# Patient Record
Sex: Male | Born: 1950 | Race: White | Hispanic: No | Marital: Married | Smoking: Former smoker
Health system: Southern US, Community
[De-identification: ages and names within clinical notes are randomized; demographics above are authoritative.]

## PROBLEM LIST (undated history)

## (undated) DIAGNOSIS — C801 Malignant (primary) neoplasm, unspecified: Secondary | ICD-10-CM

## (undated) DIAGNOSIS — F329 Major depressive disorder, single episode, unspecified: Secondary | ICD-10-CM

## (undated) DIAGNOSIS — I4891 Unspecified atrial fibrillation: Secondary | ICD-10-CM

## (undated) DIAGNOSIS — I10 Essential (primary) hypertension: Secondary | ICD-10-CM

## (undated) DIAGNOSIS — F32A Depression, unspecified: Secondary | ICD-10-CM

## (undated) DIAGNOSIS — I499 Cardiac arrhythmia, unspecified: Secondary | ICD-10-CM

---
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
## 2017-02-15 ENCOUNTER — Ambulatory Visit (INDEPENDENT_AMBULATORY_CARE_PROVIDER_SITE_OTHER): Payer: BLUE CROSS/BLUE SHIELD | Admitting: Physician Assistant

## 2017-02-15 ENCOUNTER — Encounter: Payer: Self-pay | Admitting: Physician Assistant

## 2017-02-15 VITALS — BP 177/94 | HR 78 | Temp 98.3°F | Resp 16 | Ht 74.0 in | Wt 178.4 lb

## 2017-02-15 DIAGNOSIS — I10 Essential (primary) hypertension: Secondary | ICD-10-CM | POA: Diagnosis not present

## 2017-02-15 MED ORDER — AMLODIPINE BESYLATE 5 MG PO TABS: 5.0000 mg | ORAL_TABLET | Freq: Every day | ORAL | 0 refills | Status: DC

## 2017-02-15 NOTE — Progress Notes (Signed)
    02/15/2017 3:51 PM   DOB: [birth year] / MRN: [id number]  SUBJECTIVE:  Christopher Harding is a 66 y.o. male presenting for presenting for elevated blood pressure at the request of his dentist who advised he needed treatment Thinks that he has always had a blood pressure problem as an Urgent Care provider advised him that he may need medication. Has been smoking for about 45 years now at 1 pack daily.   He has No Known Allergies.   He  has no past medical history on file.    He  reports that he has been smoking.  He has been smoking about 1.00 pack per day. He has never used smokeless tobacco. He reports that he drinks alcohol. He reports that he does not use drugs. He  has no sexual activity history on file. The patient  has no past surgical history on file.  His family history is not on file.  Review of Systems  Respiratory: Negative for cough and shortness of breath.   Cardiovascular: Negative for chest pain.  Neurological: Negative for dizziness and headaches.    The problem list and medications were reviewed and updated by myself where necessary and exist elsewhere in the encounter.   OBJECTIVE:  BP (!) 177/94 (BP Location: Right Arm, Patient Position: Sitting, Cuff Size: Large)   Pulse 78   Temp 98.3 F (36.8 C) (Oral)   Resp 16   Ht 6\' 2"  (1.88 m)   Wt 178 lb 6.4 oz (80.9 kg)   SpO2 100%   BMI 22.91 kg/m   Physical Exam  Constitutional: He appears well-developed. He is active and cooperative.  Non-toxic appearance.  Cardiovascular: Normal rate, regular rhythm, S1 normal, S2 normal, normal heart sounds, intact distal pulses and normal pulses.  Exam reveals no gallop and no friction rub.   No murmur heard. Pulmonary/Chest: Effort normal. No stridor. No tachypnea. No respiratory distress. He has no wheezes. He has no rales.  Abdominal: He exhibits no distension.  Musculoskeletal: He exhibits no edema.  Neurological: He is alert.  Skin: Skin is warm and dry. He is not  diaphoretic. No pallor.  Psychiatric: His speech is normal and behavior is normal. Judgment and thought content normal. His mood appears anxious. Cognition and memory are normal.  Vitals reviewed.   No results found for this or any previous visit (from the past 72 hour(s)).  No results found.  ASSESSMENT AND PLAN:  Christopher Harding was seen today for hypertension.  Diagnoses and all orders for this visit:  Essential hypertension: Starting 5 of norvasc.  He will likely need a higher dose however this is certainly a good start.  Will see him back in late October for titration, sooner if he pressure is not coming down as his dentist will not let him proceed for a procedure without an improved pressure.  -     Hemoglobin A1c -     CBC -     CMP and Liver -     Lipid Panel -     TSH -     amLODipine (NORVASC) 5 MG tablet; Take 1 tablet (5 mg total) by mouth daily.    The patient is advised to call or return to clinic if he does not see an improvement in symptoms, or to seek the care of the closest emergency department if he worsens with the above plan.   Christopher Harding, MHS, PA-C [facility] 02/15/2017 3:51 PM

## 2017-02-15 NOTE — Patient Instructions (Signed)
     IF you received an x-ray today, you will receive an invoice from [facility]. Please contact [facility] at [phone] with questions or concerns regarding your invoice.   IF you received labwork today, you will receive an invoice from [facility]. Please contact [facility] at [phone] with questions or concerns regarding your invoice.   Our billing staff will not be able to assist you with questions regarding bills from these companies.  You will be contacted with the lab results as soon as they are available. The fastest way to get your results is to activate your My Chart account. Instructions are located on the last page of this paperwork. If you have not heard from us regarding the results in 2 weeks, please contact this office.     

## 2017-02-16 LAB — CBC
Hematocrit: 39.3 % (ref 37.5–51.0)
Hemoglobin: 13.8 g/dL (ref 13.0–17.7)
MCH: 35.3 pg — AB (ref 26.6–33.0)
MCHC: 35.1 g/dL (ref 31.5–35.7)
MCV: 101 fL — ABNORMAL HIGH (ref 79–97)
Platelets: 222 10*3/uL (ref 150–379)
RBC: 3.91 x10E6/uL — ABNORMAL LOW (ref 4.14–5.80)
RDW: 13.6 % (ref 12.3–15.4)
WBC: 6.2 10*3/uL (ref 3.4–10.8)

## 2017-02-16 LAB — CMP AND LIVER
ALT: 9 IU/L (ref 0–44)
AST: 23 IU/L (ref 0–40)
Albumin: 4.4 g/dL (ref 3.6–4.8)
Alkaline Phosphatase: 44 IU/L (ref 39–117)
BILIRUBIN TOTAL: 0.4 mg/dL (ref 0.0–1.2)
BILIRUBIN, DIRECT: 0.14 mg/dL (ref 0.00–0.40)
BUN: 7 mg/dL — AB (ref 8–27)
CALCIUM: 9.1 mg/dL (ref 8.6–10.2)
CHLORIDE: 95 mmol/L — AB (ref 96–106)
CO2: 22 mmol/L (ref 20–29)
Creatinine, Ser: 0.7 mg/dL — ABNORMAL LOW (ref 0.76–1.27)
GFR, EST AFRICAN AMERICAN: 114 mL/min/{1.73_m2} (ref >59)
GFR, EST NON AFRICAN AMERICAN: 98 mL/min/{1.73_m2} (ref >59)
GLUCOSE: 103 mg/dL — AB (ref 65–99)
Potassium: 4.4 mmol/L (ref 3.5–5.2)
Sodium: 134 mmol/L (ref 134–144)
Total Protein: 7.2 g/dL (ref 6.0–8.5)

## 2017-02-16 LAB — HEMOGLOBIN A1C
ESTIMATED AVERAGE GLUCOSE: 94 mg/dL
Hgb A1c MFr Bld: 4.9 % (ref 4.8–5.6)

## 2017-02-16 LAB — TSH: TSH: 1.46 u[IU]/mL (ref 0.450–4.500)

## 2017-02-16 LAB — LIPID PANEL
Chol/HDL Ratio: 1.4 ratio (ref 0.0–5.0)
Cholesterol, Total: 182 mg/dL (ref 100–199)
HDL: 127 mg/dL (ref >39)
LDL Calculated: 44 mg/dL (ref 0–99)
TRIGLYCERIDES: 56 mg/dL (ref 0–149)
VLDL Cholesterol Cal: 11 mg/dL (ref 5–40)

## 2017-04-04 ENCOUNTER — Ambulatory Visit (INDEPENDENT_AMBULATORY_CARE_PROVIDER_SITE_OTHER): Payer: BLUE CROSS/BLUE SHIELD | Admitting: Physician Assistant

## 2017-04-04 ENCOUNTER — Encounter: Payer: Self-pay | Admitting: Physician Assistant

## 2017-04-04 VITALS — BP 146/68 | HR 79 | Temp 98.6°F | Resp 16 | Ht 74.0 in | Wt 177.0 lb

## 2017-04-04 DIAGNOSIS — Z23 Encounter for immunization: Secondary | ICD-10-CM

## 2017-04-04 DIAGNOSIS — Z1211 Encounter for screening for malignant neoplasm of colon: Secondary | ICD-10-CM

## 2017-04-04 DIAGNOSIS — I10 Essential (primary) hypertension: Secondary | ICD-10-CM

## 2017-04-04 MED ORDER — AMLODIPINE BESY-BENAZEPRIL HCL 5-10 MG PO CAPS: 1.0000 | ORAL_CAPSULE | Freq: Every day | ORAL | 1 refills | Status: DC

## 2017-04-04 NOTE — Progress Notes (Signed)
04/04/2017 4:02 PM   DOB: July 27, 1950 / MRN: [id number]  SUBJECTIVE:  Christopher Harding is a 66 y.o. male presenting for BP recheck. Has been taking norvasc 5 mg and denies any new leg swelling.  Tells me his pressure is much better. He does not check it outside of medical appointments. He feels well today. Would like a flu shot today. Needs a tdap.     He has No Known Allergies.   He  has no past medical history on file.    He  reports that he has been smoking.  He has a 35.00 pack-year smoking history. He has never used smokeless tobacco. He reports that he drinks alcohol. He reports that he does not use drugs. He  has no sexual activity history on file. The patient  has no past surgical history on file.  His family history is not on file.  Review of Systems  Constitutional: Negative for chills, diaphoresis and fever.  Eyes: Negative.   Respiratory: Negative for cough, hemoptysis, sputum production, shortness of breath and wheezing.   Cardiovascular: Negative for chest pain, orthopnea and leg swelling.  Gastrointestinal: Negative for nausea.  Skin: Negative for rash.  Neurological: Negative for dizziness, sensory change, speech change, focal weakness and headaches.    The problem list and medications were reviewed and updated by myself where necessary and exist elsewhere in the encounter.   OBJECTIVE:  BP (!) 146/68 (BP Location: Right Arm, Patient Position: Sitting, Cuff Size: Normal)   Pulse 79   Temp 98.6 F (37 C) (Oral)   Resp 16   Ht 6\' 2"  (1.88 m)   Wt 177 lb (80.3 kg)   SpO2 99%   BMI 22.73 kg/m   Physical Exam  Constitutional: He is oriented to person, place, and time. He appears well-developed. He is active and cooperative.  Non-toxic appearance.  Cardiovascular: Normal rate, regular rhythm, S1 normal, S2 normal, normal heart sounds, intact distal pulses and normal pulses.  Exam reveals no gallop and no friction rub.   No murmur heard. Pulmonary/Chest:  Effort normal. No stridor. No tachypnea. No respiratory distress. He has no wheezes. He has no rales.  Abdominal: He exhibits no distension.  Musculoskeletal: He exhibits no edema.  Neurological: He is alert and oriented to person, place, and time.  Skin: Skin is warm and dry. He is not diaphoretic. No pallor.  Vitals reviewed.   BP Readings from Last 3 Encounters:  04/04/17 (!) 146/68  02/15/17 (!) 177/94   Lab Results  Component Value Date   WBC 6.2 02/15/2017   HGB 13.8 02/15/2017   HCT 39.3 02/15/2017   MCV 101 (H) 02/15/2017   PLT 222 02/15/2017    Lab Results  Component Value Date   CREATININE 0.70 (L) 02/15/2017   BUN 7 (L) 02/15/2017   NA 134 02/15/2017   K 4.4 02/15/2017   CL 95 (L) 02/15/2017   CO2 22 02/15/2017    Lab Results  Component Value Date   ALT 9 02/15/2017   AST 23 02/15/2017   ALKPHOS 44 02/15/2017   BILITOT 0.4 02/15/2017    Lab Results  Component Value Date   TSH 1.460 02/15/2017    Lab Results  Component Value Date   HGBA1C 4.9 02/15/2017    Lab Results  Component Value Date   CHOL 182 02/15/2017   HDL 127 02/15/2017   LDLCALC 44 02/15/2017   TRIG 56 02/15/2017   CHOLHDL 1.4 02/15/2017  No results found for this or any previous visit (from the past 72 hour(s)).  No results found.  ASSESSMENT AND PLAN:  Terell was seen today for hypertension.  Diagnoses and all orders for this visit:  Essential hypertension: Adding a low dose of a second agent.  Will see him back in 6 months.  Will plan to start pneumonia series at that time and will plan to discuss low dose CT scan screening at that time.  -     amLODipine-benazepril (LOTREL) 5-10 MG capsule; Take 1 capsule by mouth daily.  Special screening for malignant neoplasms, colon -     Cologuard  Need for prophylactic vaccination and inoculation against influenza -     Flu Vaccine QUAD 36+ mos IM  Need for diphtheria-tetanus-pertussis (Tdap) vaccine -     Tdap vaccine  greater than or equal to 7yo IM    The patient is advised to call or return to clinic if he does not see an improvement in symptoms, or to seek the care of the closest emergency department if he worsens with the above plan.   Philis Fendt, MHS, PA-C [facility] 04/04/2017 4:02 PM

## 2017-04-04 NOTE — Patient Instructions (Signed)
     IF you received an x-ray today, you will receive an invoice from [facility]. Please contact [facility] at [phone] with questions or concerns regarding your invoice.   IF you received labwork today, you will receive an invoice from [facility]. Please contact [facility] at [phone] with questions or concerns regarding your invoice.   Our billing staff will not be able to assist you with questions regarding bills from these companies.  You will be contacted with the lab results as soon as they are available. The fastest way to get your results is to activate your My Chart account. Instructions are located on the last page of this paperwork. If you have not heard from us regarding the results in 2 weeks, please contact this office.     

## 2017-10-03 ENCOUNTER — Encounter: Payer: Self-pay | Admitting: Physician Assistant

## 2017-10-03 ENCOUNTER — Ambulatory Visit (INDEPENDENT_AMBULATORY_CARE_PROVIDER_SITE_OTHER): Payer: BLUE CROSS/BLUE SHIELD | Admitting: Physician Assistant

## 2017-10-03 ENCOUNTER — Other Ambulatory Visit: Payer: Self-pay

## 2017-10-03 VITALS — BP 126/74 | HR 69 | Temp 98.1°F | Resp 16 | Ht 71.0 in | Wt 172.0 lb

## 2017-10-03 DIAGNOSIS — I10 Essential (primary) hypertension: Secondary | ICD-10-CM

## 2017-10-03 DIAGNOSIS — F17210 Nicotine dependence, cigarettes, uncomplicated: Secondary | ICD-10-CM | POA: Diagnosis not present

## 2017-10-03 DIAGNOSIS — Z23 Encounter for immunization: Secondary | ICD-10-CM

## 2017-10-03 MED ORDER — AMLODIPINE BESY-BENAZEPRIL HCL 2.5-10 MG PO CAPS
1.0000 | ORAL_CAPSULE | Freq: Every day | ORAL | 0 refills | Status: DC
Start: 2017-10-03 — End: 2017-10-25

## 2017-10-03 NOTE — Patient Instructions (Addendum)
  BP Readings from Last 3 Encounters:  10/03/17 126/74  04/04/17 (!) 146/68  02/15/17 (!) 177/94      IF you received an x-ray today, you will receive an invoice from [facility]. Please contact [facility] at [phone] with questions or concerns regarding your invoice.   IF you received labwork today, you will receive an invoice from [facility]. Please contact [facility] at [phone] with questions or concerns regarding your invoice.   Our billing staff will not be able to assist you with questions regarding bills from these companies.  You will be contacted with the lab results as soon as they are available. The fastest way to get your results is to activate your My Chart account. Instructions are located on the last page of this paperwork. If you have not heard from [place] regarding the results in 2 weeks, please contact this office.

## 2017-10-03 NOTE — Progress Notes (Signed)
10/05/2017 8:11 AM   DOB: [birth year] / MRN: [id number]  SUBJECTIVE:  Christopher Harding is a 67 y.o. male presenting for follow-up of hypertension.  He is taking the medication daily and does not miss doses however states that "I am constantly exhausted."  He is retired, enjoys spending time with his family, sleeps well, and feels happy.  Denies any financial concerns.  He is worried his fatigue may represent an occult process but also tells me that the fatigue really started once we increased his blood pressure medicine.  He would like to try reducing the dose and following up in about 3 weeks to recheck his numbers and recheck the fatigue.  Immunization History  Administered Date(s) Administered  . Influenza,inj,Quad PF,6+ Mos 04/04/2017  . Pneumococcal Conjugate-13 10/03/2017  . Tdap 04/04/2017     He has No Known Allergies.   He  has no past medical history on file.    He  reports that he has been smoking.  He has a 35.00 pack-year smoking history. He has never used smokeless tobacco. He reports that he drinks alcohol. He reports that he does not use drugs. He  has no sexual activity history on file. The patient  has no past surgical history on file.  His family history is not on file.  Review of Systems  Constitutional: Positive for malaise/fatigue. Negative for chills, diaphoresis and fever.  Eyes: Negative.   Respiratory: Negative for cough, hemoptysis, sputum production, shortness of breath and wheezing.   Cardiovascular: Negative for chest pain, orthopnea and leg swelling.  Gastrointestinal: Negative for abdominal pain, blood in stool, constipation, diarrhea, heartburn, melena, nausea and vomiting.  Genitourinary: Negative for dysuria, flank pain, frequency, hematuria and urgency.  Skin: Negative for rash.  Neurological: Negative for dizziness, sensory change, speech change, focal weakness and headaches.    The problem list and medications were reviewed and updated by myself  where necessary and exist elsewhere in the encounter.   OBJECTIVE:  BP 126/74 (BP Location: Left Arm, Patient Position: Sitting, Cuff Size: Normal)   Pulse 69   Temp 98.1 F (36.7 C) (Oral)   Resp 16   Ht 5\' 11"  (1.803 m)   Wt 172 lb (78 kg)   SpO2 100%   BMI 23.99 kg/m   Wt Readings from Last 3 Encounters:  10/03/17 172 lb (78 kg)  04/04/17 177 lb (80.3 kg)  02/15/17 178 lb 6.4 oz (80.9 kg)     Physical Exam  Constitutional: He is oriented to person, place, and time. He appears well-developed. He does not appear ill.  Eyes: Pupils are equal, round, and reactive to light. Conjunctivae and EOM are normal.  Cardiovascular: Normal rate, regular rhythm, S1 normal, S2 normal, normal heart sounds, intact distal pulses and normal pulses. Exam reveals no gallop and no friction rub.  No murmur heard. Pulmonary/Chest: Effort normal. No stridor. No respiratory distress. He has no wheezes. He has no rales.  Abdominal: Soft. Normal appearance and bowel sounds are normal. He exhibits no distension and no mass. There is no tenderness. There is no rigidity, no rebound, no guarding and no CVA tenderness. No hernia.  Musculoskeletal: Normal range of motion. He exhibits no edema.  Neurological: He is alert and oriented to person, place, and time. No cranial nerve deficit. Coordination normal.  Skin: Skin is warm and dry. He is not diaphoretic.  Psychiatric: He has a normal mood and affect.  Nursing note and vitals reviewed.   Lab Results  Component Value Date   WBC 6.2 02/15/2017   HGB 13.8 02/15/2017   HCT 39.3 02/15/2017   MCV 101 (H) 02/15/2017   PLT 222 02/15/2017    Lab Results  Component Value Date   CREATININE 0.74 (L) 10/03/2017   BUN 7 (L) 10/03/2017   NA 134 10/03/2017   K 4.3 10/03/2017   CL 99 10/03/2017   CO2 22 10/03/2017    Lab Results  Component Value Date   ALT 9 02/15/2017   AST 23 02/15/2017   ALKPHOS 44 02/15/2017   BILITOT 0.4 02/15/2017    Lab Results   Component Value Date   TSH 1.460 02/15/2017    Lab Results  Component Value Date   HGBA1C 4.9 02/15/2017    Lab Results  Component Value Date   CHOL 182 02/15/2017   HDL 127 02/15/2017   LDLCALC 44 02/15/2017   TRIG 56 02/15/2017   CHOLHDL 1.4 02/15/2017     Results for orders placed or performed in visit on 10/03/17 (from the past 72 hour(s))  Renal Function Panel     Status: Abnormal   Collection Time: 10/03/17  4:50 PM  Result Value Ref Range   Glucose 94 65 - 99 mg/dL   BUN 7 (L) 8 - 27 mg/dL   Creatinine, Ser 0.74 (L) 0.76 - 1.27 mg/dL   GFR calc non Af Amer 95 >59 mL/min/1.73   GFR calc Af Amer 110 >59 mL/min/1.73   BUN/Creatinine Ratio 9 (L) 10 - 24   Sodium 134 134 - 144 mmol/L   Potassium 4.3 3.5 - 5.2 mmol/L   Chloride 99 96 - 106 mmol/L   CO2 22 20 - 29 mmol/L   Calcium 9.4 8.6 - 10.2 mg/dL   Phosphorus 3.6 2.5 - 4.5 mg/dL   Albumin 4.4 3.6 - 4.8 g/dL    No results found.  ASSESSMENT AND PLAN:  Christopher Harding was seen today for hypertension.  Diagnoses and all orders for this visit:  Well-controlled hypertension: Patient suffering with some daily fatigue which very well could be medication related.  I will drop his Lotrel to half of the current dose.  He will come back in about 3 weeks for a recheck of blood pressure and fatigue levels.  EKG today is reassuring and normal sinus rhythm with no major abnormality.  I have ordered his screenings that are related to his long history of smoking today.  I would like these to occur on the same day if possible for his convenience. -     Cologuard -     EKG 12-Lead -     amlodipine-benazepril (LOTREL) 2.5-10 MG capsule; Take 1 capsule by mouth daily. -     Renal Function Panel  Smokes less than 1 pack a day with greater than 30 pack year history -     CT Chest Wo Contrast; Future -     US ABDOMINAL AORTA SCREENING AAA; Future  Need for prophylactic vaccination against Streptococcus pneumoniae (pneumococcus) -      Pneumococcal conjugate vaccine 13-valent IM    The patient is advised to call or return to clinic if he does not see an improvement in symptoms, or to seek the care of the closest emergency department if he worsens with the above plan.   Philis Fendt, MHS, PA-C [facility] 10/05/2017 8:11 AM

## 2017-10-04 LAB — RENAL FUNCTION PANEL
ALBUMIN: 4.4 g/dL (ref 3.6–4.8)
BUN / CREAT RATIO: 9 — AB (ref 10–24)
BUN: 7 mg/dL — ABNORMAL LOW (ref 8–27)
CO2: 22 mmol/L (ref 20–29)
CREATININE: 0.74 mg/dL — AB (ref 0.76–1.27)
Calcium: 9.4 mg/dL (ref 8.6–10.2)
Chloride: 99 mmol/L (ref 96–106)
GFR calc non Af Amer: 95 mL/min/{1.73_m2} (ref >59)
GFR, EST AFRICAN AMERICAN: 110 mL/min/{1.73_m2} (ref >59)
Glucose: 94 mg/dL (ref 65–99)
Phosphorus: 3.6 mg/dL (ref 2.5–4.5)
Potassium: 4.3 mmol/L (ref 3.5–5.2)
Sodium: 134 mmol/L (ref 134–144)

## 2017-10-08 ENCOUNTER — Other Ambulatory Visit: Payer: Self-pay | Admitting: Physician Assistant

## 2017-10-08 DIAGNOSIS — I10 Essential (primary) hypertension: Secondary | ICD-10-CM

## 2017-10-12 ENCOUNTER — Telehealth: Payer: Self-pay | Admitting: Physician Assistant

## 2017-10-12 NOTE — Telephone Encounter (Signed)
The cpt code for the ct exam needs to be changed to G0297 for lung cancer screening instead of 12162   Thank you

## 2017-10-12 NOTE — Telephone Encounter (Signed)
Eddie Dibbles can you please attend to this. I don't place CPT codes.  I tried to change the study to the specified however that did not help. Philis Fendt, MS, PA-C 11:15 AM, 10/12/2017

## 2017-10-24 ENCOUNTER — Ambulatory Visit (INDEPENDENT_AMBULATORY_CARE_PROVIDER_SITE_OTHER): Payer: BLUE CROSS/BLUE SHIELD | Admitting: Physician Assistant

## 2017-10-24 ENCOUNTER — Telehealth: Payer: Self-pay | Admitting: Physician Assistant

## 2017-10-24 ENCOUNTER — Encounter: Payer: Self-pay | Admitting: Physician Assistant

## 2017-10-24 ENCOUNTER — Other Ambulatory Visit: Payer: Self-pay

## 2017-10-24 DIAGNOSIS — I10 Essential (primary) hypertension: Secondary | ICD-10-CM | POA: Diagnosis not present

## 2017-10-24 NOTE — Progress Notes (Signed)
10/25/2017 4:46 PM   DOB: Aug 29, 1950 / MRN: [id number]  SUBJECTIVE:  Christopher Harding is a 67 y.o. male presenting for recheck HTN.  He feels well today.  Lowering his BP dose has relieved 60-70% of his fatigue.  No chest pain, SOB, leg swelling.   He has No Known Allergies.   He  has no past medical history on file.    He  reports that he has been smoking.  He has a 35.00 pack-year smoking history. He has never used smokeless tobacco. He reports that he drinks alcohol. He reports that he does not use drugs. He  has no sexual activity history on file. The patient  has no past surgical history on file.  His family history is not on file.  Review of Systems  Constitutional: Negative for chills, diaphoresis and fever.  Respiratory: Negative for shortness of breath.   Cardiovascular: Negative for chest pain, orthopnea and leg swelling.  Gastrointestinal: Negative for nausea.  Skin: Negative for rash.  Neurological: Negative for dizziness.    The problem list and medications were reviewed and updated by myself where necessary and exist elsewhere in the encounter.   OBJECTIVE:  BP 138/68   Pulse 85   Temp 98.6 F (37 C)   Resp 16   Ht 5\' 11"  (1.803 m)   Wt 173 lb 6.4 oz (78.7 kg)   SpO2 97%   BMI 24.18 kg/m   Lab Results  Component Value Date   WBC 6.2 02/15/2017   HGB 13.8 02/15/2017   HCT 39.3 02/15/2017   MCV 101 (H) 02/15/2017   PLT 222 02/15/2017    Lab Results  Component Value Date   CREATININE 0.74 (L) 10/03/2017   BUN 7 (L) 10/03/2017   NA 134 10/03/2017   K 4.3 10/03/2017   CL 99 10/03/2017   CO2 22 10/03/2017    Lab Results  Component Value Date   ALT 9 02/15/2017   AST 23 02/15/2017   ALKPHOS 44 02/15/2017   BILITOT 0.4 02/15/2017    Lab Results  Component Value Date   TSH 1.460 02/15/2017    Lab Results  Component Value Date   HGBA1C 4.9 02/15/2017    Lab Results  Component Value Date   CHOL 182 02/15/2017   HDL 127 02/15/2017   LDLCALC 44 02/15/2017   TRIG 56 02/15/2017   CHOLHDL 1.4 02/15/2017     Physical Exam  Constitutional: He is oriented to person, place, and time. He appears well-developed. He is active.  Non-toxic appearance. He does not appear ill.  Eyes: Pupils are equal, round, and reactive to light. Conjunctivae and EOM are normal.  Cardiovascular: Normal rate, regular rhythm, S1 normal, S2 normal, normal heart sounds, intact distal pulses and normal pulses. Exam reveals no gallop and no friction rub.  No murmur heard. Pulmonary/Chest: Effort normal. No stridor. No respiratory distress. He has no wheezes. He has no rales.  Abdominal: He exhibits no distension.  Musculoskeletal: Normal range of motion. He exhibits no edema.  Neurological: He is alert and oriented to person, place, and time. No cranial nerve deficit. Coordination normal.  Skin: Skin is warm and dry. He is not diaphoretic. No pallor.  Psychiatric: He has a normal mood and affect.  Nursing note and vitals reviewed.   No results found for this or any previous visit (from the past 72 hour(s)).  No results found.  ASSESSMENT AND PLAN:  Thomes was seen today for follow-up.  Diagnoses  and all orders for this visit:  Well-controlled hypertension: Patient with improved symptoms of fatigue with lowering his dose of BP med to the current.  Will maintain lower dose.  He will come back for a recheck in about 6 months.  -     amlodipine-benazepril (LOTREL) 2.5-10 MG capsule; Take 1 capsule by mouth daily.    The patient is advised to call or return to clinic if he does not see an improvement in symptoms, or to seek the care of the closest emergency department if he worsens with the above plan.   Philis Fendt, MHS, PA-C [facility] 10/25/2017 4:46 PM

## 2017-10-24 NOTE — Patient Instructions (Signed)
     IF you received an x-ray today, you will receive an invoice from [facility]. Please contact [facility] at [phone] with questions or concerns regarding your invoice.   IF you received labwork today, you will receive an invoice from [facility]. Please contact [facility] at [phone] with questions or concerns regarding your invoice.   Our billing staff will not be able to assist you with questions regarding bills from these companies.  You will be contacted with the lab results as soon as they are available. The fastest way to get your results is to activate your My Chart account. Instructions are located on the last page of this paperwork. If you have not heard from us regarding the results in 2 weeks, please contact this office.     

## 2017-10-24 NOTE — Telephone Encounter (Signed)
Copied from [place]. Topic: Quick Communication - Rx Refill/Question >> Oct 24, 2017  4:04 PM Margot Ables wrote: Medication: amlodipine - pt requesting 90 day supply to be sent in - he met with Philis Fendt, PA today Preferred Pharmacy (with phone number or street name): [facility] [place], [place] AT [address] [phone] (Phone) [phone] (Fax)

## 2017-10-24 NOTE — Progress Notes (Deleted)
BP Readings from Last 3 Encounters:  10/24/17 (!) 148/68  10/03/17 126/74  04/04/17 (!) 146/68

## 2017-10-25 MED ORDER — AMLODIPINE BESY-BENAZEPRIL HCL 2.5-10 MG PO CAPS: 1.0000 | ORAL_CAPSULE | Freq: Every day | ORAL | 3 refills | Status: DC

## 2017-10-25 NOTE — Telephone Encounter (Signed)
I've reordered. Thanks John. Philis Fendt, MS, PA-C 5:19 PM, 10/25/2017

## 2017-10-25 NOTE — Telephone Encounter (Signed)
Note incomplete from [place] please advise on refill.

## 2017-10-26 ENCOUNTER — Ambulatory Visit: Payer: Self-pay

## 2017-11-08 ENCOUNTER — Ambulatory Visit: Payer: Self-pay

## 2017-11-08 ENCOUNTER — Inpatient Hospital Stay
Admission: RE | Admit: 2017-11-08 | Discharge: 2017-11-08 | Disposition: A | Discharge status: Home / Self Care | Payer: Self-pay | Source: Ambulatory Visit | Attending: Physician Assistant | Admitting: Physician Assistant

## 2017-11-19 ENCOUNTER — Telehealth: Payer: Self-pay | Admitting: Family Medicine

## 2017-11-19 NOTE — Telephone Encounter (Signed)
LDCT for lung cancer screening has been approved through [employer] effective 10/12/17-01/10/18  Authorization: [id number] Reference: [id number]

## 2018-01-25 ENCOUNTER — Other Ambulatory Visit: Payer: Self-pay

## 2018-01-25 ENCOUNTER — Encounter: Payer: Self-pay | Admitting: Physician Assistant

## 2018-01-25 ENCOUNTER — Ambulatory Visit (INDEPENDENT_AMBULATORY_CARE_PROVIDER_SITE_OTHER): Payer: BLUE CROSS/BLUE SHIELD | Admitting: Physician Assistant

## 2018-01-25 VITALS — BP 138/78 | HR 66 | Temp 98.2°F | Resp 16 | Ht 74.02 in | Wt 173.0 lb

## 2018-01-25 DIAGNOSIS — I10 Essential (primary) hypertension: Secondary | ICD-10-CM | POA: Diagnosis not present

## 2018-01-25 DIAGNOSIS — D7589 Other specified diseases of blood and blood-forming organs: Secondary | ICD-10-CM

## 2018-01-25 DIAGNOSIS — Z1211 Encounter for screening for malignant neoplasm of colon: Secondary | ICD-10-CM | POA: Diagnosis not present

## 2018-01-25 NOTE — Progress Notes (Signed)
01/25/2018 4:25 PM   DOB: Feb 01, 1951 / MRN: [id number]  SUBJECTIVE:  Christopher Harding is a 67 y.o. male presenting for recheck HTN. He takes medication daily without missing doses. He feels well today but is upset because he needs cataract surgery bilaterally. He continues to smoke and has no desire to quit.   He has No Known Allergies.   He  has no past medical history on file.    He  reports that he has been smoking. He has a 35.00 pack-year smoking history. He has never used smokeless tobacco. He reports that he drinks alcohol. He reports that he does not use drugs. He  has no sexual activity history on file. The patient  has no past surgical history on file.  His family history is not on file.  Review of Systems  Constitutional: Negative for chills, diaphoresis and fever.  Eyes: Negative.   Respiratory: Negative for cough, hemoptysis, sputum production, shortness of breath and wheezing.   Cardiovascular: Negative for chest pain, orthopnea and leg swelling.  Gastrointestinal: Negative for nausea.  Skin: Negative for rash.  Neurological: Negative for dizziness, sensory change, speech change, focal weakness and headaches.    The problem list and medications were reviewed and updated by myself where necessary and exist elsewhere in the encounter.   OBJECTIVE:  BP 138/78   Pulse 66   Temp 98.2 F (36.8 C) (Oral)   Resp 16   Ht 6' 2.02" (1.88 m)   Wt 173 lb (78.5 kg)   SpO2 100%   BMI 22.20 kg/m   Wt Readings from Last 3 Encounters:  01/25/18 173 lb (78.5 kg)  10/24/17 173 lb 6.4 oz (78.7 kg)  10/03/17 172 lb (78 kg)   Temp Readings from Last 3 Encounters:  01/25/18 98.2 F (36.8 C) (Oral)  10/24/17 98.6 F (37 C)  10/03/17 98.1 F (36.7 C) (Oral)   BP Readings from Last 3 Encounters:  01/25/18 138/78  10/24/17 138/68  10/03/17 126/74   Pulse Readings from Last 3 Encounters:  01/25/18 66  10/24/17 85  10/03/17 69    Physical Exam  Constitutional: He is  oriented to person, place, and time. He appears well-developed. He does not appear ill.  Eyes: Pupils are equal, round, and reactive to light. Conjunctivae and EOM are normal.  Cardiovascular: Normal rate, regular rhythm, S1 normal, S2 normal, normal heart sounds, intact distal pulses and normal pulses. Exam reveals no gallop and no friction rub.  No murmur heard. Pulmonary/Chest: Effort normal. No stridor. No respiratory distress. He has no wheezes. He has no rales.  Abdominal: He exhibits no distension.  Musculoskeletal: Normal range of motion. He exhibits no edema.  Neurological: He is alert and oriented to person, place, and time. No cranial nerve deficit. Coordination normal.  Skin: Skin is warm and dry. He is not diaphoretic.  Psychiatric: He has a normal mood and affect.  Nursing note and vitals reviewed.   Lab Results  Component Value Date   HGBA1C 4.9 02/15/2017    Lab Results  Component Value Date   WBC 6.2 02/15/2017   HGB 13.8 02/15/2017   HCT 39.3 02/15/2017   MCV 101 (H) 02/15/2017   PLT 222 02/15/2017    Lab Results  Component Value Date   CREATININE 0.74 (L) 10/03/2017   BUN 7 (L) 10/03/2017   NA 134 10/03/2017   K 4.3 10/03/2017   CL 99 10/03/2017   CO2 22 10/03/2017    Lab Results  Component Value Date   ALT 9 02/15/2017   AST 23 02/15/2017   ALKPHOS 44 02/15/2017   BILITOT 0.4 02/15/2017    Lab Results  Component Value Date   TSH 1.460 02/15/2017    Lab Results  Component Value Date   CHOL 182 02/15/2017   HDL 127 02/15/2017   LDLCALC 44 02/15/2017   TRIG 56 02/15/2017   CHOLHDL 1.4 02/15/2017     ASSESSMENT AND PLAN:  Christopher Harding was seen today for hypertension.  Diagnoses and all orders for this visit:  Well-controlled hypertension: RTC in about 6 months to see Dr. Carlota Raspberry.  -     CBC  Special screening for malignant neoplasms, colon -     Cologuard  Macrocytosis without anemia -     Pathologist smear review -     Vitamin B12 -      Folate    The patient is advised to call or return to clinic if he does not see an improvement in symptoms, or to seek the care of the closest emergency department if he worsens with the above plan.   Philis Fendt, MHS, PA-C [facility] 01/25/2018 4:25 PM

## 2018-01-25 NOTE — Patient Instructions (Addendum)
  You have enough medication to last at least six anouther 9 months.  Please come back and see Dr. Carlota Raspberry in about 6 months.    If you have lab work done today you will be contacted with your lab results within the next 2 weeks.  If you have not heard from [place] then please contact us. The fastest way to get your results is to register for My Chart.   IF you received an x-ray today, you will receive an invoice from [facility]. Please contact [facility] at [phone] with questions or concerns regarding your invoice.   IF you received labwork today, you will receive an invoice from [place]. Please contact [facility] at [phone] with questions or concerns regarding your invoice.   Our billing staff will not be able to assist you with questions regarding bills from these companies.  You will be contacted with the lab results as soon as they are available. The fastest way to get your results is to activate your My Chart account. Instructions are located on the last page of this paperwork. If you have not heard from [place] regarding the results in 2 weeks, please contact this office.

## 2018-01-26 LAB — CBC
HEMATOCRIT: 40.3 % (ref 37.5–51.0)
HEMOGLOBIN: 14.3 g/dL (ref 13.0–17.7)
MCH: 35.2 pg — ABNORMAL HIGH (ref 26.6–33.0)
MCHC: 35.5 g/dL (ref 31.5–35.7)
MCV: 99 fL — ABNORMAL HIGH (ref 79–97)
Platelets: 241 10*3/uL (ref 150–450)
RBC: 4.06 x10E6/uL — AB (ref 4.14–5.80)
RDW: 12.5 % (ref 12.3–15.4)
WBC: 5.8 10*3/uL (ref 3.4–10.8)

## 2018-01-26 LAB — VITAMIN B12: Vitamin B-12: 475 pg/mL (ref 232–1245)

## 2018-01-26 LAB — FOLATE: FOLATE: 16 ng/mL (ref >3.0)

## 2018-01-31 LAB — PATHOLOGIST SMEAR REVIEW
BASOS ABS: 0 10*3/uL (ref 0.0–0.2)
Basos: 1 %
EOS (ABSOLUTE): 0 10*3/uL (ref 0.0–0.4)
Eos: 1 %
HEMATOCRIT: 41 % (ref 37.5–51.0)
HEMOGLOBIN: 14.1 g/dL (ref 13.0–17.7)
IMMATURE GRANS (ABS): 0 10*3/uL (ref 0.0–0.1)
IMMATURE GRANULOCYTES: 0 %
LYMPHS ABS: 1.5 10*3/uL (ref 0.7–3.1)
LYMPHS: 24 %
MCH: 34.9 pg — ABNORMAL HIGH (ref 26.6–33.0)
MCHC: 34.4 g/dL (ref 31.5–35.7)
MCV: 102 fL — AB (ref 79–97)
Monocytes Absolute: 0.8 10*3/uL (ref 0.1–0.9)
Monocytes: 13 %
NEUTROS PCT: 61 %
Neutrophils Absolute: 3.8 10*3/uL (ref 1.4–7.0)
PLATELETS: 244 10*3/uL (ref 150–450)
Path Rev PLTs: NORMAL
Path Rev WBC: NORMAL
RBC: 4.04 x10E6/uL — ABNORMAL LOW (ref 4.14–5.80)
RDW: 13.5 % (ref 12.3–15.4)
WBC: 6.2 10*3/uL (ref 3.4–10.8)

## 2018-05-18 ENCOUNTER — Telehealth: Payer: Self-pay | Admitting: Family Medicine

## 2018-05-18 NOTE — Telephone Encounter (Signed)
Called and spoke with pt regarding their appt with Dr. Carlota Raspberry on 08/01/18. Due to providers schedule change, pt will need to be rescheduled. Pt was unable to reschedule due to being at a job site. Pt states that he will call back to reschedule. Thank you!

## 2018-08-01 ENCOUNTER — Ambulatory Visit: Payer: BLUE CROSS/BLUE SHIELD | Admitting: Family Medicine

## 2018-10-13 ENCOUNTER — Telehealth: Payer: Self-pay | Admitting: Cardiology

## 2018-10-13 ENCOUNTER — Telehealth: Payer: Self-pay | Admitting: General Practice

## 2018-10-13 NOTE — Telephone Encounter (Signed)
LMTCB to schedule new patient appt with Dr. Percival Spanish

## 2018-10-13 NOTE — Telephone Encounter (Signed)
Lmtcb. New patient.Referral in workqueue.  Please schedule with Dr Percival Spanish week of May 13 possible morning of 16 if possible. Thanks/dc

## 2018-10-25 NOTE — Telephone Encounter (Signed)
LMTCB to schedule new patient appt with Dr. Percival Spanish, offer patient virtual visit for next week.

## 2018-11-01 ENCOUNTER — Telehealth: Payer: Self-pay | Admitting: *Deleted

## 2018-11-01 ENCOUNTER — Other Ambulatory Visit: Payer: Self-pay | Admitting: Surgery

## 2018-11-01 NOTE — Telephone Encounter (Signed)
   [facility] Pre-operative Risk Assessment    Request for surgical clearance:  1. What type of surgery is being performed? Inguinal hernia repair  2. When is this surgery scheduled? TBD  3. What type of clearance is required (medical clearance vs. Pharmacy clearance to hold med vs. Both)? both  4. Are there any medications that need to be held prior to surgery and how long?xarelto-please advise  5. Practice name and name of physician performing surgery? Douglas blackman md  6. What is your office phone number [phone]   7.   What is your office fax number  [phone] attn chemira jones cma  8.   Anesthesia type (None, local, MAC, general) ? general   Christopher Harding 11/01/2018, 2:49 PM  _________________________________________________________________   (provider comments below)

## 2018-11-01 NOTE — Telephone Encounter (Signed)
   Primary Cardiologist: will be Dr. Percival Spanish  Chart reviewed as part of pre-operative protocol coverage.   It does not appear that [facility] has every seen this patient. It appears that he is scheduled for a new patient visit with Dr. Percival Spanish in July.  I attempted to call the patient and left a voicemail to relay that we can't provide surgical clearance until after his surgery.   Dr. Percival Spanish - please comment on anticoagulation and send clearance after his appt.   Pre-op covering staff: - Please contact requesting surgeon's office via preferred method (i.e, phone, fax) to inform them of the above   Ledora Bottcher, [place] 11/01/2018, 3:40 PM

## 2018-11-05 NOTE — Telephone Encounter (Signed)
If he needs a sooner appt he can be set up with another provider.

## 2018-11-07 NOTE — Telephone Encounter (Signed)
Pt have appt with Dr Gwenlyn Found 06/18

## 2018-11-17 ENCOUNTER — Telehealth: Payer: Self-pay | Admitting: Cardiovascular Disease

## 2018-11-20 ENCOUNTER — Telehealth: Payer: Self-pay | Admitting: Cardiovascular Disease

## 2018-11-20 NOTE — Telephone Encounter (Signed)
smartphone/ consent/ my chart declined/ pre reg completed

## 2018-11-23 ENCOUNTER — Telehealth (INDEPENDENT_AMBULATORY_CARE_PROVIDER_SITE_OTHER): Payer: 59 | Admitting: Cardiovascular Disease

## 2018-11-23 ENCOUNTER — Telehealth: Payer: Self-pay

## 2018-11-23 ENCOUNTER — Telehealth: Payer: Self-pay | Admitting: Cardiovascular Disease

## 2018-11-23 VITALS — Ht 74.0 in | Wt 166.0 lb

## 2018-11-23 DIAGNOSIS — I4819 Other persistent atrial fibrillation: Secondary | ICD-10-CM

## 2018-11-23 DIAGNOSIS — F1010 Alcohol abuse, uncomplicated: Secondary | ICD-10-CM | POA: Insufficient documentation

## 2018-11-23 DIAGNOSIS — Z720 Tobacco use: Secondary | ICD-10-CM

## 2018-11-23 DIAGNOSIS — I10 Essential (primary) hypertension: Secondary | ICD-10-CM | POA: Diagnosis not present

## 2018-11-23 DIAGNOSIS — Z01812 Encounter for preprocedural laboratory examination: Secondary | ICD-10-CM

## 2018-11-23 HISTORY — DX: Tobacco use: Z72.0

## 2018-11-23 HISTORY — DX: Other persistent atrial fibrillation: I48.19

## 2018-11-23 NOTE — Patient Instructions (Addendum)
Dear Christopher Harding, You are scheduled for a Cardioversion on Thursday December 07, 2018 with Dr. Jenkins Rouge.  Please arrive at the [facility] (Main Entrance A) at [facility]: [address] [place], [place] [address] at 12:45 pm. (1 hour prior to procedure unless lab work is needed; if lab work is needed arrive 1.5 hours ahead)  DIET: Nothing to eat or drink after midnight except a sip of water with medications (see medication instructions below)  Medication Instructions: Hold metoprolol (Lopressor) on the day of your cardioversion.  Continue your anticoagulant: Xarelto You will need to continue your anticoagulant after your procedure until you  are told by your Provider that it is safe to stop   Labs: You will need lab work 3-7 days prior to your cardioversion. You will need a COVID-19 test 3 days prior to your cardioversion.  Go to:  HeartCare at [employer] [facility] [address], [place], [place] [address] FOR YOUR BASIC METABOLIC PANEL and COMPLETE BLOOD COUNT LAB WORK. NO APPOINTMENT IS NEEDED. YOU MUST HAVE THIS LAB WORK DONE BEFORE GOING TO GET YOUR COVID-19 TEST DONE BECAUSE YOU WILL NEED TO QUARANTINE YOURSELF AFTER THE COVID-19 TEST UNTIL THE DAY OF YOUR [place].  Go to: [facility]  [address] [place], [place] [address] FOR YOUR COVID-19 TEST. YOU MUST HAVE YOUR COVID-19 TEST COMPLETED 3 DAYS PRIOR TO YOUR UPCOMING PROCEDURE/TEST. YOUR APPOINTMENT IS ON 12/04/2018 AT 10:30AM. YOU WILL ALSO NEED TO QUARANTINE YOURSELF AFTER THE COVID-19 TEST UNTIL THE DAY OF YOUR PROCEDURE/TEST. RESULTS WILL  BE POSTED IN OUR SYSTEM IN 48 HOURS OR LESS.    You must have a responsible person to drive you home and wait for you at the hospital during your procedure. Failure to do so could result in cancellation.  Bring your insurance cards.  *Special Note: Every effort is made to have your procedure done on time. Occasionally  there are emergencies that occur at the hospital that may cause delays. Please be patient if a delay does occur.    Testing/Procedures:  Your physician has requested that you have an electrocardiogram (EKG). YOU WILL BE CONTACTED TO SET UP THIS APPOINTMENT IN THE OFFICE.   Your physician has requested that you have an echocardiogram. Echocardiography is a painless test that uses sound waves to create images of your heart. It provides your doctor with information about the size and shape of your heart and how well your heart's chambers and valves are working. This procedure takes approximately one hour. There are no restrictions for this procedure. YOU WILL BE CONTACTED BY A SCHEDULER TO SET UP AN APPOINTMENT   Follow-Up: At [facility], you and your health needs are our priority.  As part of our continuing mission to provide you with exceptional heart care, we have created designated Provider Care Teams.  These Care Teams include your primary Cardiologist (physician) and Advanced Practice Providers (APPs -  Physician Assistants and Nurse Practitioners) who all work together to provide you with the care you need, when you need it. You will need a follow up appointment WITH [name] 1-2 WEEKS AFTER YOUR CARDIOVERSION. YOU WILL BE CONTACTED BY A SCHEDULER TO SET UP THIS APPOINTMENT.

## 2018-11-23 NOTE — Telephone Encounter (Signed)
Returned call to patient's wife she was calling to schedule appointment with Dr.Berry in office before 6 weeks.She was told no appointments.Advised I will send message to Dr.Berry's RN.

## 2018-11-23 NOTE — Telephone Encounter (Signed)
New Message             Patient is calling is calling back to get an in office appointment per [name]. The first available was 07/09/, I'm sure [name] wants to see him sooner. Pls call to advise

## 2018-11-23 NOTE — Progress Notes (Signed)
Virtual Visit via Video Note   This visit type was conducted due to national recommendations for restrictions regarding the COVID-19 Pandemic (e.g. social distancing) in an effort to limit this patient's exposure and mitigate transmission in our community.  Due to his co-morbid illnesses, this patient is at least at moderate risk for complications without adequate follow up.  This format is felt to be most appropriate for this patient at this time.  All issues noted in this document were discussed and addressed.  A limited physical exam was performed with this format.  Please refer to the patient's chart for his consent to telehealth for [facility].   Date:  11/23/2018   ID:  [name], DOB June 19, 1950, MRN [id number]  Patient Location: Home Provider Location: Home  PCP:  Lujean Amel, MD  Cardiologist: Dr. Quay Burow Electrophysiologist:  None   Evaluation Performed:  New Patient Evaluation  Chief Complaint: Atrial fibrillation  History of Present Illness:    Christopher Harding is a 68 y.o. thin appearing married Caucasian male father of 2 children, grandfather of one grandchild with one on the way who is accompanied by his wife at home today and was referred by Dr.Koirala for cardiovascular valuation because of newly recognized atrial fibrillation.  His risk factor profile is notable for 50 pack years of tobacco abuse, treated hypertension.  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  He is never had a heart attack or stroke.  There is no family history of heart disease.  He denies chest pain or shortness of breath.  He is unaware that he is in A. fib.  He saw his PCP back in April who noted him to be in atrial fibrillation and began him on Xarelto oral anticoagulation for stroke prophylaxis as well as metoprolol for rate control.  The patient does not have symptoms concerning for COVID-19 infection (fever, chills, cough, or new shortness of breath).    No past medical history on file.  No past surgical history on file.   Current Meds  Medication Sig  . amlodipine-benazepril (LOTREL) 2.5-10 MG capsule Take 1 capsule by mouth daily.  . metoprolol tartrate (LOPRESSOR) 50 MG tablet Take 50 mg by mouth 2 (two) times daily with a meal.  . XARELTO 20 MG TABS tablet Take 20 mg by mouth daily.     Allergies:   Patient has no known allergies.   Social History   Tobacco Use  . Smoking status: Heavy Tobacco Smoker    Packs/day: 1.00    Years: 35.00    Pack years: 35.00  . Smokeless tobacco: Never Used  Substance Use Topics  . Alcohol use: Yes  . Drug use: No     Family Hx: The patient's family history is not on file.  ROS:   Please see the history of present illness.     All other systems reviewed and are negative.   Prior CV studies:   The following studies were reviewed today:  None  Labs/Other Tests and Data Reviewed:    EKG:  No ECG reviewed.  Recent Labs: 01/25/2018: Hemoglobin 14.3; Platelets 241   Recent Lipid Panel Lab Results  Component Value Date/Time   CHOL 182 02/15/2017 04:39 PM   TRIG 56 02/15/2017 04:39 PM   HDL 127 02/15/2017 04:39 PM   CHOLHDL 1.4 02/15/2017 04:39 PM   LDLCALC 44 02/15/2017 04:39 PM    Wt Readings from Last 3 Encounters:  11/23/18 166 lb (75.3 kg)  01/25/18 173  lb (78.5 kg)  10/24/17 173 lb 6.4 oz (78.7 kg)     Objective:    Vital Signs:  Ht 6\' 2"  (1.88 m)   Wt 166 lb (75.3 kg)   BMI 21.31 kg/m    VITAL SIGNS:  reviewed GEN:  no acute distress RESPIRATORY:  normal respiratory effort, symmetric expansion MUSCULOSKELETAL:  no obvious deformities. NEURO:  alert and oriented x 3, no obvious focal deficit PSYCH:  normal affect  ASSESSMENT & PLAN:    1. Atrial fibrillation- history of newly or recognized atrial fibrillation PCP in April he began him on Xarelto and metoprolol.  He is unaware that he is in A. Fib. The CHA2DSVASC2 score is  2 .  I am going to obtain a 2D echocardiogram and arrange for him to  undergo outpatient DC cardioversion in the next several weeks.  He has been on oral anticoagulation uninterrupted for 2 months. 2. Essential hypertension- history of essential hypertension unable to check his blood pressure at home today.  He is on amlodipine, benazepril and metoprolol. 3. 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  The importance of social distancing was discussed today.  Time:   Today, I have spent 18 minutes with the patient with telehealth technology discussing the above problems.     Medication Adjustments/Labs and Tests Ordered: Current medicines are reviewed at length with the patient today.  Concerns regarding medicines are outlined above.   Tests Ordered: No orders of the defined types were placed in this encounter.   Medication Changes: No orders of the defined types were placed in this encounter.   Follow Up:  In Person in 6 week(s)  Signed, Quay Burow, MD  11/23/2018 9:23 AM    [place]

## 2018-11-23 NOTE — H&P (View-Only) (Signed)
Virtual Visit via Video Note   This visit type was conducted due to national recommendations for restrictions regarding the COVID-19 Pandemic (e.g. social distancing) in an effort to limit this patient's exposure and mitigate transmission in our community.  Due to his co-morbid illnesses, this patient is at least at moderate risk for complications without adequate follow up.  This format is felt to be most appropriate for this patient at this time.  All issues noted in this document were discussed and addressed.  A limited physical exam was performed with this format.  Please refer to the patient's chart for his consent to telehealth for [facility].   Date:  11/23/2018   ID:  Christopher Harding, DOB 1950-11-13, MRN [id number]  Patient Location: Home Provider Location: Home  PCP:  Christopher Amel, MD  Cardiologist: Dr. Quay Harding Electrophysiologist:  None   Evaluation Performed:  New Patient Evaluation  Chief Complaint: Atrial fibrillation  History of Present Illness:    Christopher Harding is a 68 y.o. thin appearing married Caucasian male father of 2 children, grandfather of one grandchild with one on the way who is accompanied by his wife at home today and was referred by Christopher Harding for cardiovascular valuation because of newly recognized atrial fibrillation.  His risk factor profile is notable for 50 pack years of tobacco abuse, treated hypertension.  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  He is never had a heart attack or stroke.  There is no family history of heart disease.  He denies chest pain or shortness of breath.  He is unaware that he is in A. fib.  He saw his PCP back in April who noted him to be in atrial fibrillation and began him on Xarelto oral anticoagulation for stroke prophylaxis as well as metoprolol for rate control.  The patient does not have symptoms concerning for COVID-19 infection (fever, chills, cough, or new shortness of breath).    No past medical history on file.  No past surgical history on file.   Current Meds  Medication Sig  . amlodipine-benazepril (LOTREL) 2.5-10 MG capsule Take 1 capsule by mouth daily.  . metoprolol tartrate (LOPRESSOR) 50 MG tablet Take 50 mg by mouth 2 (two) times daily with a meal.  . XARELTO 20 MG TABS tablet Take 20 mg by mouth daily.     Allergies:   Patient has no known allergies.   Social History   Tobacco Use  . Smoking status: Heavy Tobacco Smoker    Packs/day: 1.00    Years: 35.00    Pack years: 35.00  . Smokeless tobacco: Never Used  Substance Use Topics  . Alcohol use: Yes  . Drug use: No     Family Hx: The patient's family history is not on file.  ROS:   Please see the history of present illness.     All other systems reviewed and are negative.   Prior CV studies:   The following studies were reviewed today:  None  Labs/Other Tests and Data Reviewed:    EKG:  No ECG reviewed.  Recent Labs: 01/25/2018: Hemoglobin 14.3; Platelets 241   Recent Lipid Panel Lab Results  Component Value Date/Time   CHOL 182 02/15/2017 04:39 PM   TRIG 56 02/15/2017 04:39 PM   HDL 127 02/15/2017 04:39 PM   CHOLHDL 1.4 02/15/2017 04:39 PM   LDLCALC 44 02/15/2017 04:39 PM    Wt Readings from Last 3 Encounters:  11/23/18 166 lb (75.3 kg)  01/25/18 173  lb (78.5 kg)  10/24/17 173 lb 6.4 oz (78.7 kg)     Objective:    Vital Signs:  Ht 6\' 2"  (1.88 m)   Wt 166 lb (75.3 kg)   BMI 21.31 kg/m    VITAL SIGNS:  reviewed GEN:  no acute distress RESPIRATORY:  normal respiratory effort, symmetric expansion MUSCULOSKELETAL:  no obvious deformities. NEURO:  alert and oriented x 3, no obvious focal deficit PSYCH:  normal affect  ASSESSMENT & PLAN:    1. Atrial fibrillation- history of newly or recognized atrial fibrillation PCP in April he began him on Xarelto and metoprolol.  He is unaware that he is in A. Fib. The CHA2DSVASC2 score is  2 .  I am going to obtain a 2D echocardiogram and arrange for him to  undergo outpatient DC cardioversion in the next several weeks.  He has been on oral anticoagulation uninterrupted for 2 months. 2. Essential hypertension- history of essential hypertension unable to check his blood pressure at home today.  He is on amlodipine, benazepril and metoprolol. 3. 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  The importance of social distancing was discussed today.  Time:   Today, I have spent 18 minutes with the patient with telehealth technology discussing the above problems.     Medication Adjustments/Labs and Tests Ordered: Current medicines are reviewed at length with the patient today.  Concerns regarding medicines are outlined above.   Tests Ordered: No orders of the defined types were placed in this encounter.   Medication Changes: No orders of the defined types were placed in this encounter.   Follow Up:  In Person in 6 week(s)  Signed, Christopher Burow, MD  11/23/2018 9:23 AM    [place]

## 2018-11-23 NOTE — Telephone Encounter (Signed)
Patient and/or DPR-approved person aware of 6/18 AVS instructions and verbalized understanding.  Mychart account set up info emailed to pt. Pt states he will create an account. Letter including After Visit Summary and any other necessary documents to be mailed to the patient's address on file.

## 2018-11-24 ENCOUNTER — Ambulatory Visit (INDEPENDENT_AMBULATORY_CARE_PROVIDER_SITE_OTHER): Payer: 59 | Admitting: *Deleted

## 2018-11-24 ENCOUNTER — Other Ambulatory Visit: Payer: Self-pay

## 2018-11-24 ENCOUNTER — Telehealth: Payer: Self-pay

## 2018-11-24 VITALS — HR 81

## 2018-11-24 DIAGNOSIS — I4819 Other persistent atrial fibrillation: Secondary | ICD-10-CM

## 2018-11-24 NOTE — Progress Notes (Signed)
1.) Reason for visit: EKG check   2.) Name of MD requesting visit: [name]   3.) H&P: new-onset AF, smoker, hypertension  4.) ROS related to problem: no complaints - here for EKG check after 11/23/18 telemedicine visit with MD  5.) Assessment and plan per MD: EKG reviewed by Dr. Martinique (DOD) - AF with HR 81bpm  Patient to continue medications and plan for cardioversion on 12/07/2018

## 2018-11-24 NOTE — Telephone Encounter (Signed)
Pt presented to office today for EKG. Pt in A Fib. Discussed decreasing alcohol consumption with him to maximize chance of cardioversion and maintain NSR per [name] request. Also provided education in print form on alcohol dependence/abuse, intoxication, and withdrawal syndrome. Pt stated he would try to decrease alcohol intake.

## 2018-11-25 LAB — BASIC METABOLIC PANEL
BUN/Creatinine Ratio: 12 (ref 10–24)
BUN: 8 mg/dL (ref 8–27)
CO2: 19 mmol/L — ABNORMAL LOW (ref 20–29)
Calcium: 9.6 mg/dL (ref 8.6–10.2)
Chloride: 90 mmol/L — ABNORMAL LOW (ref 96–106)
Creatinine, Ser: 0.69 mg/dL — ABNORMAL LOW (ref 0.76–1.27)
GFR calc Af Amer: 113 mL/min/{1.73_m2} (ref >59)
GFR calc non Af Amer: 98 mL/min/{1.73_m2} (ref >59)
Glucose: 79 mg/dL (ref 65–99)
Potassium: 4.5 mmol/L (ref 3.5–5.2)
Sodium: 129 mmol/L — ABNORMAL LOW (ref 134–144)

## 2018-11-25 LAB — CBC
Hematocrit: 43.5 % (ref 37.5–51.0)
Hemoglobin: 15.2 g/dL (ref 13.0–17.7)
MCH: 35.8 pg — ABNORMAL HIGH (ref 26.6–33.0)
MCHC: 34.9 g/dL (ref 31.5–35.7)
MCV: 102 fL — ABNORMAL HIGH (ref 79–97)
Platelets: 221 10*3/uL (ref 150–450)
RBC: 4.25 x10E6/uL (ref 4.14–5.80)
RDW: 12.6 % (ref 11.6–15.4)
WBC: 6.8 10*3/uL (ref 3.4–10.8)

## 2018-11-29 ENCOUNTER — Other Ambulatory Visit: Payer: Self-pay

## 2018-11-30 ENCOUNTER — Telehealth (HOSPITAL_COMMUNITY): Payer: Self-pay | Admitting: Cardiology

## 2018-11-30 NOTE — Telephone Encounter (Signed)

## 2018-11-30 NOTE — Telephone Encounter (Signed)
Called to give echo appointment instructions.

## 2018-12-01 ENCOUNTER — Other Ambulatory Visit: Payer: Self-pay

## 2018-12-01 ENCOUNTER — Ambulatory Visit (HOSPITAL_COMMUNITY): Payer: 59 | Attending: Internal Medicine

## 2018-12-01 DIAGNOSIS — I4819 Other persistent atrial fibrillation: Secondary | ICD-10-CM | POA: Insufficient documentation

## 2018-12-04 ENCOUNTER — Other Ambulatory Visit (HOSPITAL_COMMUNITY)
Admission: RE | Admit: 2018-12-04 | Discharge: 2018-12-04 | Disposition: A | Discharge status: Home / Self Care | Payer: 59 | Source: Ambulatory Visit | Attending: Cardiovascular Disease | Admitting: Cardiovascular Disease

## 2018-12-04 DIAGNOSIS — Z1159 Encounter for screening for other viral diseases: Secondary | ICD-10-CM | POA: Diagnosis not present

## 2018-12-04 DIAGNOSIS — Z01812 Encounter for preprocedural laboratory examination: Secondary | ICD-10-CM | POA: Diagnosis not present

## 2018-12-04 LAB — SARS CORONAVIRUS 2 (TAT 6-24 HRS): SARS Coronavirus 2: NEGATIVE

## 2018-12-06 ENCOUNTER — Other Ambulatory Visit: Payer: Self-pay

## 2018-12-06 ENCOUNTER — Telehealth: Payer: Self-pay

## 2018-12-06 DIAGNOSIS — I4819 Other persistent atrial fibrillation: Secondary | ICD-10-CM

## 2018-12-06 NOTE — Addendum Note (Signed)
Addended by: Annita Brod on: 12/06/2018 05:40 PM   Modules accepted: Level of Service, SmartSet

## 2018-12-06 NOTE — Telephone Encounter (Addendum)
error    This encounter was created in error - please disregard.

## 2018-12-07 ENCOUNTER — Ambulatory Visit (HOSPITAL_COMMUNITY)
Admission: RE | Admit: 2018-12-07 | Discharge: 2018-12-07 | Disposition: A | Discharge status: Home / Self Care | Payer: Managed Care, Other (non HMO) | Attending: Cardiovascular Disease | Admitting: Cardiovascular Disease

## 2018-12-07 ENCOUNTER — Ambulatory Visit (HOSPITAL_COMMUNITY): Payer: Managed Care, Other (non HMO) | Admitting: Certified Registered"

## 2018-12-07 ENCOUNTER — Other Ambulatory Visit: Payer: Self-pay

## 2018-12-07 ENCOUNTER — Encounter (HOSPITAL_COMMUNITY): Payer: Self-pay | Admitting: *Deleted

## 2018-12-07 ENCOUNTER — Encounter (HOSPITAL_COMMUNITY)
Admission: RE | Disposition: A | Discharge status: Home / Self Care | Payer: Self-pay | Source: Home / Self Care | Attending: Cardiovascular Disease

## 2018-12-07 DIAGNOSIS — Z7901 Long term (current) use of anticoagulants: Secondary | ICD-10-CM | POA: Diagnosis not present

## 2018-12-07 DIAGNOSIS — I4891 Unspecified atrial fibrillation: Secondary | ICD-10-CM | POA: Diagnosis not present

## 2018-12-07 DIAGNOSIS — Z79899 Other long term (current) drug therapy: Secondary | ICD-10-CM | POA: Diagnosis not present

## 2018-12-07 DIAGNOSIS — F17210 Nicotine dependence, cigarettes, uncomplicated: Secondary | ICD-10-CM | POA: Diagnosis not present

## 2018-12-07 DIAGNOSIS — I10 Essential (primary) hypertension: Secondary | ICD-10-CM | POA: Diagnosis not present

## 2018-12-07 DIAGNOSIS — I4819 Other persistent atrial fibrillation: Secondary | ICD-10-CM

## 2018-12-07 HISTORY — PX: CARDIOVERSION: SHX1299

## 2018-12-07 SURGERY — CARDIOVERSION
Anesthesia: General

## 2018-12-07 MED ORDER — PROPOFOL 10 MG/ML IV BOLUS
INTRAVENOUS | Status: DC | PRN
  Administered 2018-12-07 (×2): 50 mg via INTRAVENOUS

## 2018-12-07 MED ORDER — SODIUM CHLORIDE 0.9 % IV SOLN
INTRAVENOUS | Status: DC
  Administered 2018-12-07: 500 mL via INTRAVENOUS
  Administered 2018-12-07: 14:00:00 via INTRAVENOUS

## 2018-12-07 MED ORDER — LIDOCAINE 2% (20 MG/ML) 5 ML SYRINGE
INTRAMUSCULAR | Status: DC | PRN
  Administered 2018-12-07: 60 mg via INTRAVENOUS

## 2018-12-07 NOTE — Anesthesia Procedure Notes (Signed)
Procedure Name: General with mask airway Date/Time: 12/07/2018 1:31 PM Performed by: Alain Marion, CRNA Pre-anesthesia Checklist: Patient identified, Emergency Drugs available, Suction available and Patient being monitored Patient Re-evaluated:Patient Re-evaluated prior to induction Oxygen Delivery Method: Ambu bag and Nasal cannula Preoxygenation: Pre-oxygenation with 100% oxygen Placement Confirmation: positive ETCO2

## 2018-12-07 NOTE — Interval H&P Note (Signed)
History and Physical Interval Note:  12/07/2018 2:06 PM  Christopher Harding  has presented today for surgery, with the diagnosis of AFIB.  The various methods of treatment have been discussed with the patient and family. After consideration of risks, benefits and other options for treatment, the patient has consented to  Procedure(s): CARDIOVERSION (N/A) as a surgical intervention.  The patient's history has been reviewed, patient examined, no change in status, stable for surgery.  I have reviewed the patient's chart and labs.  Questions were answered to the patient's satisfaction.     Jenkins Rouge

## 2018-12-07 NOTE — Discharge Instructions (Signed)

## 2018-12-07 NOTE — Transfer of Care (Signed)
Immediate Anesthesia Transfer of Care Note  Patient: Christopher Harding  Procedure(s) Performed: CARDIOVERSION (N/A )  Patient Location: Endoscopy Unit  Anesthesia Type:MAC  Level of Consciousness: awake, alert  and oriented  Airway & Oxygen Therapy: Patient Spontanous Breathing and Patient connected to nasal cannula oxygen  Post-op Assessment: Report given to RN  Post vital signs: Reviewed and stable  Last Vitals:  Vitals Value Taken Time  BP 108/64 12/07/18 1339  Temp    Pulse 65 12/07/18 1339  Resp 21 12/07/18 1339  SpO2 100 % 12/07/18 1339    Last Pain:  Vitals:   12/07/18 1301  TempSrc: Oral  PainSc: 0-No pain         Complications: No apparent anesthesia complications

## 2018-12-07 NOTE — Anesthesia Preprocedure Evaluation (Addendum)
Anesthesia Evaluation  Patient identified by MRN, date of birth, ID band Patient awake    Reviewed: Allergy & Precautions, NPO status , Patient's Chart, lab work & pertinent test results, reviewed documented beta blocker date and time   Airway Mallampati: I  TM Distance: >3 FB Neck ROM: Full    Dental no notable dental hx. (+) Poor Dentition, Dental Advisory Given   Pulmonary neg pulmonary ROS, Current Smoker,    Pulmonary exam normal breath sounds clear to auscultation       Cardiovascular hypertension, Pt. on medications and Pt. on home beta blockers Normal cardiovascular exam+ dysrhythmias Atrial Fibrillation  Rhythm:Irregular Rate:Normal  TTE 2020 LVEF 55-60%, normal wall thickness, normal wall motion, normal biatrial size, mild aortic valve sclerosis, mild TR, RVSP 28 mmHg, normal IVC   Neuro/Psych negative neurological ROS  negative psych ROS   GI/Hepatic negative GI ROS, Neg liver ROS,   Endo/Other  negative endocrine ROS  Renal/GU negative Renal ROS  negative genitourinary   Musculoskeletal negative musculoskeletal ROS (+)   Abdominal   Peds  Hematology  (+) Blood dyscrasia (on xarelto), ,   Anesthesia Other Findings   Reproductive/Obstetrics                            Anesthesia Physical Anesthesia Plan  ASA: III  Anesthesia Plan: General   Post-op Pain Management:    Induction: Intravenous  PONV Risk Score and Plan: 1 and Propofol infusion and Treatment may vary due to age or medical condition  Airway Management Planned: Mask and Natural Airway  Additional Equipment:   Intra-op Plan:   Post-operative Plan:   Informed Consent: I have reviewed the patients History and Physical, chart, labs and discussed the procedure including the risks, benefits and alternatives for the proposed anesthesia with the patient or authorized representative who has indicated his/her  understanding and acceptance.     Dental advisory given  Plan Discussed with: CRNA  Anesthesia Plan Comments:         Anesthesia Quick Evaluation

## 2018-12-07 NOTE — Anesthesia Postprocedure Evaluation (Signed)
Anesthesia Post Note  Patient: Christopher Harding  Procedure(s) Performed: CARDIOVERSION (N/A )     Patient location during evaluation: PACU Anesthesia Type: General Level of consciousness: awake and alert Pain management: pain level controlled Vital Signs Assessment: post-procedure vital signs reviewed and stable Respiratory status: spontaneous breathing, nonlabored ventilation and respiratory function stable Cardiovascular status: blood pressure returned to baseline and stable Postop Assessment: no apparent nausea or vomiting Anesthetic complications: no    Last Vitals:  Vitals:   12/07/18 1339 12/07/18 1341  BP: 108/64 129/62  Pulse: 65   Resp: (!) 21 12  Temp:  36.8 C  SpO2: 100% 100%    Last Pain:  Vitals:   12/07/18 1358  TempSrc:   PainSc: 0-No pain                 Audry Pili

## 2018-12-07 NOTE — Telephone Encounter (Signed)
Open n error °

## 2018-12-07 NOTE — CV Procedure (Signed)
[facility]: Anesthesia: Dr Fransisco Beau Propofol and Lidocaine  DCC x 2 150 J and 200 J biphasic  Converted from afib rate 112 to NSR rate 64 bpm  No immediate neurologic sequelae On Rx anticoagulation  Jenkins Rouge

## 2018-12-09 ENCOUNTER — Encounter (HOSPITAL_COMMUNITY): Payer: Self-pay | Admitting: Cardiovascular Disease

## 2018-12-14 NOTE — Telephone Encounter (Signed)
Left message to call back  

## 2018-12-18 ENCOUNTER — Telehealth: Payer: BLUE CROSS/BLUE SHIELD | Admitting: Cardiology

## 2018-12-27 ENCOUNTER — Encounter: Payer: Self-pay | Admitting: Cardiovascular Disease

## 2018-12-27 ENCOUNTER — Encounter (INDEPENDENT_AMBULATORY_CARE_PROVIDER_SITE_OTHER): Payer: Self-pay

## 2018-12-27 ENCOUNTER — Other Ambulatory Visit: Payer: Self-pay

## 2018-12-27 ENCOUNTER — Telehealth: Payer: Self-pay

## 2018-12-27 ENCOUNTER — Ambulatory Visit (INDEPENDENT_AMBULATORY_CARE_PROVIDER_SITE_OTHER): Payer: 59 | Admitting: Cardiovascular Disease

## 2018-12-27 DIAGNOSIS — I4819 Other persistent atrial fibrillation: Secondary | ICD-10-CM | POA: Diagnosis not present

## 2018-12-27 DIAGNOSIS — I10 Essential (primary) hypertension: Secondary | ICD-10-CM | POA: Diagnosis not present

## 2018-12-27 NOTE — Progress Notes (Signed)
12/27/2018 Christopher Harding   [birth year]  [id number]  Primary Physician Christopher Amel, MD Primary Cardiologist: Christopher Harp MD FACP, [place], [place], [place]  HPI:  Christopher Harding is a 68 y.o.  thin appearing married Caucasian male father of 2 children, grandfather of one grandchild with one on the way who I last saw him in the office virtually 11/23/2018. His risk factor profile is notable for 50 pack years of tobacco abuse, treated hypertension.  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  He is never had a heart attack or stroke.  There is no family history of heart disease.  He denies chest pain or shortness of breath.  He is unaware that he is in A. fib.  He saw his PCP back in April who noted him to be in atrial fibrillation and began him on Xarelto oral anticoagulation for stroke prophylaxis as well as metoprolol for rate control.  He underwent successful outpatient DC cardioversion by Dr. Johnsie Cancel on 12/07/2018 to sinus rhythm and feels clinically improved.  He remains on Xarelto oral anticoagulation.  He says that he is reduced his ethanol intake.   Current Meds  Medication Sig  . amlodipine-benazepril (LOTREL) 2.5-10 MG capsule Take 1 capsule by mouth daily.  . metoprolol tartrate (LOPRESSOR) 50 MG tablet Take 50 mg by mouth 2 (two) times daily with a meal.  . XARELTO 20 MG TABS tablet Take 20 mg by mouth daily.     No Known Allergies  Social History   Socioeconomic History  . Marital status: Married    Spouse name: Not on file  . Number of children: Not on file  . Years of education: Not on file  . Highest education level: Not on file  Occupational History  . Not on file  Social Needs  . Financial resource strain: Not on file  . Food insecurity    Worry: Not on file    Inability: Not on file  . Transportation needs    Medical: Not on file    Non-medical: Not on file  Tobacco Use  . Smoking status: Heavy Tobacco Smoker    Packs/day: 1.00    Years: 35.00    Pack years: 35.00  .  Smokeless tobacco: Never Used  Substance and Sexual Activity  . Alcohol use: Yes  . Drug use: No  . Sexual activity: Not on file  Lifestyle  . Physical activity    Days per week: Not on file    Minutes per session: Not on file  . Stress: Not on file  Relationships  . Social Herbalist on phone: Not on file    Gets together: Not on file    Attends religious service: Not on file    Active member of club or organization: Not on file    Attends meetings of clubs or organizations: Not on file    Relationship status: Not on file  . Intimate partner violence    Fear of current or ex partner: Not on file    Emotionally abused: Not on file    Physically abused: Not on file    Forced sexual activity: Not on file  Other Topics Concern  . Not on file  Social History Narrative  . Not on file     Review of Systems: General: negative for chills, fever, night sweats or weight changes.  Cardiovascular: negative for chest pain, dyspnea on exertion, edema, orthopnea, palpitations, paroxysmal nocturnal dyspnea or shortness of  breath Dermatological: negative for rash Respiratory: negative for cough or wheezing Urologic: negative for hematuria Abdominal: negative for nausea, vomiting, diarrhea, bright red blood per rectum, melena, or hematemesis Neurologic: negative for visual changes, syncope, or dizziness All other systems reviewed and are otherwise negative except as noted above.    Blood pressure 118/70, pulse (!) 55, temperature (!) 97.4 F (36.3 C), height 6\' 2"  (1.88 m), weight 175 lb (79.4 kg).  General appearance: alert and no distress Neck: no adenopathy, no carotid bruit, no JVD, supple, symmetrical, trachea midline and thyroid not enlarged, symmetric, no tenderness/mass/nodules Lungs: clear to auscultation bilaterally Heart: regular rate and rhythm, S1, S2 normal, no murmur, click, rub or gallop Extremities: extremities normal, atraumatic, no cyanosis or edema Pulses:  2+ and symmetric Skin: Skin color, texture, turgor normal. No rashes or lesions Neurologic: Alert and oriented X 3, normal strength and tone. Normal symmetric reflexes. Normal coordination and gait  EKG not performed today  ASSESSMENT AND PLAN:   Persistent atrial fibrillation History of persistent atrial fibrillation on Xarelto status post successful outpatient DC cardioversion by Dr. Johnsie Cancel on 12/07/2018.  He wishes to have a hernia.  I have told him that I prefer he did not stop his relative for 3 months after cardioversion.  Essential hypertension History of essential hypertension with blood pressure measured today at 118/70.  He is on metoprolol, amlodipine and benazepril.      Christopher Harp MD FACP,FACC,FAHA, [facility] 12/27/2018 2:53 PM

## 2018-12-27 NOTE — Assessment & Plan Note (Signed)
History of persistent atrial fibrillation on Xarelto status post successful outpatient DC cardioversion by Dr. Johnsie Cancel on 12/07/2018.  He wishes to have a hernia.  I have told him that I prefer he did not stop his relative for 3 months after cardioversion.

## 2018-12-27 NOTE — Telephone Encounter (Signed)
Primary nurse was instructed by [name] today in the office to contact pt and inform him of the following: Pt elective hernia surgery may be scheduled after the first week of August 2020. [name] consulted with another provider who informed him that pt may have his surgery 4 weeks post cardioversion (cardioversion was performed on 12/07/2018). Pt to stop Xarelto 2 days prior to procedure and may restart after he is cleared by surgeon to do so.    Spoke with pt and informed of the above. Pt states he will let surgeon know. Advised pt to have the surgeon send cardiac clearance to heartcare office. Pt verbalized understanding

## 2018-12-27 NOTE — Patient Instructions (Addendum)
Medication Instructions:  Your physician recommends that you continue on your current medications as directed. Please refer to the Current Medication list given to you today.  If you need a refill on your cardiac medications before your next appointment, please call your pharmacy.   Lab work: NONE If you have labs (blood work) drawn today and your tests are completely normal, you will receive your results only by: Marland Kitchen MyChart Message (if you have MyChart) OR . A paper copy in the mail If you have any lab test that is abnormal or we need to change your treatment, we will call you to review the results.  Testing/Procedures: NONE  Follow-Up: At [facility], you and your health needs are our priority.  As part of our continuing mission to provide you with exceptional heart care, we have created designated Provider Care Teams.  These Care Teams include your primary Cardiologist (physician) and Advanced Practice Providers (APPs -  Physician Assistants and Nurse Practitioners) who all work together to provide you with the care you need, when you need it. You will need a follow up appointment in 6 months WITH [name].  Please call our office 2 months in advance to schedule this appointment.

## 2018-12-27 NOTE — Assessment & Plan Note (Signed)
History of essential hypertension with blood pressure measured today at 118/70.  He is on metoprolol, amlodipine and benazepril.

## 2019-01-22 ENCOUNTER — Other Ambulatory Visit (HOSPITAL_COMMUNITY)
Admission: RE | Admit: 2019-01-22 | Discharge: 2019-01-22 | Disposition: A | Discharge status: Home / Self Care | Payer: 59 | Source: Ambulatory Visit | Attending: Surgery | Admitting: Surgery

## 2019-01-22 DIAGNOSIS — Z01812 Encounter for preprocedural laboratory examination: Secondary | ICD-10-CM | POA: Diagnosis not present

## 2019-01-22 DIAGNOSIS — Z20828 Contact with and (suspected) exposure to other viral communicable diseases: Secondary | ICD-10-CM | POA: Diagnosis not present

## 2019-01-22 LAB — SARS CORONAVIRUS 2 (TAT 6-24 HRS): SARS Coronavirus 2: NEGATIVE

## 2019-01-24 ENCOUNTER — Other Ambulatory Visit: Payer: Self-pay

## 2019-01-24 ENCOUNTER — Encounter (HOSPITAL_COMMUNITY): Payer: Self-pay | Admitting: Vascular Surgery

## 2019-01-24 NOTE — Progress Notes (Signed)
Anesthesia Chart Review: Christopher Harding   Case: [id number] Date/Time: 01/25/19 0846   Procedure: RIGHT INGUINAL HERNIA REPAIR WITH MESH (Right ) - TAP BLOCK   Anesthesia type: General   Pre-op diagnosis: RIGHT INGUINAL HERNIA   Location: [place] OR ROOM 02 / [place] [facility]   Surgeon: Coralie Keens, MD    Special Needs: Tap Block  DISCUSSION: Patient is a 68 year old male scheduled for the above procedure.  History includes smoking, afib (diagnosed 09/2018, s/p DCCV 12/07/18), HTN. As of 11/2018, was drinking six beers/day, but was cutting down on intake by his 12/2018 visit with cardiology.   According to 12/27/18 telephone encounter by Renato Gails, RN, "Primary nurse was instructed by [name] today in the office to contact pt and inform him of the following: Pt elective hernia surgery may be scheduled after the first week of August 2020. [name] consulted with another provider who informed him that pt may have his surgery 4 weeks post cardioversion (cardioversion was performed on 12/07/2018). Pt to stop Xarelto 2 days prior to procedure and may restart after he is cleared by surgeon to do so."  He is for updated labs on arrival. 01/22/19 presurgical COVID-19 test was negative.   PROVIDERS: Koirala, Dibas, MD is PCP  Quay Burow, MD is cardiologist   LABS: He is for labs on arrival. As of 11/24/18, H/H 15.2/43.5, Cr 0.69, glucose 79.    EKG: 12/07/18: Normal sinus rhythm Anteroseptal infarct , age undetermined Abnormal ECG No old tracing to compare Confirmed by Charolette Forward [id number]) on 12/07/2018 4:24:05 PM   CV: Echo 12/01/18: IMPRESSIONS  1. The left ventricle has normal systolic function, with an ejection fraction of 55-60%. The cavity size was normal. Left ventricular diastolic function could not be evaluated secondary to atrial fibrillation. No evidence of left ventricular regional  wall motion abnormalities.  2. The right ventricle has normal systolic function. The cavity was normal.  There is no increase in right ventricular wall thickness.  3. The mitral valve is grossly normal.  4. The tricuspid valve is grossly normal.  5. The aortic valve is tricuspid. Mild sclerosis of the aortic valve. No stenosis of the aortic valve.   Past Medical History:  Diagnosis Date  . A-fib [facility])    s/p DCCV 12/07/18  . Hypertension     Past Surgical History:  Procedure Laterality Date  . CARDIOVERSION N/A 12/07/2018   Procedure: CARDIOVERSION;  Surgeon: Josue Hector, MD;  Location: [facility] ENDOSCOPY;  Service: Cardiovascular;  Laterality: N/A;    MEDICATIONS: No current facility-administered medications for this encounter.    Marland Kitchen amlodipine-benazepril (LOTREL) 2.5-10 MG capsule  . ibuprofen (ADVIL) 200 MG tablet  . metoprolol tartrate (LOPRESSOR) 50 MG tablet  . XARELTO 20 MG TABS tablet    Myra Gianotti, PA-C Surgical Short Stay/Anesthesiology [facility] Phone [phone] [facility] Phone [phone] 01/24/2019 10:39 AM

## 2019-01-24 NOTE — Progress Notes (Signed)
Patient denies shortness of breath, fever, cough and chest pain.  PCP - Dr Lauretta Grill Koirala Cardiologist - Dr Quay Burow  Chest x-ray - Denies EKG - 12/07/18 Stress Test - Denies ECHO - 12/01/18 Cardiac Cath - Denies  Blood Thinner Instructions: Xarelto stop 2 days prior to surgery per MD. Last dose 01/23/19 per patient/  ERAS:clear til 6am DOS, no drink   Aspirin Instructions: Yes, [name], PA  STOP now taking any Aspirin (unless otherwise instructed by your surgeon), Aleve, Naproxen, Ibuprofen, Motrin, Advil, Goody's, BC's, all herbal medications, fish oil, and all vitamins.   Coronavirus Screening Have you or your wife experienced the following symptoms:  Cough yes/no: No Fever (>100.59F)  yes/no: No Runny nose yes/no: No Sore throat yes/no: No Difficulty breathing/shortness of breath  yes/no: No  Have you or your wife traveled in the last 14 days and where? yes/no: No

## 2019-01-24 NOTE — Anesthesia Preprocedure Evaluation (Addendum)
Anesthesia Evaluation  Patient identified by MRN, date of birth, ID band Patient awake    Reviewed: Allergy & Precautions, NPO status , Patient's Chart, lab work & pertinent test results, reviewed documented beta blocker date and time   Airway Mallampati: II  TM Distance: >3 FB Neck ROM: Full    Dental no notable dental hx.    Pulmonary neg pulmonary ROS, Current Smoker and Patient abstained from smoking.,    Pulmonary exam normal breath sounds clear to auscultation       Cardiovascular hypertension, Pt. on medications and Pt. on home beta blockers negative cardio ROS Normal cardiovascular exam Rhythm:Regular Rate:Normal     Neuro/Psych negative neurological ROS  negative psych ROS   GI/Hepatic negative GI ROS, Neg liver ROS,   Endo/Other  negative endocrine ROS  Renal/GU negative Renal ROS  negative genitourinary   Musculoskeletal negative musculoskeletal ROS (+)   Abdominal   Peds negative pediatric ROS (+)  Hematology negative hematology ROS (+)   Anesthesia Other Findings   Reproductive/Obstetrics negative OB ROS                            Anesthesia Physical Anesthesia Plan  ASA: III  Anesthesia Plan: General   Post-op Pain Management:  Regional for Post-op pain   Induction: Intravenous  PONV Risk Score and Plan: 1 and Ondansetron and Treatment may vary due to age or medical condition  Airway Management Planned: LMA  Additional Equipment:   Intra-op Plan:   Post-operative Plan: Extubation in OR  Informed Consent: I have reviewed the patients History and Physical, chart, labs and discussed the procedure including the risks, benefits and alternatives for the proposed anesthesia with the patient or authorized representative who has indicated his/her understanding and acceptance.     Dental advisory given  Plan Discussed with: CRNA  Anesthesia Plan Comments: (PAT note  written 01/24/2019 by Myra Gianotti, PA-C. )       Anesthesia Quick Evaluation                                  Anesthesia Evaluation  Patient identified by MRN, date of birth, ID band Patient awake    Reviewed: Allergy & Precautions, NPO status , Patient's Chart, lab work & pertinent test results, reviewed documented beta blocker date and time   Airway Mallampati: I  TM Distance: >3 FB Neck ROM: Full    Dental no notable dental hx. (+) Poor Dentition, Dental Advisory Given   Pulmonary neg pulmonary ROS, Current Smoker,    Pulmonary exam normal breath sounds clear to auscultation       Cardiovascular hypertension, Pt. on medications and Pt. on home beta blockers Normal cardiovascular exam+ dysrhythmias Atrial Fibrillation  Rhythm:Irregular Rate:Normal  TTE 2020 LVEF 55-60%, normal wall thickness, normal wall motion, normal biatrial size, mild aortic valve sclerosis, mild TR, RVSP 28 mmHg, normal IVC   Neuro/Psych negative neurological ROS  negative psych ROS   GI/Hepatic negative GI ROS, Neg liver ROS,   Endo/Other  negative endocrine ROS  Renal/GU negative Renal ROS  negative genitourinary   Musculoskeletal negative musculoskeletal ROS (+)   Abdominal   Peds  Hematology  (+) Blood dyscrasia (on xarelto), ,   Anesthesia Other Findings   Reproductive/Obstetrics  Anesthesia Physical Anesthesia Plan  ASA: III  Anesthesia Plan: General   Post-op Pain Management:    Induction: Intravenous  PONV Risk Score and Plan: 1 and Propofol infusion and Treatment may vary due to age or medical condition  Airway Management Planned: Mask and Natural Airway  Additional Equipment:   Intra-op Plan:   Post-operative Plan:   Informed Consent: I have reviewed the patients History and Physical, chart, labs and discussed the procedure including the risks, benefits and alternatives for the proposed  anesthesia with the patient or authorized representative who has indicated his/her understanding and acceptance.     Dental advisory given  Plan Discussed with: CRNA  Anesthesia Plan Comments:         Anesthesia Quick Evaluation

## 2019-01-24 NOTE — H&P (Signed)
Christopher Harding  Location: [facility] Patient #: [id number] DOB: [birth year] Married / Language: English / Race: White Male   History of Present Illness  The patient is a 68 year old male who presents with an inguinal hernia. This patient is referred by Dr. Lujean Amel for evaluation of a right inguinal hernia. The patient reports that he has had the hernia for at least 3 months. He reports it easily reduces. He has almost no discomfort and no obstructive symptoms. He was recently diagnosed with A. fib and is on Xeralto. He has not seen a cardiologist yet. He has no previous surgical history. He has no previous heart issues. He does drink moderate alcohol daily and smokes.   Past Surgical History  No pertinent past surgical history   Diagnostic Studies History Colonoscopy  never  Allergies  No Known Drug Allergies  Allergies Reconciled   Medication History  amLODIPine Besy-Benazepril HCl (2.5-10MG  Capsule, Oral) Active. Xarelto (20MG  Tablet, Oral) Active. Metoprolol Tartrate (50MG  Tablet, Oral) Active. Medications Reconciled  Social History Alcohol use  Moderate alcohol use. Caffeine use  Coffee. No drug use  Tobacco use  Current every day smoker.  Family History  Cerebrovascular Accident  Father. Colon Polyps  Father. Hypertension  Mother.  Other Problems No pertinent past medical history     Review of Systems  General Not Present- Appetite Loss, Chills, Fatigue, Fever, Night Sweats, Weight Gain and Weight Loss. Skin Not Present- Change in Wart/Mole, Dryness, Hives, Jaundice, New Lesions, Non-Healing Wounds, Rash and Ulcer. HEENT Not Present- Earache, Hearing Loss, Hoarseness, Nose Bleed, Oral Ulcers, Ringing in the Ears, Seasonal Allergies, Sinus Pain, Sore Throat, Visual Disturbances, Wears glasses/contact lenses and Yellow Eyes. Respiratory Not Present- Bloody sputum, Chronic Cough, Difficulty Breathing, Snoring and  Wheezing. Breast Not Present- Breast Mass, Breast Pain, Nipple Discharge and Skin Changes. Cardiovascular Not Present- Chest Pain, Difficulty Breathing Lying Down, Leg Cramps, Palpitations, Rapid Heart Rate, Shortness of Breath and Swelling of Extremities. Gastrointestinal Not Present- Abdominal Pain, Bloating, Bloody Stool, Change in Bowel Habits, Chronic diarrhea, Constipation, Difficulty Swallowing, Excessive gas, Gets full quickly at meals, Hemorrhoids, Indigestion, Nausea, Rectal Pain and Vomiting. Male Genitourinary Not Present- Blood in Urine, Change in Urinary Stream, Frequency, Impotence, Nocturia, Painful Urination, Urgency and Urine Leakage. Neurological Not Present- Decreased Memory, Fainting, Headaches, Numbness, Seizures, Tingling, Tremor, Trouble walking and Weakness. Psychiatric Not Present- Anxiety, Bipolar, Change in Sleep Pattern, Depression, Fearful and Frequent crying. Endocrine Not Present- Cold Intolerance, Excessive Hunger, Hair Changes, Heat Intolerance, Hot flashes and New Diabetes. Hematology Not Present- Blood Thinners, Easy Bruising, Excessive bleeding, Gland problems, HIV and Persistent Infections.  Vitals  Weight: 166 lb Height: 74in Body Surface Area: 2.01 m Body Mass Index: 21.31 kg/m  Temp.: 98.18F  Pulse: 71 (Regular)  BP: 138/86(Sitting, Left Arm, Standard)     Physical Exam General Mental Status-Alert. General Appearance-Consistent with stated age. Hydration-Well hydrated. Voice-Normal.  Head and Neck Head-normocephalic, atraumatic with no lesions or palpable masses. Trachea-midline.  Eye Eyeball - Bilateral-Extraocular movements intact. Sclera/Conjunctiva - Bilateral-No scleral icterus.  Chest and Lung Exam Chest and lung exam reveals -quiet, even and easy respiratory effort with no use of accessory muscles and on auscultation, normal breath sounds, no adventitious sounds and normal vocal  resonance. Inspection Chest Wall - Normal. Back - normal.  Cardiovascular Cardiovascular examination reveals -normal heart sounds, regular rate and rhythm with no murmurs and normal pedal pulses bilaterally.  Abdomen Inspection Skin - Scar - no surgical scars. Hernias -  Inguinal hernia - Right - Reducible. Palpation/Percussion Palpation and Percussion of the abdomen reveal - Soft, Non Tender, No Rebound tenderness, No Rigidity (guarding) and No hepatosplenomegaly. Auscultation Auscultation of the abdomen reveals - Bowel sounds normal.  Neurologic - Did not examine.  Musculoskeletal - Did not examine.    Assessment & Plan (Neeko Pharo A. Ninfa Linden MD; 11/01/2018 11:16 AM)  RIGHT INGUINAL HERNIA (K40.90)  Impression: This is a patient with an easily reducible right inguinal hernia. I discussed the diagnosis with him in detail. We discussed surgical repair of hernias. We discussed the anatomy. I recommended open right inguinal hernia repair with mesh which could be done under less anesthesia and less risk given the need for anticoagulation. I discussed risks of surgery which includes but is not limited to bleeding, infection, nerve entrapment, chronic pain, hernia recurrence, use of mesh, etc. He will need cardiac clearance preoperatively.

## 2019-01-25 ENCOUNTER — Ambulatory Visit (HOSPITAL_COMMUNITY)
Admission: RE | Admit: 2019-01-25 | Discharge: 2019-01-25 | Disposition: A | Discharge status: Home / Self Care | Payer: No Typology Code available for payment source | Attending: Surgery | Admitting: Surgery

## 2019-01-25 ENCOUNTER — Encounter (HOSPITAL_COMMUNITY)
Admission: RE | Disposition: A | Discharge status: Home / Self Care | Payer: Self-pay | Source: Home / Self Care | Attending: Surgery

## 2019-01-25 ENCOUNTER — Encounter (HOSPITAL_COMMUNITY): Payer: Self-pay | Admitting: Surgery

## 2019-01-25 ENCOUNTER — Ambulatory Visit (HOSPITAL_COMMUNITY): Payer: No Typology Code available for payment source | Admitting: Vascular Surgery

## 2019-01-25 DIAGNOSIS — F17200 Nicotine dependence, unspecified, uncomplicated: Secondary | ICD-10-CM | POA: Insufficient documentation

## 2019-01-25 DIAGNOSIS — Z79899 Other long term (current) drug therapy: Secondary | ICD-10-CM | POA: Insufficient documentation

## 2019-01-25 DIAGNOSIS — I4891 Unspecified atrial fibrillation: Secondary | ICD-10-CM | POA: Diagnosis not present

## 2019-01-25 DIAGNOSIS — K4090 Unilateral inguinal hernia, without obstruction or gangrene, not specified as recurrent: Secondary | ICD-10-CM | POA: Diagnosis not present

## 2019-01-25 DIAGNOSIS — I10 Essential (primary) hypertension: Secondary | ICD-10-CM | POA: Insufficient documentation

## 2019-01-25 DIAGNOSIS — Z7901 Long term (current) use of anticoagulants: Secondary | ICD-10-CM | POA: Insufficient documentation

## 2019-01-25 DIAGNOSIS — Z8249 Family history of ischemic heart disease and other diseases of the circulatory system: Secondary | ICD-10-CM | POA: Insufficient documentation

## 2019-01-25 HISTORY — DX: Essential (primary) hypertension: I10

## 2019-01-25 HISTORY — PX: INSERTION OF MESH: SHX5868

## 2019-01-25 HISTORY — PX: INGUINAL HERNIA REPAIR: SHX194

## 2019-01-25 HISTORY — DX: Unspecified atrial fibrillation: I48.91

## 2019-01-25 LAB — PROTIME-INR
INR: 1.1 (ref 0.8–1.2)
Prothrombin Time: 14.1 seconds (ref 11.4–15.2)

## 2019-01-25 LAB — COMPREHENSIVE METABOLIC PANEL
ALT: 11 U/L (ref 0–44)
AST: 21 U/L (ref 15–41)
Albumin: 3.6 g/dL (ref 3.5–5.0)
Alkaline Phosphatase: 37 U/L — ABNORMAL LOW (ref 38–126)
Anion gap: 10 (ref 5–15)
BUN: <5 mg/dL — ABNORMAL LOW (ref 8–23)
CO2: 20 mmol/L — ABNORMAL LOW (ref 22–32)
Calcium: 9 mg/dL (ref 8.9–10.3)
Chloride: 99 mmol/L (ref 98–111)
Creatinine, Ser: 0.65 mg/dL (ref 0.61–1.24)
GFR calc Af Amer: >60 mL/min (ref >60)
GFR calc non Af Amer: >60 mL/min (ref >60)
Glucose, Bld: 79 mg/dL (ref 70–99)
Potassium: 4.3 mmol/L (ref 3.5–5.1)
Sodium: 129 mmol/L — ABNORMAL LOW (ref 135–145)
Total Bilirubin: 0.3 mg/dL (ref 0.3–1.2)
Total Protein: 6.3 g/dL — ABNORMAL LOW (ref 6.5–8.1)

## 2019-01-25 LAB — HEMOGLOBIN: Hemoglobin: 13.9 g/dL (ref 13.0–17.0)

## 2019-01-25 SURGERY — REPAIR, HERNIA, INGUINAL, ADULT
Anesthesia: General | Site: Groin | Laterality: Right

## 2019-01-25 MED ORDER — HYDROMORPHONE HCL 1 MG/ML IJ SOLN: 0.2500 mg | INTRAMUSCULAR | Status: DC | PRN

## 2019-01-25 MED ORDER — GABAPENTIN 300 MG PO CAPS
ORAL_CAPSULE | ORAL | Status: AC
  Administered 2019-01-25: 300 mg via ORAL
  Filled 2019-01-25: qty 1

## 2019-01-25 MED ORDER — PROPOFOL 10 MG/ML IV BOLUS
INTRAVENOUS | Status: DC | PRN
  Administered 2019-01-25: 200 mg via INTRAVENOUS

## 2019-01-25 MED ORDER — DEXAMETHASONE SODIUM PHOSPHATE 10 MG/ML IJ SOLN
INTRAMUSCULAR | Status: AC
  Filled 2019-01-25: qty 1

## 2019-01-25 MED ORDER — ROPIVACAINE HCL 5 MG/ML IJ SOLN
INTRAMUSCULAR | Status: DC | PRN
  Administered 2019-01-25: 30 mL via PERINEURAL

## 2019-01-25 MED ORDER — STERILE WATER FOR IRRIGATION IR SOLN
Status: DC | PRN
  Administered 2019-01-25: 1000 mL

## 2019-01-25 MED ORDER — ROCURONIUM BROMIDE 10 MG/ML (PF) SYRINGE
PREFILLED_SYRINGE | INTRAVENOUS | Status: AC
  Filled 2019-01-25: qty 10

## 2019-01-25 MED ORDER — DEXAMETHASONE SODIUM PHOSPHATE 10 MG/ML IJ SOLN
INTRAMUSCULAR | Status: DC | PRN
  Administered 2019-01-25: 5 mg via INTRAVENOUS

## 2019-01-25 MED ORDER — CHLORHEXIDINE GLUCONATE CLOTH 2 % EX PADS: 6.0000 | MEDICATED_PAD | Freq: Once | CUTANEOUS | Status: DC

## 2019-01-25 MED ORDER — EPHEDRINE 5 MG/ML INJ
INTRAVENOUS | Status: AC
  Filled 2019-01-25: qty 10

## 2019-01-25 MED ORDER — BUPIVACAINE-EPINEPHRINE 0.5% -1:200000 IJ SOLN
INTRAMUSCULAR | Status: DC | PRN
  Administered 2019-01-25: 15 mL

## 2019-01-25 MED ORDER — SUCCINYLCHOLINE CHLORIDE 200 MG/10ML IV SOSY
PREFILLED_SYRINGE | INTRAVENOUS | Status: AC
  Filled 2019-01-25: qty 10

## 2019-01-25 MED ORDER — PHENYLEPHRINE 40 MCG/ML (10ML) SYRINGE FOR IV PUSH (FOR BLOOD PRESSURE SUPPORT)
PREFILLED_SYRINGE | INTRAVENOUS | Status: DC | PRN
  Administered 2019-01-25: 80 ug via INTRAVENOUS

## 2019-01-25 MED ORDER — ACETAMINOPHEN 500 MG PO TABS
ORAL_TABLET | ORAL | Status: AC
  Administered 2019-01-25: 07:00:00 1000 mg via ORAL
  Filled 2019-01-25: qty 2

## 2019-01-25 MED ORDER — MIDAZOLAM HCL 2 MG/2ML IJ SOLN
2.0000 mg | Freq: Once | INTRAMUSCULAR | Status: AC
  Administered 2019-01-25: 08:00:00 2 mg via INTRAVENOUS

## 2019-01-25 MED ORDER — OXYCODONE HCL 5 MG/5ML PO SOLN: 5.0000 mg | Freq: Once | ORAL | Status: DC | PRN

## 2019-01-25 MED ORDER — LIDOCAINE 2% (20 MG/ML) 5 ML SYRINGE
INTRAMUSCULAR | Status: DC | PRN
  Administered 2019-01-25: 60 mg via INTRAVENOUS

## 2019-01-25 MED ORDER — CEFAZOLIN SODIUM-DEXTROSE 2-4 GM/100ML-% IV SOLN
INTRAVENOUS | Status: AC
  Filled 2019-01-25: qty 100

## 2019-01-25 MED ORDER — ONDANSETRON HCL 4 MG/2ML IJ SOLN
INTRAMUSCULAR | Status: AC
  Filled 2019-01-25: qty 2

## 2019-01-25 MED ORDER — OXYCODONE HCL 5 MG PO TABS: 5.0000 mg | ORAL_TABLET | Freq: Once | ORAL | Status: DC | PRN

## 2019-01-25 MED ORDER — EPHEDRINE SULFATE-NACL 50-0.9 MG/10ML-% IV SOSY
PREFILLED_SYRINGE | INTRAVENOUS | Status: DC | PRN
  Administered 2019-01-25: 10 mg via INTRAVENOUS
  Administered 2019-01-25: 15 mg via INTRAVENOUS

## 2019-01-25 MED ORDER — ONDANSETRON HCL 4 MG/2ML IJ SOLN
INTRAMUSCULAR | Status: DC | PRN
  Administered 2019-01-25: 4 mg via INTRAVENOUS

## 2019-01-25 MED ORDER — FENTANYL CITRATE (PF) 250 MCG/5ML IJ SOLN
INTRAMUSCULAR | Status: DC | PRN
  Administered 2019-01-25 (×2): 50 ug via INTRAVENOUS
  Administered 2019-01-25: 25 ug via INTRAVENOUS

## 2019-01-25 MED ORDER — PROMETHAZINE HCL 25 MG/ML IJ SOLN: 6.2500 mg | INTRAMUSCULAR | Status: DC | PRN

## 2019-01-25 MED ORDER — MIDAZOLAM HCL 2 MG/2ML IJ SOLN
INTRAMUSCULAR | Status: AC
  Filled 2019-01-25: qty 2

## 2019-01-25 MED ORDER — FENTANYL CITRATE (PF) 100 MCG/2ML IJ SOLN
INTRAMUSCULAR | Status: AC
  Administered 2019-01-25: 08:00:00 50 ug via INTRAVENOUS
  Filled 2019-01-25: qty 2

## 2019-01-25 MED ORDER — BUPIVACAINE-EPINEPHRINE (PF) 0.5% -1:200000 IJ SOLN
INTRAMUSCULAR | Status: AC
  Filled 2019-01-25: qty 30

## 2019-01-25 MED ORDER — PHENYLEPHRINE 40 MCG/ML (10ML) SYRINGE FOR IV PUSH (FOR BLOOD PRESSURE SUPPORT)
PREFILLED_SYRINGE | INTRAVENOUS | Status: AC
  Filled 2019-01-25: qty 10

## 2019-01-25 MED ORDER — LIDOCAINE 2% (20 MG/ML) 5 ML SYRINGE
INTRAMUSCULAR | Status: AC
  Filled 2019-01-25: qty 5

## 2019-01-25 MED ORDER — FENTANYL CITRATE (PF) 250 MCG/5ML IJ SOLN
INTRAMUSCULAR | Status: AC
  Filled 2019-01-25: qty 5

## 2019-01-25 MED ORDER — TRAMADOL HCL 50 MG PO TABS: 50.0000 mg | ORAL_TABLET | Freq: Four times a day (QID) | ORAL | 0 refills | Status: DC | PRN

## 2019-01-25 MED ORDER — 0.9 % SODIUM CHLORIDE (POUR BTL) OPTIME
TOPICAL | Status: DC | PRN
  Administered 2019-01-25: 1000 mL

## 2019-01-25 MED ORDER — MIDAZOLAM HCL 2 MG/2ML IJ SOLN
INTRAMUSCULAR | Status: AC
  Administered 2019-01-25: 08:00:00 2 mg via INTRAVENOUS
  Filled 2019-01-25: qty 2

## 2019-01-25 MED ORDER — ACETAMINOPHEN 500 MG PO TABS
1000.0000 mg | ORAL_TABLET | ORAL | Status: AC
  Administered 2019-01-25: 07:00:00 1000 mg via ORAL

## 2019-01-25 MED ORDER — PROPOFOL 10 MG/ML IV BOLUS
INTRAVENOUS | Status: AC
  Filled 2019-01-25: qty 20

## 2019-01-25 MED ORDER — FENTANYL CITRATE (PF) 100 MCG/2ML IJ SOLN
50.0000 ug | Freq: Once | INTRAMUSCULAR | Status: AC
  Administered 2019-01-25: 50 ug via INTRAVENOUS

## 2019-01-25 MED ORDER — MEPERIDINE HCL 25 MG/ML IJ SOLN: 6.2500 mg | INTRAMUSCULAR | Status: DC | PRN

## 2019-01-25 MED ORDER — LACTATED RINGERS IV SOLN
INTRAVENOUS | Status: DC
  Administered 2019-01-25: 08:00:00 via INTRAVENOUS

## 2019-01-25 MED ORDER — GABAPENTIN 300 MG PO CAPS
300.0000 mg | ORAL_CAPSULE | ORAL | Status: AC
  Administered 2019-01-25: 07:00:00 300 mg via ORAL

## 2019-01-25 MED ORDER — CEFAZOLIN SODIUM-DEXTROSE 2-4 GM/100ML-% IV SOLN
2.0000 g | INTRAVENOUS | Status: AC
  Administered 2019-01-25: 09:00:00 2 g via INTRAVENOUS

## 2019-01-25 SURGICAL SUPPLY — 33 items
BLADE CLIPPER SURG (BLADE) IMPLANT
CHLORAPREP W/TINT 26 (MISCELLANEOUS) ×2 IMPLANT
COVER SURGICAL LIGHT HANDLE (MISCELLANEOUS) ×2 IMPLANT
COVER WAND RF STERILE (DRAPES) IMPLANT
DERMABOND ADVANCED (GAUZE/BANDAGES/DRESSINGS) ×1
DERMABOND ADVANCED .7 DNX12 (GAUZE/BANDAGES/DRESSINGS) ×1 IMPLANT
DRAIN PENROSE 1/2X12 LTX STRL (WOUND CARE) IMPLANT
DRAPE LAPAROTOMY TRNSV 102X78 (DRAPES) ×2 IMPLANT
ELECT REM PT RETURN 9FT ADLT (ELECTROSURGICAL) ×2
ELECTRODE REM PT RTRN 9FT ADLT (ELECTROSURGICAL) ×1 IMPLANT
GLOVE SURG SIGNA 7.5 PF LTX (GLOVE) ×2 IMPLANT
GOWN STRL REUS W/ TWL LRG LVL3 (GOWN DISPOSABLE) ×1 IMPLANT
GOWN STRL REUS W/ TWL XL LVL3 (GOWN DISPOSABLE) ×1 IMPLANT
GOWN STRL REUS W/TWL LRG LVL3 (GOWN DISPOSABLE) ×1
GOWN STRL REUS W/TWL XL LVL3 (GOWN DISPOSABLE) ×1
KIT BASIN OR (CUSTOM PROCEDURE TRAY) ×2 IMPLANT
KIT TURNOVER KIT B (KITS) ×2 IMPLANT
MESH PARIETEX PROGRIP RIGHT (Mesh General) ×1 IMPLANT
NDL HYPO 25GX1X1/2 BEV (NEEDLE) ×1 IMPLANT
NEEDLE HYPO 25GX1X1/2 BEV (NEEDLE) ×2 IMPLANT
NS IRRIG 1000ML POUR BTL (IV SOLUTION) ×2 IMPLANT
PACK GENERAL/GYN (CUSTOM PROCEDURE TRAY) ×2 IMPLANT
PAD ARMBOARD 7.5X6 YLW CONV (MISCELLANEOUS) ×2 IMPLANT
PENCIL SMOKE EVACUATOR (MISCELLANEOUS) ×2 IMPLANT
SUT MON AB 4-0 PC3 18 (SUTURE) ×2 IMPLANT
SUT SILK 2 0 SH (SUTURE) ×1 IMPLANT
SUT VIC AB 2-0 CT1 27 (SUTURE) ×1
SUT VIC AB 2-0 CT1 TAPERPNT 27 (SUTURE) ×1 IMPLANT
SUT VIC AB 3-0 CT1 27 (SUTURE) ×1
SUT VIC AB 3-0 CT1 TAPERPNT 27 (SUTURE) ×1 IMPLANT
SYR CONTROL 10ML LL (SYRINGE) ×2 IMPLANT
TOWEL GREEN STERILE (TOWEL DISPOSABLE) ×2 IMPLANT
TOWEL GREEN STERILE FF (TOWEL DISPOSABLE) ×2 IMPLANT

## 2019-01-25 NOTE — Anesthesia Procedure Notes (Signed)
Procedure Name: LMA Insertion Date/Time: 01/25/2019 8:41 AM Performed by: Elayne Snare, CRNA Pre-anesthesia Checklist: Patient identified, Emergency Drugs available, Suction available and Patient being monitored Patient Re-evaluated:Patient Re-evaluated prior to induction Oxygen Delivery Method: Circle System Utilized Preoxygenation: Pre-oxygenation with 100% oxygen Induction Type: IV induction Ventilation: Mask ventilation without difficulty LMA: LMA inserted LMA Size: 4.0 Tube size: 4.0 mm Number of attempts: 1 Airway Equipment and Method: Bite block Placement Confirmation: positive ETCO2 Tube secured with: Tape Dental Injury: Teeth and Oropharynx as per pre-operative assessment

## 2019-01-25 NOTE — Discharge Instructions (Signed)
[facility] _____[facility]  UMBILICAL OR INGUINAL HERNIA REPAIR: POST OP INSTRUCTIONS  Always review your discharge instruction sheet given to you by the facility where your surgery was performed. IF YOU HAVE DISABILITY OR FAMILY LEAVE FORMS, YOU MUST BRING THEM TO THE OFFICE FOR PROCESSING.   DO NOT GIVE THEM TO YOUR DOCTOR.  1. A  prescription for pain medication may be given to you upon discharge.  Take your pain medication as prescribed, if needed.  If narcotic pain medicine is not needed, then you may take acetaminophen (Tylenol) or ibuprofen (Advil) as needed. 2. Take your usually prescribed medications unless otherwise directed. If you need a refill on your pain medication, please contact your pharmacy.  They will contact our office to request authorization. Prescriptions will not be filled after 5 pm or on week-ends. 3. You should follow a light diet the first 24 hours after arrival home, such as soup and crackers, etc.  Be sure to include lots of fluids daily.  Resume your normal diet the day after surgery. 4.Most patients will experience some swelling and bruising around the umbilicus or in the groin and scrotum.  Ice packs and reclining will help.  Swelling and bruising can take several days to resolve.  6. It is common to experience some constipation if taking pain medication after surgery.  Increasing fluid intake and taking a stool softener (such as Colace) will usually help or prevent this problem from occurring.  A mild laxative (Milk of Magnesia or Miralax) should be taken according to package directions if there are no bowel movements after 48 hours. 7. Unless discharge instructions indicate otherwise, you may remove your bandages 24-48 hours after surgery, and you may shower at that time.  You may have steri-strips (small skin tapes) in place directly over the incision.  These strips should be left on the skin for 7-10 days.  If your surgeon used skin glue on the  incision, you may shower in 24 hours.  The glue will flake off over the next 2-3 weeks.  Any sutures or staples will be removed at the office during your follow-up visit. 8. ACTIVITIES:  You may resume regular (light) daily activities beginning the next day--such as daily self-care, walking, climbing stairs--gradually increasing activities as tolerated.  You may have sexual intercourse when it is comfortable.  Refrain from any heavy lifting or straining until approved by your doctor.  a.You may drive when you are no longer taking prescription pain medication, you can comfortably wear a seatbelt, and you can safely maneuver your car and apply brakes. b.RETURN TO WORK:   _____________________________________________  9.You should see your doctor in the office for a follow-up appointment approximately 2-3 weeks after your surgery.  Make sure that you call for this appointment within a day or two after you arrive home to insure a convenient appointment time. 10.OTHER INSTRUCTIONS: __Okay to shower starting tomorrow  No lifting over 15 to 20 pounds for 4 weeks  You may use Tylenol, ibuprofen, and an ice pack also for pain _______________________    _____________________________________  WHEN TO CALL YOUR DOCTOR: 1. Fever over 101.0 2. Inability to urinate 3. Nausea and/or vomiting 4. Extreme swelling or bruising 5. Continued bleeding from incision. 6. Increased pain, redness, or drainage from the incision  The clinic staff is available to answer your questions during regular business hours.  Please dont hesitate to call and ask to speak to one of the nurses for clinical concerns.  If you  have a medical emergency, go to the nearest emergency room or call 911.  A surgeon from [facility] is always on call at the hospital   [address] [place], [place], [place]  [address] ?  [address] [place], [place], [place]   [address] [phone] ? [phone] ? FAX [phone] Web  site: [email or web address]

## 2019-01-25 NOTE — Anesthesia Postprocedure Evaluation (Signed)
Anesthesia Post Note  Patient: Christopher Harding  Procedure(s) Performed: RIGHT INGUINAL HERNIA REPAIR WITH MESH (Right Groin) Insertion Of Mesh (Right Groin)     Patient location during evaluation: PACU Anesthesia Type: General Level of consciousness: awake and alert Pain management: pain level controlled Vital Signs Assessment: post-procedure vital signs reviewed and stable Respiratory status: spontaneous breathing, nonlabored ventilation and respiratory function stable Cardiovascular status: blood pressure returned to baseline and stable Postop Assessment: no apparent nausea or vomiting Anesthetic complications: no    Last Vitals:  Vitals:   01/25/19 0938 01/25/19 0945  BP: 130/74   Pulse: 71 (!) 58  Resp: 20 16  Temp:  (!) 36.4 C  SpO2: 100% 100%    Last Pain:  Vitals:   01/25/19 0945  PainSc: 0-No pain                 Lynda Rainwater

## 2019-01-25 NOTE — Transfer of Care (Signed)
Immediate Anesthesia Transfer of Care Note  Patient: Christopher Harding  Procedure(s) Performed: RIGHT INGUINAL HERNIA REPAIR WITH MESH (Right Groin) Insertion Of Mesh (Right Groin)  Patient Location: PACU  Anesthesia Type:GA combined with regional for post-op pain  Level of Consciousness: awake, alert  and patient cooperative  Airway & Oxygen Therapy: Patient Spontanous Breathing  Post-op Assessment: Report given to RN and Post -op Vital signs reviewed and stable  Post vital signs: Reviewed and stable  Last Vitals:  Vitals Value Taken Time  BP 115/54 01/25/19 0923  Temp 36.4 C 01/25/19 0923  Pulse 70 01/25/19 0925  Resp 17 01/25/19 0925  SpO2 100 % 01/25/19 0925  Vitals shown include unvalidated device data.  Last Pain:  Vitals:   01/25/19 0923  PainSc: 0-No pain      Patients Stated Pain Goal: 1 (46/04/79 [id number])  Complications: No apparent anesthesia complications

## 2019-01-25 NOTE — Op Note (Signed)
RIGHT INGUINAL HERNIA REPAIR WITH MESH, Insertion Of Mesh  Procedure Note  Christopher Harding 01/25/2019   Pre-op Diagnosis: RIGHT INGUINAL HERNIA     Post-op Diagnosis: same  Procedure(s): RIGHT INGUINAL HERNIA REPAIR WITH MESH Insertion Of Mesh  Surgeon(s): Coralie Keens, MD  Anesthesia: General  Staff:  Circulator: Rosanne Sack, RN Relief Scrub: Ulyses Amor, Melissa S Scrub Person: Rozell Searing, RN  Estimated Blood Loss: Minimal               Findings: The patient was found to have a large indirect right inguinal hernia which was repaired with a piece of large Prolene pro-grip mesh  Procedure: The patient was brought to the operating room and identifies the correct patient.  He is placed upon the operating table general anesthesia was induced.  He had already had a tap block by anesthesia.  His right lower quadrant was prepped and draped in usual sterile fashion.  I anesthetized skin with Marcaine and made a longitudinal incision with a scalpel.  I took this down through Scarpa's fascia with the cautery.  The external beak fascia was was then identified and opened toward the internal and external rings.  The testicular cord and structures were then controlled with a Penrose drain.  The patient had a very large thickened indirect hernia sac.  I separated the sac from the cord structures.  All contents were reduced after I opened the sac.  I then tied off the base of the sac with a 2-0 silk suture.  I then excised the redundant sac.  A piece of pro-grip Prolene mesh was brought to the field.  I placed that against the pubic tubercle and then brought around the cord structures.  I then sutured in place with a 2-0 Vicryl suture.  Wide coverage of the inguinal floor and internal ring appeared to be achieved.  I then closed externally fascia over the top of this with a running 2-0 Vicryl suture.  I anesthetized the incision further with Marcaine.  Hemostasis appeared to be  achieved.  I then closed Scarpa's fascia with interrupted 3-0 Vicryl sutures and closed the skin with a running 4-0 Monocryl.  Dermabond was then applied.  The patient tolerated the procedure well.  All the counts were correct at the end of the procedure.  The patient was then extubated in the operating room and taken in a stable condition to the recovery room.          Coralie Keens   Date: 01/25/2019  Time: 9:19 AM

## 2019-01-25 NOTE — Interval H&P Note (Signed)
History and Physical Interval Note:no change in H and P  01/25/2019 8:13 AM  [name]  has presented today for surgery, with the diagnosis of RIGHT INGUINAL HERNIA.  The various methods of treatment have been discussed with the patient and family. After consideration of risks, benefits and other options for treatment, the patient has consented to  Procedure(s) with comments: [place] (Right) - TAP BLOCK as a surgical intervention.  The patient's history has been reviewed, patient examined, no change in status, stable for surgery.  I have reviewed the patient's chart and labs.  Questions were answered to the patient's satisfaction.     Coralie Keens

## 2019-01-25 NOTE — Anesthesia Procedure Notes (Signed)
Anesthesia Regional Block: TAP block   Pre-Anesthetic Checklist: ,, timeout performed, Correct Patient, Correct Site, Correct Laterality, Correct Procedure, Correct Position, site marked, Risks and benefits discussed,  Surgical consent,  Pre-op evaluation,  At surgeon's request and post-op pain management  Laterality: Right  Prep: chloraprep       Needles:  Injection technique: Single-shot  Needle Type: Stimiplex     Needle Length: 9cm  Needle Gauge: 21     Additional Needles:   Procedures:,,,, ultrasound used (permanent image in chart),,,,  Narrative:  Start time: 01/25/2019 8:17 AM End time: 01/25/2019 8:22 AM Injection made incrementally with aspirations every 5 mL.  Performed by: Personally  Anesthesiologist: Lynda Rainwater, MD

## 2019-01-26 ENCOUNTER — Encounter (HOSPITAL_COMMUNITY): Payer: Self-pay | Admitting: Surgery

## 2019-02-02 NOTE — Telephone Encounter (Signed)
Pt presented for appt with [name] 7/22

## 2019-07-29 ENCOUNTER — Ambulatory Visit: Payer: Medicare Other | Attending: Internal Medicine

## 2019-07-29 DIAGNOSIS — Z23 Encounter for immunization: Secondary | ICD-10-CM | POA: Insufficient documentation

## 2019-07-29 NOTE — Progress Notes (Signed)
   Covid-19 Vaccination Clinic  Name:  EDRAS WILFORD    MRN: [id number] DOB: Jan 07, 1951  07/29/2019  Mr. Vantine was observed post Covid-19 immunization for 15 minutes without incidence. He was provided with Vaccine Information Sheet and instruction to access the V-Safe system.   Mr. Bobrowski was instructed to call 911 with any severe reactions post vaccine: Marland Kitchen Difficulty breathing  . Swelling of your face and throat  . A fast heartbeat  . A bad rash all over your body  . Dizziness and weakness    Immunizations Administered    Name Date Dose VIS Date Route   Pfizer COVID-19 Vaccine 07/29/2019  2:53 PM 0.3 mL 05/18/2019 Intramuscular   Manufacturer: [place]   Lot: [id number]   [place]: [id number]

## 2019-08-08 DIAGNOSIS — I10 Essential (primary) hypertension: Secondary | ICD-10-CM | POA: Diagnosis not present

## 2019-08-08 DIAGNOSIS — Z0001 Encounter for general adult medical examination with abnormal findings: Secondary | ICD-10-CM | POA: Diagnosis not present

## 2019-08-08 DIAGNOSIS — F1020 Alcohol dependence, uncomplicated: Secondary | ICD-10-CM | POA: Diagnosis not present

## 2019-08-08 DIAGNOSIS — I482 Chronic atrial fibrillation, unspecified: Secondary | ICD-10-CM | POA: Diagnosis not present

## 2019-08-22 ENCOUNTER — Telehealth: Payer: Self-pay | Admitting: Acute Care

## 2019-08-22 ENCOUNTER — Ambulatory Visit: Payer: Medicare Other | Attending: Internal Medicine

## 2019-08-22 DIAGNOSIS — F17210 Nicotine dependence, cigarettes, uncomplicated: Secondary | ICD-10-CM

## 2019-08-22 DIAGNOSIS — Z23 Encounter for immunization: Secondary | ICD-10-CM

## 2019-08-22 DIAGNOSIS — Z87891 Personal history of nicotine dependence: Secondary | ICD-10-CM

## 2019-08-22 NOTE — Progress Notes (Signed)
   Covid-19 Vaccination Clinic  Name:  LAYN KYE    MRN: [id number] DOB: 1951-05-20  08/22/2019  Mr. Butcher was observed post Covid-19 immunization for 15 minutes without incident. He was provided with Vaccine Information Sheet and instruction to access the V-Safe system.   Mr. Poirier was instructed to call 911 with any severe reactions post vaccine: Marland Kitchen Difficulty breathing  . Swelling of face and throat  . A fast heartbeat  . A bad rash all over body  . Dizziness and weakness   Immunizations Administered    Name Date Dose VIS Date Route   Pfizer COVID-19 Vaccine 08/22/2019  1:21 PM 0.3 mL 05/18/2019 Intramuscular   Manufacturer: [place]   Lot: [id number]   [place]: [id number]

## 2019-08-23 NOTE — Telephone Encounter (Signed)
LMTC x 1  

## 2019-08-23 NOTE — Telephone Encounter (Signed)
[name] wife is returning phone call. [name] phone number is [phone].

## 2019-08-23 NOTE — Telephone Encounter (Signed)
Spoke with pt's wife regarding starting pt in lung cancer screening. I gave her the billing codes that we use for the [facility] and CT. She is going to call [employer] to check coverage and call me back. Will await call back.

## 2019-08-23 NOTE — Telephone Encounter (Signed)
PT scheduled for [facility] 10/01/19 2:30 CT ordered Nothing further needed

## 2019-09-03 DIAGNOSIS — Z125 Encounter for screening for malignant neoplasm of prostate: Secondary | ICD-10-CM | POA: Diagnosis not present

## 2019-09-03 DIAGNOSIS — Z79899 Other long term (current) drug therapy: Secondary | ICD-10-CM | POA: Diagnosis not present

## 2019-09-03 DIAGNOSIS — E7800 Pure hypercholesterolemia, unspecified: Secondary | ICD-10-CM | POA: Diagnosis not present

## 2019-09-03 DIAGNOSIS — Z0001 Encounter for general adult medical examination with abnormal findings: Secondary | ICD-10-CM | POA: Diagnosis not present

## 2019-10-01 ENCOUNTER — Ambulatory Visit
Admission: RE | Admit: 2019-10-01 | Discharge: 2019-10-01 | Disposition: A | Discharge status: Home / Self Care | Payer: Medicare Other | Source: Ambulatory Visit | Attending: Acute Care | Admitting: Acute Care

## 2019-10-01 ENCOUNTER — Other Ambulatory Visit: Payer: Self-pay

## 2019-10-01 ENCOUNTER — Ambulatory Visit (INDEPENDENT_AMBULATORY_CARE_PROVIDER_SITE_OTHER): Payer: Medicare Other | Admitting: Acute Care

## 2019-10-01 ENCOUNTER — Encounter: Payer: Self-pay | Admitting: Acute Care

## 2019-10-01 DIAGNOSIS — Z7189 Other specified counseling: Secondary | ICD-10-CM | POA: Diagnosis not present

## 2019-10-01 DIAGNOSIS — F17210 Nicotine dependence, cigarettes, uncomplicated: Secondary | ICD-10-CM

## 2019-10-01 DIAGNOSIS — Z87891 Personal history of nicotine dependence: Secondary | ICD-10-CM

## 2019-10-01 DIAGNOSIS — Z122 Encounter for screening for malignant neoplasm of respiratory organs: Secondary | ICD-10-CM

## 2019-10-01 DIAGNOSIS — Z716 Tobacco abuse counseling: Secondary | ICD-10-CM | POA: Diagnosis not present

## 2019-10-01 IMAGING — CT CT CHEST LUNG CANCER SCREENING LOW DOSE W/O CM
1 of 2 series · 10 of 40 positions shown, 13 images · non-contrast
Comparison: None.

CLINICAL DATA: 69-year-old asymptomatic male current smoker with 30
pack-year smoking history.

EXAM:
CT CHEST WITHOUT CONTRAST LOW-DOSE FOR LUNG CANCER SCREENING
TECHNIQUE: Multidetector CT imaging of the chest was performed following the
standard protocol without IV contrast.

[ct lung segmentation data · axial · 0.73mm/px · z∈[+927,+927]mm · 10 of 334 frames shown]
[frame 1/334  mediastinal]
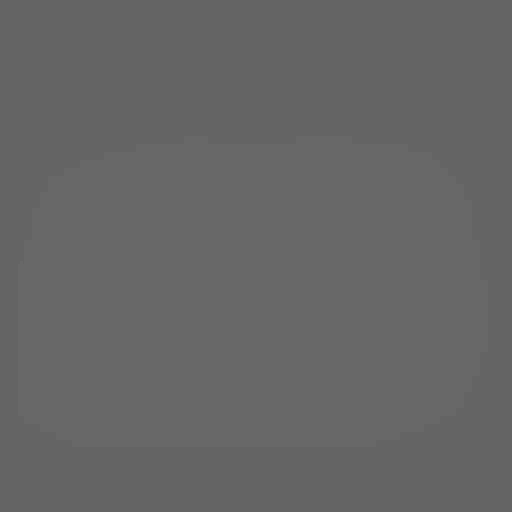
[frame 1/334  lung]
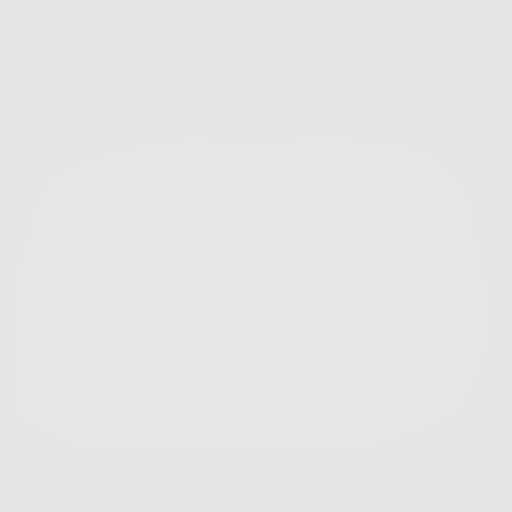
[frame 38/334  lung]
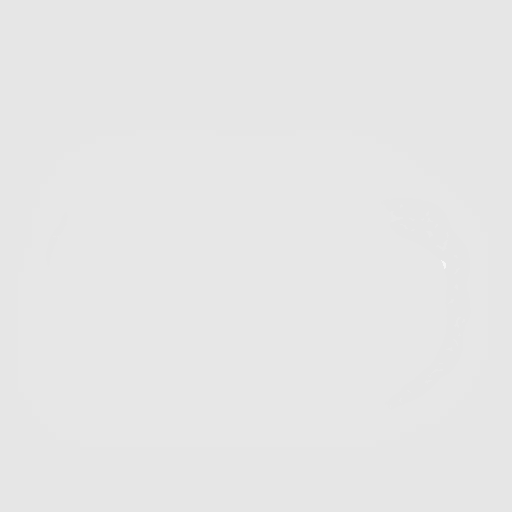
[frame 75/334  lung]
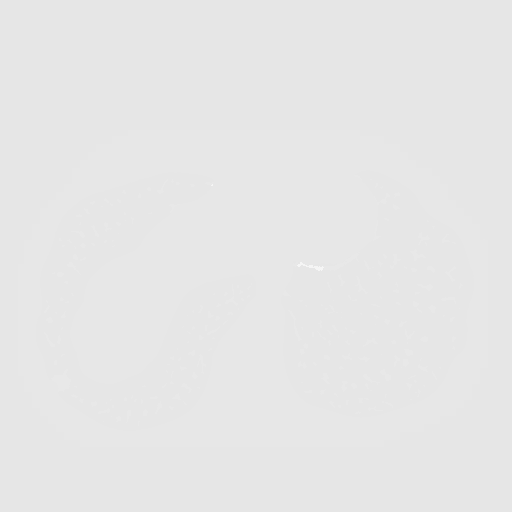
[frame 112/334  lung]
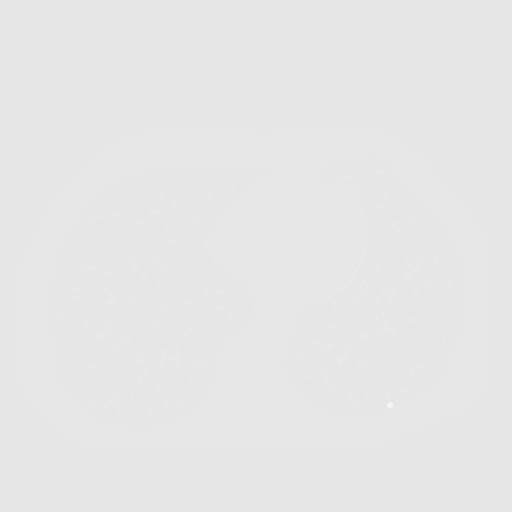
[frame 149/334  mediastinal]
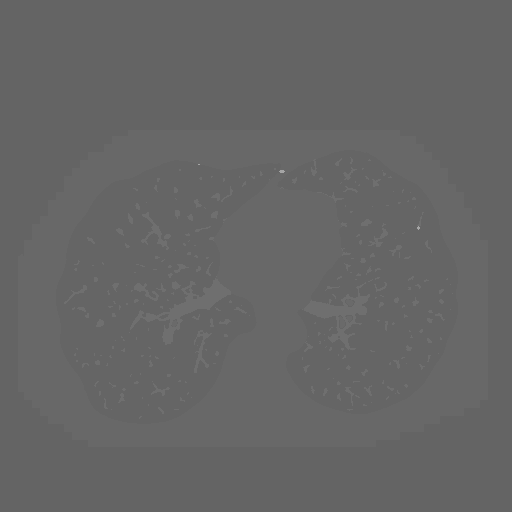
[frame 149/334  lung]
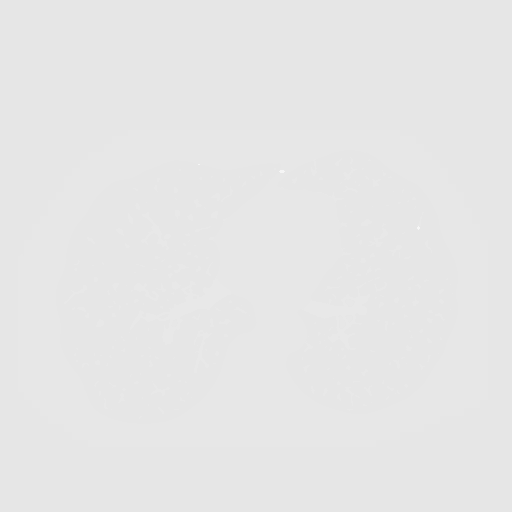
[frame 186/334  lung]
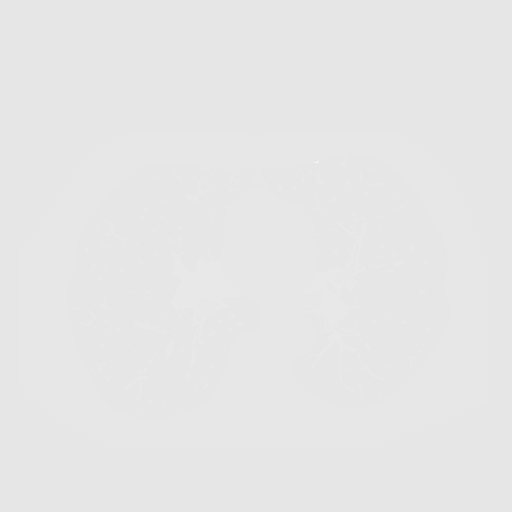
[frame 223/334  lung]
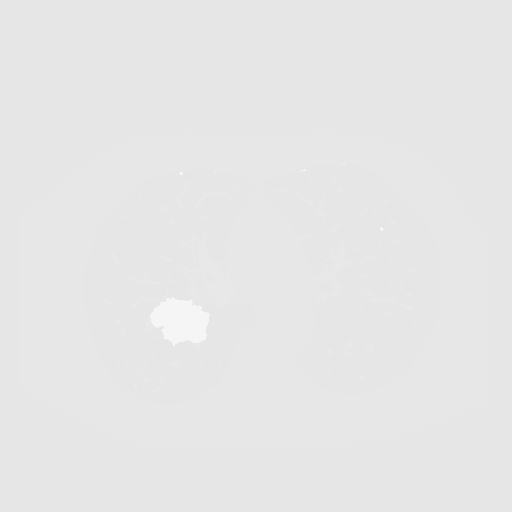
[frame 260/334  lung]
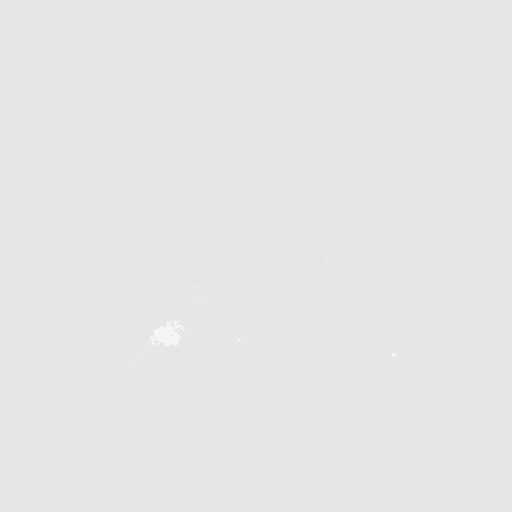
[frame 297/334  mediastinal]
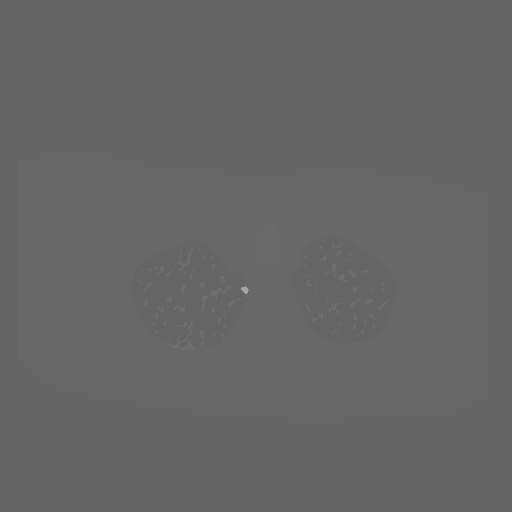
[frame 297/334  lung]
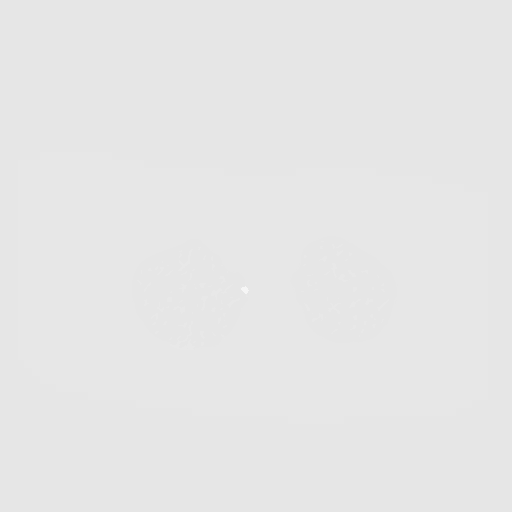
[frame 334/334  lung]
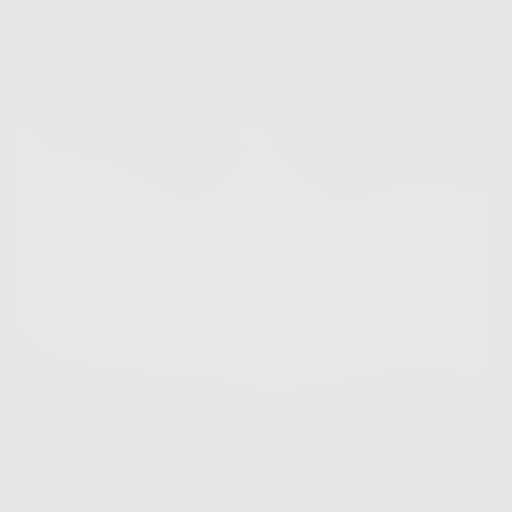

[10 of 40 positions shown; findings below may reference images not displayed]

FINDINGS: Cardiovascular: Normal heart size. No significant pericardial
effusion/thickening. Left anterior descending coronary
atherosclerosis. Atherosclerotic thoracic aorta with ectatic 4.1 cm
ascending thoracic aorta. Normal caliber pulmonary arteries.

Mediastinum/Nodes: No discrete thyroid nodules. Unremarkable
esophagus. No pathologically enlarged axillary, mediastinal or hilar
lymph nodes, noting limited sensitivity for the detection of hilar
adenopathy on this noncontrast study.

Lungs/Pleura: No pneumothorax. No pleural effusion. Mild
centrilobular and paraseptal emphysema with diffuse bronchial wall
thickening. There is an irregular solid posterior right upper lobe
lung mass measuring 48.6 mm in volume derived mean diameter (series
3/image 95) with direct extension across the major fissure into the
superior segment right lower lobe. Additional scattered pulmonary
nodules in both lungs, largest a non solid posterior right lower
lobe nodule measuring 12.7 mm in volume derived mean diameter
(series 3/image 134).

Upper abdomen: No acute abnormality.

Musculoskeletal: No aggressive appearing focal osseous lesions.
Minimal thoracic spondylosis.
IMPRESSION: 1. Lung-RADS 4B, suspicious. Irregular solid 48.6 mm posterior right
upper lobe lung mass with direct extension across the major fissure
into the superior segment right lower lobe, highly suspicious for
primary bronchogenic carcinoma. Additional imaging evaluation or
consultation with Pulmonology or Thoracic Surgery recommended.
2. One vessel coronary atherosclerosis.
3. Ectatic 4.1 cm ascending thoracic aorta. Recommend annual imaging
followup by CTA or MRA. This recommendation follows [P8]
ACCF/AHA/AATS/ACR/ASA/SCA/CHILDERS/CHILDERS/CHILDERS/CHILDERS Guidelines for the
Diagnosis and Management of Patients with Thoracic Aortic Disease.
Circulation. [P8]; 121: E266-e369. Aortic aneurysm NOS ([P8]-[P8])
4. Aortic Atherosclerosis ([P8]-[P8]) and Emphysema ([P8]-[P8]).

These results will be called to the ordering clinician or
representative by the Radiologist Assistant, and communication
documented in the PACS or [REDACTED].

## 2019-10-01 NOTE — Progress Notes (Signed)
Shared Decision Making Visit Lung Cancer Screening Program [phone])   Eligibility:  Age 69 y.o.  Pack Years Smoking History Calculation 35 pack year smoking hstory (# packs/per year x # years smoked)  Recent History of coughing up blood  no  Unexplained weight loss? no ( >Than 15 pounds within the last 6 months )  Prior History Lung / other cancer no (Diagnosis within the last 5 years already requiring surveillance chest CT Scans).  Smoking Status Current Smoker  Former Smokers: Years since quit: NA  Quit Date: NA  Visit Components:  Discussion included one or more decision making aids. yes  Discussion included risk/benefits of screening. yes  Discussion included potential follow up diagnostic testing for abnormal scans. yes  Discussion included meaning and risk of over diagnosis. yes  Discussion included meaning and risk of False Positives. yes  Discussion included meaning of total radiation exposure. yes  Counseling Included:  Importance of adherence to annual lung cancer LDCT screening. yes  Impact of comorbidities on ability to participate in the program. yes  Ability and willingness to under diagnostic treatment. yes  Smoking Cessation Counseling:  Current Smokers:   Discussed importance of smoking cessation. yes  Information about tobacco cessation classes and interventions provided to patient. yes  Patient provided with "ticket" for LDCT Scan. yes  Symptomatic Patient. no  Counseling  Diagnosis Code: Tobacco Use Z72.0  Asymptomatic Patient yes  Counseling (Intermediate counseling: > three minutes counseling) E5631  Former Smokers:   Discussed the importance of maintaining cigarette abstinence. yes  Diagnosis Code: Personal History of Nicotine Dependence. S97.026  Information about tobacco cessation classes and interventions provided to patient. Yes  Patient provided with "ticket" for LDCT Scan. yes  Written Order for Lung Cancer  Screening with LDCT placed in Epic. Yes (CT Chest Lung Cancer Screening Low Dose W/O CM) [id number] Z12.2-Screening of respiratory organs Z87.891-Personal history of nicotine dependence  I have spent 25 minutes of face to face time with Mr. Roots discussing the risks and benefits of lung cancer screening. We viewed a power point together that explained in detail the above noted topics. We paused at intervals to allow for questions to be asked and answered to ensure understanding.We discussed that the single most powerful action that he can take to decrease his risk of developing lung cancer is to quit smoking. We discussed whether or not he is ready to commit to setting a quit date. We discussed options for tools to aid in quitting smoking including nicotine replacement therapy, non-nicotine medications, support groups, Quit Smart classes, and behavior modification. We discussed that often times setting smaller, more achievable goals, such as eliminating 1 cigarette a day for a week and then 2 cigarettes a day for a week can be helpful in slowly decreasing the number of cigarettes smoked. This allows for a sense of accomplishment as well as providing a clinical benefit. I gave him the " Be Stronger Than Your Excuses" card with contact information for community resources, classes, free nicotine replacement therapy, and access to mobile apps, text messaging, and on-line smoking cessation help. I have also given him my card and contact information in the event he needs to contact me. We discussed the time and location of the scan, and that either Doroteo Glassman RN or I will call with the results within 24-48 hours of receiving them. I have offered him  a copy of the power point we viewed  as a resource in the event they need reinforcement of  the concepts we discussed today in the office. The patient verbalized understanding of all of  the above and had no further questions upon leaving the office. They have my  contact information in the event they have any further questions.  I spent 3 minutes counseling on smoking cessation and the health risks of continued tobacco abuse.  I explained to the patient that there has been a high incidence of coronary artery disease noted on these exams. I explained that this is a non-gated exam therefore degree or severity cannot be determined. This patient is  not on statin therapy. I have asked the patient to follow-up with their PCP regarding any incidental finding of coronary artery disease and management with diet or medication as their PCP  feels is clinically indicated. The patient verbalized understanding of the above and had no further questions upon completion of the visit.      Magdalen Spatz, NP 10/01/2019

## 2019-10-01 NOTE — Patient Instructions (Signed)
Thank you for participating in the [facility]. It was our pleasure to meet you today. We will call you with the results of your scan within the next few days. Your scan will be assigned a Lung RADS category score by the physicians reading the scans.  This Lung RADS score determines follow up scanning.  See below for description of categories, and follow up screening recommendations. We will be in touch to schedule your follow up screening annually or based on recommendations of our providers. We will fax a copy of your scan results to your Primary Care Physician, or the physician who referred you to the program, to ensure they have the results. Please call the office if you have any questions or concerns regarding your scanning experience or results.  Our office number is [phone]. Please speak with Denise Phelps, RN. She is our Lung Cancer Screening RN. If she is unavailable when you call, please have the office staff send her a message. She will return your call at her earliest convenience. Remember, if your scan is normal, we will scan you annually as long as you continue to meet the criteria for the program. (Age 55-77, Current smoker or smoker who has quit within the last 15 years). If you are a smoker, remember, quitting is the single most powerful action that you can take to decrease your risk of lung cancer and other pulmonary, breathing related problems. We know quitting is hard, and we are here to help.  Please let us know if there is anything we can do to help you meet your goal of quitting. If you are a former smoker, congratulations. We are proud of you! Remain smoke free! Remember you can refer friends or family members through the number above.  We will screen them to make sure they meet criteria for the program. Thank you for helping us take better care of you by participating in Lung Screening.  Lung RADS Categories:  Lung RADS 1: no nodules  or definitely non-concerning nodules.  Recommendation is for a repeat annual scan in 12 months.  Lung RADS 2:  nodules that are non-concerning in appearance and behavior with a very low likelihood of becoming an active cancer. Recommendation is for a repeat annual scan in 12 months.  Lung RADS 3: nodules that are probably non-concerning , includes nodules with a low likelihood of becoming an active cancer.  Recommendation is for a 6-month repeat screening scan. Often noted after an upper respiratory illness. We will be in touch to make sure you have no questions, and to schedule your 6-month scan.  Lung RADS 4 A: nodules with concerning findings, recommendation is most often for a follow up scan in 3 months or additional testing based on our provider's assessment of the scan. We will be in touch to make sure you have no questions and to schedule the recommended 3 month follow up scan.  Lung RADS 4 B:  indicates findings that are concerning. We will be in touch with you to schedule additional diagnostic testing based on our provider's  assessment of the scan.   

## 2019-10-02 ENCOUNTER — Telehealth: Payer: Self-pay | Admitting: Acute Care

## 2019-10-02 NOTE — Telephone Encounter (Signed)
Received call report from [facility] Radiology on patient's LDCT done on 10/01/2019. Sarah please review the result/impression copied below:  IMPRESSION: 1. Lung-RADS 4B, suspicious. Irregular solid 48.6 mm posterior right upper lobe lung mass with direct extension across the major fissure into the superior segment right lower lobe, highly suspicious for primary bronchogenic carcinoma. Additional imaging evaluation or consultation with Pulmonology or Thoracic Surgery recommended. 2. One vessel coronary atherosclerosis. 3. Ectatic 4.1 cm ascending thoracic aorta. Recommend annual imaging followup by CTA or MRA. This recommendation follows 2010 ACCF/AHA/AATS/ACR/ASA/SCA/SCAI/SIR/STS/SVM Guidelines for the Diagnosis and Management of Patients with Thoracic Aortic Disease. Circulation. 2010; 121: I343-B357. Aortic aneurysm NOS (ICD10-I71.9) 4. Aortic Atherosclerosis (ICD10-I70.0) and Emphysema (ICD10-J43.9).  Please advise, thank you.

## 2019-10-04 NOTE — Progress Notes (Signed)
I have called and spoken to the patient's wife [name]. She is cleared for release of  patient's healthcare information. [place] the [place] and [place]. I explained that Christopher Harding's LDCT was abnormal, and that we would need to do some follow up diagnostic testing. I asked if we could schedule the patient to be seen 5/5/at 3:30 in the office with me. She agreed. We are scheduled then to review the patient's scan and go over further diagnostic testing. I will go ahead and order the PFT's. He will need EBUS and ENB, so we will also need a super D CT. I will place these orders after meeting with the patient 10/10/2019. [name] verbalized understanding. I am going to call [name] 4/30 to further explain, as he will be available tomorrow.

## 2019-10-04 NOTE — Progress Notes (Signed)
I have attempted to call the patient with the results of the low dose CT. There was no answer. I have left a HIPPA compliant message with the office contact number requesting that he call the office for the results of his scan. If we do not hear from him today, we will try again tomorrow 4/29

## 2019-10-04 NOTE — Telephone Encounter (Signed)
This scan has been discussed with Dr. Lamonte Sakai who will help determine plan of care this morning. Once we have determined plan of care I will call the patient.

## 2019-10-05 ENCOUNTER — Telehealth: Payer: Self-pay | Admitting: Acute Care

## 2019-10-05 NOTE — Telephone Encounter (Signed)
I have attempted to call the patient again today to discuss the results of his low dose CT. There was no answer. I will try again later today. The patient is scheduled to see me 10/10/2019 in the office.

## 2019-10-10 ENCOUNTER — Encounter: Payer: Self-pay | Admitting: Acute Care

## 2019-10-10 ENCOUNTER — Other Ambulatory Visit: Payer: Self-pay

## 2019-10-10 ENCOUNTER — Ambulatory Visit: Payer: Medicare Other | Admitting: Acute Care

## 2019-10-10 VITALS — BP 140/82 | HR 55 | Temp 98.1°F | Ht 74.0 in | Wt 169.6 lb

## 2019-10-10 DIAGNOSIS — F17210 Nicotine dependence, cigarettes, uncomplicated: Secondary | ICD-10-CM | POA: Diagnosis not present

## 2019-10-10 DIAGNOSIS — R911 Solitary pulmonary nodule: Secondary | ICD-10-CM | POA: Diagnosis not present

## 2019-10-10 NOTE — Progress Notes (Signed)
History of Present Illness Christopher Harding is a 69 y.o. male current every day smoker ( 35 pack year smoking history) with baseline LDCT  finding suspicious. for primary bronchogenic carcinoma. He is followed through the lung screening program. Dr. Lamonte Sakai has reviewed the patient scan.    10/10/2019 Follow up Abnormal baseline LDCT >> LR 4B Pt. Presents for follow up. He was notified by phone of the new finding of a mass in his right upper/ right lower lobe. He verbalizes that he is nervous. I reviewed the scan images with the patient. I explained that I have reviewed his scan results with Dr. Baltazar Apo.  I told him that the next step would be to get a tissue sample of the mass to determine exactly what it is.  I did explain that we were concerned that this was bronchogenic cancer.  I explained that the plan would be for ENB/EBUS for tissue sampling.  I also explained that we would need to obtain pulmonary function tests, and a super D CT prior to the procedure. The patient is on Xarelto, and he will need to have this medication held for 1 to 2 days prior to the procedure. He is planning on going to visit his daughter in [place] and would prefer to wait until he returns home to have the procedure done.  He is only going to be gone for about 4 days, so we could get him scheduled mid-May. I did explain that we would need to go ahead and get the pulmonary function tests and the super D CT as soon as possible.  He is in agreement with getting that done prior to going to [place] for his vacation.  The patient has had both Covid Vaccines, second dose was administered 08/22/2019  He is on [place] for atrial fibrillation  The patient denies coughing up blood, or unintentional weight loss. Per his wife he has had a chronic cough  He denies fever, chest pain, hemoptysis, or orthopnea. He exercises regularly and plays golf.  We discussed that surgery may be an option for him based on his pulmonary  function test. I have told him that the thoracic surgery will not consider him a surgical candidate unless he has quit smoking. I counseled him on smoking cessation for greater than 3 minutes, and asked him to start working on this right away. I have provided him with the be stronger than your excuses card, with the 1 800 quit now number for free nicotine patches gum or mints. He stated he was going to quit cold [place]. Recent echo done 12/01/2018 shows an essentially normal study. EF 55 to 60%, trivial mitral valve regurgitation   Test Results: 10/01/2019 LDCT Lung-RADS 4B, suspicious. Irregular solid 48.6 mm posterior right upper lobe lung mass with direct extension across the major fissure into the superior segment right lower lobe, highly suspicious for primary bronchogenic carcinoma. Additional imaging evaluation or consultation with Pulmonology or Thoracic Surgery recommended. One vessel coronary atherosclerosis. Ectatic 4.1 cm ascending thoracic aorta. Recommend annual imaging followup by CTA or MRA.   CBC Latest Ref Rng & Units 01/25/2019 11/24/2018 01/25/2018  WBC 3.4 - 10.8 x10E3/uL - 6.8 5.8  Hemoglobin 13.0 - 17.0 g/dL 13.9 15.2 14.3  Hematocrit 37.5 - 51.0 % - 43.5 40.3  Platelets 150 - 450 x10E3/uL - 221 241    BMP Latest Ref Rng & Units 01/25/2019 11/24/2018 10/03/2017  Glucose 70 - 99 mg/dL 79 79 94  BUN 8 -  23 mg/dL <5(L) 8 7(L)  Creatinine 0.61 - 1.24 mg/dL 0.65 0.69(L) 0.74(L)  BUN/Creat Ratio 10 - 24 - 12 9(L)  Sodium 135 - 145 mmol/L 129(L) 129(L) 134  Potassium 3.5 - 5.1 mmol/L 4.3 4.5 4.3  Chloride 98 - 111 mmol/L 99 90(L) 99  CO2 22 - 32 mmol/L 20(L) 19(L) 22  Calcium 8.9 - 10.3 mg/dL 9.0 9.6 9.4    BNP No results found for: BNP  ProBNP No results found for: PROBNP  PFT No results found for: FEV1PRE, FEV1POST, FVCPRE, FVCPOST, TLC, DLCOUNC, PREFEV1FVCRT, PSTFEV1FVCRT  CT CHEST LUNG CA SCREEN LOW DOSE W/O CM  Result Date: 10/02/2019 CLINICAL DATA:   69 year old asymptomatic male current smoker with 30 pack-year smoking history. EXAM: CT CHEST WITHOUT CONTRAST LOW-DOSE FOR LUNG CANCER SCREENING TECHNIQUE: Multidetector CT imaging of the chest was performed following the standard protocol without IV contrast. COMPARISON:  None. FINDINGS: Cardiovascular: Normal heart size. No significant pericardial effusion/thickening. Left anterior descending coronary atherosclerosis. Atherosclerotic thoracic aorta with ectatic 4.1 cm ascending thoracic aorta. Normal caliber pulmonary arteries. Mediastinum/Nodes: No discrete thyroid nodules. Unremarkable esophagus. No pathologically enlarged axillary, mediastinal or hilar lymph nodes, noting limited sensitivity for the detection of hilar adenopathy on this noncontrast study. Lungs/Pleura: No pneumothorax. No pleural effusion. Mild centrilobular and paraseptal emphysema with diffuse bronchial wall thickening. There is an irregular solid posterior right upper lobe lung mass measuring 48.6 mm in volume derived mean diameter (series 3/image 95) with direct extension across the major fissure into the superior segment right lower lobe. Additional scattered pulmonary nodules in both lungs, largest a non solid posterior right lower lobe nodule measuring 12.7 mm in volume derived mean diameter (series 3/image 134). Upper abdomen: No acute abnormality. Musculoskeletal: No aggressive appearing focal osseous lesions. Minimal thoracic spondylosis. IMPRESSION: 1. Lung-RADS 4B, suspicious. Irregular solid 48.6 mm posterior right upper lobe lung mass with direct extension across the major fissure into the superior segment right lower lobe, highly suspicious for primary bronchogenic carcinoma. Additional imaging evaluation or consultation with Pulmonology or Thoracic Surgery recommended. 2. One vessel coronary atherosclerosis. 3. Ectatic 4.1 cm ascending thoracic aorta. Recommend annual imaging followup by CTA or MRA. This recommendation follows  2010 ACCF/AHA/AATS/ACR/ASA/SCA/SCAI/SIR/STS/SVM Guidelines for the Diagnosis and Management of Patients with Thoracic Aortic Disease. Circulation. 2010; 121: J500-X381. Aortic aneurysm NOS (ICD10-I71.9) 4. Aortic Atherosclerosis (ICD10-I70.0) and Emphysema (ICD10-J43.9). These results will be called to the ordering clinician or representative by the Radiologist Assistant, and communication documented in the PACS or [employer]. Electronically Signed   By: Ilona Sorrel M.D.   On: 10/02/2019 08:42     Past medical hx Past Medical History:  Diagnosis Date  . A-fib [facility])    s/p DCCV 12/07/18  . Hypertension      Social History   Tobacco Use  . Smoking status: Heavy Tobacco Smoker    Packs/day: 1.00    Years: 35.00    Pack years: 35.00    Types: Cigarettes  . Smokeless tobacco: Never Used  Substance Use Topics  . Alcohol use: Yes    Alcohol/week: 24.0 standard drinks    Types: 24 Cans of beer per week  . Drug use: No    Mr.Azizi reports that he has been smoking cigarettes. He has a 35.00 pack-year smoking history. He has never used smokeless tobacco. He reports current alcohol use of about 24.0 standard drinks of alcohol per week. He reports that he does not use drugs.  Tobacco Cessation: Current every day smoker with a  35-pack-year smoking history  Past surgical hx, Family hx, Social hx all reviewed.  Current Outpatient Medications on File Prior to Visit  Medication Sig  . amlodipine-benazepril (LOTREL) 2.5-10 MG capsule Take 1 capsule by mouth daily.  Marland Kitchen ibuprofen (ADVIL) 200 MG tablet Take 400 mg by mouth every 8 (eight) hours as needed (for pain).  . metoprolol tartrate (LOPRESSOR) 50 MG tablet Take 50 mg by mouth 2 (two) times daily with a meal.  . traMADol (ULTRAM) 50 MG tablet Take 1 tablet (50 mg total) by mouth every 6 (six) hours as needed for moderate pain.  Marland Kitchen XARELTO 20 MG TABS tablet Take 20 mg by mouth daily at 3 pm. In the afternoon.   No current  facility-administered medications on file prior to visit.     No Known Allergies  Review Of Systems:  Constitutional:   No  weight loss, night sweats,  Fevers, chills, fatigue, or  lassitude.  HEENT:   No headaches,  Difficulty swallowing,  Tooth/dental problems, or  Sore throat,                No sneezing, itching, ear ache, nasal congestion, post nasal drip,   CV:  No chest pain,  Orthopnea, PND, swelling in lower extremities, anasarca, dizziness, palpitations, syncope.   GI  No heartburn, indigestion, abdominal pain, nausea, vomiting, diarrhea, change in bowel habits, loss of appetite, bloody stools.   Resp: + shortness of breath with exertion or at rest.  No excess mucus, no productive cough,  + non-productive cough,  No coughing up of blood.  No change in color of mucus.  No wheezing.  No chest wall deformity  Skin: no rash or lesions.  GU: no dysuria, change in color of urine, no urgency or frequency.  No flank pain, no hematuria   MS:  No joint pain or swelling.  No decreased range of motion.  No back pain.  Psych:  No change in mood or affect. No depression or anxiety.  No memory loss.   Vital Signs BP 140/82 (BP Location: Right Arm, Cuff Size: Normal)   Pulse (!) 55   Temp 98.1 F (36.7 C) (Temporal)   Ht 6\' 2"  (1.88 m)   Wt 169 lb 9.6 oz (76.9 kg)   SpO2 100%   BMI 21.78 kg/m    Physical Exam:  General- No distress,  A&Ox3, pleasant ENT: No sinus tenderness, TM clear, pale nasal mucosa, no oral exudate,no post nasal drip, no LAN Cardiac: S1, S2, regular rate and rhythm, no murmur Chest: No wheeze/ rales/ dullness; no accessory muscle use, no nasal flaring, no sternal retractions Abd.: Soft Non-tender, ND, BS +, Body mass index is 21.78 kg/m. 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 We need this asap You will need to be Covid Tested prior to getting the PFT's We will order a Super D CT Chest . You will receive a phone call to get all of these interventions scheduled. This is a CT scan that will help Dr. Lamonte Sakai navigate to your lung mass for biopsy.  We will work on scheduling your biopsy for  The week of 5/18 after your trip to [place]. Date we will target is 5/19 We will need to hold your  Xaralto for 24-48 hours prior to your procedure.  We will let you know closer to the time specifically how long to hold this medication. Please work on quitting smoking. Follow up within 1 week of biopsy to ensure doing well.  Please call the office with any questions. [phone] Please contact office for sooner follow up if symptoms do not improve or worsen or seek emergency care   Tobacco Abuse in setting of new lung mass Need to quit prior to any surgical treatment options Plan I have spent 5 minutes counseling patient on smoking cessation this visit. Patient verbalizes understanding of  Continuing to  smoking and the negative health consequences including worsening of COPD, risk of lung cancer , stroke and heart disease. I have explained he will need to quit in order  to have the option of surgical intervention if needed.    This appointment was 40 min long with over 50% of the time in direct face-to-face patient care, assessment, plan of care, and follow-up.    Magdalen Spatz, NP 10/10/2019  4:04 PM

## 2019-10-10 NOTE — Patient Instructions (Addendum)
It is good to see you today. We will order PFT's today. We need this asap You will need to be Covid Tested prior to getting the PFT's We will order a Super D CT Chest . You will receive a phone call to get all of these interventions scheduled. This is a CT scan that will help Dr. Lamonte Sakai navigate to your lung mass for biopsy.  We will work on scheduling your biopsy for  The week of 5/18 after your trip top [place]. We will target 5/19 We will need to hold you Xaralto for 24-48 hours prior to your procedure.  We will let you know closer to the time specifically how long to hold this medication. Please work on quitting smoking. Please call the office with any questions. [phone] You will need follow up appointment after procedure.  Please contact office for sooner follow up if symptoms do not improve or worsen or seek emergency care

## 2019-10-10 NOTE — H&P (View-Only) (Signed)
History of Present Illness Christopher Harding is a 69 y.o. male current every day smoker ( 35 pack year smoking history) with baseline LDCT  finding suspicious. for primary bronchogenic carcinoma. He is followed through the lung screening program. Dr. Lamonte Sakai has reviewed the patient scan.    10/10/2019 Follow up Abnormal baseline LDCT >> LR 4B Pt. Presents for follow up. He was notified by phone of the new finding of a mass in his right upper/ right lower lobe. He verbalizes that he is nervous. I reviewed the scan images with the patient. I explained that I have reviewed his scan results with Dr. Baltazar Apo.  I told him that the next step would be to get a tissue sample of the mass to determine exactly what it is.  I did explain that we were concerned that this was bronchogenic cancer.  I explained that the plan would be for ENB/EBUS for tissue sampling.  I also explained that we would need to obtain pulmonary function tests, and a super D CT prior to the procedure. The patient is on Xarelto, and he will need to have this medication held for 1 to 2 days prior to the procedure. He is planning on going to visit his daughter in [place] and would prefer to wait until he returns home to have the procedure done.  He is only going to be gone for about 4 days, so we could get him scheduled mid-May. I did explain that we would need to go ahead and get the pulmonary function tests and the super D CT as soon as possible.  He is in agreement with getting that done prior to going to [place] for his vacation.  The patient has had both Covid Vaccines, second dose was administered 08/22/2019  He is on [place] for atrial fibrillation  The patient denies coughing up blood, or unintentional weight loss. Per his wife he has had a chronic cough  He denies fever, chest pain, hemoptysis, or orthopnea. He exercises regularly and plays golf.  We discussed that surgery may be an option for him based on his pulmonary  function test. I have told him that the thoracic surgery will not consider him a surgical candidate unless he has quit smoking. I counseled him on smoking cessation for greater than 3 minutes, and asked him to start working on this right away. I have provided him with the be stronger than your excuses card, with the 1 800 quit now number for free nicotine patches gum or mints. He stated he was going to quit cold [place]. Recent echo done 12/01/2018 shows an essentially normal study. EF 55 to 60%, trivial mitral valve regurgitation   Test Results: 10/01/2019 LDCT Lung-RADS 4B, suspicious. Irregular solid 48.6 mm posterior right upper lobe lung mass with direct extension across the major fissure into the superior segment right lower lobe, highly suspicious for primary bronchogenic carcinoma. Additional imaging evaluation or consultation with Pulmonology or Thoracic Surgery recommended. One vessel coronary atherosclerosis. Ectatic 4.1 cm ascending thoracic aorta. Recommend annual imaging followup by CTA or MRA.   CBC Latest Ref Rng & Units 01/25/2019 11/24/2018 01/25/2018  WBC 3.4 - 10.8 x10E3/uL - 6.8 5.8  Hemoglobin 13.0 - 17.0 g/dL 13.9 15.2 14.3  Hematocrit 37.5 - 51.0 % - 43.5 40.3  Platelets 150 - 450 x10E3/uL - 221 241    BMP Latest Ref Rng & Units 01/25/2019 11/24/2018 10/03/2017  Glucose 70 - 99 mg/dL 79 79 94  BUN 8 -  23 mg/dL <5(L) 8 7(L)  Creatinine 0.61 - 1.24 mg/dL 0.65 0.69(L) 0.74(L)  BUN/Creat Ratio 10 - 24 - 12 9(L)  Sodium 135 - 145 mmol/L 129(L) 129(L) 134  Potassium 3.5 - 5.1 mmol/L 4.3 4.5 4.3  Chloride 98 - 111 mmol/L 99 90(L) 99  CO2 22 - 32 mmol/L 20(L) 19(L) 22  Calcium 8.9 - 10.3 mg/dL 9.0 9.6 9.4    BNP No results found for: BNP  ProBNP No results found for: PROBNP  PFT No results found for: FEV1PRE, FEV1POST, FVCPRE, FVCPOST, TLC, DLCOUNC, PREFEV1FVCRT, PSTFEV1FVCRT  CT CHEST LUNG CA SCREEN LOW DOSE W/O CM  Result Date: 10/02/2019 CLINICAL DATA:   69 year old asymptomatic male current smoker with 30 pack-year smoking history. EXAM: CT CHEST WITHOUT CONTRAST LOW-DOSE FOR LUNG CANCER SCREENING TECHNIQUE: Multidetector CT imaging of the chest was performed following the standard protocol without IV contrast. COMPARISON:  None. FINDINGS: Cardiovascular: Normal heart size. No significant pericardial effusion/thickening. Left anterior descending coronary atherosclerosis. Atherosclerotic thoracic aorta with ectatic 4.1 cm ascending thoracic aorta. Normal caliber pulmonary arteries. Mediastinum/Nodes: No discrete thyroid nodules. Unremarkable esophagus. No pathologically enlarged axillary, mediastinal or hilar lymph nodes, noting limited sensitivity for the detection of hilar adenopathy on this noncontrast study. Lungs/Pleura: No pneumothorax. No pleural effusion. Mild centrilobular and paraseptal emphysema with diffuse bronchial wall thickening. There is an irregular solid posterior right upper lobe lung mass measuring 48.6 mm in volume derived mean diameter (series 3/image 95) with direct extension across the major fissure into the superior segment right lower lobe. Additional scattered pulmonary nodules in both lungs, largest a non solid posterior right lower lobe nodule measuring 12.7 mm in volume derived mean diameter (series 3/image 134). Upper abdomen: No acute abnormality. Musculoskeletal: No aggressive appearing focal osseous lesions. Minimal thoracic spondylosis. IMPRESSION: 1. Lung-RADS 4B, suspicious. Irregular solid 48.6 mm posterior right upper lobe lung mass with direct extension across the major fissure into the superior segment right lower lobe, highly suspicious for primary bronchogenic carcinoma. Additional imaging evaluation or consultation with Pulmonology or Thoracic Surgery recommended. 2. One vessel coronary atherosclerosis. 3. Ectatic 4.1 cm ascending thoracic aorta. Recommend annual imaging followup by CTA or MRA. This recommendation follows  2010 ACCF/AHA/AATS/ACR/ASA/SCA/SCAI/SIR/STS/SVM Guidelines for the Diagnosis and Management of Patients with Thoracic Aortic Disease. Circulation. 2010; 121: H829-H371. Aortic aneurysm NOS (ICD10-I71.9) 4. Aortic Atherosclerosis (ICD10-I70.0) and Emphysema (ICD10-J43.9). These results will be called to the ordering clinician or representative by the Radiologist Assistant, and communication documented in the PACS or [employer]. Electronically Signed   By: Ilona Sorrel M.D.   On: 10/02/2019 08:42     Past medical hx Past Medical History:  Diagnosis Date  . A-fib [facility])    s/p DCCV 12/07/18  . Hypertension      Social History   Tobacco Use  . Smoking status: Heavy Tobacco Smoker    Packs/day: 1.00    Years: 35.00    Pack years: 35.00    Types: Cigarettes  . Smokeless tobacco: Never Used  Substance Use Topics  . Alcohol use: Yes    Alcohol/week: 24.0 standard drinks    Types: 24 Cans of beer per week  . Drug use: No    Mr.Judson reports that he has been smoking cigarettes. He has a 35.00 pack-year smoking history. He has never used smokeless tobacco. He reports current alcohol use of about 24.0 standard drinks of alcohol per week. He reports that he does not use drugs.  Tobacco Cessation: Current every day smoker with a  35-pack-year smoking history  Past surgical hx, Family hx, Social hx all reviewed.  Current Outpatient Medications on File Prior to Visit  Medication Sig  . amlodipine-benazepril (LOTREL) 2.5-10 MG capsule Take 1 capsule by mouth daily.  Marland Kitchen ibuprofen (ADVIL) 200 MG tablet Take 400 mg by mouth every 8 (eight) hours as needed (for pain).  . metoprolol tartrate (LOPRESSOR) 50 MG tablet Take 50 mg by mouth 2 (two) times daily with a meal.  . traMADol (ULTRAM) 50 MG tablet Take 1 tablet (50 mg total) by mouth every 6 (six) hours as needed for moderate pain.  Marland Kitchen XARELTO 20 MG TABS tablet Take 20 mg by mouth daily at 3 pm. In the afternoon.   No current  facility-administered medications on file prior to visit.     No Known Allergies  Review Of Systems:  Constitutional:   No  weight loss, night sweats,  Fevers, chills, fatigue, or  lassitude.  HEENT:   No headaches,  Difficulty swallowing,  Tooth/dental problems, or  Sore throat,                No sneezing, itching, ear ache, nasal congestion, post nasal drip,   CV:  No chest pain,  Orthopnea, PND, swelling in lower extremities, anasarca, dizziness, palpitations, syncope.   GI  No heartburn, indigestion, abdominal pain, nausea, vomiting, diarrhea, change in bowel habits, loss of appetite, bloody stools.   Resp: + shortness of breath with exertion or at rest.  No excess mucus, no productive cough,  + non-productive cough,  No coughing up of blood.  No change in color of mucus.  No wheezing.  No chest wall deformity  Skin: no rash or lesions.  GU: no dysuria, change in color of urine, no urgency or frequency.  No flank pain, no hematuria   MS:  No joint pain or swelling.  No decreased range of motion.  No back pain.  Psych:  No change in mood or affect. No depression or anxiety.  No memory loss.   Vital Signs BP 140/82 (BP Location: Right Arm, Cuff Size: Normal)   Pulse (!) 55   Temp 98.1 F (36.7 C) (Temporal)   Ht 6\' 2"  (1.88 m)   Wt 169 lb 9.6 oz (76.9 kg)   SpO2 100%   BMI 21.78 kg/m    Physical Exam:  General- No distress,  A&Ox3, pleasant ENT: No sinus tenderness, TM clear, pale nasal mucosa, no oral exudate,no post nasal drip, no LAN Cardiac: S1, S2, regular rate and rhythm, no murmur Chest: No wheeze/ rales/ dullness; no accessory muscle use, no nasal flaring, no sternal retractions Abd.: Soft Non-tender, ND, BS +, Body mass index is 21.78 kg/m. 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 We need this asap You will need to be Covid Tested prior to getting the PFT's We will order a Super D CT Chest . You will receive a phone call to get all of these interventions scheduled. This is a CT scan that will help Dr. Lamonte Sakai navigate to your lung mass for biopsy.  We will work on scheduling your biopsy for  The week of 5/18 after your trip to [place]. Date we will target is 5/19 We will need to hold your  Xaralto for 24-48 hours prior to your procedure.  We will let you know closer to the time specifically how long to hold this medication. Please work on quitting smoking. Follow up within 1 week of biopsy to ensure doing well.  Please call the office with any questions. [phone] Please contact office for sooner follow up if symptoms do not improve or worsen or seek emergency care   Tobacco Abuse in setting of new lung mass Need to quit prior to any surgical treatment options Plan I have spent 5 minutes counseling patient on smoking cessation this visit. Patient verbalizes understanding of  Continuing to  smoking and the negative health consequences including worsening of COPD, risk of lung cancer , stroke and heart disease. I have explained he will need to quit in order  to have the option of surgical intervention if needed.    This appointment was 40 min long with over 50% of the time in direct face-to-face patient care, assessment, plan of care, and follow-up.    Magdalen Spatz, NP 10/10/2019  4:04 PM

## 2019-10-11 ENCOUNTER — Encounter: Payer: Self-pay | Admitting: Acute Care

## 2019-10-15 ENCOUNTER — Other Ambulatory Visit (HOSPITAL_COMMUNITY)
Admission: RE | Admit: 2019-10-15 | Discharge: 2019-10-15 | Disposition: A | Discharge status: Home / Self Care | Payer: Medicare Other | Source: Ambulatory Visit | Attending: Physician Assistant | Admitting: Physician Assistant

## 2019-10-15 DIAGNOSIS — Z20822 Contact with and (suspected) exposure to covid-19: Secondary | ICD-10-CM | POA: Insufficient documentation

## 2019-10-15 DIAGNOSIS — Z01812 Encounter for preprocedural laboratory examination: Secondary | ICD-10-CM | POA: Insufficient documentation

## 2019-10-15 LAB — SARS CORONAVIRUS 2 (TAT 6-24 HRS): SARS Coronavirus 2: NEGATIVE

## 2019-10-18 ENCOUNTER — Ambulatory Visit (INDEPENDENT_AMBULATORY_CARE_PROVIDER_SITE_OTHER): Payer: Medicare Other | Admitting: Internal Medicine

## 2019-10-18 ENCOUNTER — Other Ambulatory Visit: Payer: Self-pay

## 2019-10-18 DIAGNOSIS — R911 Solitary pulmonary nodule: Secondary | ICD-10-CM

## 2019-10-18 LAB — PULMONARY FUNCTION TEST
DL/VA % pred: 73 %
DL/VA: 2.92 ml/min/mmHg/L
DLCO unc % pred: 72 %
DLCO unc: 21.25 ml/min/mmHg
FEF 25-75 Post: 1.2 L/sec
FEF 25-75 Pre: 2.11 L/sec
FEF2575-%Change-Post: -43 %
FEF2575-%Pred-Post: 41 %
FEF2575-%Pred-Pre: 72 %
FEV1-%Change-Post: -19 %
FEV1-%Pred-Post: 74 %
FEV1-%Pred-Pre: 93 %
FEV1-Post: 2.86 L
FEV1-Pre: 3.56 L
FEV1FVC-%Change-Post: -16 %
FEV1FVC-%Pred-Pre: 91 %
FEV6-%Change-Post: -5 %
FEV6-%Pred-Post: 101 %
FEV6-%Pred-Pre: 107 %
FEV6-Post: 4.95 L
FEV6-Pre: 5.25 L
FEV6FVC-%Change-Post: -1 %
FEV6FVC-%Pred-Post: 103 %
FEV6FVC-%Pred-Pre: 105 %
FVC-%Change-Post: -3 %
FVC-%Pred-Post: 97 %
FVC-%Pred-Pre: 101 %
FVC-Post: 5.05 L
FVC-Pre: 5.26 L
Post FEV1/FVC ratio: 57 %
Post FEV6/FVC ratio: 98 %
Pre FEV1/FVC ratio: 68 %
Pre FEV6/FVC Ratio: 100 %

## 2019-10-18 NOTE — Progress Notes (Signed)
PFT completed today.  

## 2019-10-19 ENCOUNTER — Inpatient Hospital Stay: Admission: RE | Admit: 2019-10-19 | Payer: Medicare Other | Source: Ambulatory Visit

## 2019-10-25 ENCOUNTER — Other Ambulatory Visit: Payer: Medicare Other

## 2019-10-27 ENCOUNTER — Inpatient Hospital Stay (HOSPITAL_COMMUNITY): Admission: RE | Admit: 2019-10-27 | Payer: Medicare Other | Source: Ambulatory Visit

## 2019-10-29 ENCOUNTER — Encounter (HOSPITAL_COMMUNITY): Payer: Self-pay | Admitting: Internal Medicine

## 2019-10-29 ENCOUNTER — Other Ambulatory Visit: Payer: Self-pay

## 2019-10-29 ENCOUNTER — Telehealth: Payer: Self-pay | Admitting: Internal Medicine

## 2019-10-29 ENCOUNTER — Other Ambulatory Visit (HOSPITAL_COMMUNITY)
Admission: RE | Admit: 2019-10-29 | Discharge: 2019-10-29 | Disposition: A | Discharge status: Home / Self Care | Payer: Medicare Other | Source: Ambulatory Visit | Attending: Internal Medicine | Admitting: Internal Medicine

## 2019-10-29 DIAGNOSIS — Z20822 Contact with and (suspected) exposure to covid-19: Secondary | ICD-10-CM | POA: Diagnosis not present

## 2019-10-29 DIAGNOSIS — Z01812 Encounter for preprocedural laboratory examination: Secondary | ICD-10-CM | POA: Insufficient documentation

## 2019-10-29 LAB — SARS CORONAVIRUS 2 (TAT 6-24 HRS): SARS Coronavirus 2: NEGATIVE

## 2019-10-29 NOTE — Telephone Encounter (Signed)
Spoke with Jan with [facility], Needs consent form and any labs that DS wants to order for bronch scheduled for tomorrow.   A text message has been sent to Dr. Tamala Julian to make him aware as he is not in clinic today.   Routing to Dr. Tamala Julian to follow up on.

## 2019-10-29 NOTE — Progress Notes (Signed)
Mr. Christopher Harding denies chest pain or shortness of breath. Mr. Christopher Harding had a Covid test today and he is in quarantine with his wife.  Mr Christopher Harding traveled to [place] and has been back 1 week.Patient reports that he was visiting his daughter and her family and they did not really go out, he did fly.  Patient is not aware of anyone he was around anyone demonstrating s/s of Covid.  Mr. Christopher Harding and his wife do not have any symptoms of Covid.

## 2019-10-30 ENCOUNTER — Other Ambulatory Visit: Payer: Self-pay | Admitting: Anatomic Pathology & Clinical Pathology

## 2019-10-30 ENCOUNTER — Ambulatory Visit (HOSPITAL_COMMUNITY): Payer: Medicare Other | Admitting: Certified Registered"

## 2019-10-30 ENCOUNTER — Encounter (HOSPITAL_COMMUNITY): Payer: Self-pay | Admitting: Internal Medicine

## 2019-10-30 ENCOUNTER — Ambulatory Visit (HOSPITAL_COMMUNITY)
Admission: RE | Admit: 2019-10-30 | Discharge: 2019-10-30 | Disposition: A | Discharge status: Home / Self Care | Payer: Medicare Other | Attending: Internal Medicine | Admitting: Internal Medicine

## 2019-10-30 ENCOUNTER — Encounter (HOSPITAL_COMMUNITY)
Admission: RE | Disposition: A | Discharge status: Home / Self Care | Payer: Self-pay | Source: Home / Self Care | Attending: Internal Medicine

## 2019-10-30 ENCOUNTER — Ambulatory Visit (HOSPITAL_COMMUNITY): Payer: Medicare Other

## 2019-10-30 DIAGNOSIS — I10 Essential (primary) hypertension: Secondary | ICD-10-CM | POA: Diagnosis not present

## 2019-10-30 DIAGNOSIS — Z791 Long term (current) use of non-steroidal anti-inflammatories (NSAID): Secondary | ICD-10-CM | POA: Insufficient documentation

## 2019-10-30 DIAGNOSIS — I700 Atherosclerosis of aorta: Secondary | ICD-10-CM | POA: Diagnosis not present

## 2019-10-30 DIAGNOSIS — R918 Other nonspecific abnormal finding of lung field: Secondary | ICD-10-CM | POA: Diagnosis not present

## 2019-10-30 DIAGNOSIS — I4891 Unspecified atrial fibrillation: Secondary | ICD-10-CM | POA: Diagnosis not present

## 2019-10-30 DIAGNOSIS — J439 Emphysema, unspecified: Secondary | ICD-10-CM | POA: Insufficient documentation

## 2019-10-30 DIAGNOSIS — F17210 Nicotine dependence, cigarettes, uncomplicated: Secondary | ICD-10-CM | POA: Insufficient documentation

## 2019-10-30 DIAGNOSIS — I4819 Other persistent atrial fibrillation: Secondary | ICD-10-CM | POA: Diagnosis not present

## 2019-10-30 DIAGNOSIS — Z7901 Long term (current) use of anticoagulants: Secondary | ICD-10-CM | POA: Insufficient documentation

## 2019-10-30 DIAGNOSIS — C3411 Malignant neoplasm of upper lobe, right bronchus or lung: Secondary | ICD-10-CM | POA: Insufficient documentation

## 2019-10-30 DIAGNOSIS — Z79899 Other long term (current) drug therapy: Secondary | ICD-10-CM | POA: Diagnosis not present

## 2019-10-30 DIAGNOSIS — R911 Solitary pulmonary nodule: Secondary | ICD-10-CM | POA: Diagnosis not present

## 2019-10-30 DIAGNOSIS — C3491 Malignant neoplasm of unspecified part of right bronchus or lung: Secondary | ICD-10-CM | POA: Diagnosis not present

## 2019-10-30 HISTORY — PX: FINE NEEDLE ASPIRATION: SHX5430

## 2019-10-30 HISTORY — DX: Cardiac arrhythmia, unspecified: I49.9

## 2019-10-30 HISTORY — DX: Depression, unspecified: F32.A

## 2019-10-30 HISTORY — PX: VIDEO BRONCHOSCOPY WITH ENDOBRONCHIAL ULTRASOUND: SHX6177

## 2019-10-30 LAB — TYPE AND SCREEN
ABO/RH(D): A NEG
Antibody Screen: NEGATIVE

## 2019-10-30 LAB — COMPREHENSIVE METABOLIC PANEL
ALT: 15 U/L (ref 0–44)
AST: 31 U/L (ref 15–41)
Albumin: 3.5 g/dL (ref 3.5–5.0)
Alkaline Phosphatase: 39 U/L (ref 38–126)
Anion gap: 8 (ref 5–15)
BUN: 5 mg/dL — ABNORMAL LOW (ref 8–23)
CO2: 24 mmol/L (ref 22–32)
Calcium: 9.1 mg/dL (ref 8.9–10.3)
Chloride: 96 mmol/L — ABNORMAL LOW (ref 98–111)
Creatinine, Ser: 0.67 mg/dL (ref 0.61–1.24)
GFR calc Af Amer: >60 mL/min (ref >60)
GFR calc non Af Amer: >60 mL/min (ref >60)
Glucose, Bld: 79 mg/dL (ref 70–99)
Potassium: 4.4 mmol/L (ref 3.5–5.1)
Sodium: 128 mmol/L — ABNORMAL LOW (ref 135–145)
Total Bilirubin: 0.5 mg/dL (ref 0.3–1.2)
Total Protein: 7 g/dL (ref 6.5–8.1)

## 2019-10-30 LAB — CBC
HCT: 37.6 % — ABNORMAL LOW (ref 39.0–52.0)
Hemoglobin: 13.2 g/dL (ref 13.0–17.0)
MCH: 34 pg (ref 26.0–34.0)
MCHC: 35.1 g/dL (ref 30.0–36.0)
MCV: 96.9 fL (ref 80.0–100.0)
Platelets: 237 10*3/uL (ref 150–400)
RBC: 3.88 MIL/uL — ABNORMAL LOW (ref 4.22–5.81)
RDW: 12.9 % (ref 11.5–15.5)
WBC: 5.6 10*3/uL (ref 4.0–10.5)
nRBC: 0 % (ref 0.0–0.2)

## 2019-10-30 LAB — APTT: aPTT: 31 seconds (ref 24–36)

## 2019-10-30 LAB — ABO/RH: ABO/RH(D): A NEG

## 2019-10-30 LAB — PROTIME-INR
INR: 1.1 (ref 0.8–1.2)
Prothrombin Time: 13.3 seconds (ref 11.4–15.2)

## 2019-10-30 IMAGING — DX DG CHEST 1V PORT
1 series · 2 of 2 positions shown · non-contrast
Comparison: Chest CT, [DATE]

CLINICAL DATA: Status post right lung mass biopsy.

EXAM:
PORTABLE CHEST 1 VIEW

[Series 1: chest · 0.14mm/px · 2 of 2 slices shown]
[im 1/2]
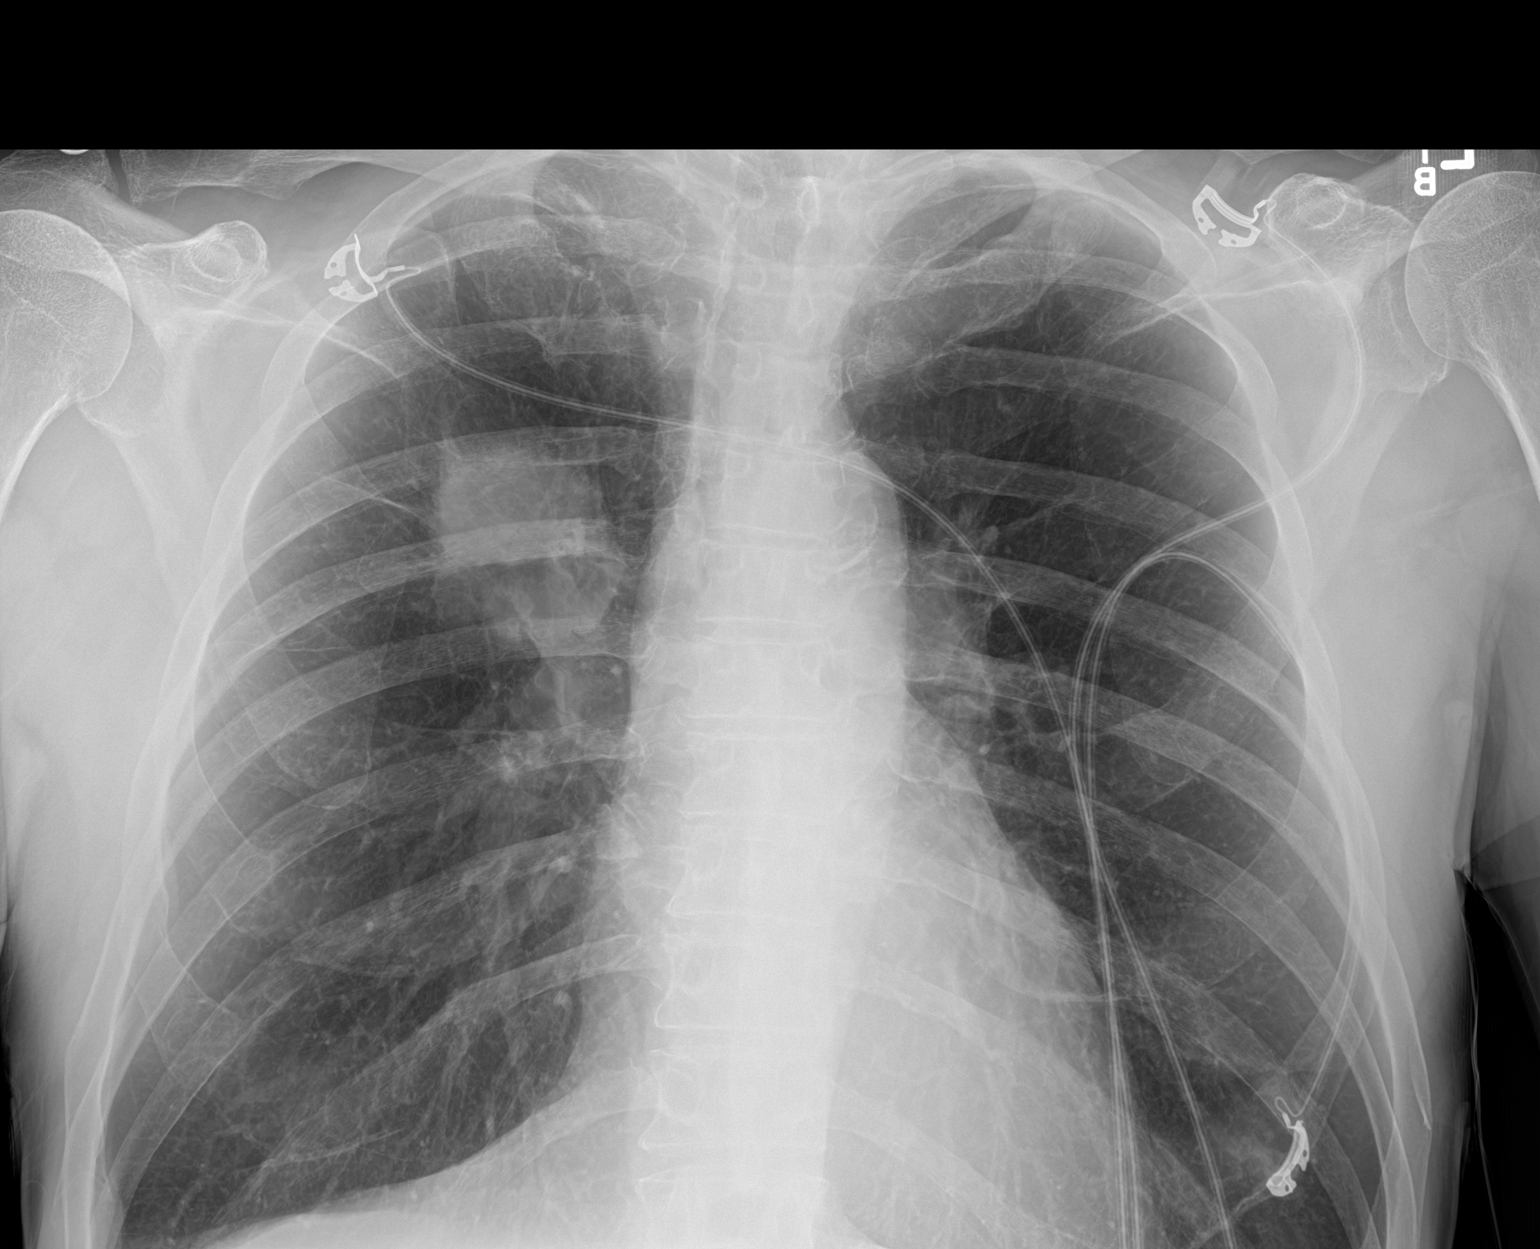
[im 2/2]
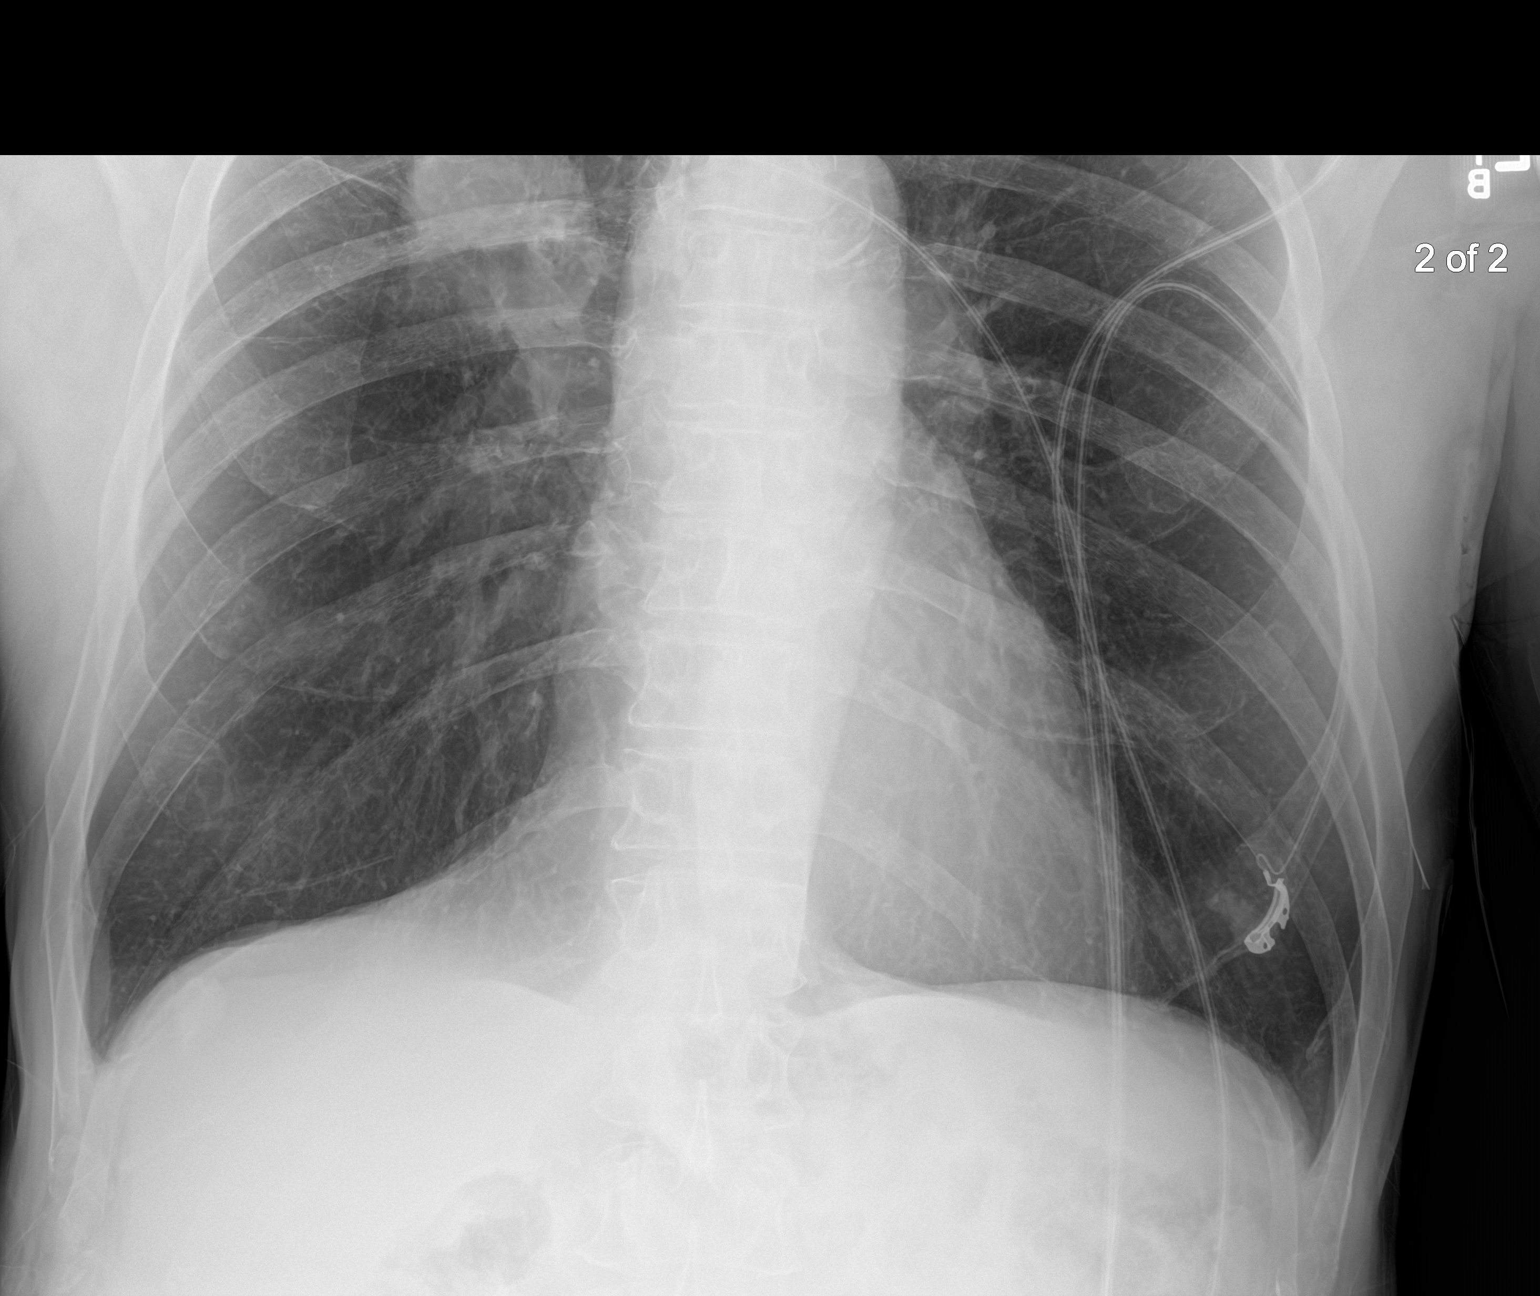

[2 of 2 positions shown; findings below may reference images not displayed]

FINDINGS: Mass projecting superiorly and laterally from the right hilum,
measuring approximately 5.8 cm, is without change from the previous
chest CT.

Lungs are hyperexpanded but otherwise clear. No pleural effusion. No
pneumothorax.

Cardiac silhouette is normal in size. No mediastinal or left hilar
masses.

Skeletal structures are grossly intact
IMPRESSION: 1. No acute cardiopulmonary disease. No evidence of a complication
following right lung mass biopsy.
2. Right upper lung mass extending from the superior right hilum is
stable compared to the prior chest CT.

## 2019-10-30 SURGERY — BRONCHOSCOPY, WITH EBUS
Anesthesia: General

## 2019-10-30 MED ORDER — FENTANYL CITRATE (PF) 100 MCG/2ML IJ SOLN: 25.0000 ug | INTRAMUSCULAR | Status: DC | PRN

## 2019-10-30 MED ORDER — ROCURONIUM BROMIDE 10 MG/ML (PF) SYRINGE
PREFILLED_SYRINGE | INTRAVENOUS | Status: DC | PRN
  Administered 2019-10-30: 60 mg via INTRAVENOUS

## 2019-10-30 MED ORDER — PROPOFOL 10 MG/ML IV BOLUS
INTRAVENOUS | Status: DC | PRN
  Administered 2019-10-30: 180 mg via INTRAVENOUS

## 2019-10-30 MED ORDER — ONDANSETRON HCL 4 MG/2ML IJ SOLN: 4.0000 mg | Freq: Once | INTRAMUSCULAR | Status: DC | PRN

## 2019-10-30 MED ORDER — DEXAMETHASONE SODIUM PHOSPHATE 10 MG/ML IJ SOLN
INTRAMUSCULAR | Status: DC | PRN
  Administered 2019-10-30: 4 mg via INTRAVENOUS

## 2019-10-30 MED ORDER — OXYCODONE HCL 5 MG PO TABS
5.0000 mg | ORAL_TABLET | Freq: Once | ORAL | Status: DC | PRN
Start: 2019-10-30 — End: 2019-10-30

## 2019-10-30 MED ORDER — SUGAMMADEX SODIUM 200 MG/2ML IV SOLN
INTRAVENOUS | Status: DC | PRN
  Administered 2019-10-30: 200 mg via INTRAVENOUS

## 2019-10-30 MED ORDER — CHLORHEXIDINE GLUCONATE 0.12 % MT SOLN
15.0000 mL | Freq: Once | OROMUCOSAL | Status: AC
  Administered 2019-10-30: 15 mL via OROMUCOSAL
  Filled 2019-10-30 (×2): qty 15

## 2019-10-30 MED ORDER — OXYCODONE HCL 5 MG/5ML PO SOLN: 5.0000 mg | Freq: Once | ORAL | Status: DC | PRN

## 2019-10-30 MED ORDER — EPHEDRINE SULFATE-NACL 50-0.9 MG/10ML-% IV SOSY
PREFILLED_SYRINGE | INTRAVENOUS | Status: DC | PRN
  Administered 2019-10-30: 5 mg via INTRAVENOUS
  Administered 2019-10-30 (×3): 10 mg via INTRAVENOUS

## 2019-10-30 MED ORDER — MIDAZOLAM HCL 5 MG/5ML IJ SOLN
INTRAMUSCULAR | Status: DC | PRN
  Administered 2019-10-30 (×2): 1 mg via INTRAVENOUS

## 2019-10-30 MED ORDER — METOPROLOL TARTRATE 50 MG PO TABS
50.0000 mg | ORAL_TABLET | Freq: Once | ORAL | Status: AC
  Administered 2019-10-30: 50 mg via ORAL
  Filled 2019-10-30 (×2): qty 1

## 2019-10-30 MED ORDER — LACTATED RINGERS IV SOLN: INTRAVENOUS | Status: DC

## 2019-10-30 MED ORDER — ORAL CARE MOUTH RINSE: 15.0000 mL | Freq: Once | OROMUCOSAL | Status: AC

## 2019-10-30 MED ORDER — PHENYLEPHRINE 40 MCG/ML (10ML) SYRINGE FOR IV PUSH (FOR BLOOD PRESSURE SUPPORT)
PREFILLED_SYRINGE | INTRAVENOUS | Status: DC | PRN
  Administered 2019-10-30 (×2): 80 ug via INTRAVENOUS

## 2019-10-30 MED ORDER — LIDOCAINE 2% (20 MG/ML) 5 ML SYRINGE
INTRAMUSCULAR | Status: DC | PRN
  Administered 2019-10-30: 40 mg via INTRAVENOUS

## 2019-10-30 MED ORDER — FENTANYL CITRATE (PF) 100 MCG/2ML IJ SOLN
INTRAMUSCULAR | Status: DC | PRN
  Administered 2019-10-30: 100 ug via INTRAVENOUS

## 2019-10-30 SURGICAL SUPPLY — 35 items
ADAPTER VALVE BIOPSY EBUS (MISCELLANEOUS) IMPLANT
ADPTR VALVE BIOPSY EBUS (MISCELLANEOUS)
BRUSH CYTOL CELLEBRITY 1.5X140 (MISCELLANEOUS) IMPLANT
CANISTER SUCT 3000ML PPV (MISCELLANEOUS) ×3 IMPLANT
CONT SPEC 4OZ CLIKSEAL STRL BL (MISCELLANEOUS) ×3 IMPLANT
COVER BACK TABLE 60X90IN (DRAPES) ×3 IMPLANT
FORCEPS BIOP RJ4 1.8 (CUTTING FORCEPS) IMPLANT
GAUZE SPONGE 4X4 12PLY STRL (GAUZE/BANDAGES/DRESSINGS) ×3 IMPLANT
GLOVE BIO SURGEON STRL SZ7.5 (GLOVE) ×3 IMPLANT
GOWN STRL REUS W/ TWL LRG LVL3 (GOWN DISPOSABLE) ×2 IMPLANT
GOWN STRL REUS W/ TWL XL LVL3 (GOWN DISPOSABLE) ×2 IMPLANT
GOWN STRL REUS W/TWL LRG LVL3 (GOWN DISPOSABLE) ×3
GOWN STRL REUS W/TWL XL LVL3 (GOWN DISPOSABLE) ×3
KIT CLEAN ENDO COMPLIANCE (KITS) ×6 IMPLANT
KIT TURNOVER KIT B (KITS) ×3 IMPLANT
MARKER SKIN DUAL TIP RULER LAB (MISCELLANEOUS) ×3 IMPLANT
NDL ASPIRATION VIZISHOT 19G (NEEDLE) IMPLANT
NDL ASPIRATION VIZISHOT 21G (NEEDLE) IMPLANT
NEEDLE ASPIRATION VIZISHOT 19G (NEEDLE) IMPLANT
NEEDLE ASPIRATION VIZISHOT 21G (NEEDLE) IMPLANT
NS IRRIG 1000ML POUR BTL (IV SOLUTION) ×3 IMPLANT
OIL SILICONE PENTAX (PARTS (SERVICE/REPAIRS)) ×3 IMPLANT
PAD ARMBOARD 7.5X6 YLW CONV (MISCELLANEOUS) ×6 IMPLANT
SYR 20ML ECCENTRIC (SYRINGE) ×6 IMPLANT
SYR 20ML LL LF (SYRINGE) ×6 IMPLANT
SYR 50ML SLIP (SYRINGE) IMPLANT
SYR 5ML LUER SLIP (SYRINGE) ×3 IMPLANT
TOWEL GREEN STERILE FF (TOWEL DISPOSABLE) ×3 IMPLANT
TRAP SPECIMEN MUCOUS 40CC (MISCELLANEOUS) IMPLANT
TUBE CONNECTING 20X1/4 (TUBING) ×6 IMPLANT
UNDERPAD 30X30 (UNDERPADS AND DIAPERS) ×3 IMPLANT
VALVE BIOPSY  SINGLE USE (MISCELLANEOUS) ×3
VALVE BIOPSY SINGLE USE (MISCELLANEOUS) ×2 IMPLANT
VALVE SUCTION BRONCHIO DISP (MISCELLANEOUS) ×3 IMPLANT
WATER STERILE IRR 1000ML POUR (IV SOLUTION) ×3 IMPLANT

## 2019-10-30 NOTE — Op Note (Signed)
Bronchoscopy  Indication: Lung mass  Consent: Signed in chart  Anesthesia: General  Procedure - Timeout performed - Bronchoscope advanced through ETT - Airways examined down to subsegmental level - Following airway examination, EBUS-guided biopsies performed of RUL mass  Findings - No significant regional adenopathy in 7/4R/10R/11R on [place] - Severe bronchial pitting, mucoid secretions throughout - No endobronchial lesions - RUL mass ~3cm by [place]  Specimen(s): Cell block and slides of RUL mass  Complications: None immediate, no active bleeding seen after biopsies  CXR ordered then patient can go home with OP f/u  Erskine Emery MD PCCM

## 2019-10-30 NOTE — Anesthesia Procedure Notes (Signed)
Procedure Name: Intubation Date/Time: 10/30/2019 7:55 AM Performed by: Colin Benton, CRNA Pre-anesthesia Checklist: Patient identified, Emergency Drugs available, Suction available and Patient being monitored Patient Re-evaluated:Patient Re-evaluated prior to induction Oxygen Delivery Method: Circle system utilized Preoxygenation: Pre-oxygenation with 100% oxygen Induction Type: IV induction Ventilation: Mask ventilation without difficulty Laryngoscope Size: Miller and 2 Grade View: Grade II Tube type: Oral Tube size: 8.5 mm Number of attempts: 1 Airway Equipment and Method: Stylet Placement Confirmation: ETT inserted through vocal cords under direct vision,  positive ETCO2 and breath sounds checked- equal and bilateral Secured at: 24 cm Tube secured with: Tape Dental Injury: Teeth and Oropharynx as per pre-operative assessment

## 2019-10-30 NOTE — Interval H&P Note (Signed)
History and Physical Interval Note:  10/30/2019 7:01 AM  Christopher Harding  has presented today for surgery, with the diagnosis of RIGHT UPPER LODE.  The various methods of treatment have been discussed with the patient and family. After consideration of risks, benefits and other options for treatment, the patient has consented to  Procedure(s): [place] (N/A) as a surgical intervention.  The patient's history has been reviewed, patient examined, no change in status, stable for surgery.  I have reviewed the patient's chart and labs.  Questions were answered to the patient's satisfaction.     Candee Furbish

## 2019-10-30 NOTE — Anesthesia Postprocedure Evaluation (Signed)
Anesthesia Post Note  Patient: Christopher Harding  Procedure(s) Performed: VIDEO BRONCHOSCOPY WITH ENDOBRONCHIAL ULTRASOUND (N/A ) FINE NEEDLE ASPIRATION (FNA) LINEAR     Patient location during evaluation: PACU Anesthesia Type: General Level of consciousness: awake and alert Pain management: pain level controlled Vital Signs Assessment: post-procedure vital signs reviewed and stable Respiratory status: spontaneous breathing, nonlabored ventilation, respiratory function stable and patient connected to nasal cannula oxygen Cardiovascular status: blood pressure returned to baseline and stable Postop Assessment: no apparent nausea or vomiting Anesthetic complications: no    Last Vitals:  Vitals:   10/30/19 0915 10/30/19 0930  BP: 112/74 128/73  Pulse: (!) 56 (!) 57  Resp: 15 15  Temp:    SpO2: 100% 100%    Last Pain:  Vitals:   10/30/19 0915  TempSrc:   PainSc: 0-No pain                 Troi Bechtold COKER

## 2019-10-30 NOTE — Anesthesia Preprocedure Evaluation (Addendum)
Anesthesia Evaluation  Patient identified by MRN, date of birth, ID band Patient awake    Reviewed: Allergy & Precautions, NPO status , Patient's Chart, lab work & pertinent test results  Airway Mallampati: II  TM Distance: >3 FB     Dental  (+) Poor Dentition, Dental Advisory Given, Chipped, Missing,    Pulmonary Current Smoker and Patient abstained from smoking.,    breath sounds clear to auscultation       Cardiovascular hypertension, + dysrhythmias Atrial Fibrillation  Rhythm:Regular Rate:Normal     Neuro/Psych    GI/Hepatic   Endo/Other    Renal/GU      Musculoskeletal   Abdominal   Peds  Hematology   Anesthesia Other Findings   Reproductive/Obstetrics                            Anesthesia Physical Anesthesia Plan  ASA: III  Anesthesia Plan: General   Post-op Pain Management:    Induction: Intravenous  PONV Risk Score and Plan: Ondansetron and Dexamethasone  Airway Management Planned: Oral ETT  Additional Equipment:   Intra-op Plan:   Post-operative Plan: Extubation in OR  Informed Consent: I have reviewed the patients History and Physical, chart, labs and discussed the procedure including the risks, benefits and alternatives for the proposed anesthesia with the patient or authorized representative who has indicated his/her understanding and acceptance.     Dental advisory given  Plan Discussed with:   Anesthesia Plan Comments:        Anesthesia Quick Evaluation

## 2019-10-30 NOTE — Telephone Encounter (Signed)
Pt did have the procedure today by Dr. Tamala Julian. This encounter can now be closed.

## 2019-10-30 NOTE — Discharge Instructions (Signed)
Flexible Bronchoscopy, Care After This sheet gives you information about how to care for yourself after your test. Your doctor may also give you more specific instructions. If you have problems or questions, contact your doctor. Follow these instructions at home: Eating and drinking  Regular diet Driving  Do not drive for 24 hours if you were given a medicine to help you relax (sedative).  Do not drive or use heavy machinery while taking prescription pain medicine. General instructions   Take over-the-counter and prescription medicines only as told by your doctor.  Return to your normal activities as told. Ask what activities are safe for you.  Do not use any products that have nicotine or tobacco in them. This includes cigarettes and e-cigarettes. If you need help quitting, ask your doctor.  Keep all follow-up visits as told by your doctor. This is important. It is very important if you had a tissue sample (biopsy) taken. Get help right away if:  You have shortness of breath that gets worse.  You get light-headed.  You feel like you are going to pass out (faint).  You have chest pain.  You cough up: ? More than a little blood. ? More blood than before. Summary  Do not eat or drink anything (not even water) for 2 hours after your test, or until your numbing medicine wears off.  Do not use cigarettes. Do not use e-cigarettes.  Get help right away if you have chest pain. This information is not intended to replace advice given to you by your health care provider. Make sure you discuss any questions you have with your health care provider. Document Revised: 05/06/2017 Document Reviewed: 06/11/2016 Elsevier Patient Education  2020 [employer].

## 2019-10-30 NOTE — Transfer of Care (Signed)
Immediate Anesthesia Transfer of Care Note  Patient: Christopher Harding  Procedure(s) Performed: VIDEO BRONCHOSCOPY WITH ENDOBRONCHIAL ULTRASOUND (N/A ) FINE NEEDLE ASPIRATION (FNA) LINEAR  Patient Location: PACU  Anesthesia Type:General  Level of Consciousness: drowsy  Airway & Oxygen Therapy: Patient Spontanous Breathing and Patient connected to face mask oxygen  Post-op Assessment: Report given to RN and Post -op Vital signs reviewed and stable  Post vital signs: Reviewed and stable  Last Vitals:  Vitals Value Taken Time  BP 109/62 10/30/19 0845  Temp    Pulse 60 10/30/19 0845  Resp 16 10/30/19 0845  SpO2 100 % 10/30/19 0845  Vitals shown include unvalidated device data.  Last Pain:  Vitals:   10/30/19 0651  TempSrc:   PainSc: 0-No pain      Patients Stated Pain Goal: 2 (64/68/03 2122)  Complications: No apparent anesthesia complications

## 2019-11-01 ENCOUNTER — Telehealth: Payer: Self-pay | Admitting: Acute Care

## 2019-11-01 DIAGNOSIS — C3490 Malignant neoplasm of unspecified part of unspecified bronchus or lung: Secondary | ICD-10-CM

## 2019-11-01 LAB — CYTOLOGY - NON PAP

## 2019-11-01 NOTE — Telephone Encounter (Signed)
Thank you for the referral.  We will be happy to see him soon.

## 2019-11-01 NOTE — Telephone Encounter (Signed)
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 Discussed next steps incuding - PET/CT - MRI Brain - PFTs  The tumor appears to cross fissure from RUL to RLL so surgical resection may be more tricky: would need multidisciplinary discussion with [facility].  Have asked Norton Blizzard and Dr. Julien Nordmann to help patient navigate through above steps, appreciate help.  Erskine Emery MD PCCM

## 2019-11-02 ENCOUNTER — Telehealth: Payer: Self-pay | Admitting: *Deleted

## 2019-11-02 ENCOUNTER — Encounter: Payer: Self-pay | Admitting: *Deleted

## 2019-11-02 NOTE — Telephone Encounter (Signed)
I called patient to schedule him to be seen with Dr. Julien Nordmann. I was unable to reach but did leave a vm message for him to call with my name and phone number.

## 2019-11-02 NOTE — Progress Notes (Signed)
I received referral on Mr. Dillenburg today.  I notified scheduling to call and schedule on 6/2 labs at 8:30 and to see Cassie PA at 9:00.

## 2019-11-06 ENCOUNTER — Ambulatory Visit (HOSPITAL_COMMUNITY): Payer: Medicare Other

## 2019-11-06 ENCOUNTER — Encounter (HOSPITAL_COMMUNITY): Payer: Medicare Other

## 2019-11-07 ENCOUNTER — Telehealth: Payer: Self-pay | Admitting: Internal Medicine

## 2019-11-07 ENCOUNTER — Other Ambulatory Visit: Payer: Medicare Other

## 2019-11-07 ENCOUNTER — Telehealth: Payer: Self-pay | Admitting: *Deleted

## 2019-11-07 ENCOUNTER — Ambulatory Visit: Payer: Medicare Other | Admitting: Physician Assistant

## 2019-11-07 ENCOUNTER — Telehealth: Payer: Self-pay | Admitting: Physician Assistant

## 2019-11-07 NOTE — Telephone Encounter (Signed)
I received referral on Christopher Harding. I called to schedule but was unable to reach. I did leave vm message with my name and phone number to call.

## 2019-11-07 NOTE — Telephone Encounter (Signed)
I received a call from patients family. Mr. Christopher Harding does not want to be seen this week in the am. I update new patient to call and schedule on 6/8 1:00 labs then see Christopher Harding at 1:30.

## 2019-11-07 NOTE — Telephone Encounter (Signed)
Mr. Christopher Harding has been scheduled to see Cassie on 6/8 at 1:30pm w/labs at 1pm. Appt date and time has been given to the pt's wife. Aware for her husband to arrive 15 minutes early.

## 2019-11-07 NOTE — Telephone Encounter (Signed)
Spoke with Cinda Quest, she states the appt was made for Tuesday 12:45. Nothing further is needed.

## 2019-11-08 ENCOUNTER — Other Ambulatory Visit: Payer: Self-pay | Admitting: *Deleted

## 2019-11-08 ENCOUNTER — Encounter (HOSPITAL_COMMUNITY): Payer: Medicare Other

## 2019-11-08 NOTE — Progress Notes (Signed)
The proposed treatment discussed in cancer conference 11/08/19 is for discussion purpose only and is not a binding recommendation.  The patient was not physically examined nor present for their treatment options.  Therefore, final treatment plans cannot be decided.

## 2019-11-09 ENCOUNTER — Telehealth: Payer: Self-pay | Admitting: *Deleted

## 2019-11-09 NOTE — Telephone Encounter (Signed)
Per Dr. Julien Nordmann, I called patient to update him on cancer conference discussion.  I called but was unable to reach. I did leave vm message with my name and phone number to call.

## 2019-11-09 NOTE — Telephone Encounter (Signed)
Christopher Harding called me back. I updated him on cancer conference discussion.  He would like to see Dr. Julien Nordmann and Rubin Payor next week.  I did update him that a referral is being made to [facility].  He verbalized understanding.

## 2019-11-10 ENCOUNTER — Other Ambulatory Visit: Payer: Self-pay

## 2019-11-10 ENCOUNTER — Ambulatory Visit (HOSPITAL_COMMUNITY)
Admission: RE | Admit: 2019-11-10 | Discharge: 2019-11-10 | Disposition: A | Discharge status: Home / Self Care | Payer: Medicare Other | Source: Ambulatory Visit | Attending: Internal Medicine | Admitting: Internal Medicine

## 2019-11-10 DIAGNOSIS — C3490 Malignant neoplasm of unspecified part of unspecified bronchus or lung: Secondary | ICD-10-CM

## 2019-11-10 IMAGING — MR MR HEAD WO/W CM
14 series · 48 of 48 positions shown · IV contrast (gadavist)
Comparison: No pertinent prior studies available for comparison.

CLINICAL DATA: Provided history: Malignant neoplasm of unspecified
part of unspecified bronchus or lung. Non-small cell lung cancer,
staging.

EXAM:
MRI HEAD WITHOUT AND WITH CONTRAST
TECHNIQUE: Multiplanar, multiecho pulse sequences of the brain and surrounding
structures were obtained without and with intravenous contrast.
CONTRAST:  7mL GADAVIST GADOBUTROL 1 MMOL/ML IV SOLN

[Series 5: DWI · axial · 3.0mm · 1.36mm/px · z∈[-44,+115]mm · 5 of 108 slices shown (1 of 4)]
[im 1/108]
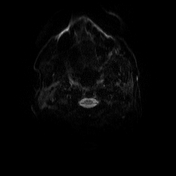
[im 27/108]
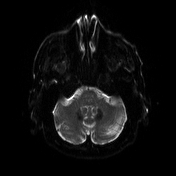
[im 54/108]
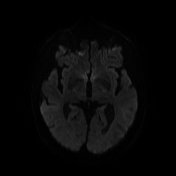
[im 81/108]
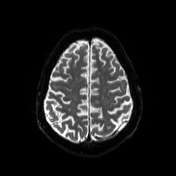
[im 108/108]
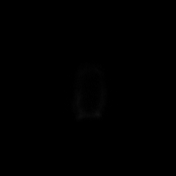

[Series 6: DWI · axial · 3.0mm · 1.36mm/px · z∈[-47,+115]mm · 3 of 56 slices shown (2 of 4)]
[im 1/56]
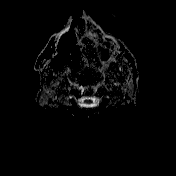
[im 28/56]
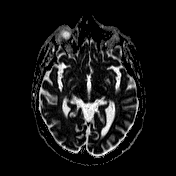
[im 56/56]
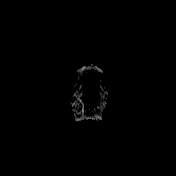

[Series 7: T1 · sagittal · 5.0mm · 0.75mm/px · 1 of 24 slices shown (1 of 2)]
[im 1/24]
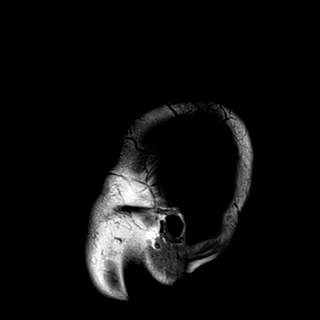

[Series 8: T2 · axial · 5.0mm · 0.60mm/px · 1 of 26 slices shown]
[im 1/26]
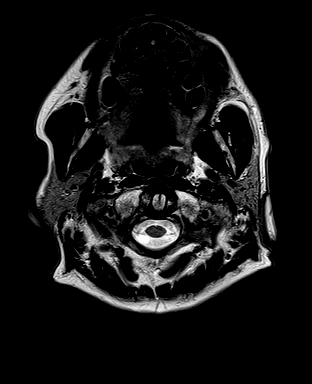

[Series 9: mip_images(sw) · axial · 24.0mm · 0.72mm/px · z∈[-36,+106]mm · 3 of 49 slices shown]
[im 1/49]
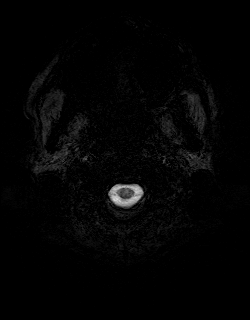
[im 25/49]
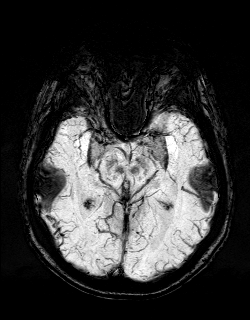
[im 49/49]
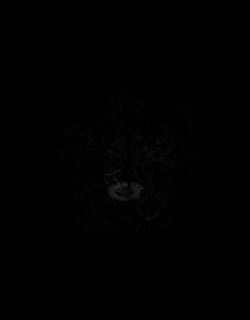

[Series 10: swi_images · axial · 3.0mm · 0.72mm/px · z∈[-46,+116]mm · 3 of 56 slices shown]
[im 1/56]
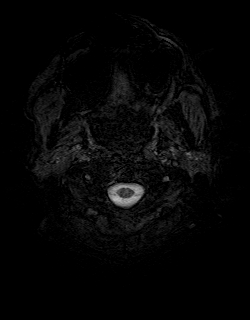
[im 28/56]
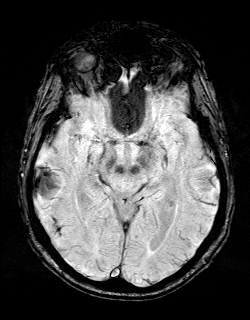
[im 56/56]
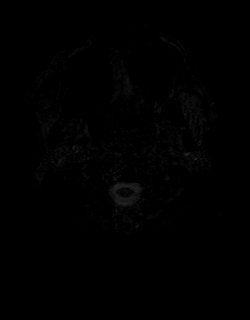

[Series 11: FLAIR · axial · 3.0mm · 0.72mm/px · z∈[-42,+111]mm · 3 of 53 slices shown]
[im 1/53]
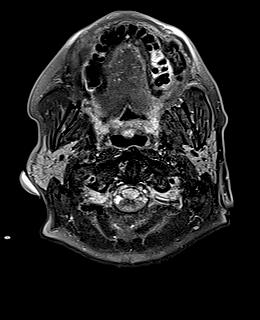
[im 27/53]
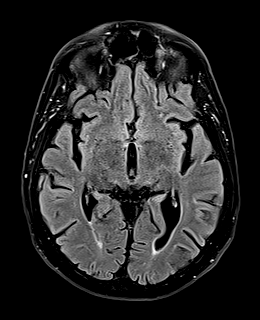
[im 53/53]
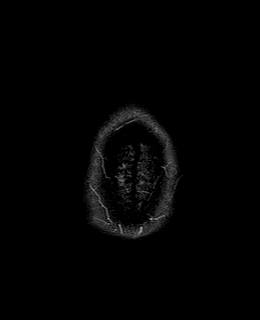

[Series 12: T1 · axial · 1.0mm · 0.90mm/px · z∈[-40,+116]mm · 9 of 160 slices shown (2 of 2)]
[im 1/160]
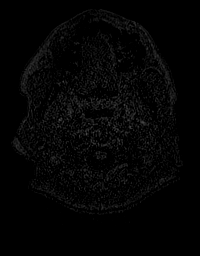
[im 20/160]
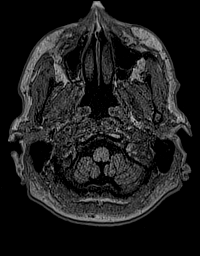
[im 40/160]
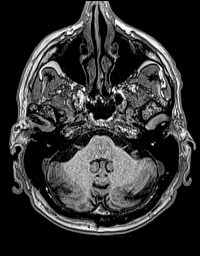
[im 60/160]
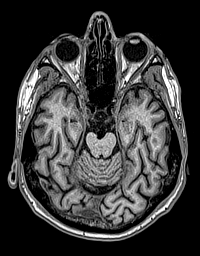
[im 80/160]
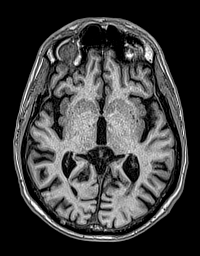
[im 100/160]
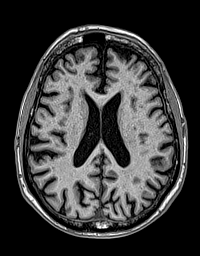
[im 120/160]
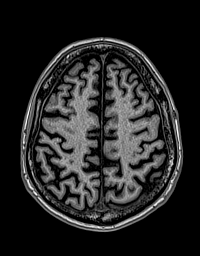
[im 140/160]
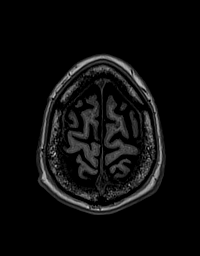
[im 160/160]
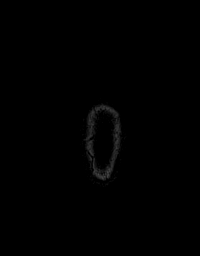

[Series 13: DWI · coronal · 5.0mm · 1.31mm/px · 4 of 69 slices shown (3 of 4)]
[im 1/69]
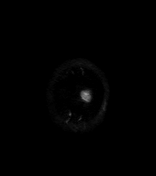
[im 23/69]
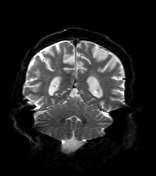
[im 46/69]
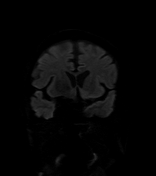
[im 69/69]
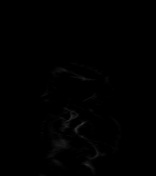

[Series 14: DWI · coronal · 5.0mm · 1.31mm/px · 2 of 36 slices shown (4 of 4)]
[im 1/36]
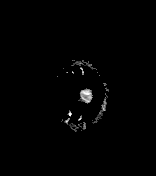
[im 36/36]
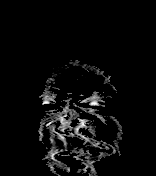

[Series 15: T2 post-contrast · coronal · 5.0mm · 0.57mm/px · 2 of 29 slices shown]
[im 1/29]
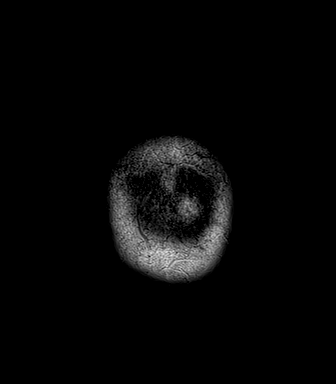
[im 29/29]
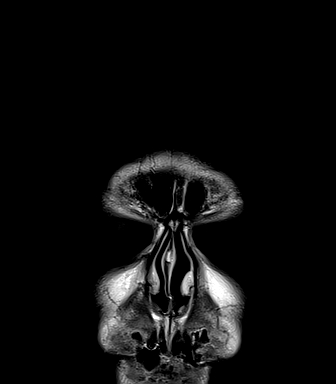

[Series 16: T1 post-contrast · axial · 1.0mm · 0.90mm/px · z∈[-40,+116]mm · 9 of 160 slices shown (1 of 3)]
[im 1/160]
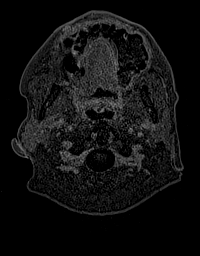
[im 20/160]
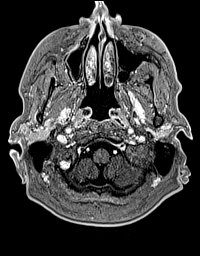
[im 40/160]
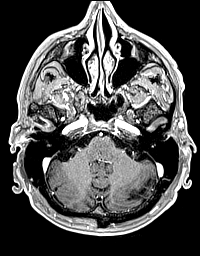
[im 60/160]
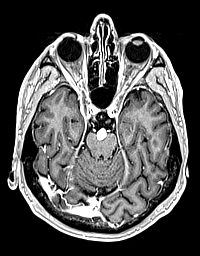
[im 80/160]
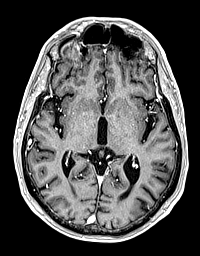
[im 100/160]
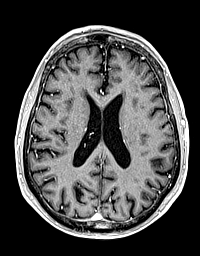
[im 120/160]
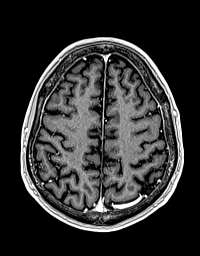
[im 140/160]
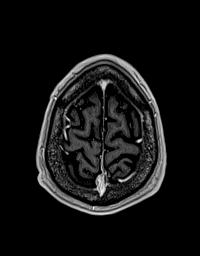
[im 160/160]
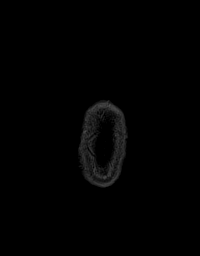

[Series 17: T1 post-contrast · coronal · 5.0mm · 0.43mm/px · 2 of 29 slices shown (2 of 3)]
[im 1/29]
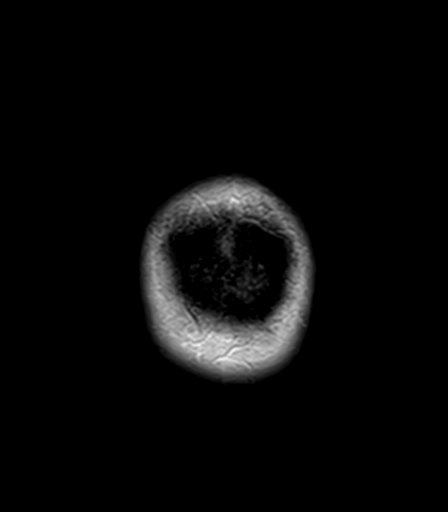
[im 29/29]
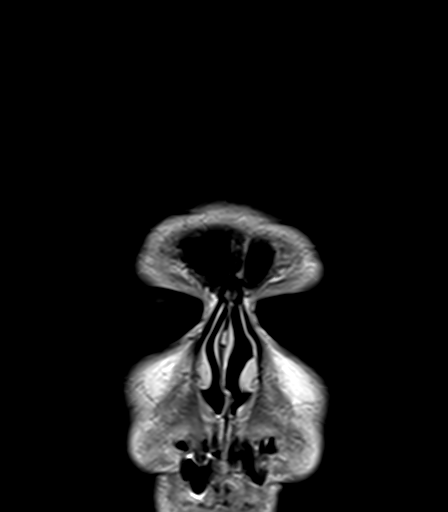

[Series 18: T1 post-contrast · sagittal · 5.0mm · 0.75mm/px · 1 of 24 slices shown (3 of 3)]
[im 1/24]
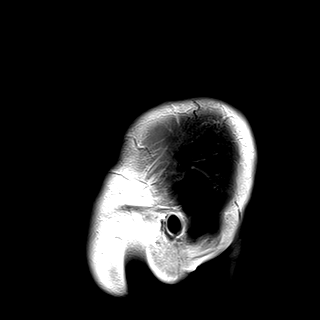

[48 of 48 positions shown; findings below may reference images not displayed]

FINDINGS: Brain:

Mild generalized parenchymal atrophy.

Mild patchy T2/FLAIR hyperintensity within the cerebral white matter
and pons is nonspecific, but likely reflects chronic small vessel
ischemic disease. Chronic lacunar infarct within the right
cerebellum.

There is no acute infarct.

No evidence of intracranial mass.

No chronic intracranial blood products.

No extra-axial fluid collection.

No midline shift.

No evidence of intracranial metastatic disease.

Vascular: 10 x 7 mm basilar tip aneurysm (series 16, image 59).
Expected proximal arterial flow voids.

Skull and upper cervical spine: No focal marrow lesion.

Sinuses/Orbits: Right lens replacement. Visualized orbits show no
acute finding. No significant paranasal sinus disease or mastoid
effusion at the imaged levels.

Impression #5 below will be called to the ordering clinician or
representative by the Radiologist Assistant, and communication
documented in the PACS or [REDACTED].
IMPRESSION: 1. No evidence of intracranial metastatic disease.
2. Mild chronic small vessel ischemic changes within the cerebral
white matter and pons.
3. Chronic right cerebellar lacunar infarct.
4. Mild generalized parenchymal atrophy.
5. 10 x 7 mm basilar tip aneurysm. CT or MR angiography is
recommended for further characterization.

## 2019-11-10 MED ORDER — GADOBUTROL 1 MMOL/ML IV SOLN
7.0000 mL | Freq: Once | INTRAVENOUS | Status: AC | PRN
  Administered 2019-11-10: 7 mL via INTRAVENOUS

## 2019-11-12 ENCOUNTER — Other Ambulatory Visit: Payer: Self-pay

## 2019-11-12 ENCOUNTER — Telehealth: Payer: Self-pay | Admitting: Internal Medicine

## 2019-11-12 ENCOUNTER — Other Ambulatory Visit: Payer: Self-pay | Admitting: Physician Assistant

## 2019-11-12 ENCOUNTER — Ambulatory Visit (HOSPITAL_COMMUNITY)
Admission: RE | Admit: 2019-11-12 | Discharge: 2019-11-12 | Disposition: A | Discharge status: Home / Self Care | Payer: Medicare Other | Source: Ambulatory Visit | Attending: Internal Medicine | Admitting: Internal Medicine

## 2019-11-12 DIAGNOSIS — I700 Atherosclerosis of aorta: Secondary | ICD-10-CM | POA: Insufficient documentation

## 2019-11-12 DIAGNOSIS — I2510 Atherosclerotic heart disease of native coronary artery without angina pectoris: Secondary | ICD-10-CM | POA: Insufficient documentation

## 2019-11-12 DIAGNOSIS — C3490 Malignant neoplasm of unspecified part of unspecified bronchus or lung: Secondary | ICD-10-CM | POA: Diagnosis not present

## 2019-11-12 DIAGNOSIS — R918 Other nonspecific abnormal finding of lung field: Secondary | ICD-10-CM

## 2019-11-12 LAB — GLUCOSE, CAPILLARY: Glucose-Capillary: 77 mg/dL (ref 70–99)

## 2019-11-12 IMAGING — CT NM PET TUM IMG INITIAL (PI) SKULL BASE T - THIGH
1 of 8 series · 1 of 25 positions shown · non-contrast
Comparison: CT chest [DATE].

CLINICAL DATA: Initial treatment strategy for lung cancer.

EXAM:
NUCLEAR MEDICINE PET SKULL BASE TO THIGH
TECHNIQUE: 8.8 mCi F-18 FDG was injected intravenously. Full-ring PET imaging
was performed from the skull base to thigh after the radiotracer. CT
data was obtained and used for attenuation correction and anatomic
localization.
Fasting blood glucose: 77 mg/dl

[Series 4: ct sk_thigh 5.0 b31f · axial · 5.0mm · 0.98mm/px · 1 of 247 slices shown]
[im 1/247  soft-tissue]
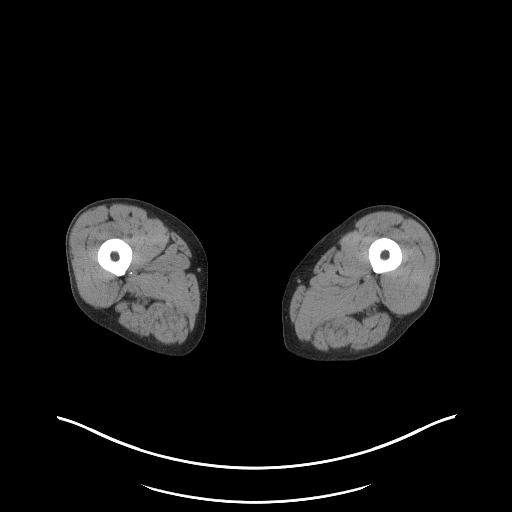

[1 of 25 positions shown; findings below may reference images not displayed]

FINDINGS: Mediastinal blood pool activity: SUV max

Liver activity: SUV max NA

NECK:

No abnormal hypermetabolism.

Incidental CT findings: None.

CHEST:

Right hilar hypermetabolism has an SUV max of 9.8 and corresponds to
an 11 mm lymph node (4/83). There is focal hypermetabolism
associated with the mid esophagus versus a subcarinal lymph node,
with an SUV max of 21.0. Absence of IV contrast is limiting on CT. A
mass in the posterior segment right upper lobe measures 5.4 x 5.6 cm
with an SUV max of 17.6.

Incidental CT findings:

Atherosclerotic calcification of the aorta, aortic valve and
coronary arteries. Heart size normal. No pericardial effusion. Very
mild postobstructive pneumonitis in the right upper lobe and
superior segment right lower lobe. Calcified granuloma in the right
lower lobe.

ABDOMEN/PELVIS:

No abnormal hypermetabolism in the liver, adrenal glands, spleen or
pancreas. No hypermetabolic lymph nodes.

Incidental CT findings:

Liver is unremarkable. There may be sludge layering in the
gallbladder. Adrenal glands, kidneys, spleen, pancreas, stomach and
bowel are grossly unremarkable. Atherosclerotic calcification of the
aorta without aneurysm. Slight bladder wall trabeculation.

SKELETON:

No abnormal hypermetabolism.

Incidental CT findings:

Degenerative changes in the spine.
IMPRESSION: 1. Hypermetabolic right upper lobe mass and ipsilateral right hilar
adenopathy, findings most consistent with primary bronchogenic
carcinoma.
2. Hypermetabolic subcarinal lymph node versus esophageal mass.
3. Aortic atherosclerosis ([K9]-[K9]). Coronary artery
calcification.

## 2019-11-12 MED ORDER — FLUDEOXYGLUCOSE F - 18 (FDG) INJECTION
8.7500 | Freq: Once | INTRAVENOUS | Status: AC | PRN
  Administered 2019-11-12: 8.75 via INTRAVENOUS

## 2019-11-12 NOTE — Telephone Encounter (Signed)
Dr. Tamala Julian received call from [facility] imaging. 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 Expected proximal arterial flow voids.  IMPRESSION: 1. No evidence of intracranial metastatic disease. 2. Mild chronic small vessel ischemic changes within the cerebral white matter and pons. 3. Chronic right cerebellar lacunar infarct. 4. Mild generalized parenchymal atrophy. 5. 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 CT or MR angiography is recommended for further characterization.

## 2019-11-13 ENCOUNTER — Other Ambulatory Visit: Payer: Self-pay | Admitting: Internal Medicine

## 2019-11-13 ENCOUNTER — Telehealth: Payer: Self-pay | Admitting: *Deleted

## 2019-11-13 ENCOUNTER — Inpatient Hospital Stay: Payer: Medicare Other | Attending: Physician Assistant | Admitting: Physician Assistant

## 2019-11-13 ENCOUNTER — Other Ambulatory Visit: Payer: Self-pay

## 2019-11-13 ENCOUNTER — Encounter: Payer: Self-pay | Admitting: *Deleted

## 2019-11-13 ENCOUNTER — Encounter: Payer: Self-pay | Admitting: Physician Assistant

## 2019-11-13 ENCOUNTER — Inpatient Hospital Stay: Payer: Medicare Other

## 2019-11-13 VITALS — BP 131/68 | HR 56 | Temp 97.6°F | Resp 18 | Ht 74.0 in | Wt 168.3 lb

## 2019-11-13 DIAGNOSIS — F17210 Nicotine dependence, cigarettes, uncomplicated: Secondary | ICD-10-CM | POA: Diagnosis not present

## 2019-11-13 DIAGNOSIS — C3491 Malignant neoplasm of unspecified part of right bronchus or lung: Secondary | ICD-10-CM

## 2019-11-13 DIAGNOSIS — Z79899 Other long term (current) drug therapy: Secondary | ICD-10-CM | POA: Insufficient documentation

## 2019-11-13 DIAGNOSIS — Z5111 Encounter for antineoplastic chemotherapy: Secondary | ICD-10-CM

## 2019-11-13 DIAGNOSIS — Z7901 Long term (current) use of anticoagulants: Secondary | ICD-10-CM | POA: Diagnosis not present

## 2019-11-13 DIAGNOSIS — C3411 Malignant neoplasm of upper lobe, right bronchus or lung: Secondary | ICD-10-CM | POA: Diagnosis present

## 2019-11-13 DIAGNOSIS — C3431 Malignant neoplasm of lower lobe, right bronchus or lung: Secondary | ICD-10-CM | POA: Insufficient documentation

## 2019-11-13 DIAGNOSIS — Z720 Tobacco use: Secondary | ICD-10-CM

## 2019-11-13 DIAGNOSIS — I10 Essential (primary) hypertension: Secondary | ICD-10-CM | POA: Diagnosis not present

## 2019-11-13 DIAGNOSIS — I4891 Unspecified atrial fibrillation: Secondary | ICD-10-CM | POA: Insufficient documentation

## 2019-11-13 DIAGNOSIS — Z7189 Other specified counseling: Secondary | ICD-10-CM | POA: Diagnosis not present

## 2019-11-13 DIAGNOSIS — R918 Other nonspecific abnormal finding of lung field: Secondary | ICD-10-CM

## 2019-11-13 LAB — CMP (CANCER CENTER ONLY)
ALT: 11 U/L (ref 0–44)
AST: 22 U/L (ref 15–41)
Albumin: 3.8 g/dL (ref 3.5–5.0)
Alkaline Phosphatase: 52 U/L (ref 38–126)
Anion gap: 11 (ref 5–15)
BUN: 6 mg/dL — ABNORMAL LOW (ref 8–23)
CO2: 23 mmol/L (ref 22–32)
Calcium: 9.4 mg/dL (ref 8.9–10.3)
Chloride: 95 mmol/L — ABNORMAL LOW (ref 98–111)
Creatinine: 0.79 mg/dL (ref 0.61–1.24)
GFR, Est AFR Am: >60 mL/min (ref >60)
GFR, Estimated: >60 mL/min (ref >60)
Glucose, Bld: 104 mg/dL — ABNORMAL HIGH (ref 70–99)
Potassium: 4.7 mmol/L (ref 3.5–5.1)
Sodium: 129 mmol/L — ABNORMAL LOW (ref 135–145)
Total Bilirubin: 0.5 mg/dL (ref 0.3–1.2)
Total Protein: 7.5 g/dL (ref 6.5–8.1)

## 2019-11-13 LAB — CBC WITH DIFFERENTIAL (CANCER CENTER ONLY)
Abs Immature Granulocytes: 0.03 10*3/uL (ref 0.00–0.07)
Basophils Absolute: 0 10*3/uL (ref 0.0–0.1)
Basophils Relative: 1 %
Eosinophils Absolute: 0.1 10*3/uL (ref 0.0–0.5)
Eosinophils Relative: 1 %
HCT: 38.3 % — ABNORMAL LOW (ref 39.0–52.0)
Hemoglobin: 13.8 g/dL (ref 13.0–17.0)
Immature Granulocytes: 0 %
Lymphocytes Relative: 13 %
Lymphs Abs: 0.9 10*3/uL (ref 0.7–4.0)
MCH: 33.9 pg (ref 26.0–34.0)
MCHC: 36 g/dL (ref 30.0–36.0)
MCV: 94.1 fL (ref 80.0–100.0)
Monocytes Absolute: 0.8 10*3/uL (ref 0.1–1.0)
Monocytes Relative: 11 %
Neutro Abs: 5.3 10*3/uL (ref 1.7–7.7)
Neutrophils Relative %: 74 %
Platelet Count: 237 10*3/uL (ref 150–400)
RBC: 4.07 MIL/uL — ABNORMAL LOW (ref 4.22–5.81)
RDW: 12.7 % (ref 11.5–15.5)
WBC Count: 7.1 10*3/uL (ref 4.0–10.5)
nRBC: 0 % (ref 0.0–0.2)

## 2019-11-13 MED ORDER — PROCHLORPERAZINE MALEATE 10 MG PO TABS: 10.0000 mg | ORAL_TABLET | Freq: Four times a day (QID) | ORAL | 2 refills | Status: DC | PRN

## 2019-11-13 NOTE — Telephone Encounter (Signed)
Per Dr. Julien Nordmann, I notified path dept to send recent tissue for foundation one and PDL 1 testing

## 2019-11-13 NOTE — Patient Instructions (Addendum)
-  There are two main categories of lung cancer, they are named based on the size of the cancer cell. One is called Non-Small cell lung cancer. 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 Here are the the main points that were discussed at your office visit with [place] today:  -The treatment that you will receive consists of two chemotherapy drugs called Carboplatin and Paclitaxel (also called Taxol) -We are planning on starting your treatment next week on 11/26/19 but before your start your treatment, I would like you to attend a Chemotherapy Education Class. This involves having you sit down with one of our nurse educators. She will discuss with you one-on-one more details about your treatment as well as general information about resources here at the [place] treatment will be given every week for about 6 weeks or so (as long as you are also receiving radiation). We will check your labs (blood work) once a week . Dr. Julien Nordmann or I will see you every other treatment just to make sure that you are doing well and that everything is on track. -We will get a CT scan about 3 weeks after you complete your treatment  Medications:  -Compazine was sent to your pharmacy. This medication is for nausea. You may take this every 6 hours as needed if you feel nausous.   Referrals -I am going to place a referral to radiation oncology. They are located in this building in the basement. They will call you to arrange for a consultation  -I am going to also place a referral to gastroenterology so they can consider an endoscopy. This would involve using a camera to look in the esophagus to see if there is anything inside the esophagus that made the scan light up in this  region. If they don't see anything, likely it is a lymph node outside the esophagus which changes the stage of the disease.   Follow Up:  -We will see you back for a follow up visit in 2 weeks for evaluation before starting

## 2019-11-13 NOTE — Progress Notes (Signed)
[place] [phone]   [phone]) [phone]  CONSULT NOTE  REFERRING PHYSICIAN: Dr. Tamala Julian  REASON FOR CONSULTATION:  Non-Small Cell Lung Cancer, Adenocarcinoma  HPI Christopher Harding is a 69 y.o. male with a past medical history significant for atrial fibrillation, hypertension, and tobacco use is referred to the clinic for evaluation of recently diagnosed non-small cell lung cancer, adenocarcinoma.   On October 01, 2019, the patient underwent a routine cancer screening low-dose CT scan which was recommended by his PCP.  The scan noted a 48.6 mm posterior right upper lobe lung mass with direct extension across the major fissure into the superior segment of the right lower lobe.   On Oct 30, 2019, the patient underwent a bronchoscopy under the care of Dr. Tamala Julian.  The final pathology ([id number]) was consistent with non-small cell lung cancer, adenocarcinoma.  The patient had his initial staging brain MRI performed on 11/10/2019 which is negative for any metastatic disease to the brain.  The patient underwent his staging PET scan on 11/12/2019 which noted a 5.4 x 5.6 cm right upper lobe lung mass with an SUV of 17.6.  The patient also had right hilar hypermetabolism with an SUV of 9.8.  There was also focal hypermetabolism associate with the midesophagus versus a subcarinal lymph node with an SUV max of 21.   Today, the patient is feeling fairly well today.  He denies any recent fever, chills, night sweats, or weight loss.  He denies any chest pain, cough, or hemoptysis.  He reports increased shortness of breath since being diagnosed with this condition, but he is unsure if this is attributed to anxiety regarding his recent diagnosis.  He denies any nausea, vomiting, diarrhea, constipation, dysphagia, or odynophagia.  He denies any headache or visual changes.   The patient's family history consists of a mother who died at the age of 44 without any known medical  problems except for dementia.  The patient's father also was reportedly healthy without any medical conditions except he had a stroke at the age of 41.  The patient has a sister without any medical problems.  The patient denies any other family history of malignancy.  The patient works as an Tour manager.  He is married and has 2 children.  The patient estimates that he is smoked for approximately 50 years 1 pack/day.  The patient reports drinking approximately 4-5 alcoholic beverages per night.  He denies any history of drug use.  HPI  Past Medical History:  Diagnosis Date  . A-fib [facility])    s/p DCCV 12/07/18  . Depression    situational  . Dysrhythmia   . Hypertension     Past Surgical History:  Procedure Laterality Date  . CARDIOVERSION N/A 12/07/2018   Procedure: CARDIOVERSION;  Surgeon: Josue Hector, MD;  Location: [facility] ENDOSCOPY;  Service: Cardiovascular;  Laterality: N/A;  . FINE NEEDLE ASPIRATION  10/30/2019   Procedure: FINE NEEDLE ASPIRATION (FNA) LINEAR;  Surgeon: Candee Furbish, MD;  [facility] END[facility];  Service: Pulmonary;;  . INGUINAL HERNIA REPAIR Right 01/25/2019   Procedure: RIGHT INGUINAL HERNIA REPAIR WITH MESH;  Surgeon: Coralie Keens, MD;  Location: [place];  Service: General;  Laterality: Right;  TAP BLOCK  . INSERTION OF MESH Right 01/25/2019   Procedure: Insertion Of Mesh;  Surgeon: Coralie Keens, MD;  Location: [place];  Service: General;  Laterality: Right;  Marland Kitchen VIDEO BRONCHOSCOPY WITH ENDOBRONCHIAL ULTRASOUND N/A 10/30/2019   Procedure: VIDEO BRONCHOSCOPY WITH  ENDOBRONCHIAL ULTRASOUND;  Surgeon: Candee Furbish, MD;  Location: [facility] ENDOSCOPY;  Service: Pulmonary;  Laterality: N/A;    No family history on file.  Social History Social History   Tobacco Use  . Smoking status: Current Every Day Smoker    Packs/day: 0.50    Years: 45.00    Pack years: 22.50    Types: Cigarettes  . Smokeless tobacco: Never Used  Substance Use Topics  . Alcohol  use: Yes    Alcohol/week: 50.0 standard drinks    Types: 50 Cans of beer per week  . Drug use: No    No Known Allergies  Current Outpatient Medications  Medication Sig Dispense Refill  . amlodipine-benazepril (LOTREL) 2.5-10 MG capsule Take 1 capsule by mouth daily. 90 capsule 3  . ibuprofen (ADVIL) 200 MG tablet Take 400 mg by mouth every 8 (eight) hours as needed (for pain).    . metoprolol tartrate (LOPRESSOR) 50 MG tablet Take 50 mg by mouth 2 (two) times daily with a meal.    . tetrahydrozoline 0.05 % ophthalmic solution Place 1 drop into both eyes daily as needed (eye irritation).    Alveda Reasons 20 MG TABS tablet Take 20 mg by mouth daily in the afternoon.     . prochlorperazine (COMPAZINE) 10 MG tablet Take 1 tablet (10 mg total) by mouth every 6 (six) hours as needed. 30 tablet 2   No current facility-administered medications for this visit.    Review of Systems REVIEW OF SYSTEMS:   Review of Systems  Constitutional: Negative for appetite change, chills, fatigue, fever and unexpected weight change.  HENT: Negative for mouth sores, nosebleeds, sore throat and trouble swallowing.   Eyes: Negative for eye problems and icterus.  Respiratory: Positive for mild shortness of breath. Negative for cough, hemoptysis, and wheezing.   Cardiovascular: Negative for chest pain and leg swelling.  Gastrointestinal: Negative for abdominal pain, constipation, diarrhea, nausea and vomiting.  Genitourinary: Negative for bladder incontinence, difficulty urinating, dysuria, frequency and hematuria.   Musculoskeletal: Negative for back pain, gait problem, neck pain and neck stiffness.  Skin: Negative for itching and rash.  Neurological: Negative for dizziness, extremity weakness, gait problem, headaches, light-headedness and seizures.  Hematological: Negative for adenopathy. Does not bruise/bleed easily.  Psychiatric/Behavioral: Negative for confusion, depression and sleep disturbance. The patient is  not nervous/anxious.     PHYSICAL EXAMINATION:  Blood pressure 131/68, pulse (!) 56, temperature 97.6 F (36.4 C), temperature source Temporal, resp. rate 18, height '6\' 2"'$  (1.88 m), weight 168 lb 4.8 oz (76.3 kg), SpO2 100 %.  ECOG PERFORMANCE STATUS: 0  Physical Exam  Constitutional: Oriented to person, place, and time and well-developed, well-nourished, and in no distress.  HENT:  Head: Normocephalic and atraumatic.  Mouth/Throat: Oropharynx is clear and moist. No oropharyngeal exudate.  Eyes: Conjunctivae are normal. Right eye exhibits no discharge. Left eye exhibits no discharge. No scleral icterus.  Neck: Normal range of motion. Neck supple.  Cardiovascular: Normal rate, regular rhythm, normal heart sounds and intact distal pulses.   Pulmonary/Chest: Effort normal. Quiet breath sounds in all lung fields. No respiratory distress. No wheezes. No rales.  Abdominal: Soft. Bowel sounds are normal. Exhibits no distension and no mass. There is no tenderness.  Musculoskeletal: Normal range of motion. Exhibits no edema.  Lymphadenopathy:    No cervical adenopathy.  Neurological: Alert and oriented to person, place, and time. Exhibits normal muscle tone. Gait normal. Coordination normal.  Skin: Skin is warm and dry. No rash  noted. Not diaphoretic. No erythema. No pallor.  Psychiatric: Mood, memory and judgment normal.  Vitals reviewed.  LABORATORY DATA: Lab Results  Component Value Date   WBC 7.1 11/13/2019   HGB 13.8 11/13/2019   HCT 38.3 (L) 11/13/2019   MCV 94.1 11/13/2019   PLT 237 11/13/2019      Chemistry      Component Value Date/Time   NA 129 (L) 11/13/2019 1250   NA 129 (L) 11/24/2018 1520   K 4.7 11/13/2019 1250   CL 95 (L) 11/13/2019 1250   CO2 23 11/13/2019 1250   BUN 6 (L) 11/13/2019 1250   BUN 8 11/24/2018 1520   CREATININE 0.79 11/13/2019 1250      Component Value Date/Time   CALCIUM 9.4 11/13/2019 1250   ALKPHOS 52 11/13/2019 1250   AST 22 11/13/2019  1250   ALT 11 11/13/2019 1250   BILITOT 0.5 11/13/2019 1250       RADIOGRAPHIC STUDIES: MR BRAIN W WO CONTRAST  Result Date: 11/11/2019 CLINICAL DATA:  Provided history: Malignant neoplasm of unspecified part of unspecified bronchus or lung. Non-small cell lung cancer, staging. EXAM: MRI HEAD WITHOUT AND WITH CONTRAST TECHNIQUE: Multiplanar, multiecho pulse sequences of the brain and surrounding structures were obtained without and with intravenous contrast. CONTRAST:  34m GADAVIST GADOBUTROL 1 MMOL/ML IV SOLN COMPARISON:  No pertinent prior studies available for comparison. FINDINGS: Brain: Mild generalized parenchymal atrophy. Mild patchy T2/FLAIR hyperintensity within the cerebral white matter and pons is nonspecific, but likely reflects chronic small vessel ischemic disease. Chronic lacunar infarct within the right cerebellum. There is no acute infarct. No evidence of intracranial mass. No chronic intracranial blood products. No extra-axial fluid collection. No midline shift. No evidence of intracranial metastatic disease. 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 Expected proximal arterial flow voids. Skull and upper cervical spine: No focal marrow lesion. Sinuses/Orbits: Right lens replacement. Visualized orbits show no acute finding. No significant paranasal sinus disease or mastoid effusion at the imaged levels. Impression #5 below will be called to the ordering clinician or representative by the Radiologist Assistant, and communication documented in the PACS or [employer] IMPRESSION: 1. No evidence of intracranial metastatic disease. 2. Mild chronic small vessel ischemic changes within the cerebral white matter and pons. 3. Chronic right cerebellar lacunar infarct. 4. Mild generalized parenchymal atrophy. 5. 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 CT or MR angiography is recommended for further characterization. Electronically Signed   By: KKellie SimmeringDO   On: 11/11/2019 21:16    NM PET Image Initial (PI) Skull Base To Thigh  Result Date: 11/12/2019 CLINICAL DATA:  Initial treatment strategy for lung cancer. EXAM: NUCLEAR MEDICINE PET SKULL BASE TO THIGH TECHNIQUE: 8.8 mCi F-18 FDG was injected intravenously. Full-ring PET imaging was performed from the skull base to thigh after the radiotracer. CT data was obtained and used for attenuation correction and anatomic localization. Fasting blood glucose: 77 mg/dl COMPARISON:  CT chest 10/01/2019. FINDINGS: Mediastinal blood pool activity: SUV max 2.7 Liver activity: SUV max NA NECK: No abnormal hypermetabolism. Incidental CT findings: None. CHEST: Right hilar hypermetabolism has an SUV max of 9.8 and corresponds to an 11 mm lymph node (4/83). There is focal hypermetabolism associated with the mid esophagus versus a subcarinal lymph node, with an SUV max of 21.0. Absence of IV contrast is limiting on CT. A mass in the posterior segment right upper lobe measures 5.4 x 5.6 cm with an SUV max of  17.6. Incidental CT findings: Atherosclerotic calcification of the aorta, aortic valve and coronary arteries. Heart size normal. No pericardial effusion. Very mild postobstructive pneumonitis in the right upper lobe and superior segment right lower lobe. Calcified granuloma in the right lower lobe. ABDOMEN/PELVIS: No abnormal hypermetabolism in the liver, adrenal glands, spleen or pancreas. No hypermetabolic lymph nodes. Incidental CT findings: Liver is unremarkable. There may be sludge layering in the gallbladder. Adrenal glands, kidneys, spleen, pancreas, stomach and bowel are grossly unremarkable. Atherosclerotic calcification of the aorta without aneurysm. Slight bladder wall trabeculation. SKELETON: No abnormal hypermetabolism. Incidental CT findings: Degenerative changes in the spine. IMPRESSION: 1. Hypermetabolic right upper lobe mass and ipsilateral right hilar adenopathy, findings most consistent with primary bronchogenic carcinoma. 2.  Hypermetabolic subcarinal lymph node versus esophageal mass. 3. Aortic atherosclerosis (ICD10-I70.0). Coronary artery calcification. Electronically Signed   By: Lorin Picket M.D.   On: 11/12/2019 13:03   DG CHEST PORT 1 VIEW  Result Date: 10/30/2019 CLINICAL DATA:  Status post right lung mass biopsy. EXAM: PORTABLE CHEST 1 VIEW COMPARISON:  Chest CT, 10/01/2019 FINDINGS: Mass projecting superiorly and laterally from the right hilum, measuring approximately 5.8 cm, is without change from the previous chest CT. Lungs are hyperexpanded but otherwise clear. No pleural effusion. No pneumothorax. Cardiac silhouette is normal in size. No mediastinal or left hilar masses. Skeletal structures are grossly intact IMPRESSION: 1. No acute cardiopulmonary disease. No evidence of a complication following right lung mass biopsy. 2. Right upper lung mass extending from the superior right hilum is stable compared to the prior chest CT. Electronically Signed   By: Lajean Manes M.D.   On: 10/30/2019 09:32    ASSESSMENT: This is a very pleasant 69 year old Caucasian male recently diagnosed with stage III (T3, N1/N2, M0) non-small cell lung cancer, adenocarcinoma.  He presented with a right upper lobe lung mass with direct extension across the major fissure into the superior segment of the right lower lobe and ipsilateral hilar lymphadenopathy.  There is questionable subcarinal lymphadenopathy versus esophageal hypermetabolism.  The patient was diagnosed in May 2021.  PD-L1 and molecular studies pending   PLAN: The patient was seen with Dr. Julien Nordmann today.  The patient's recent PET scan was reviewed.  Dr. Julien Nordmann had a lengthy discussion with the patient and his wife about his current condition and treatment options.  Given the suspicious subcarinal lymphadenopathy, that would make the patient a stage IIIb, in which case, surgery would not be an option.  Therefore, Dr. Julien Nordmann recommends that patient undergo concurrent  chemoradiation with carboplatin for an AUC of 2 and paclitaxel 45 mgm2.  The patient is interested in this option.  Dr. Julien Nordmann recommends endoscopic evaluation of the esophagus to further evaluate the hypermetabolism which was noted on the staging PET scan.  We will start his treatment on 11/26/2019 to allow time for the patient to have a consultation with gastroenterology as well as radiation oncology.  I will arrange for the patient to have a chemo education class prior to starting his first cycle of treatment.  I sent a prescription for Compazine 10 mg p.o. every 6 hours as needed for nausea to the patient's pharmacy.  We will see the patient back for follow-up visit in 2 weeks for evaluation before starting cycle #1 of his treatment.  The patient voices understanding of current disease status and treatment options and is in agreement with the current care plan.  All questions were answered. The patient knows to call the clinic with any  problems, questions or concerns. We can certainly see the patient much sooner if necessary.  Thank you so much for allowing me to participate in the care of Christopher Harding. I will continue to follow up the patient with you and assist in his care.   The total time spent in the appointment was 80 minutes.  Disclaimer: This note was dictated with voice recognition software. Similar sounding words can inadvertently be transcribed and may not be corrected upon review.   Constantino Starace L Mizuki Hoel November 13, 2019, 2:51 PM   ADDENDUM: Hematology/Oncology Attending: I had a face-to-face encounter with the patient today.  I recommended his care plan.  This is a very pleasant 69 years old white male with long history of smoking.  He was seen by his primary care physician and recommended for him to have CT screening of the chest to rule out any early lung cancer.  The scan was performed on 10/01/2019 and unfortunately showed an irregular solid posterior right upper lobe  lung mass measuring 4.9 cm with direct extension across the major fissure into the superior segment of the right lower lobe with additional scattered pulmonary nodules in both lungs the largest nonsolid posterior right upper lobe nodule measured 1.3 cm.  The patient was seen by Dr. Ina Homes and he underwent bronchoscopy on 10/30/2019 and the final pathology ([id number]) showed malignant cells consistent with non-small cell carcinoma. The malignant cells are positive for TTF-1 and napsin-A. They are negative for cytokeratin 5/6 and p63. The findings are consistent with primary lung adenocarcinoma.  The patient had a PET scan on 11/12/2019 and that showed hypermetabolic right upper lobe lung mass in addition to ipsilateral right hilar adenopathy and the finding most consistent with primary bronchogenic carcinoma.  There was also hypermetabolic subcarinal lymph node versus esophageal mass.  MRI of the brain on 11/10/2019 was negative for metastatic disease to the brain The patient was referred to me today for further evaluation and recommendation regarding his condition. When seen today the patient is feeling fine but very anxious about his condition. I personally and independently reviewed the scan images and discussed the result and showed the images to the patient and his wife today. I explained to the patient that he has stage IIIB (T3, N2, M0) non-small cell lung cancer, adenocarcinoma.   I will refer the patient to gastroenterology for consideration of upper endoscopy for evaluation of the suspicious esophageal mass versus subcarinal lymphadenopathy. If the mass is consistent with subcarinal lymphadenopathy, the patient is definitely not a surgical candidate and he will be treated with a course of concurrent chemoradiation.  Even in the absence of the subcarinal lymphadenopathy he probably not a great candidate for surgical resection because of the transfacial extension of the tumor. We will request  the tissue block to be sent for molecular studies as well as PD-L1 expression I recommended for him referral to radiation oncology for consideration of a course of concurrent chemoradiation with weekly carboplatin for AUC of 2 and paclitaxel 45 NG/M2 for 6-7 weeks.  This would be followed by consolidation immunotherapy if the patient has no evidence for disease progression after the induction phase. I discussed with the patient the adverse effect of the chemotherapy including but not limited to alopecia, myelosuppression, nausea and vomiting, peripheral neuropathy, liver or renal dysfunction. He is expected to start the first cycle of this treatment on November 26, 2019. We will arrange for the patient to have a chemotherapy education class before the first dose  of treatment. He will come back for follow-up visit on the first day of his treatment. We will send a nausea medicine to his pharmacy. He was advised to call immediately if he has any concerning symptoms in the interval.  Disclaimer: This note was dictated with voice recognition software. Similar sounding words can inadvertently be transcribed and may be missed upon review. Eilleen Kempf, MD 11/13/19

## 2019-11-13 NOTE — Progress Notes (Signed)

## 2019-11-14 ENCOUNTER — Telehealth: Payer: Self-pay | Admitting: Physician Assistant

## 2019-11-14 ENCOUNTER — Telehealth: Payer: Self-pay | Admitting: *Deleted

## 2019-11-14 NOTE — Telephone Encounter (Signed)
Per Dr. Julien Nordmann, I updated pathology to send PDL 1.  Tissue is being sent for testing

## 2019-11-14 NOTE — Telephone Encounter (Signed)
Scheduled appts per 6/8 los. Left voicemail with appt date and time.

## 2019-11-15 ENCOUNTER — Telehealth: Payer: Self-pay | Admitting: *Deleted

## 2019-11-15 ENCOUNTER — Telehealth: Payer: Self-pay | Admitting: Gastroenterology

## 2019-11-15 NOTE — Telephone Encounter (Signed)
Please schedule with Ellouise Newer tomorrow in her 1:30 slot. Thank you.  KLB  Anderson Malta, thank you for seeing this patient. Needs urgent evaluation to determine endoscopy candidacy. Thank you.

## 2019-11-15 NOTE — Telephone Encounter (Signed)
Your earlier message mentions appointments with APPs available in the middle of the month. When is that availability?

## 2019-11-15 NOTE — Telephone Encounter (Signed)
Sorry, mid July--July 14 is the earliest.

## 2019-11-15 NOTE — Telephone Encounter (Signed)
Oncology Nurse Navigator Documentation  Oncology Nurse Navigator Flowsheets 11/15/2019  Abnormal Finding Date -  Confirmed Diagnosis Date -  Diagnosis Status -  Navigator Follow Up Date: -  Navigator Follow Up Reason: -  Navigator Location [facility]  Navigator Encounter Type Telephone  Telephone Outgoing Call/I received a call from patient's wife.  She updated me that his GI referral appt is in July and she asked if this is ok for his treatment plan.  I updated Dr. Julien Nordmann and he is ok with this plan but would like him to be on the first available list for GI.  I called patient's wife and updated. She verbalized understanding.   Patient Visit Type -  Treatment Phase Pre-Tx/Tx Discussion  Barriers/Navigation Needs Education  Education Other  Interventions Education  Acuity Level 2-Minimal Needs (1-2 Barriers Identified)  Coordination of Care -  Education Method Verbal  Time Spent with Patient 15

## 2019-11-15 NOTE — Telephone Encounter (Signed)
DOD 11/13/19  Dr. Tarri Glenn, according to referral from oncology: patient recently was diagnosed with Stage III Lung Cancer. Staging PET scan noted subcarinal lymph node vs mass in the esophagus. Important to differentiate due to staging and treatment decisions for his cancer. Referral for consideration of endoscopy to evaluate the esophagus to make sure no esophageal mass. Urgent referral so we can proceed with best possible treatment for his lung cancer.  Pt's wife stated that he starts chemo 11/30/19 and requested to have an EGD before then.  Earlier available appointment now is mid June with an APP.  Please advise.

## 2019-11-15 NOTE — Telephone Encounter (Signed)
He needs an office visit prior to scheduling. When is the next available appointment with any provider?

## 2019-11-15 NOTE — Telephone Encounter (Signed)
First available appointment is currently 12/04/19 at 10:30 am with Dr. Hilarie Fredrickson.

## 2019-11-16 ENCOUNTER — Ambulatory Visit (INDEPENDENT_AMBULATORY_CARE_PROVIDER_SITE_OTHER): Payer: Medicare Other | Admitting: Physician Assistant

## 2019-11-16 ENCOUNTER — Encounter: Payer: Self-pay | Admitting: Gastroenterology

## 2019-11-16 ENCOUNTER — Encounter: Payer: Self-pay | Admitting: Physician Assistant

## 2019-11-16 ENCOUNTER — Telehealth: Payer: Self-pay

## 2019-11-16 VITALS — BP 120/68 | HR 60 | Ht 73.5 in | Wt 168.1 lb

## 2019-11-16 DIAGNOSIS — Z7901 Long term (current) use of anticoagulants: Secondary | ICD-10-CM | POA: Diagnosis not present

## 2019-11-16 DIAGNOSIS — R948 Abnormal results of function studies of other organs and systems: Secondary | ICD-10-CM | POA: Diagnosis not present

## 2019-11-16 NOTE — Progress Notes (Signed)
Chief Complaint: Abnormal imaging of the esophagus  HPI:    Mr. Christopher Harding is a 69 year old male with a past medical history as listed below including A. fib status post cardio conversion on Xarelto (echo 12/01/2018 with LVEF 55-60%) as well as new diagnosis of lung cancer, who was referred to me by Lujean Amel, MD for a complaint of abnormal imaging of the esophagus.    11/12/2019 PET scan with hypermetabolic right upper lobe mass and ipsilateral right hilar adenopathy, findings most consistent with primary bronchogenic carcinoma as well as hypermetabolic subcarinal lymph node versus esophageal mass and aortic atherosclerosis.    11/13/2019 patient follow-up with Dr. Earlie Server to decipher plans for chemotherapy and radiation.  He is recommended he have an EGD for further evaluation of his esophagus to better determine what therapies will work for him.    11/13/2019 CBC and CMP.  CMP with a sodium minimally decreased at 129, chloride low at 95 and otherwise widely normal.    Today, the patient presents to clinic and tells me that he is doing okay, he is just undergoing a work-up for a new lung cancer.  Tells me that since he saw Dr. Earlie Server earlier this week and was told that there could be something in his throat now he has been feeling some dysphagia and a chest tightness, but prior to this was really okay and feels like this is likely related to anxiety.  Denies any GI complaints or concerns.  Tells me he typically has a Cologuard, his last was last year negative.    Retired Chief Financial Officer.    Denies fever, chills, heartburn, reflux, epigastric or esophageal pain or blood in his stool.  Past Medical History:  Diagnosis Date  . A-fib [facility])    s/p DCCV 12/07/18  . Depression    situational  . Dysrhythmia   . Hypertension     Past Surgical History:  Procedure Laterality Date  . CARDIOVERSION N/A 12/07/2018   Procedure: CARDIOVERSION;  Surgeon: Josue Hector, MD;  Location: [facility] ENDOSCOPY;  Service:  Cardiovascular;  Laterality: N/A;  . FINE NEEDLE ASPIRATION  10/30/2019   Procedure: FINE NEEDLE ASPIRATION (FNA) LINEAR;  Surgeon: Candee Furbish, MD;  Location: [facility] ENDOSCOPY;  Service: Pulmonary;;  . INGUINAL HERNIA REPAIR Right 01/25/2019   Procedure: RIGHT INGUINAL HERNIA REPAIR WITH MESH;  Surgeon: Coralie Keens, MD;  Location: [facility];  Service: General;  Laterality: Right;  TAP BLOCK  . INSERTION OF MESH Right 01/25/2019   Procedure: Insertion Of Mesh;  Surgeon: Coralie Keens, MD;  Location: [place];  Service: General;  Laterality: Right;  Marland Kitchen VIDEO BRONCHOSCOPY WITH ENDOBRONCHIAL ULTRASOUND N/A 10/30/2019   Procedure: VIDEO BRONCHOSCOPY WITH ENDOBRONCHIAL ULTRASOUND;  Surgeon: Candee Furbish, MD;  Location: [facility] ENDOSCOPY;  Service: Pulmonary;  Laterality: N/A;    Current Outpatient Medications  Medication Sig Dispense Refill  . amlodipine-benazepril (LOTREL) 2.5-10 MG capsule Take 1 capsule by mouth daily. 90 capsule 3  . ibuprofen (ADVIL) 200 MG tablet Take 400 mg by mouth every 8 (eight) hours as needed (for pain).    . metoprolol tartrate (LOPRESSOR) 50 MG tablet Take 50 mg by mouth 2 (two) times daily with a meal.    . XARELTO 20 MG TABS tablet Take 20 mg by mouth daily in the afternoon.     . prochlorperazine (COMPAZINE) 10 MG tablet Take 1 tablet (10 mg total) by mouth every 6 (six) hours as needed. (Patient not taking: Reported on 11/16/2019) 30 tablet 2  No current facility-administered medications for this visit.    Allergies as of 11/16/2019  . (No Known Allergies)    Family History  Problem Relation Age of Onset  . Dementia Mother   . Alzheimer's disease Mother   . Hypertension Mother   . Stroke Father   . Clotting disorder Father     Social History   Socioeconomic History  . Marital status: Married    Spouse name: Not on file  . Number of children: 2  . Years of education: Not on file  . Highest education level: Not on file  Occupational History  .  Occupation: retired  Tobacco Use  . Smoking status: Current Every Day Smoker    Packs/day: 0.50    Years: 45.00    Pack years: 22.50    Types: Cigarettes  . Smokeless tobacco: Never Used  Vaping Use  . Vaping Use: Never used  Substance and Sexual Activity  . Alcohol use: Yes    Alcohol/week: 50.0 standard drinks    Types: 50 Cans of beer per week  . Drug use: No  . Sexual activity: Not on file  Other Topics Concern  . Not on file  Social History Narrative  . Not on file   Social Determinants of Health   Financial Resource Strain:   . Difficulty of Paying Living Expenses:   Food Insecurity:   . Worried About Charity fundraiser in the Last Year:   . Arboriculturist in the Last Year:   Transportation Needs:   . Film/video editor (Medical):   Marland Kitchen Lack of Transportation (Non-Medical):   Physical Activity:   . Days of Exercise per Week:   . Minutes of Exercise per Session:   Stress:   . Feeling of Stress :   Social Connections:   . Frequency of Communication with Friends and Family:   . Frequency of Social Gatherings with Friends and Family:   . Attends Religious Services:   . Active Member of Clubs or Organizations:   . Attends Archivist Meetings:   Marland Kitchen Marital Status:   Intimate Partner Violence:   . Fear of Current or Ex-Partner:   . Emotionally Abused:   Marland Kitchen Physically Abused:   . Sexually Abused:     Review of Systems:    Constitutional: No weight loss, fever or chills Skin: No rash  Cardiovascular: No chest pain Respiratory: No SOB  Gastrointestinal: See HPI and otherwise negative Genitourinary: No dysuria  Neurological: No headache, dizziness or syncope Musculoskeletal: No new muscle or joint pain Hematologic: No bleeding  Psychiatric: No history of depression or anxiety   Physical Exam:  Vital signs: BP 120/68 (BP Location: Left Arm, Patient Position: Sitting, Cuff Size: Normal)   Pulse 60   Ht 6' 1.5" (1.867 m) Comment: height measured  without shoes  Wt 168 lb 2 oz (76.3 kg)   BMI 21.88 kg/m   Constitutional:   Pleasant Caucasian male appears to be in NAD, Well developed, Well nourished, alert and cooperative Head:  Normocephalic and atraumatic. Eyes:   PEERL, EOMI. No icterus. Conjunctiva pink. Ears:  Normal auditory acuity. Neck:  Supple Throat: Oral cavity and pharynx without inflammation, swelling or lesion.  Respiratory: Respirations even and unlabored. Lungs clear to auscultation bilaterally.   No wheezes, crackles, or rhonchi.  Cardiovascular: Normal S1, S2. No MRG. Regular rate and rhythm. No peripheral edema, cyanosis or pallor.  Gastrointestinal:  Soft, nondistended, nontender. No rebound or guarding. Normal bowel  sounds. No appreciable masses or hepatomegaly. Rectal:  Not performed.  Msk:  Symmetrical without gross deformities. Without edema, no deformity or joint abnormality.  Neurologic:  Alert and  oriented x4;  grossly normal neurologically.  Skin:   Dry and intact without significant lesions or rashes. Psychiatric: Demonstrates good judgement and reason without abnormal affect or behaviors.  RELEVANT LABS AND IMAGING: CBC    Component Value Date/Time   WBC 7.1 11/13/2019 1250   WBC 5.6 10/30/2019 0558   RBC 4.07 (L) 11/13/2019 1250   HGB 13.8 11/13/2019 1250   HGB 15.2 11/24/2018 1520   HCT 38.3 (L) 11/13/2019 1250   HCT 43.5 11/24/2018 1520   PLT 237 11/13/2019 1250   PLT 221 11/24/2018 1520   MCV 94.1 11/13/2019 1250   MCV 102 (H) 11/24/2018 1520   MCH 33.9 11/13/2019 1250   MCHC 36.0 11/13/2019 1250   RDW 12.7 11/13/2019 1250   RDW 12.6 11/24/2018 1520   LYMPHSABS 0.9 11/13/2019 1250   LYMPHSABS 1.5 01/25/2018 1636   MONOABS 0.8 11/13/2019 1250   EOSABS 0.1 11/13/2019 1250   EOSABS 0.0 01/25/2018 1636   BASOSABS 0.0 11/13/2019 1250   BASOSABS 0.0 01/25/2018 1636    CMP     Component Value Date/Time   NA 129 (L) 11/13/2019 1250   NA 129 (L) 11/24/2018 1520   K 4.7 11/13/2019  1250   CL 95 (L) 11/13/2019 1250   CO2 23 11/13/2019 1250   GLUCOSE 104 (H) 11/13/2019 1250   BUN 6 (L) 11/13/2019 1250   BUN 8 11/24/2018 1520   CREATININE 0.79 11/13/2019 1250   CALCIUM 9.4 11/13/2019 1250   PROT 7.5 11/13/2019 1250   PROT 7.2 02/15/2017 1639   ALBUMIN 3.8 11/13/2019 1250   ALBUMIN 4.4 10/03/2017 1650   AST 22 11/13/2019 1250   ALT 11 11/13/2019 1250   ALKPHOS 52 11/13/2019 1250   BILITOT 0.5 11/13/2019 1250   GFRNONAA >60 11/13/2019 1250   GFRAA >60 11/13/2019 1250    Assessment: 1.  Abnormal PET scan of the esophagus: Question of esophageal mass in a patient with new diagnosis of lung cancer  Plan: 1.  Scheduled patient for an EGD.  This was requested to be done before June 21 when the patient is due to start his chemo/radiation regimen in case it changes treatment options.  This was scheduled with Dr. Tarri Glenn as she has an opening on Friday, 11/23/2019 allowing [place] time to get clearance for patient's Xarelto between now and then, she is also aware of this patient. 2.  Patient was advised to hold his Xarelto for 2 days prior to time of procedure.  We will communicate with his prescribing physician to ensure this is acceptable for him. 3.  Patient to follow in clinic per recommendations after EGD above.  Ellouise Newer, PA-C [facility] 11/16/2019, 3:04 PM  Cc: Lujean Amel, MD

## 2019-11-16 NOTE — Telephone Encounter (Signed)
[facility] Pre-operative Risk Assessment     Request for surgical clearance:     Endoscopy Procedure  What type of surgery is being performed?     Endoscopy   When is this surgery scheduled?     11/23/19  What type of clearance is required ?   Pharmacy  Are there any medications that need to be held prior to surgery and how long? Xarelto starting 2 days prior  Practice name and name of physician performing surgery?      [facility]  What is your office phone and fax number?      Phone- [phone]  [phone]  Anesthesia type (None, local, MAC, general) ?       MAC

## 2019-11-16 NOTE — Telephone Encounter (Signed)
Patient with diagnosis of afib on Xarelto for anticoagulation.    Procedure: endoscopy Date of procedure: 11/23/19  CHADS2-VASc score of 2 (age, HTN)  CrCl 72mL/min Platelet count 237K  Per office protocol, patient can hold Xarelto for 2 days prior to procedure as requested.

## 2019-11-16 NOTE — Telephone Encounter (Signed)
° °  Primary Cardiologist: Quay Burow, MD  Chart reviewed as part of pre-operative protocol coverage. Given past medical history and time since last visit, based on ACC/AHA guidelines, Christopher Harding would be at acceptable risk for the planned procedure without further cardiovascular testing.   Patient with diagnosis of afib on Xarelto for anticoagulation.    Procedure: endoscopy Date of procedure: 11/23/19  CHADS2-VASc score of 2 (age, HTN)  CrCl 27mL/min Platelet count 237K  Per office protocol, patient can hold Xarelto for 2 days prior to procedure as requested.  I will route this recommendation to the requesting party via Epic fax function and remove from pre-op pool.  Please call with questions.  Jossie Ng. Camie Hauss NP-C    11/16/2019, [facility] [place] [address] Office [phone] Fax [phone]

## 2019-11-16 NOTE — Telephone Encounter (Signed)
This patient is scheduled at 3:00 pm to see Christopher Harding.  Thank you for helping get the patient in!

## 2019-11-16 NOTE — Telephone Encounter (Signed)
Many thanks for your help.

## 2019-11-16 NOTE — Patient Instructions (Signed)
If you are age 69 or older, your body mass index should be between 23-30. Your Body mass index is 21.88 kg/m. If this is out of the aforementioned range listed, please consider follow up with your Primary Care Provider.  If you are age 22 or younger, your body mass index should be between 19-25. Your Body mass index is 21.88 kg/m. If this is out of the aformentioned range listed, please consider follow up with your Primary Care Provider.   You have been scheduled for an endoscopy. Please follow written instructions given to you at your visit today. If you use inhalers (even only as needed), please bring them with you on the day of your procedure.  You will be contacted by our office prior to your procedure for directions on holding your Xarelto.  If you do not hear from our office 1 week prior to your scheduled procedure, please call [phone] to discuss.   Follow up pending the results of your Endoscopy.

## 2019-11-16 NOTE — Telephone Encounter (Signed)
I would love to be of assistance but I do not have a 1:30 time slot free today.    Sorry, Ellouise Newer, PA-C

## 2019-11-16 NOTE — Telephone Encounter (Signed)
Do any providers today have same-day cancels to be able to see this patient? Thank you.

## 2019-11-19 ENCOUNTER — Encounter: Payer: Self-pay | Admitting: Internal Medicine

## 2019-11-19 ENCOUNTER — Other Ambulatory Visit (HOSPITAL_COMMUNITY): Payer: Medicare Other

## 2019-11-19 ENCOUNTER — Other Ambulatory Visit: Payer: Self-pay

## 2019-11-19 ENCOUNTER — Inpatient Hospital Stay: Payer: Medicare Other

## 2019-11-19 NOTE — Progress Notes (Signed)
Met with patient at registration to introduce myself as Financial Resource Specialist and to offer available resources.  Discussed one-time $1000 [employer] grant and qualifications to assist with personal expenses while going through treatment.  Gave him my card if interested in applying and for any additional financial questions or concerns.  

## 2019-11-19 NOTE — Telephone Encounter (Signed)
Notified patient's wife that he needs to hold his Xarelto on Wednesday and Thursday. I advised that he will restart Friday afternoon after his procedure.

## 2019-11-20 NOTE — Progress Notes (Signed)
Reviewed and agree with management plans. ? ?Jemya Depierro L. Archie Atilano, MD, MPH  ?

## 2019-11-21 ENCOUNTER — Encounter (HOSPITAL_COMMUNITY): Payer: Medicare Other

## 2019-11-21 NOTE — Progress Notes (Unsigned)
Pharmacist Chemotherapy Monitoring - Initial Assessment    Anticipated start date: 11/26/2019   Regimen:  . Are orders appropriate based on the patient's diagnosis, regimen, and cycle? Yes . Does the plan date match the patient's scheduled date? Yes . Is the sequencing of drugs appropriate? Yes . Are the premedications appropriate for the patient's regimen? Yes . Prior Authorization for treatment is: Not Started o If applicable, is the correct biosimilar selected based on the patient's insurance? not applicable  Organ Function and Labs: Marland Kitchen Are dose adjustments needed based on the patient's renal function, hepatic function, or hematologic function? Yes . Are appropriate labs ordered prior to the start of patient's treatment? {yes/no:20286} . Other organ system assessment, if indicated: {Rx chemo organ:23687} . The following baseline labs, if indicated, have been ordered: {Rx chemo other labs:23685}  Dose Assessment: . Are the drug doses appropriate? {yes/no:20286} . Are the following correct: o Drug concentrations {yes/no:20286} o IV fluid compatible with drug {yes/no:20286} o Administration routes {yes/no:20286} o Timing of therapy {yes/no:20286} . If applicable, does the patient have documented access for treatment and/or plans for port-a-cath placement? {YES/NO/NOT APPLICABLE:20182} . If applicable, have lifetime cumulative doses been properly documented and assessed? {YES/NO/NOT APPLICABLE:20182} Lifetime Dose Tracking  No doses have been documented on this patient for the following tracked chemicals: Doxorubicin, Epirubicin, Idarubicin, Daunorubicin, Mitoxantrone, Bleomycin, Oxaliplatin, Carboplatin, Liposomal Doxorubicin  o   Toxicity Monitoring/Prevention: . The patient has the following take home antiemetics prescribed: {Rx chemo antiemetics:23689} . The patient has the following take home medications prescribed: {Rx chemo toxicity:23686} . Medication allergies and previous  infusion related reactions, if applicable, have been reviewed and addressed. {yes/no:20286} . The patient's current medication list has been assessed for drug-drug interactions with their chemotherapy regimen. {No/  **:31982:o} significant drug-drug interactions were identified on review.  Order Review: . Are the treatment plan orders signed? {yes/no:20286} . Is the patient scheduled to see a provider prior to their treatment? {yes/no:20286}  I verify that I have reviewed each item in the above checklist and answered each question accordingly.  Rosalina Dingwall D 11/21/2019 3:33 PM

## 2019-11-21 NOTE — Progress Notes (Signed)
Thoracic Location of Tumor / Histology: Non-small cell lung cancer- adenocarcinoma, RUL  Patient presented for routine cancer screening.  PET 11/12/2019: 5.4 x 5.6 cm right upper lobe lung mass with an SUV of 17.6.  The patient also had right hilar hypermetabolism with an SUV of 9.8.  There was also focal hypermetabolism associate with the mid-esophagus versus a subcarinal lymph node with an SUV max of 21.  MRI Brain 11/10/2019: Negative for metastatic disease.  Bronchoscopy 10/30/2019: Non-small cell lung cancer- adenocarcinoma.  CT Chest 10/01/2019: 48.6 mm posterior right upper lobe lung mass with direct extension across the major fissure into the superior segment of the right lower lobe.  Biopsies of RUL Lung 10/30/2019   Tobacco/Marijuana/Snuff/ETOH use: Current smoker, he had been cutting back.  Past/Anticipated interventions by cardiothoracic surgery, if any:   Past/Anticipated interventions by medical oncology, if any:  PA Heilingoetter 11/13/2019 - Given the suspicious subcarinal lymphadenopathy, that would make the patient a stage IIIb, in which case, surgery would not be an option.  -Dr. Julien Nordmann recommends that patient undergo concurrent chemoradiation with carboplatin for an AUC of 2 and paclitaxel 45 mgm2 for 6-7 weeks .  This would be followed by consolidation immunotherapy if the patient has no evidence for disease progression after the induction phase.  The patient is interested in this option. - Dr. Julien Nordmann recommends endoscopic evaluation of the esophagus to further evaluate the hypermetabolism which was noted on the staging PET scan. -We will start his treatment on 11/26/2019 to allow time for the patient to have a consultation with gastroenterology as well as radiation oncology.   Signs/Symptoms  Weight changes, if any: No  Respiratory complaints, if any: SOB  Hemoptysis, if any: No  Pain issues, if any:  Has some chest pain/pressure in the morning when he wakes  up.  SAFETY ISSUES:  Prior radiation? No  Pacemaker/ICD? No  Possible current pregnancy? n/a  Is the patient on methotrexate? No  Current Complaints / other details:

## 2019-11-22 ENCOUNTER — Other Ambulatory Visit: Payer: Self-pay

## 2019-11-22 ENCOUNTER — Ambulatory Visit: Payer: Medicare Other | Admitting: Radiation Oncology

## 2019-11-22 ENCOUNTER — Ambulatory Visit
Admission: RE | Admit: 2019-11-22 | Discharge: 2019-11-22 | Disposition: A | Discharge status: Home / Self Care | Payer: Medicare Other | Source: Ambulatory Visit | Attending: Radiation Oncology | Admitting: Radiation Oncology

## 2019-11-22 ENCOUNTER — Encounter: Payer: Self-pay | Admitting: Radiation Oncology

## 2019-11-22 VITALS — BP 131/73 | HR 57 | Temp 97.3°F | Resp 18 | Ht 73.5 in | Wt 168.1 lb

## 2019-11-22 VITALS — BP 131/73 | HR 58 | Temp 97.3°F | Resp 18 | Ht 73.5 in | Wt 168.2 lb

## 2019-11-22 DIAGNOSIS — C3411 Malignant neoplasm of upper lobe, right bronchus or lung: Secondary | ICD-10-CM | POA: Insufficient documentation

## 2019-11-22 DIAGNOSIS — Z510 Encounter for antineoplastic radiation therapy: Secondary | ICD-10-CM | POA: Insufficient documentation

## 2019-11-22 DIAGNOSIS — C3491 Malignant neoplasm of unspecified part of right bronchus or lung: Secondary | ICD-10-CM

## 2019-11-22 DIAGNOSIS — C3481 Malignant neoplasm of overlapping sites of right bronchus and lung: Secondary | ICD-10-CM | POA: Diagnosis not present

## 2019-11-22 DIAGNOSIS — F17210 Nicotine dependence, cigarettes, uncomplicated: Secondary | ICD-10-CM | POA: Diagnosis not present

## 2019-11-23 ENCOUNTER — Encounter: Payer: Self-pay | Admitting: Gastroenterology

## 2019-11-23 ENCOUNTER — Telehealth: Payer: Self-pay | Admitting: Physician Assistant

## 2019-11-23 ENCOUNTER — Encounter (HOSPITAL_COMMUNITY): Payer: Self-pay | Admitting: Internal Medicine

## 2019-11-23 ENCOUNTER — Ambulatory Visit: Payer: Medicare Other | Admitting: Gastroenterology

## 2019-11-23 VITALS — BP 143/66 | HR 54 | Temp 97.1°F | Ht 73.0 in | Wt 168.0 lb

## 2019-11-23 MED ORDER — SODIUM CHLORIDE 0.9 % IV SOLN: 500.0000 mL | INTRAVENOUS | Status: DC

## 2019-11-23 NOTE — Progress Notes (Signed)
Vs CW Because pt ate a sandwich at 1130 today, he has been rescheduled for 11:00 Monday the 21 June 21. He has been given written instructions for the procedure on that day. Pt verbalized understanding.

## 2019-11-23 NOTE — Telephone Encounter (Signed)
Pharm: Pt held [place] for endoscopy today, but it was canceled as the patient wasn't NPO. [facility] asking if it is safe for the patient to take xarelto tonight, and then hold again Sat and Sun for endoscopy Monday. Can you please weigh in?

## 2019-11-23 NOTE — Telephone Encounter (Signed)
He is at low stroke risk with CHADS-Vasc of 2. Recommend to resume Xarelto as soon as possible, but will be acceptable to take medication today and  hold for Saturday and Sunday.

## 2019-11-23 NOTE — Telephone Encounter (Signed)
Phoned pt to advise him to take Xarelto tonight and to hold on Saturday and Sunday per cardiology. Pt verbalized understanding.

## 2019-11-26 ENCOUNTER — Encounter: Payer: Medicare Other | Admitting: Gastroenterology

## 2019-11-26 ENCOUNTER — Ambulatory Visit: Payer: Medicare Other | Admitting: Radiation Oncology

## 2019-11-26 ENCOUNTER — Inpatient Hospital Stay (HOSPITAL_BASED_OUTPATIENT_CLINIC_OR_DEPARTMENT_OTHER): Payer: Medicare Other | Admitting: Physician Assistant

## 2019-11-26 ENCOUNTER — Telehealth: Payer: Self-pay | Admitting: *Deleted

## 2019-11-26 ENCOUNTER — Other Ambulatory Visit: Payer: Medicare Other

## 2019-11-26 ENCOUNTER — Telehealth: Payer: Self-pay | Admitting: Physician Assistant

## 2019-11-26 ENCOUNTER — Inpatient Hospital Stay: Payer: Medicare Other

## 2019-11-26 ENCOUNTER — Other Ambulatory Visit: Payer: Self-pay

## 2019-11-26 ENCOUNTER — Ambulatory Visit: Payer: Medicare Other

## 2019-11-26 VITALS — BP 130/67 | HR 72 | Temp 97.8°F | Resp 20 | Ht 73.0 in | Wt 168.8 lb

## 2019-11-26 VITALS — BP 157/78 | HR 51 | Temp 97.6°F | Resp 18

## 2019-11-26 DIAGNOSIS — C3491 Malignant neoplasm of unspecified part of right bronchus or lung: Secondary | ICD-10-CM

## 2019-11-26 DIAGNOSIS — Z5111 Encounter for antineoplastic chemotherapy: Secondary | ICD-10-CM

## 2019-11-26 DIAGNOSIS — Z7901 Long term (current) use of anticoagulants: Secondary | ICD-10-CM | POA: Diagnosis not present

## 2019-11-26 DIAGNOSIS — C3431 Malignant neoplasm of lower lobe, right bronchus or lung: Secondary | ICD-10-CM | POA: Diagnosis not present

## 2019-11-26 DIAGNOSIS — C3411 Malignant neoplasm of upper lobe, right bronchus or lung: Secondary | ICD-10-CM | POA: Diagnosis not present

## 2019-11-26 DIAGNOSIS — I10 Essential (primary) hypertension: Secondary | ICD-10-CM | POA: Diagnosis not present

## 2019-11-26 DIAGNOSIS — C3481 Malignant neoplasm of overlapping sites of right bronchus and lung: Secondary | ICD-10-CM | POA: Diagnosis not present

## 2019-11-26 DIAGNOSIS — F17210 Nicotine dependence, cigarettes, uncomplicated: Secondary | ICD-10-CM | POA: Diagnosis not present

## 2019-11-26 DIAGNOSIS — I4891 Unspecified atrial fibrillation: Secondary | ICD-10-CM | POA: Diagnosis not present

## 2019-11-26 DIAGNOSIS — Z79899 Other long term (current) drug therapy: Secondary | ICD-10-CM | POA: Diagnosis not present

## 2019-11-26 DIAGNOSIS — Z510 Encounter for antineoplastic radiation therapy: Secondary | ICD-10-CM | POA: Diagnosis not present

## 2019-11-26 LAB — CBC WITH DIFFERENTIAL (CANCER CENTER ONLY)
Abs Immature Granulocytes: 0.03 10*3/uL (ref 0.00–0.07)
Basophils Absolute: 0 10*3/uL (ref 0.0–0.1)
Basophils Relative: 1 %
Eosinophils Absolute: 0 10*3/uL (ref 0.0–0.5)
Eosinophils Relative: 1 %
HCT: 37.6 % — ABNORMAL LOW (ref 39.0–52.0)
Hemoglobin: 13.6 g/dL (ref 13.0–17.0)
Immature Granulocytes: 1 %
Lymphocytes Relative: 15 %
Lymphs Abs: 0.9 10*3/uL (ref 0.7–4.0)
MCH: 34.6 pg — ABNORMAL HIGH (ref 26.0–34.0)
MCHC: 36.2 g/dL — ABNORMAL HIGH (ref 30.0–36.0)
MCV: 95.7 fL (ref 80.0–100.0)
Monocytes Absolute: 0.8 10*3/uL (ref 0.1–1.0)
Monocytes Relative: 13 %
Neutro Abs: 4.3 10*3/uL (ref 1.7–7.7)
Neutrophils Relative %: 69 %
Platelet Count: 246 10*3/uL (ref 150–400)
RBC: 3.93 MIL/uL — ABNORMAL LOW (ref 4.22–5.81)
RDW: 12.7 % (ref 11.5–15.5)
WBC Count: 6.2 10*3/uL (ref 4.0–10.5)
nRBC: 0 % (ref 0.0–0.2)

## 2019-11-26 LAB — CMP (CANCER CENTER ONLY)
ALT: 10 U/L (ref 0–44)
AST: 21 U/L (ref 15–41)
Albumin: 3.7 g/dL (ref 3.5–5.0)
Alkaline Phosphatase: 49 U/L (ref 38–126)
Anion gap: 10 (ref 5–15)
BUN: 4 mg/dL — ABNORMAL LOW (ref 8–23)
CO2: 23 mmol/L (ref 22–32)
Calcium: 9.1 mg/dL (ref 8.9–10.3)
Chloride: 96 mmol/L — ABNORMAL LOW (ref 98–111)
Creatinine: 0.73 mg/dL (ref 0.61–1.24)
GFR, Est AFR Am: >60 mL/min (ref >60)
GFR, Estimated: >60 mL/min (ref >60)
Glucose, Bld: 97 mg/dL (ref 70–99)
Potassium: 4.5 mmol/L (ref 3.5–5.1)
Sodium: 129 mmol/L — ABNORMAL LOW (ref 135–145)
Total Bilirubin: 0.5 mg/dL (ref 0.3–1.2)
Total Protein: 7.3 g/dL (ref 6.5–8.1)

## 2019-11-26 MED ORDER — SODIUM CHLORIDE 0.9 % IV SOLN
200.4000 mg | Freq: Once | INTRAVENOUS | Status: AC
  Administered 2019-11-26: 200 mg via INTRAVENOUS
  Filled 2019-11-26: qty 20

## 2019-11-26 MED ORDER — DIPHENHYDRAMINE HCL 50 MG/ML IJ SOLN
INTRAMUSCULAR | Status: AC
  Filled 2019-11-26: qty 1

## 2019-11-26 MED ORDER — PALONOSETRON HCL INJECTION 0.25 MG/5ML
0.2500 mg | Freq: Once | INTRAVENOUS | Status: AC
  Administered 2019-11-26: 0.25 mg via INTRAVENOUS

## 2019-11-26 MED ORDER — FAMOTIDINE IN NACL 20-0.9 MG/50ML-% IV SOLN
20.0000 mg | Freq: Once | INTRAVENOUS | Status: AC
  Administered 2019-11-26: 20 mg via INTRAVENOUS

## 2019-11-26 MED ORDER — SODIUM CHLORIDE 0.9 % IV SOLN
Freq: Once | INTRAVENOUS | Status: AC
  Filled 2019-11-26: qty 250

## 2019-11-26 MED ORDER — FAMOTIDINE IN NACL 20-0.9 MG/50ML-% IV SOLN
INTRAVENOUS | Status: AC
  Filled 2019-11-26: qty 50

## 2019-11-26 MED ORDER — DIPHENHYDRAMINE HCL 50 MG/ML IJ SOLN
50.0000 mg | Freq: Once | INTRAMUSCULAR | Status: AC
  Administered 2019-11-26: 50 mg via INTRAVENOUS

## 2019-11-26 MED ORDER — PALONOSETRON HCL INJECTION 0.25 MG/5ML
INTRAVENOUS | Status: AC
  Filled 2019-11-26: qty 5

## 2019-11-26 MED ORDER — SODIUM CHLORIDE 0.9 % IV SOLN
20.0000 mg | Freq: Once | INTRAVENOUS | Status: AC
  Administered 2019-11-26: 20 mg via INTRAVENOUS
  Filled 2019-11-26: qty 20

## 2019-11-26 MED ORDER — SODIUM CHLORIDE 0.9 % IV SOLN
45.0000 mg/m2 | Freq: Once | INTRAVENOUS | Status: AC
  Administered 2019-11-26: 90 mg via INTRAVENOUS
  Filled 2019-11-26: qty 15

## 2019-11-26 NOTE — Telephone Encounter (Signed)
I received vm message that Christopher Harding did not get his endo.  I spoke with Dr. Julien Nordmann and he will talk to him at his appt today.  I called Christopher Harding to update but was unable to reach. I did leave a vm message with an update.

## 2019-11-26 NOTE — Patient Instructions (Signed)
[facility] Discharge Instructions for Patients Receiving Chemotherapy  Today you received the following chemotherapy agents: Paclitaxel and Carboplatin  To help prevent nausea and vomiting after your treatment, we encourage you to take your nausea medication as directed by your MD.   If you develop nausea and vomiting that is not controlled by your nausea medication, call the clinic.   BELOW ARE SYMPTOMS THAT SHOULD BE REPORTED IMMEDIATELY:  *FEVER GREATER THAN 100.5 F  *CHILLS WITH OR WITHOUT FEVER  NAUSEA AND VOMITING THAT IS NOT CONTROLLED WITH YOUR NAUSEA MEDICATION  *UNUSUAL SHORTNESS OF BREATH  *UNUSUAL BRUISING OR BLEEDING  TENDERNESS IN MOUTH AND THROAT WITH OR WITHOUT PRESENCE OF ULCERS  *URINARY PROBLEMS  *BOWEL PROBLEMS  UNUSUAL RASH Items with * indicate a potential emergency and should be followed up as soon as possible.  Feel free to call the clinic should you have any questions or concerns. The clinic phone number is [phone].  Please show the [facility] at check-in to the Emergency Department and triage nurse.  Paclitaxel injection What is this medicine? PACLITAXEL (PAK li TAX el) is a chemotherapy drug. It targets fast dividing cells, like cancer cells, and causes these cells to die. This medicine is used to treat ovarian cancer, breast cancer, lung cancer, Kaposi's sarcoma, and other cancers. This medicine may be used for other purposes; ask your health care provider or pharmacist if you have questions. COMMON BRAND NAME(S): Onxol, Taxol What should I tell my health care provider before I take this medicine? They need to know if you have any of these conditions:  history of irregular heartbeat  liver disease  low blood counts, like low white cell, platelet, or red cell counts  lung or breathing disease, like asthma  tingling of the fingers or toes, or other nerve disorder  an unusual or allergic reaction to paclitaxel,  alcohol, polyoxyethylated castor oil, other chemotherapy, other medicines, foods, dyes, or preservatives  pregnant or trying to get pregnant  breast-feeding How should I use this medicine? This drug is given as an infusion into a vein. It is administered in a hospital or clinic by a specially trained health care professional. Talk to your pediatrician regarding the use of this medicine in children. Special care may be needed. Overdosage: If you think you have taken too much of this medicine contact a poison control center or emergency room at once. NOTE: This medicine is only for you. Do not share this medicine with others. What if I miss a dose? It is important not to miss your dose. Call your doctor or health care professional if you are unable to keep an appointment. What may interact with this medicine? Do not take this medicine with any of the following medications:  disulfiram  metronidazole This medicine may also interact with the following medications:  antiviral medicines for hepatitis, HIV or AIDS  certain antibiotics like erythromycin and clarithromycin  certain medicines for fungal infections like ketoconazole and itraconazole  certain medicines for seizures like carbamazepine, phenobarbital, phenytoin  gemfibrozil  nefazodone  rifampin  St. John's wort This list may not describe all possible interactions. Give your health care provider a list of all the medicines, herbs, non-prescription drugs, or dietary supplements you use. Also tell them if you smoke, drink alcohol, or use illegal drugs. Some items may interact with your medicine. What should I watch for while using this medicine? Your condition will be monitored carefully while you are receiving this medicine. You  will need important blood work done while you are taking this medicine. This medicine can cause serious allergic reactions. To reduce your risk you will need to take other medicine(s) before treatment  with this medicine. If you experience allergic reactions like skin rash, itching or hives, swelling of the face, lips, or tongue, tell your doctor or health care professional right away. In some cases, you may be given additional medicines to help with side effects. Follow all directions for their use. This drug may make you feel generally unwell. This is not uncommon, as chemotherapy can affect healthy cells as well as cancer cells. Report any side effects. Continue your course of treatment even though you feel ill unless your doctor tells you to stop. Call your doctor or health care professional for advice if you get a fever, chills or sore throat, or other symptoms of a cold or flu. Do not treat yourself. This drug decreases your body's ability to fight infections. Try to avoid being around people who are sick. This medicine may increase your risk to bruise or bleed. Call your doctor or health care professional if you notice any unusual bleeding. Be careful brushing and flossing your teeth or using a toothpick because you may get an infection or bleed more easily. If you have any dental work done, tell your dentist you are receiving this medicine. Avoid taking products that contain aspirin, acetaminophen, ibuprofen, naproxen, or ketoprofen unless instructed by your doctor. These medicines may hide a fever. Do not become pregnant while taking this medicine. Women should inform their doctor if they wish to become pregnant or think they might be pregnant. There is a potential for serious side effects to an unborn child. Talk to your health care professional or pharmacist for more information. Do not breast-feed an infant while taking this medicine. Men are advised not to father a child while receiving this medicine. This product may contain alcohol. Ask your pharmacist or healthcare provider if this medicine contains alcohol. Be sure to tell all healthcare providers you are taking this medicine. Certain  medicines, like metronidazole and disulfiram, can cause an unpleasant reaction when taken with alcohol. The reaction includes flushing, headache, nausea, vomiting, sweating, and increased thirst. The reaction can last from 30 minutes to several hours. What side effects may I notice from receiving this medicine? Side effects that you should report to your doctor or health care professional as soon as possible:  allergic reactions like skin rash, itching or hives, swelling of the face, lips, or tongue  breathing problems  changes in vision  fast, irregular heartbeat  high or low blood pressure  mouth sores  pain, tingling, numbness in the hands or feet  signs of decreased platelets or bleeding - bruising, pinpoint red spots on the skin, black, tarry stools, blood in the urine  signs of decreased red blood cells - unusually weak or tired, feeling faint or lightheaded, falls  signs of infection - fever or chills, cough, sore throat, pain or difficulty passing urine  signs and symptoms of liver injury like dark yellow or brown urine; general ill feeling or flu-like symptoms; light-colored stools; loss of appetite; nausea; right upper belly pain; unusually weak or tired; yellowing of the eyes or skin  swelling of the ankles, feet, hands  unusually slow heartbeat Side effects that usually do not require medical attention (report to your doctor or health care professional if they continue or are bothersome):  diarrhea  hair loss  loss of appetite  muscle or joint pain  nausea, vomiting  pain, redness, or irritation at site where injected  tiredness This list may not describe all possible side effects. Call your doctor for medical advice about side effects. You may report side effects to FDA at 1-800-FDA-1088. Where should I keep my medicine? This drug is given in a hospital or clinic and will not be stored at home. NOTE: This sheet is a summary. It may not cover all possible  information. If you have questions about this medicine, talk to your doctor, pharmacist, or health care provider.  2020 [employer] Standard (2017-01-25 13:14:55)   Carboplatin injection What is this medicine? CARBOPLATIN (KAR boe pla tin) is a chemotherapy drug. It targets fast dividing cells, like cancer cells, and causes these cells to die. This medicine is used to treat ovarian cancer and many other cancers. This medicine may be used for other purposes; ask your health care provider or pharmacist if you have questions. COMMON BRAND NAME(S): Paraplatin What should I tell my health care provider before I take this medicine? They need to know if you have any of these conditions:  blood disorders  hearing problems  kidney disease  recent or ongoing radiation therapy  an unusual or allergic reaction to carboplatin, cisplatin, other chemotherapy, other medicines, foods, dyes, or preservatives  pregnant or trying to get pregnant  breast-feeding How should I use this medicine? This drug is usually given as an infusion into a vein. It is administered in a hospital or clinic by a specially trained health care professional. Talk to your pediatrician regarding the use of this medicine in children. Special care may be needed. Overdosage: If you think you have taken too much of this medicine contact a poison control center or emergency room at once. NOTE: This medicine is only for you. Do not share this medicine with others. What if I miss a dose? It is important not to miss a dose. Call your doctor or health care professional if you are unable to keep an appointment. What may interact with this medicine?  medicines for seizures  medicines to increase blood counts like filgrastim, pegfilgrastim, sargramostim  some antibiotics like amikacin, gentamicin, neomycin, streptomycin, tobramycin  vaccines Talk to your doctor or health care professional before taking any of these  medicines:  acetaminophen  aspirin  ibuprofen  ketoprofen  naproxen This list may not describe all possible interactions. Give your health care provider a list of all the medicines, herbs, non-prescription drugs, or dietary supplements you use. Also tell them if you smoke, drink alcohol, or use illegal drugs. Some items may interact with your medicine. What should I watch for while using this medicine? Your condition will be monitored carefully while you are receiving this medicine. You will need important blood work done while you are taking this medicine. This drug may make you feel generally unwell. This is not uncommon, as chemotherapy can affect healthy cells as well as cancer cells. Report any side effects. Continue your course of treatment even though you feel ill unless your doctor tells you to stop. In some cases, you may be given additional medicines to help with side effects. Follow all directions for their use. Call your doctor or health care professional for advice if you get a fever, chills or sore throat, or other symptoms of a cold or flu. Do not treat yourself. This drug decreases your body's ability to fight infections. Try to avoid being around people who are sick. This medicine may increase  your risk to bruise or bleed. Call your doctor or health care professional if you notice any unusual bleeding. Be careful brushing and flossing your teeth or using a toothpick because you may get an infection or bleed more easily. If you have any dental work done, tell your dentist you are receiving this medicine. Avoid taking products that contain aspirin, acetaminophen, ibuprofen, naproxen, or ketoprofen unless instructed by your doctor. These medicines may hide a fever. Do not become pregnant while taking this medicine. Women should inform their doctor if they wish to become pregnant or think they might be pregnant. There is a potential for serious side effects to an unborn child. Talk  to your health care professional or pharmacist for more information. Do not breast-feed an infant while taking this medicine. What side effects may I notice from receiving this medicine? Side effects that you should report to your doctor or health care professional as soon as possible:  allergic reactions like skin rash, itching or hives, swelling of the face, lips, or tongue  signs of infection - fever or chills, cough, sore throat, pain or difficulty passing urine  signs of decreased platelets or bleeding - bruising, pinpoint red spots on the skin, black, tarry stools, nosebleeds  signs of decreased red blood cells - unusually weak or tired, fainting spells, lightheadedness  breathing problems  changes in hearing  changes in vision  chest pain  high blood pressure  low blood counts - This drug may decrease the number of white blood cells, red blood cells and platelets. You may be at increased risk for infections and bleeding.  nausea and vomiting  pain, swelling, redness or irritation at the injection site  pain, tingling, numbness in the hands or feet  problems with balance, talking, walking  trouble passing urine or change in the amount of urine Side effects that usually do not require medical attention (report to your doctor or health care professional if they continue or are bothersome):  hair loss  loss of appetite  metallic taste in the mouth or changes in taste This list may not describe all possible side effects. Call your doctor for medical advice about side effects. You may report side effects to FDA at 1-800-FDA-1088. Where should I keep my medicine? This drug is given in a hospital or clinic and will not be stored at home. NOTE: This sheet is a summary. It may not cover all possible information. If you have questions about this medicine, talk to your doctor, pharmacist, or health care provider.  2020 [employer]/Gold Standard (2007-08-29 14:38:05)

## 2019-11-26 NOTE — Telephone Encounter (Signed)
Per Epic, it appears that the patient's endoscopy was rescheduled for today at the same time has his chemotherapy. I left a message detailing that I would prefer if the patient get his endoscopy as planned today and that I am planning on trying to reschedule his treatment tomorrow. I instructed them to call us back if they had any questions.

## 2019-11-26 NOTE — Progress Notes (Signed)
[facility] OFFICE PROGRESS NOTE  Koirala, Dibas, MD [address] [place] [address]  DIAGNOSIS: stage III (T3, N1/N2, M0) non-small cell lung cancer, adenocarcinoma.  He presented with a right upper lobe lung mass with direct extension across the major fissure into the superior segment of the right lower lobe and ipsilateral hilar lymphadenopathy.  There is questionable subcarinal lymphadenopathy versus esophageal hypermetabolism.  The patient was diagnosed in May 2021.  Molecular studies pending  PDL1: 70%  PRIOR THERAPY: None  CURRENT THERAPY: Weekly concurrent chemoradiation with carboplatin for an AUC of 2 and paclitaxel 45 mg/m.  First dose expected on 11/26/2019.  INTERVAL HISTORY: Christopher Harding 69 y.o. male returns to the clinic today for follow-up visit.  The patient is feeling fairly well today without any concerning complaints.  The patient was recently diagnosed with stage III non-small cell lung cancer, adenocarcinoma.  The patient's staging PET scan noted focal hypermetabolism associated with the midesophagus versus a subcarinal lymph node.  The patient was referred to gastroenterology for consideration of endoscopy to further evaluate this.  The patient was scheduled for an endoscopy on Friday, 11/23/2019; however, this was canceled due to the patient not being NPO.  The patient was rescheduled for this procedure for today; however, the patient canceled his appointment due to it overlapping with his chemotherapy appointment.  The patient denies any new concerning complaints today.  He denies any fever, chills, night sweats, or weight loss.  He denies any chest pain, cough, or hemoptysis.  He reports mild baseline shortness of breath.  He denies any dysphagia or odynophagia.  He denies any nausea, vomiting, diarrhea, or constipation.  He denies any headache or visual changes.  He is scheduled for his first radiation treatment on 11/28/2019.  He is  here today for evaluation before starting cycle #1.  MEDICAL HISTORY: Past Medical History:  Diagnosis Date  . A-fib [facility])    s/p DCCV 12/07/18  . Depression    situational  . Dysrhythmia   . Hypertension     ALLERGIES:  has No Known Allergies.  MEDICATIONS:  Current Outpatient Medications  Medication Sig Dispense Refill  . amlodipine-benazepril (LOTREL) 2.5-10 MG capsule Take 1 capsule by mouth daily. 90 capsule 3  . metoprolol tartrate (LOPRESSOR) 50 MG tablet Take 50 mg by mouth 2 (two) times daily with a meal.    . XARELTO 20 MG TABS tablet Take 20 mg by mouth daily in the afternoon.     Marland Kitchen ibuprofen (ADVIL) 200 MG tablet Take 400 mg by mouth every 8 (eight) hours as needed (for pain). (Patient not taking: Reported on 11/26/2019)    . prochlorperazine (COMPAZINE) 10 MG tablet Take 1 tablet (10 mg total) by mouth every 6 (six) hours as needed. (Patient not taking: Reported on 11/16/2019) 30 tablet 2   Current Facility-Administered Medications  Medication Dose Route Frequency Provider Last Rate Last Admin  . 0.9 %  sodium chloride infusion  500 mL Intravenous Continuous [place], MD        SURGICAL HISTORY:  Past Surgical History:  Procedure Laterality Date  . CARDIOVERSION N/A 12/07/2018   Procedure: CARDIOVERSION;  Surgeon: Josue Hector, MD;  Location: [facility] ENDOSCOPY;  Service: Cardiovascular;  Laterality: N/A;  . FINE NEEDLE ASPIRATION  10/30/2019   Procedure: FINE NEEDLE ASPIRATION (FNA) LINEAR;  Surgeon: Candee Furbish, MD;  Location: [facility] ENDOSCOPY;  Service: Pulmonary;;  . INGUINAL HERNIA REPAIR Right 01/25/2019   Procedure: RIGHT  INGUINAL HERNIA REPAIR WITH MESH;  Surgeon: Coralie Keens, MD;  Location: [place];  Service: General;  Laterality: Right;  TAP BLOCK  . INSERTION OF MESH Right 01/25/2019   Procedure: Insertion Of Mesh;  Surgeon: Coralie Keens, MD;  Location: [place];  Service: General;  Laterality: Right;  Marland Kitchen VIDEO BRONCHOSCOPY WITH ENDOBRONCHIAL  ULTRASOUND N/A 10/30/2019   Procedure: VIDEO BRONCHOSCOPY WITH ENDOBRONCHIAL ULTRASOUND;  Surgeon: Candee Furbish, MD;  Location: [facility] ENDOSCOPY;  Service: Pulmonary;  Laterality: N/A;    REVIEW OF SYSTEMS:   Review of Systems  Constitutional: Negative for appetite change, chills, fatigue, fever and unexpected weight change.  HENT: Negative for mouth sores, nosebleeds, sore throat and trouble swallowing.   Eyes: Negative for eye problems and icterus.  Respiratory: Positive for mild shortness of breath. Negative for cough, hemoptysis, and wheezing.   Cardiovascular: Negative for chest pain and leg swelling.  Gastrointestinal: Negative for abdominal pain, constipation, diarrhea, nausea and vomiting.  Genitourinary: Negative for bladder incontinence, difficulty urinating, dysuria, frequency and hematuria.   Musculoskeletal: Negative for back pain, gait problem, neck pain and neck stiffness.  Skin: Negative for itching and rash.  Neurological: Negative for dizziness, extremity weakness, gait problem, headaches, light-headedness and seizures.  Hematological: Negative for adenopathy. Does not bruise/bleed easily.  Psychiatric/Behavioral: Negative for confusion, depression and sleep disturbance. The patient is not nervous/anxious.       PHYSICAL EXAMINATION:  Blood pressure 130/67, pulse 72, temperature 97.8 F (36.6 C), temperature source Temporal, resp. rate 20, height '6\' 1"'$  (1.854 m), weight 168 lb 12.8 oz (76.6 kg), SpO2 98 %.  ECOG PERFORMANCE STATUS: 0 - Asymptomatic  Physical Exam  Constitutional: Oriented to person, place, and time and well-developed, well-nourished, and in no distress.  HENT:  Head: Normocephalic and atraumatic.  Mouth/Throat: Oropharynx is clear and moist. No oropharyngeal exudate.  Eyes: Conjunctivae are normal. Right eye exhibits no discharge. Left eye exhibits no discharge. No scleral icterus.  Neck: Normal range of motion. Neck supple.  Cardiovascular: Normal  rate, regular rhythm, normal heart sounds and intact distal pulses.   Pulmonary/Chest: Effort normal. Quiet breath sounds in all lung fields. No respiratory distress. No wheezes. No rales.  Abdominal: Soft. Bowel sounds are normal. Exhibits no distension and no mass. There is no tenderness.  Musculoskeletal: Normal range of motion. Exhibits no edema.  Lymphadenopathy:    No cervical adenopathy.  Neurological: Alert and oriented to person, place, and time. Exhibits normal muscle tone. Gait normal. Coordination normal.  Skin: Skin is warm and dry. No rash noted. Not diaphoretic. No erythema. No pallor.  Psychiatric: Mood, memory and judgment normal.  Vitals reviewed.  LABORATORY DATA: Lab Results  Component Value Date   WBC 6.2 11/26/2019   HGB 13.6 11/26/2019   HCT 37.6 (L) 11/26/2019   MCV 95.7 11/26/2019   PLT 246 11/26/2019      Chemistry      Component Value Date/Time   NA 129 (L) 11/26/2019 1129   NA 129 (L) 11/24/2018 1520   K 4.5 11/26/2019 1129   CL 96 (L) 11/26/2019 1129   CO2 23 11/26/2019 1129   BUN 4 (L) 11/26/2019 1129   BUN 8 11/24/2018 1520   CREATININE 0.73 11/26/2019 1129      Component Value Date/Time   CALCIUM 9.1 11/26/2019 1129   ALKPHOS 49 11/26/2019 1129   AST 21 11/26/2019 1129   ALT 10 11/26/2019 1129   BILITOT 0.5 11/26/2019 1129       RADIOGRAPHIC STUDIES:  MR BRAIN W WO CONTRAST  Result Date: 11/11/2019 CLINICAL DATA:  Provided history: Malignant neoplasm of unspecified part of unspecified bronchus or lung. Non-small cell lung cancer, staging. EXAM: MRI HEAD WITHOUT AND WITH CONTRAST TECHNIQUE: Multiplanar, multiecho pulse sequences of the brain and surrounding structures were obtained without and with intravenous contrast. CONTRAST:  55m GADAVIST GADOBUTROL 1 MMOL/ML IV SOLN COMPARISON:  No pertinent prior studies available for comparison. FINDINGS: Brain: Mild generalized parenchymal atrophy. Mild patchy T2/FLAIR hyperintensity within the  cerebral white matter and pons is nonspecific, but likely reflects chronic small vessel ischemic disease. Chronic lacunar infarct within the right cerebellum. There is no acute infarct. No evidence of intracranial mass. No chronic intracranial blood products. No extra-axial fluid collection. No midline shift. No evidence of intracranial metastatic disease. 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 Expected proximal arterial flow voids. Skull and upper cervical spine: No focal marrow lesion. Sinuses/Orbits: Right lens replacement. Visualized orbits show no acute finding. No significant paranasal sinus disease or mastoid effusion at the imaged levels. Impression #5 below will be called to the ordering clinician or representative by the Radiologist Assistant, and communication documented in the PACS or [employer] IMPRESSION: 1. No evidence of intracranial metastatic disease. 2. Mild chronic small vessel ischemic changes within the cerebral white matter and pons. 3. Chronic right cerebellar lacunar infarct. 4. Mild generalized parenchymal atrophy. 5. 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 CT or MR angiography is recommended for further characterization. Electronically Signed   By: KKellie SimmeringDO   On: 11/11/2019 21:16   NM PET Image Initial (PI) Skull Base To Thigh  Result Date: 11/12/2019 CLINICAL DATA:  Initial treatment strategy for lung cancer. EXAM: NUCLEAR MEDICINE PET SKULL BASE TO THIGH TECHNIQUE: 8.8 mCi F-18 FDG was injected intravenously. Full-ring PET imaging was performed from the skull base to thigh after the radiotracer. CT data was obtained and used for attenuation correction and anatomic localization. Fasting blood glucose: 77 mg/dl COMPARISON:  CT chest 10/01/2019. FINDINGS: Mediastinal blood pool activity: SUV max 2.7 Liver activity: SUV max NA NECK: No abnormal hypermetabolism. Incidental CT findings: None. CHEST: Right hilar hypermetabolism has an SUV max of 9.8 and  corresponds to an 11 mm lymph node (4/83). There is focal hypermetabolism associated with the mid esophagus versus a subcarinal lymph node, with an SUV max of 21.0. Absence of IV contrast is limiting on CT. A mass in the posterior segment right upper lobe measures 5.4 x 5.6 cm with an SUV max of 17.6. Incidental CT findings: Atherosclerotic calcification of the aorta, aortic valve and coronary arteries. Heart size normal. No pericardial effusion. Very mild postobstructive pneumonitis in the right upper lobe and superior segment right lower lobe. Calcified granuloma in the right lower lobe. ABDOMEN/PELVIS: No abnormal hypermetabolism in the liver, adrenal glands, spleen or pancreas. No hypermetabolic lymph nodes. Incidental CT findings: Liver is unremarkable. There may be sludge layering in the gallbladder. Adrenal glands, kidneys, spleen, pancreas, stomach and bowel are grossly unremarkable. Atherosclerotic calcification of the aorta without aneurysm. Slight bladder wall trabeculation. SKELETON: No abnormal hypermetabolism. Incidental CT findings: Degenerative changes in the spine. IMPRESSION: 1. Hypermetabolic right upper lobe mass and ipsilateral right hilar adenopathy, findings most consistent with primary bronchogenic carcinoma. 2. Hypermetabolic subcarinal lymph node versus esophageal mass. 3. Aortic atherosclerosis (ICD10-I70.0). Coronary artery calcification. Electronically Signed   By: MLorin PicketM.D.   On: 11/12/2019 13:03   DG CHEST PORT 1 VIEW  Result Date:  10/30/2019 CLINICAL DATA:  Status post right lung mass biopsy. EXAM: PORTABLE CHEST 1 VIEW COMPARISON:  Chest CT, 10/01/2019 FINDINGS: Mass projecting superiorly and laterally from the right hilum, measuring approximately 5.8 cm, is without change from the previous chest CT. Lungs are hyperexpanded but otherwise clear. No pleural effusion. No pneumothorax. Cardiac silhouette is normal in size. No mediastinal or left hilar masses. Skeletal  structures are grossly intact IMPRESSION: 1. No acute cardiopulmonary disease. No evidence of a complication following right lung mass biopsy. 2. Right upper lung mass extending from the superior right hilum is stable compared to the prior chest CT. Electronically Signed   By: Lajean Manes M.D.   On: 10/30/2019 09:32     ASSESSMENT/PLAN:  This is a very pleasant 69 year old Caucasian male recently diagnosed with stage III (T3, N1/N2, M0) non-small cell lung cancer, adenocarcinoma.  He presented with a right upper lobe lung mass with direct extension across the major fissure into the superior segment of the right lower lobe and ipsilateral hilar lymphadenopathy.  There is questionable subcarinal lymphadenopathy versus esophageal hypermetabolism.  The patient was diagnosed in May 2021.  Molecular studies pending. His PDL1 expression in 70%  The patient is currently undergoing weekly concurrent chemoradiation with carboplatin for an AUC of 2 and paclitaxel 45 mgm2.  His first dose is expected for today. His first dose of radiation is scheduled for 6/23  The patient was seen with Dr. Julien Nordmann. Labs were reviewed. Recommend that he proceed with cycle #1 today as scheduled.   We will see him back for a follow up visit in 2 weeks for evaluation before starting cycle #3.   We reiterated the purpose of the upper endoscopy in determining staging and the best treatment option for his condition. He was strongly encouraged to reschedule his endoscopy at the earliest availability.   The patient was advised to call immediately if he has any concerning symptoms in the interval. The patient voices understanding of current disease status and treatment options and is in agreement with the current care plan. All questions were answered. The patient knows to call the clinic with any problems, questions or concerns. We can certainly see the patient much sooner if necessary   No orders of the defined types were placed  in this encounter.    Sonya Pucci L Sinthia Karabin, PA-C 11/26/19  ADDENDUM: Hematology/Oncology Attending: I had a face-to-face encounter with the patient today.  I recommended his care plan.  This is a very pleasant 69 years old white male recently diagnosed with a stage IIIa/B non-small cell lung cancer, adenocarcinoma presented with large right upper lobe lung mass in addition to right hilar lymphadenopathy and suspicious subcarinal lymphadenopathy versus esophageal mass.  The patient was referred to gastroenterology for consideration of upper endoscopy and evaluation of the hypermetabolic activity in the distal esophagus versus subcarinal lymphadenopathy.  Unfortunately the procedure was delayed because he ate before going to the procedure.  He is expected to be scheduled in the next few days.  The patient is expected to start the course of radiotherapy in 2 days. I recommended for him to proceed with the first cycle of concurrent chemotherapy today with carboplatin and paclitaxel as planned. The patient will come back for follow-up visit in 2 weeks for reevaluation and management of any adverse effect of his treatment. Depending on the results of the upper endoscopy his care plan may change. He was advised to call immediately if he has any other concerning symptoms in the interval.

## 2019-11-26 NOTE — Telephone Encounter (Signed)
Patient was supposed to have had endoscopy done 06/18 but patient ate and was rescheduled to today 06/21.  Per Cassie patient needed to have this done prior to start of chemo therefore Cassie attempted and was successful to reach patient to let them know this.  Patient's wife canceled endoscopy stating he needed to do chemo first and they felt that the endoscopy was being rushed, however per Cassie there was urgency to get it done to ensure patient is getting correct treatment recommendations.    Attempted to reach patient again to advise to come to visit today to discuss the above.   Left message.

## 2019-11-27 ENCOUNTER — Telehealth: Payer: Self-pay | Admitting: *Deleted

## 2019-11-27 ENCOUNTER — Other Ambulatory Visit: Payer: Self-pay

## 2019-11-27 ENCOUNTER — Ambulatory Visit
Admission: RE | Admit: 2019-11-27 | Discharge: 2019-11-27 | Disposition: A | Discharge status: Home / Self Care | Payer: Medicare Other | Source: Ambulatory Visit | Attending: Radiation Oncology | Admitting: Radiation Oncology

## 2019-11-27 ENCOUNTER — Ambulatory Visit: Payer: Medicare Other

## 2019-11-27 DIAGNOSIS — C3411 Malignant neoplasm of upper lobe, right bronchus or lung: Secondary | ICD-10-CM | POA: Diagnosis not present

## 2019-11-27 DIAGNOSIS — Z510 Encounter for antineoplastic radiation therapy: Secondary | ICD-10-CM | POA: Diagnosis not present

## 2019-11-27 DIAGNOSIS — C3491 Malignant neoplasm of unspecified part of right bronchus or lung: Secondary | ICD-10-CM | POA: Diagnosis not present

## 2019-11-27 DIAGNOSIS — C3481 Malignant neoplasm of overlapping sites of right bronchus and lung: Secondary | ICD-10-CM | POA: Diagnosis not present

## 2019-11-27 NOTE — Addendum Note (Signed)
Encounter addended by: Hayden Pedro, PA-C on: 11/27/2019 6:25 AM  Actions taken: Clinical Note Signed

## 2019-11-27 NOTE — Telephone Encounter (Signed)
Spoke with [patient's wife regarding restarting his xarelto and rescheduling his EGD after late cancellation last Friday. Pt had Chemo this am and some testing. He did resume his Xarelto on Friday 6/18. Pt's wife wants to check with the oncologist regarding the best time to reschedule his EGD. Will let Dr. Tarri Glenn know.

## 2019-11-27 NOTE — Progress Notes (Addendum)
Radiation Oncology         [phone] ________________________________  Initial Outpatient Consultation - Conducted via telephone due to current COVID-19 concerns for limiting patient exposure  I spoke with the patient to conduct this consult visit via telephone to spare the patient unnecessary potential exposure in the healthcare setting during the current COVID-19 pandemic. The patient was notified in advance and was offered a [place] meeting to allow for face to face communication but unfortunately reported that they did not have the appropriate resources/technology to support such a visit and instead preferred to proceed with a telephone consult.   Name: Christopher Harding        MRN: [id number]  Date of Service: 11/22/2019 DOB: 1950-07-28  OA:CZYSAYT, Dibas, MD  Heilingoetter, Cassandr*     REFERRING PHYSICIAN: Heilingoetter, Cassandr*   DIAGNOSIS: The encounter diagnosis was Adenocarcinoma, lung, right ([place]).   HISTORY OF PRESENT ILLNESS: Christopher Harding is a 69 y.o. male seen at the request of Dr. Julien Nordmann for a new diagnosis of advanced lung cancer.  The patient h has been counseled on proceeding with low-dose CT scan for lung cancer screening, and on October 01, 2019 underwent the study.  The scan noted a 4.8 cm posterior right upper lobe mass with direct extension into the major fissure and superior segment of the right lower lobe.  He proceeded with bronchoscopy with Dr. 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  He proceeded with an MRI of the brain on 11/10/2019 that did not show evidence of metastatic disease.  A PET scan on 11/12/2019 revealed hypermetabolism in the right upper lobe mass and ipsilateral right hilar adenopathy.hypermetabolic subcarinal lymph nodes versus esophageal mass.  He was scheduled to undergo evaluation with GI endoscopy but his evaluation was postponed due to the timing of eating.  It was  rescheduled and then subsequently canceled again.  He has been encouraged by medical oncology to reschedule this.  He is contacted today to discuss treatment recommendations for his cancer.    PREVIOUS RADIATION THERAPY: No   PAST MEDICAL HISTORY:  Past Medical History:  Diagnosis Date  . A-fib [facility])    s/p DCCV 12/07/18  . Depression    situational  . Dysrhythmia   . Hypertension        PAST SURGICAL HISTORY: Past Surgical History:  Procedure Laterality Date  . CARDIOVERSION N/A 12/07/2018   Procedure: CARDIOVERSION;  Surgeon: Josue Hector, MD;  Location: [facility] ENDOSCOPY;  Service: Cardiovascular;  Laterality: N/A;  . FINE NEEDLE ASPIRATION  10/30/2019   Procedure: FINE NEEDLE ASPIRATION (FNA) LINEAR;  Surgeon: Candee Furbish, MD;  Location: [facility] ENDOSCOPY;  Service: Pulmonary;;  . INGUINAL HERNIA REPAIR Right 01/25/2019   Procedure: RIGHT INGUINAL HERNIA REPAIR WITH MESH;  Surgeon: Coralie Keens, MD;  Location: [place];  Service: General;  Laterality: Right;  TAP BLOCK  . INSERTION OF MESH Right 01/25/2019   Procedure: Insertion Of Mesh;  Surgeon: Coralie Keens, MD;  Location: [place];  Service: General;  Laterality: Right;  Marland Kitchen VIDEO BRONCHOSCOPY WITH ENDOBRONCHIAL ULTRASOUND N/A 10/30/2019   Procedure: VIDEO BRONCHOSCOPY WITH ENDOBRONCHIAL ULTRASOUND;  Surgeon: Candee Furbish, MD;  Location: [facility] ENDOSCOPY;  Service: Pulmonary;  Laterality: N/A;     FAMILY HISTORY:  Family History  Problem Relation Age of Onset  . Dementia Mother   . Alzheimer's disease Mother   . Hypertension Mother   . Stroke Father   .  Clotting disorder Father      SOCIAL HISTORY:  reports that he has been smoking cigarettes. He has a 22.50 pack-year smoking history. He has never used smokeless tobacco. He reports current alcohol use of about 50.0 standard drinks of alcohol per week. He reports that he does not use drugs.  The patient is married and lives in [place].  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   ALLERGIES: Patient has no known allergies.   MEDICATIONS:  Current Outpatient Medications  Medication Sig Dispense Refill  . amlodipine-benazepril (LOTREL) 2.5-10 MG capsule Take 1 capsule by mouth daily. 90 capsule 3  . ibuprofen (ADVIL) 200 MG tablet Take 400 mg by mouth every 8 (eight) hours as needed (for pain). (Patient not taking: Reported on 11/26/2019)    . metoprolol tartrate (LOPRESSOR) 50 MG tablet Take 50 mg by mouth 2 (two) times daily with a meal.    . XARELTO 20 MG TABS tablet Take 20 mg by mouth daily in the afternoon.     . prochlorperazine (COMPAZINE) 10 MG tablet Take 1 tablet (10 mg total) by mouth every 6 (six) hours as needed. (Patient not taking: Reported on 11/16/2019) 30 tablet 2   Current Facility-Administered Medications  Medication Dose Route Frequency Provider Last Rate Last Admin  . 0.9 %  sodium chloride infusion  500 mL Intravenous Continuous [place], MD         REVIEW OF SYSTEMS: On review of systems, the patient reports that he is doing well overall.  He really denies significant changes of his breathing, that time he has shortness of breath with exertion.  He does have some discomfort in the chest in the morning when he wakes up, this subsides.  He denies unintended weight changes or fever.     PHYSICAL EXAM:  Wt Readings from Last 3 Encounters:  11/26/19 168 lb 12.8 oz (76.6 kg)  11/23/19 168 lb (76.2 kg)  11/22/19 168 lb 3.2 oz (76.3 kg)    ECOG = 1  0 - Asymptomatic (Fully active, able to carry on all predisease activities without restriction)  1 - Symptomatic but completely ambulatory (Restricted in physically strenuous activity but ambulatory and able to carry out work of a light or sedentary nature. For example, light housework, office work)  2 - Symptomatic, <50% in bed during the day (Ambulatory and capable of all self care but unable to carry out any work activities. Up and about more than 50% of waking hours)  3 -  Symptomatic, >50% in bed, but not bedbound (Capable of only limited self-care, confined to bed or chair 50% or more of waking hours)  4 - Bedbound (Completely disabled. Cannot carry on any self-care. Totally confined to bed or chair)  5 - Death   Eustace Pen MM, Creech RH, Tormey DC, et al. [phone]). "Toxicity and response criteria of the [facility]". [place] Oncol. 5 (6): 649-55    LABORATORY DATA:  Lab Results  Component Value Date   WBC 6.2 11/26/2019   HGB 13.6 11/26/2019   HCT 37.6 (L) 11/26/2019   MCV 95.7 11/26/2019   PLT 246 11/26/2019   Lab Results  Component Value Date   NA 129 (L) 11/26/2019   K 4.5 11/26/2019   CL 96 (L) 11/26/2019   CO2 23 11/26/2019   Lab Results  Component Value Date   ALT 10 11/26/2019   AST 21 11/26/2019   ALKPHOS 49 11/26/2019   BILITOT 0.5 11/26/2019  RADIOGRAPHY: MR BRAIN W WO CONTRAST  Result Date: 11/11/2019 CLINICAL DATA:  Provided history: Malignant neoplasm of unspecified part of unspecified bronchus or lung. Non-small cell lung cancer, staging. EXAM: MRI HEAD WITHOUT AND WITH CONTRAST TECHNIQUE: Multiplanar, multiecho pulse sequences of the brain and surrounding structures were obtained without and with intravenous contrast. CONTRAST:  46mL GADAVIST GADOBUTROL 1 MMOL/ML IV SOLN COMPARISON:  No pertinent prior studies available for comparison. FINDINGS: Brain: Mild generalized parenchymal atrophy. Mild patchy T2/FLAIR hyperintensity within the cerebral white matter and pons is nonspecific, but likely reflects chronic small vessel ischemic disease. Chronic lacunar infarct within the right cerebellum. There is no acute infarct. No evidence of intracranial mass. No chronic intracranial blood products. No extra-axial fluid collection. No midline shift. No evidence of intracranial metastatic disease. 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 Expected proximal arterial flow voids. Skull and upper  cervical spine: No focal marrow lesion. Sinuses/Orbits: Right lens replacement. Visualized orbits show no acute finding. No significant paranasal sinus disease or mastoid effusion at the imaged levels. Impression #5 below will be called to the ordering clinician or representative by the Radiologist Assistant, and communication documented in the PACS or [employer]. IMPRESSION: 1. No evidence of intracranial metastatic disease. 2. Mild chronic small vessel ischemic changes within the cerebral white matter and pons. 3. Chronic right cerebellar lacunar infarct. 4. Mild generalized parenchymal atrophy. 5. 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 CT or MR angiography is recommended for further characterization. Electronically Signed   By: Kellie Simmering DO   On: 11/11/2019 21:16   NM PET Image Initial (PI) Skull Base To Thigh  Result Date: 11/12/2019 CLINICAL DATA:  Initial treatment strategy for lung cancer. EXAM: NUCLEAR MEDICINE PET SKULL BASE TO THIGH TECHNIQUE: 8.8 mCi F-18 FDG was injected intravenously. Full-ring PET imaging was performed from the skull base to thigh after the radiotracer. CT data was obtained and used for attenuation correction and anatomic localization. Fasting blood glucose: 77 mg/dl COMPARISON:  CT chest 10/01/2019. FINDINGS: Mediastinal blood pool activity: SUV max 2.7 Liver activity: SUV max NA NECK: No abnormal hypermetabolism. Incidental CT findings: None. CHEST: Right hilar hypermetabolism has an SUV max of 9.8 and corresponds to an 11 mm lymph node (4/83). There is focal hypermetabolism associated with the mid esophagus versus a subcarinal lymph node, with an SUV max of 21.0. Absence of IV contrast is limiting on CT. A mass in the posterior segment right upper lobe measures 5.4 x 5.6 cm with an SUV max of 17.6. Incidental CT findings: Atherosclerotic calcification of the aorta, aortic valve and coronary arteries. Heart size normal. No pericardial effusion. Very mild postobstructive  pneumonitis in the right upper lobe and superior segment right lower lobe. Calcified granuloma in the right lower lobe. ABDOMEN/PELVIS: No abnormal hypermetabolism in the liver, adrenal glands, spleen or pancreas. No hypermetabolic lymph nodes. Incidental CT findings: Liver is unremarkable. There may be sludge layering in the gallbladder. Adrenal glands, kidneys, spleen, pancreas, stomach and bowel are grossly unremarkable. Atherosclerotic calcification of the aorta without aneurysm. Slight bladder wall trabeculation. SKELETON: No abnormal hypermetabolism. Incidental CT findings: Degenerative changes in the spine. IMPRESSION: 1. Hypermetabolic right upper lobe mass and ipsilateral right hilar adenopathy, findings most consistent with primary bronchogenic carcinoma. 2. Hypermetabolic subcarinal lymph node versus esophageal mass. 3. Aortic atherosclerosis (ICD10-I70.0). Coronary artery calcification. Electronically Signed   By: Lorin Picket M.D.   On: 11/12/2019 13:03   DG CHEST PORT 1 VIEW  Result  Date: 10/30/2019 CLINICAL DATA:  Status post right lung mass biopsy. EXAM: PORTABLE CHEST 1 VIEW COMPARISON:  Chest CT, 10/01/2019 FINDINGS: Mass projecting superiorly and laterally from the right hilum, measuring approximately 5.8 cm, is without change from the previous chest CT. Lungs are hyperexpanded but otherwise clear. No pleural effusion. No pneumothorax. Cardiac silhouette is normal in size. No mediastinal or left hilar masses. Skeletal structures are grossly intact IMPRESSION: 1. No acute cardiopulmonary disease. No evidence of a complication following right lung mass biopsy. 2. Right upper lung mass extending from the superior right hilum is stable compared to the prior chest CT. Electronically Signed   By: Lajean Manes M.D.   On: 10/30/2019 09:32       IMPRESSION/PLAN: 1. 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 Dr. Lisbeth Renshaw discusses the pathology  findings and reviews the nature of locally advanced lung cancer.  Dr. Lisbeth Renshaw discusses the rationale to proceed with chemoradiation.  He also recommends proceeding with evaluation with EGD to better determine date along the esophagus whether that is adenopathy or separate issue.  We discussed the risks, benefits, short, and long term effects of radiotherapy, and the patient is interested in proceeding. Dr. Lisbeth Renshaw discusses the delivery and logistics of radiotherapy and anticipates a course of 6 1/2 weeks of radiotherapy.  He will proced today for simulation, we anticipate beginning his treatment 11/26/2019.  Written consent will be obtained at the time of simulation.   Given current concerns for patient exposure during the COVID-19 pandemic, this encounter was conducted via telephone.  The patient has provided two factor identification and has given verbal consent for this type of encounter and has been advised to only accept a meeting of this type in a secure network environment. The time spent during this encounter was 60 minutes including preparation, discussion, and coordination of the patient's care. The attendants for this meeting include Blenda Nicely, RN, Dr. Lisbeth Renshaw, Hayden Pedro  and [name].  During the encounter,  Blenda Nicely, RN, Dr. Lisbeth Renshaw, and Hayden Pedro were located at [facility] Radiation Oncology Department.  [name] was located at home.    The above documentation reflects my direct findings during this shared patient visit. Please see the separate note by Dr. Lisbeth Renshaw on this date for the remainder of the patient's plan of care.    Carola Rhine, PAC

## 2019-11-27 NOTE — Telephone Encounter (Signed)
Noted! Thank you

## 2019-11-28 ENCOUNTER — Other Ambulatory Visit: Payer: Self-pay

## 2019-11-28 ENCOUNTER — Encounter (HOSPITAL_COMMUNITY): Payer: Self-pay | Admitting: Internal Medicine

## 2019-11-28 ENCOUNTER — Ambulatory Visit
Admission: RE | Admit: 2019-11-28 | Discharge: 2019-11-28 | Disposition: A | Discharge status: Home / Self Care | Payer: Medicare Other | Source: Ambulatory Visit | Attending: Radiation Oncology | Admitting: Radiation Oncology

## 2019-11-28 DIAGNOSIS — C3481 Malignant neoplasm of overlapping sites of right bronchus and lung: Secondary | ICD-10-CM | POA: Diagnosis not present

## 2019-11-28 DIAGNOSIS — Z510 Encounter for antineoplastic radiation therapy: Secondary | ICD-10-CM | POA: Diagnosis not present

## 2019-11-28 DIAGNOSIS — C3411 Malignant neoplasm of upper lobe, right bronchus or lung: Secondary | ICD-10-CM | POA: Diagnosis not present

## 2019-11-29 ENCOUNTER — Other Ambulatory Visit: Payer: Self-pay

## 2019-11-29 ENCOUNTER — Ambulatory Visit
Admission: RE | Admit: 2019-11-29 | Discharge: 2019-11-29 | Disposition: A | Discharge status: Home / Self Care | Payer: Medicare Other | Source: Ambulatory Visit | Attending: Radiation Oncology | Admitting: Radiation Oncology

## 2019-11-29 DIAGNOSIS — C3411 Malignant neoplasm of upper lobe, right bronchus or lung: Secondary | ICD-10-CM | POA: Diagnosis not present

## 2019-11-29 DIAGNOSIS — C3481 Malignant neoplasm of overlapping sites of right bronchus and lung: Secondary | ICD-10-CM | POA: Diagnosis not present

## 2019-11-29 DIAGNOSIS — Z510 Encounter for antineoplastic radiation therapy: Secondary | ICD-10-CM | POA: Diagnosis not present

## 2019-11-29 NOTE — Progress Notes (Signed)

## 2019-11-30 ENCOUNTER — Ambulatory Visit
Admission: RE | Admit: 2019-11-30 | Discharge: 2019-11-30 | Disposition: A | Discharge status: Home / Self Care | Payer: Medicare Other | Source: Ambulatory Visit | Attending: Radiation Oncology | Admitting: Radiation Oncology

## 2019-11-30 ENCOUNTER — Other Ambulatory Visit: Payer: Self-pay

## 2019-11-30 DIAGNOSIS — C3481 Malignant neoplasm of overlapping sites of right bronchus and lung: Secondary | ICD-10-CM | POA: Diagnosis not present

## 2019-11-30 DIAGNOSIS — C3411 Malignant neoplasm of upper lobe, right bronchus or lung: Secondary | ICD-10-CM | POA: Diagnosis not present

## 2019-11-30 DIAGNOSIS — C3491 Malignant neoplasm of unspecified part of right bronchus or lung: Secondary | ICD-10-CM

## 2019-11-30 DIAGNOSIS — Z510 Encounter for antineoplastic radiation therapy: Secondary | ICD-10-CM | POA: Diagnosis not present

## 2019-11-30 MED ORDER — SONAFINE EX EMUL
1.0000 "application " | Freq: Two times a day (BID) | CUTANEOUS | Status: DC
  Administered 2019-11-30: 1 via TOPICAL

## 2019-12-03 ENCOUNTER — Ambulatory Visit
Admission: RE | Admit: 2019-12-03 | Discharge: 2019-12-03 | Disposition: A | Discharge status: Home / Self Care | Payer: Medicare Other | Source: Ambulatory Visit | Attending: Radiation Oncology | Admitting: Radiation Oncology

## 2019-12-03 ENCOUNTER — Telehealth: Payer: Self-pay | Admitting: *Deleted

## 2019-12-03 ENCOUNTER — Telehealth: Payer: Self-pay | Admitting: Physician Assistant

## 2019-12-03 ENCOUNTER — Inpatient Hospital Stay: Payer: Medicare Other

## 2019-12-03 ENCOUNTER — Other Ambulatory Visit: Payer: Self-pay

## 2019-12-03 VITALS — BP 97/70 | HR 50 | Temp 98.4°F | Resp 18 | Ht 73.0 in | Wt 168.0 lb

## 2019-12-03 DIAGNOSIS — Z79899 Other long term (current) drug therapy: Secondary | ICD-10-CM | POA: Diagnosis not present

## 2019-12-03 DIAGNOSIS — I4891 Unspecified atrial fibrillation: Secondary | ICD-10-CM | POA: Diagnosis not present

## 2019-12-03 DIAGNOSIS — Z510 Encounter for antineoplastic radiation therapy: Secondary | ICD-10-CM | POA: Diagnosis not present

## 2019-12-03 DIAGNOSIS — F17210 Nicotine dependence, cigarettes, uncomplicated: Secondary | ICD-10-CM | POA: Diagnosis not present

## 2019-12-03 DIAGNOSIS — I10 Essential (primary) hypertension: Secondary | ICD-10-CM | POA: Diagnosis not present

## 2019-12-03 DIAGNOSIS — C3491 Malignant neoplasm of unspecified part of right bronchus or lung: Secondary | ICD-10-CM

## 2019-12-03 DIAGNOSIS — Z7901 Long term (current) use of anticoagulants: Secondary | ICD-10-CM | POA: Diagnosis not present

## 2019-12-03 DIAGNOSIS — C3481 Malignant neoplasm of overlapping sites of right bronchus and lung: Secondary | ICD-10-CM | POA: Diagnosis not present

## 2019-12-03 DIAGNOSIS — Z5111 Encounter for antineoplastic chemotherapy: Secondary | ICD-10-CM | POA: Diagnosis not present

## 2019-12-03 DIAGNOSIS — C3431 Malignant neoplasm of lower lobe, right bronchus or lung: Secondary | ICD-10-CM | POA: Diagnosis not present

## 2019-12-03 DIAGNOSIS — C3411 Malignant neoplasm of upper lobe, right bronchus or lung: Secondary | ICD-10-CM | POA: Diagnosis not present

## 2019-12-03 LAB — CMP (CANCER CENTER ONLY)
ALT: 10 U/L (ref 0–44)
AST: 17 U/L (ref 15–41)
Albumin: 3.6 g/dL (ref 3.5–5.0)
Alkaline Phosphatase: 48 U/L (ref 38–126)
Anion gap: 9 (ref 5–15)
BUN: 10 mg/dL (ref 8–23)
CO2: 20 mmol/L — ABNORMAL LOW (ref 22–32)
Calcium: 9.3 mg/dL (ref 8.9–10.3)
Chloride: 98 mmol/L (ref 98–111)
Creatinine: 0.74 mg/dL (ref 0.61–1.24)
GFR, Est AFR Am: >60 mL/min (ref >60)
GFR, Estimated: >60 mL/min (ref >60)
Glucose, Bld: 98 mg/dL (ref 70–99)
Potassium: 4.8 mmol/L (ref 3.5–5.1)
Sodium: 127 mmol/L — ABNORMAL LOW (ref 135–145)
Total Bilirubin: 0.5 mg/dL (ref 0.3–1.2)
Total Protein: 7.3 g/dL (ref 6.5–8.1)

## 2019-12-03 LAB — CBC WITH DIFFERENTIAL (CANCER CENTER ONLY)
Abs Immature Granulocytes: 0.04 10*3/uL (ref 0.00–0.07)
Basophils Absolute: 0 10*3/uL (ref 0.0–0.1)
Basophils Relative: 1 %
Eosinophils Absolute: 0 10*3/uL (ref 0.0–0.5)
Eosinophils Relative: 1 %
HCT: 35.7 % — ABNORMAL LOW (ref 39.0–52.0)
Hemoglobin: 12.8 g/dL — ABNORMAL LOW (ref 13.0–17.0)
Immature Granulocytes: 1 %
Lymphocytes Relative: 12 %
Lymphs Abs: 0.6 10*3/uL — ABNORMAL LOW (ref 0.7–4.0)
MCH: 34.4 pg — ABNORMAL HIGH (ref 26.0–34.0)
MCHC: 35.9 g/dL (ref 30.0–36.0)
MCV: 96 fL (ref 80.0–100.0)
Monocytes Absolute: 0.7 10*3/uL (ref 0.1–1.0)
Monocytes Relative: 13 %
Neutro Abs: 3.5 10*3/uL (ref 1.7–7.7)
Neutrophils Relative %: 72 %
Platelet Count: 255 10*3/uL (ref 150–400)
RBC: 3.72 MIL/uL — ABNORMAL LOW (ref 4.22–5.81)
RDW: 12.7 % (ref 11.5–15.5)
WBC Count: 4.9 10*3/uL (ref 4.0–10.5)
nRBC: 0 % (ref 0.0–0.2)

## 2019-12-03 MED ORDER — FAMOTIDINE IN NACL 20-0.9 MG/50ML-% IV SOLN
INTRAVENOUS | Status: AC
  Filled 2019-12-03: qty 50

## 2019-12-03 MED ORDER — FAMOTIDINE IN NACL 20-0.9 MG/50ML-% IV SOLN
20.0000 mg | Freq: Once | INTRAVENOUS | Status: AC
  Administered 2019-12-03: 20 mg via INTRAVENOUS

## 2019-12-03 MED ORDER — SODIUM CHLORIDE 0.9 % IV SOLN
20.0000 mg | Freq: Once | INTRAVENOUS | Status: AC
  Administered 2019-12-03: 20 mg via INTRAVENOUS
  Filled 2019-12-03: qty 20

## 2019-12-03 MED ORDER — DIPHENHYDRAMINE HCL 50 MG/ML IJ SOLN
50.0000 mg | Freq: Once | INTRAMUSCULAR | Status: AC
  Administered 2019-12-03: 50 mg via INTRAVENOUS

## 2019-12-03 MED ORDER — PALONOSETRON HCL INJECTION 0.25 MG/5ML
0.2500 mg | Freq: Once | INTRAVENOUS | Status: AC
  Administered 2019-12-03: 0.25 mg via INTRAVENOUS

## 2019-12-03 MED ORDER — SODIUM CHLORIDE 0.9 % IV SOLN
45.0000 mg/m2 | Freq: Once | INTRAVENOUS | Status: AC
  Administered 2019-12-03: 90 mg via INTRAVENOUS
  Filled 2019-12-03: qty 15

## 2019-12-03 MED ORDER — DIPHENHYDRAMINE HCL 50 MG/ML IJ SOLN
INTRAMUSCULAR | Status: AC
  Filled 2019-12-03: qty 1

## 2019-12-03 MED ORDER — PALONOSETRON HCL INJECTION 0.25 MG/5ML
INTRAVENOUS | Status: AC
  Filled 2019-12-03: qty 5

## 2019-12-03 MED ORDER — SODIUM CHLORIDE 0.9 % IV SOLN
Freq: Once | INTRAVENOUS | Status: AC
  Filled 2019-12-03: qty 250

## 2019-12-03 MED ORDER — SODIUM CHLORIDE 0.9 % IV SOLN
200.4000 mg | Freq: Once | INTRAVENOUS | Status: AC
  Administered 2019-12-03: 200 mg via INTRAVENOUS
  Filled 2019-12-03: qty 20

## 2019-12-03 NOTE — Telephone Encounter (Signed)
Called the patient regarding his lab work today. Sodium 127. He was advised to restrict his fluid intake. He expressed understanding. Will recheck labs next week.

## 2019-12-03 NOTE — Patient Instructions (Signed)
[place] Discharge Instructions for Patients Receiving Chemotherapy  Today you received the following chemotherapy agents: Taxol/Carbo.  To help prevent nausea and vomiting after your treatment, we encourage you to take your nausea medication as directed.   If you develop nausea and vomiting that is not controlled by your nausea medication, call the clinic.   BELOW ARE SYMPTOMS THAT SHOULD BE REPORTED IMMEDIATELY:  *FEVER GREATER THAN 100.5 F  *CHILLS WITH OR WITHOUT FEVER  NAUSEA AND VOMITING THAT IS NOT CONTROLLED WITH YOUR NAUSEA MEDICATION  *UNUSUAL SHORTNESS OF BREATH  *UNUSUAL BRUISING OR BLEEDING  TENDERNESS IN MOUTH AND THROAT WITH OR WITHOUT PRESENCE OF ULCERS  *URINARY PROBLEMS  *BOWEL PROBLEMS  UNUSUAL RASH Items with * indicate a potential emergency and should be followed up as soon as possible.  Feel free to call the clinic should you have any questions or concerns. The clinic phone number is [phone].  Please show the [place] at check-in to the Emergency Department and triage nurse.

## 2019-12-04 ENCOUNTER — Ambulatory Visit
Admission: RE | Admit: 2019-12-04 | Discharge: 2019-12-04 | Disposition: A | Discharge status: Home / Self Care | Payer: Medicare Other | Source: Ambulatory Visit | Attending: Radiation Oncology | Admitting: Radiation Oncology

## 2019-12-04 ENCOUNTER — Other Ambulatory Visit: Payer: Self-pay

## 2019-12-04 DIAGNOSIS — Z510 Encounter for antineoplastic radiation therapy: Secondary | ICD-10-CM | POA: Diagnosis not present

## 2019-12-04 DIAGNOSIS — C3481 Malignant neoplasm of overlapping sites of right bronchus and lung: Secondary | ICD-10-CM | POA: Diagnosis not present

## 2019-12-04 DIAGNOSIS — C3411 Malignant neoplasm of upper lobe, right bronchus or lung: Secondary | ICD-10-CM | POA: Diagnosis not present

## 2019-12-05 ENCOUNTER — Encounter (HOSPITAL_COMMUNITY): Payer: Self-pay | Admitting: Internal Medicine

## 2019-12-05 ENCOUNTER — Other Ambulatory Visit: Payer: Self-pay

## 2019-12-05 ENCOUNTER — Ambulatory Visit
Admission: RE | Admit: 2019-12-05 | Discharge: 2019-12-05 | Disposition: A | Discharge status: Home / Self Care | Payer: Medicare Other | Source: Ambulatory Visit | Attending: Radiation Oncology | Admitting: Radiation Oncology

## 2019-12-05 DIAGNOSIS — C3481 Malignant neoplasm of overlapping sites of right bronchus and lung: Secondary | ICD-10-CM | POA: Diagnosis not present

## 2019-12-05 DIAGNOSIS — Z510 Encounter for antineoplastic radiation therapy: Secondary | ICD-10-CM | POA: Diagnosis not present

## 2019-12-05 DIAGNOSIS — C3411 Malignant neoplasm of upper lobe, right bronchus or lung: Secondary | ICD-10-CM | POA: Diagnosis not present

## 2019-12-06 ENCOUNTER — Other Ambulatory Visit: Payer: Self-pay

## 2019-12-06 ENCOUNTER — Ambulatory Visit
Admission: RE | Admit: 2019-12-06 | Discharge: 2019-12-06 | Disposition: A | Discharge status: Home / Self Care | Payer: Medicare Other | Source: Ambulatory Visit | Attending: Radiation Oncology | Admitting: Radiation Oncology

## 2019-12-06 DIAGNOSIS — Z510 Encounter for antineoplastic radiation therapy: Secondary | ICD-10-CM | POA: Diagnosis not present

## 2019-12-06 DIAGNOSIS — C3481 Malignant neoplasm of overlapping sites of right bronchus and lung: Secondary | ICD-10-CM | POA: Insufficient documentation

## 2019-12-06 DIAGNOSIS — C3411 Malignant neoplasm of upper lobe, right bronchus or lung: Secondary | ICD-10-CM | POA: Diagnosis not present

## 2019-12-07 ENCOUNTER — Ambulatory Visit
Admission: RE | Admit: 2019-12-07 | Discharge: 2019-12-07 | Disposition: A | Discharge status: Home / Self Care | Payer: Medicare Other | Source: Ambulatory Visit | Attending: Radiation Oncology | Admitting: Radiation Oncology

## 2019-12-07 ENCOUNTER — Other Ambulatory Visit: Payer: Self-pay

## 2019-12-07 DIAGNOSIS — Z510 Encounter for antineoplastic radiation therapy: Secondary | ICD-10-CM | POA: Diagnosis not present

## 2019-12-07 DIAGNOSIS — C3411 Malignant neoplasm of upper lobe, right bronchus or lung: Secondary | ICD-10-CM | POA: Diagnosis not present

## 2019-12-07 DIAGNOSIS — C3481 Malignant neoplasm of overlapping sites of right bronchus and lung: Secondary | ICD-10-CM | POA: Diagnosis not present

## 2019-12-09 NOTE — Progress Notes (Signed)
[facility] OFFICE PROGRESS NOTE  Koirala, Dibas, MD [place] [address] [place] [address]  DIAGNOSIS: stage III (T3, N1/N2, M0)non-small cell lung cancer, adenocarcinoma. He presented with a right upper lobe lung mass with direct extension across the major fissure into the superior segment of the right lower lobe and ipsilateral hilar lymphadenopathy. There is questionable subcarinal lymphadenopathy versus esophageal hypermetabolism. The patient was diagnosed in May 2021. Molecular studies pending  PDL1: 70%  PRIOR THERAPY: None  CURRENT THERAPY: Weekly concurrent chemoradiation with carboplatin for an AUC of 2 and paclitaxel 45 mg/m.  First dose expected on 11/26/2019. Status post 2 cycles.   INTERVAL HISTORY: Christopher Harding 69 y.o. male returns to the clinic today for a follow-up visit.  The patient is feeling fairly well today without any concerning complaints.  The patient was recently diagnosed with stage III non-small cell lung cancer, adenocarcinoma.  The patient's staging PET scan noted focal hypermetabolism associated with the midesophagus versus a subcarinal lymph node.  The patient was referred to gastroenterology for consideration of endoscopy to further evaluate this.  The patient was scheduled for an endoscopy on Friday, 11/23/2019; however, this was canceled due to the patient not being NPO. Discussed with the patient at his last appointments that this should be performed at the earliest convenience to ensure the correct stage and best treatment. It does not appear that this is scheduled until 01/31/20.   The patient denies any new concerning complaints today except mild soreness with swallowing for which he started taking prilosec.  He denies any fever, chills, night sweats, or weight loss.  He denies any chest pain, cough, or hemoptysis.  He reports mild baseline shortness of breath.  He denies any dysphagia.  He denies any nausea, vomiting,  diarrhea, or constipation.  He denies any headache or visual changes. His last day of radiation is scheduled for 01/11/20  He is here today for evaluation before starting cycle #3.   MEDICAL HISTORY: Past Medical History:  Diagnosis Date  . A-fib [facility])    s/p DCCV 12/07/18  . Depression    situational  . Dysrhythmia   . Hypertension     ALLERGIES:  has No Known Allergies.  MEDICATIONS:  Current Outpatient Medications  Medication Sig Dispense Refill  . amlodipine-benazepril (LOTREL) 2.5-10 MG capsule Take 1 capsule by mouth daily. 90 capsule 3  . ibuprofen (ADVIL) 200 MG tablet Take 400 mg by mouth every 8 (eight) hours as needed (for pain). (Patient not taking: Reported on 11/26/2019)    . metoprolol tartrate (LOPRESSOR) 50 MG tablet Take 50 mg by mouth 2 (two) times daily with a meal.    . prochlorperazine (COMPAZINE) 10 MG tablet Take 1 tablet (10 mg total) by mouth every 6 (six) hours as needed. (Patient not taking: Reported on 11/16/2019) 30 tablet 2  . XARELTO 20 MG TABS tablet Take 20 mg by mouth daily in the afternoon.      Current Facility-Administered Medications  Medication Dose Route Frequency Provider Last Rate Last Admin  . 0.9 %  sodium chloride infusion  500 mL Intravenous Continuous [place] Park, MD        SURGICAL HISTORY:  Past Surgical History:  Procedure Laterality Date  . CARDIOVERSION N/A 12/07/2018   Procedure: CARDIOVERSION;  Surgeon: Josue Hector, MD;  Location: [facility];  Service: Cardiovascular;  Laterality: N/A;  . FINE NEEDLE ASPIRATION  10/30/2019   Procedure: FINE NEEDLE ASPIRATION (FNA) LINEAR;  Surgeon: Tamala Julian,  Darnelle Maffucci, MD;  Location: [facility] ENDOSCOPY;  Service: Pulmonary;;  . INGUINAL HERNIA REPAIR Right 01/25/2019   Procedure: RIGHT INGUINAL HERNIA REPAIR WITH MESH;  Surgeon: Coralie Keens, MD;  Location: [facility];  Service: General;  Laterality: Right;  TAP BLOCK  . INSERTION OF MESH Right 01/25/2019   Procedure: Insertion Of Mesh;  Surgeon:  Coralie Keens, MD;  Location: [facility];  Service: General;  Laterality: Right;  Marland Kitchen VIDEO BRONCHOSCOPY WITH ENDOBRONCHIAL ULTRASOUND N/A 10/30/2019   Procedure: VIDEO BRONCHOSCOPY WITH ENDOBRONCHIAL ULTRASOUND;  Surgeon: Candee Furbish, MD;  Location: [facility] ENDOSCOPY;  Service: Pulmonary;  Laterality: N/A;    REVIEW OF SYSTEMS:   Review of Systems  Constitutional: Negative for appetite change, chills, fatigue, fever and unexpected weight change.  HENT: Positive for mild odynophagia. Negative for mouth sores, nosebleeds, sore throat and trouble swallowing.  Eyes: Negative for eye problems and icterus.  Respiratory:Positive for mild shortness of breath.Negative for cough, hemoptysis, and wheezing.  Cardiovascular: Negative for chest pain and leg swelling.  Gastrointestinal: Negative for abdominal pain, constipation, diarrhea, nausea and vomiting.  Genitourinary: Negative for bladder incontinence, difficulty urinating, dysuria, frequency and hematuria.  Musculoskeletal: Negative for back pain, gait problem, neck pain and neck stiffness.  Skin: Negative for itching and rash.  Neurological: Negative for dizziness, extremity weakness, gait problem, headaches, light-headedness and seizures.  Hematological: Negative for adenopathy. Does not bruise/bleed easily.  Psychiatric/Behavioral: Negative for confusion, depression and sleep disturbance. The patient is not nervous/anxious.     PHYSICAL EXAMINATION:  Blood pressure 120/62, pulse 66, temperature 98.1 F (36.7 C), temperature source Temporal, resp. rate 18, height _0  (1.854 m), weight 166 lb 14.4 oz (75.7 kg), SpO2 100 %.  ECOG PERFORMANCE STATUS: 1 - Symptomatic but completely ambulatory  Physical Exam  Constitutional: Oriented to person, place, and time and well-developed, well-nourished, and in no distress.  HENT:  Head: Normocephalic and atraumatic.  Mouth/Throat: Oropharynx is clear and moist. No oropharyngeal exudate.  Eyes:  Conjunctivae are normal. Right eye exhibits no discharge. Left eye exhibits no discharge. No scleral icterus.  Neck: Normal range of motion. Neck supple.  Cardiovascular: Normal rate, regular rhythm, normal heart sounds and intact distal pulses.   Pulmonary/Chest: Effort normal. Minimal wheezing. No respiratory distress.  No rales.  Abdominal: Soft. Bowel sounds are normal. Exhibits no distension and no mass. There is no tenderness.  Musculoskeletal: Normal range of motion. Exhibits no edema.  Lymphadenopathy:    No cervical adenopathy.  Neurological: Alert and oriented to person, place, and time. Exhibits normal muscle tone. Gait normal. Coordination normal.  Skin: Skin is warm and dry. No rash noted. Not diaphoretic. No erythema. No pallor.  Psychiatric: Mood, memory and judgment normal.  Vitals reviewed.  LABORATORY DATA: Lab Results  Component Value Date   WBC 3.1 (L) 12/11/2019   HGB 12.2 (L) 12/11/2019   HCT 34.2 (L) 12/11/2019   MCV 96.6 12/11/2019   PLT 204 12/11/2019      Chemistry      Component Value Date/Time   NA 128 (L) 12/11/2019 1138   NA 129 (L) 11/24/2018 1520   K 4.4 12/11/2019 1138   CL 99 12/11/2019 1138   CO2 19 (L) 12/11/2019 1138   BUN 6 (L) 12/11/2019 1138   BUN 8 11/24/2018 1520   CREATININE 0.68 12/11/2019 1138      Component Value Date/Time   CALCIUM 8.9 12/11/2019 1138   ALKPHOS 40 12/11/2019 1138   AST 19 12/11/2019 1138   ALT  10 12/11/2019 1138   BILITOT 0.4 12/11/2019 1138       RADIOGRAPHIC STUDIES:  NM PET Image Initial (PI) Skull Base To Thigh  Result Date: 11/12/2019 CLINICAL DATA:  Initial treatment strategy for lung cancer. EXAM: NUCLEAR MEDICINE PET SKULL BASE TO THIGH TECHNIQUE: 8.8 mCi F-18 FDG was injected intravenously. Full-ring PET imaging was performed from the skull base to thigh after the radiotracer. CT data was obtained and used for attenuation correction and anatomic localization. Fasting blood glucose: 77 mg/dl  COMPARISON:  CT chest 10/01/2019. FINDINGS: Mediastinal blood pool activity: SUV max 2.7 Liver activity: SUV max NA NECK: No abnormal hypermetabolism. Incidental CT findings: None. CHEST: Right hilar hypermetabolism has an SUV max of 9.8 and corresponds to an 11 mm lymph node (4/83). There is focal hypermetabolism associated with the mid esophagus versus a subcarinal lymph node, with an SUV max of 21.0. Absence of IV contrast is limiting on CT. A mass in the posterior segment right upper lobe measures 5.4 x 5.6 cm with an SUV max of 17.6. Incidental CT findings: Atherosclerotic calcification of the aorta, aortic valve and coronary arteries. Heart size normal. No pericardial effusion. Very mild postobstructive pneumonitis in the right upper lobe and superior segment right lower lobe. Calcified granuloma in the right lower lobe. ABDOMEN/PELVIS: No abnormal hypermetabolism in the liver, adrenal glands, spleen or pancreas. No hypermetabolic lymph nodes. Incidental CT findings: Liver is unremarkable. There may be sludge layering in the gallbladder. Adrenal glands, kidneys, spleen, pancreas, stomach and bowel are grossly unremarkable. Atherosclerotic calcification of the aorta without aneurysm. Slight bladder wall trabeculation. SKELETON: No abnormal hypermetabolism. Incidental CT findings: Degenerative changes in the spine. IMPRESSION: 1. Hypermetabolic right upper lobe mass and ipsilateral right hilar adenopathy, findings most consistent with primary bronchogenic carcinoma. 2. Hypermetabolic subcarinal lymph node versus esophageal mass. 3. Aortic atherosclerosis (ICD10-I70.0). Coronary artery calcification. Electronically Signed   By: Lorin Picket M.D.   On: 11/12/2019 13:03     ASSESSMENT/PLAN:  This is a very pleasant 69 year old Caucasian male recently diagnosed with stage III (T3, N1/N2, M0)non-small cell lung cancer, adenocarcinoma. He presented with a right upper lobe lung mass with direct extension  across the major fissure into the superior segment of the right lower lobe and ipsilateral hilar lymphadenopathy. There is questionable subcarinal lymphadenopathy versus esophageal hypermetabolism. The patient was diagnosed in May 2021. Molecular studies pending. His PDL1 expression in 70%  The patient is currently undergoing weekly concurrent chemoradiation with carboplatin for an AUC of 2 and paclitaxel 45 mgm2.He is status post 2 cycles. His last day of radiation is scheduled for 01/11/20.   Labs were reviewed. Recommend that he proceed with cycle #3 today as scheduled.   We will see him back for a follow up visit in 2 weeks for evaluation before starting cycle #5.   The patient was advised to call immediately if he has any concerning symptoms in the interval. The patient voices understanding of current disease status and treatment options and is in agreement with the current care plan. All questions were answered. The patient knows to call the clinic with any problems, questions or concerns. We can certainly see the patient much sooner if necessary   No orders of the defined types were placed in this encounter.    Ilia Engelbert L Esvin Hnat, PA-C 12/11/19

## 2019-12-11 ENCOUNTER — Inpatient Hospital Stay: Payer: Medicare Other

## 2019-12-11 ENCOUNTER — Ambulatory Visit
Admission: RE | Admit: 2019-12-11 | Discharge: 2019-12-11 | Disposition: A | Discharge status: Home / Self Care | Payer: Medicare Other | Source: Ambulatory Visit | Attending: Radiation Oncology | Admitting: Radiation Oncology

## 2019-12-11 ENCOUNTER — Inpatient Hospital Stay (HOSPITAL_BASED_OUTPATIENT_CLINIC_OR_DEPARTMENT_OTHER): Payer: Medicare Other | Admitting: Physician Assistant

## 2019-12-11 ENCOUNTER — Inpatient Hospital Stay: Payer: Medicare Other | Attending: Physician Assistant

## 2019-12-11 ENCOUNTER — Other Ambulatory Visit: Payer: Self-pay

## 2019-12-11 VITALS — BP 120/62 | HR 66 | Temp 98.1°F | Resp 18 | Ht 73.0 in | Wt 166.9 lb

## 2019-12-11 DIAGNOSIS — I10 Essential (primary) hypertension: Secondary | ICD-10-CM | POA: Insufficient documentation

## 2019-12-11 DIAGNOSIS — Z79899 Other long term (current) drug therapy: Secondary | ICD-10-CM | POA: Diagnosis not present

## 2019-12-11 DIAGNOSIS — Z5111 Encounter for antineoplastic chemotherapy: Secondary | ICD-10-CM

## 2019-12-11 DIAGNOSIS — C3491 Malignant neoplasm of unspecified part of right bronchus or lung: Secondary | ICD-10-CM

## 2019-12-11 DIAGNOSIS — C3481 Malignant neoplasm of overlapping sites of right bronchus and lung: Secondary | ICD-10-CM | POA: Diagnosis not present

## 2019-12-11 DIAGNOSIS — Z7901 Long term (current) use of anticoagulants: Secondary | ICD-10-CM | POA: Diagnosis not present

## 2019-12-11 DIAGNOSIS — C3431 Malignant neoplasm of lower lobe, right bronchus or lung: Secondary | ICD-10-CM | POA: Diagnosis not present

## 2019-12-11 DIAGNOSIS — Z510 Encounter for antineoplastic radiation therapy: Secondary | ICD-10-CM | POA: Diagnosis not present

## 2019-12-11 DIAGNOSIS — I4891 Unspecified atrial fibrillation: Secondary | ICD-10-CM | POA: Insufficient documentation

## 2019-12-11 DIAGNOSIS — C3411 Malignant neoplasm of upper lobe, right bronchus or lung: Secondary | ICD-10-CM | POA: Diagnosis not present

## 2019-12-11 LAB — CMP (CANCER CENTER ONLY)
ALT: 10 U/L (ref 0–44)
AST: 19 U/L (ref 15–41)
Albumin: 3.5 g/dL (ref 3.5–5.0)
Alkaline Phosphatase: 40 U/L (ref 38–126)
Anion gap: 10 (ref 5–15)
BUN: 6 mg/dL — ABNORMAL LOW (ref 8–23)
CO2: 19 mmol/L — ABNORMAL LOW (ref 22–32)
Calcium: 8.9 mg/dL (ref 8.9–10.3)
Chloride: 99 mmol/L (ref 98–111)
Creatinine: 0.68 mg/dL (ref 0.61–1.24)
GFR, Est AFR Am: >60 mL/min (ref >60)
GFR, Estimated: >60 mL/min (ref >60)
Glucose, Bld: 92 mg/dL (ref 70–99)
Potassium: 4.4 mmol/L (ref 3.5–5.1)
Sodium: 128 mmol/L — ABNORMAL LOW (ref 135–145)
Total Bilirubin: 0.4 mg/dL (ref 0.3–1.2)
Total Protein: 6.9 g/dL (ref 6.5–8.1)

## 2019-12-11 LAB — CBC WITH DIFFERENTIAL (CANCER CENTER ONLY)
Abs Immature Granulocytes: 0.03 10*3/uL (ref 0.00–0.07)
Basophils Absolute: 0 10*3/uL (ref 0.0–0.1)
Basophils Relative: 0 %
Eosinophils Absolute: 0 10*3/uL (ref 0.0–0.5)
Eosinophils Relative: 0 %
HCT: 34.2 % — ABNORMAL LOW (ref 39.0–52.0)
Hemoglobin: 12.2 g/dL — ABNORMAL LOW (ref 13.0–17.0)
Immature Granulocytes: 1 %
Lymphocytes Relative: 13 %
Lymphs Abs: 0.4 10*3/uL — ABNORMAL LOW (ref 0.7–4.0)
MCH: 34.5 pg — ABNORMAL HIGH (ref 26.0–34.0)
MCHC: 35.7 g/dL (ref 30.0–36.0)
MCV: 96.6 fL (ref 80.0–100.0)
Monocytes Absolute: 0.6 10*3/uL (ref 0.1–1.0)
Monocytes Relative: 19 %
Neutro Abs: 2 10*3/uL (ref 1.7–7.7)
Neutrophils Relative %: 67 %
Platelet Count: 204 10*3/uL (ref 150–400)
RBC: 3.54 MIL/uL — ABNORMAL LOW (ref 4.22–5.81)
RDW: 13.2 % (ref 11.5–15.5)
WBC Count: 3.1 10*3/uL — ABNORMAL LOW (ref 4.0–10.5)
nRBC: 0 % (ref 0.0–0.2)

## 2019-12-11 MED ORDER — SODIUM CHLORIDE 0.9 % IV SOLN
200.4000 mg | Freq: Once | INTRAVENOUS | Status: AC
  Administered 2019-12-11: 200 mg via INTRAVENOUS
  Filled 2019-12-11: qty 20

## 2019-12-11 MED ORDER — FAMOTIDINE IN NACL 20-0.9 MG/50ML-% IV SOLN
20.0000 mg | Freq: Once | INTRAVENOUS | Status: AC
  Administered 2019-12-11: 20 mg via INTRAVENOUS

## 2019-12-11 MED ORDER — PALONOSETRON HCL INJECTION 0.25 MG/5ML
0.2500 mg | Freq: Once | INTRAVENOUS | Status: AC
  Administered 2019-12-11: 0.25 mg via INTRAVENOUS

## 2019-12-11 MED ORDER — DIPHENHYDRAMINE HCL 50 MG/ML IJ SOLN
INTRAMUSCULAR | Status: AC
  Filled 2019-12-11: qty 1

## 2019-12-11 MED ORDER — FAMOTIDINE IN NACL 20-0.9 MG/50ML-% IV SOLN
INTRAVENOUS | Status: AC
  Filled 2019-12-11: qty 50

## 2019-12-11 MED ORDER — SODIUM CHLORIDE 0.9% FLUSH
10.0000 mL | INTRAVENOUS | Status: DC | PRN
  Filled 2019-12-11: qty 10

## 2019-12-11 MED ORDER — PALONOSETRON HCL INJECTION 0.25 MG/5ML
INTRAVENOUS | Status: AC
  Filled 2019-12-11: qty 5

## 2019-12-11 MED ORDER — SODIUM CHLORIDE 0.9 % IV SOLN
45.0000 mg/m2 | Freq: Once | INTRAVENOUS | Status: AC
  Administered 2019-12-11: 90 mg via INTRAVENOUS
  Filled 2019-12-11: qty 15

## 2019-12-11 MED ORDER — SODIUM CHLORIDE 0.9 % IV SOLN
Freq: Once | INTRAVENOUS | Status: AC
  Filled 2019-12-11: qty 250

## 2019-12-11 MED ORDER — DIPHENHYDRAMINE HCL 50 MG/ML IJ SOLN
50.0000 mg | Freq: Once | INTRAMUSCULAR | Status: AC
  Administered 2019-12-11: 50 mg via INTRAVENOUS

## 2019-12-11 MED ORDER — SODIUM CHLORIDE 0.9 % IV SOLN
20.0000 mg | Freq: Once | INTRAVENOUS | Status: AC
  Administered 2019-12-11: 20 mg via INTRAVENOUS
  Filled 2019-12-11: qty 20

## 2019-12-11 MED ORDER — HEPARIN SOD (PORK) LOCK FLUSH 100 UNIT/ML IV SOLN
500.0000 [IU] | Freq: Once | INTRAVENOUS | Status: DC | PRN
  Filled 2019-12-11: qty 5

## 2019-12-11 NOTE — Patient Instructions (Signed)
[facility] Discharge Instructions for Patients Receiving Chemotherapy  Today you received the following chemotherapy agents: Taxol/Carbo.  To help prevent nausea and vomiting after your treatment, we encourage you to take your nausea medication as directed.   If you develop nausea and vomiting that is not controlled by your nausea medication, call the clinic.   BELOW ARE SYMPTOMS THAT SHOULD BE REPORTED IMMEDIATELY:  *FEVER GREATER THAN 100.5 F  *CHILLS WITH OR WITHOUT FEVER  NAUSEA AND VOMITING THAT IS NOT CONTROLLED WITH YOUR NAUSEA MEDICATION  *UNUSUAL SHORTNESS OF BREATH  *UNUSUAL BRUISING OR BLEEDING  TENDERNESS IN MOUTH AND THROAT WITH OR WITHOUT PRESENCE OF ULCERS  *URINARY PROBLEMS  *BOWEL PROBLEMS  UNUSUAL RASH Items with * indicate a potential emergency and should be followed up as soon as possible.  Feel free to call the clinic should you have any questions or concerns. The clinic phone number is [phone].  Please show the [facility] at check-in to the Emergency Department and triage nurse.

## 2019-12-12 ENCOUNTER — Ambulatory Visit
Admission: RE | Admit: 2019-12-12 | Discharge: 2019-12-12 | Disposition: A | Discharge status: Home / Self Care | Payer: Medicare Other | Source: Ambulatory Visit | Attending: Radiation Oncology | Admitting: Radiation Oncology

## 2019-12-12 ENCOUNTER — Other Ambulatory Visit: Payer: Self-pay

## 2019-12-12 DIAGNOSIS — C3481 Malignant neoplasm of overlapping sites of right bronchus and lung: Secondary | ICD-10-CM | POA: Diagnosis not present

## 2019-12-12 DIAGNOSIS — Z510 Encounter for antineoplastic radiation therapy: Secondary | ICD-10-CM | POA: Diagnosis not present

## 2019-12-12 DIAGNOSIS — C3411 Malignant neoplasm of upper lobe, right bronchus or lung: Secondary | ICD-10-CM | POA: Diagnosis not present

## 2019-12-13 ENCOUNTER — Ambulatory Visit
Admission: RE | Admit: 2019-12-13 | Discharge: 2019-12-13 | Disposition: A | Discharge status: Home / Self Care | Payer: Medicare Other | Source: Ambulatory Visit | Attending: Radiation Oncology | Admitting: Radiation Oncology

## 2019-12-13 ENCOUNTER — Other Ambulatory Visit: Payer: Self-pay

## 2019-12-13 DIAGNOSIS — C3481 Malignant neoplasm of overlapping sites of right bronchus and lung: Secondary | ICD-10-CM | POA: Diagnosis not present

## 2019-12-13 DIAGNOSIS — Z510 Encounter for antineoplastic radiation therapy: Secondary | ICD-10-CM | POA: Diagnosis not present

## 2019-12-13 DIAGNOSIS — C3411 Malignant neoplasm of upper lobe, right bronchus or lung: Secondary | ICD-10-CM | POA: Diagnosis not present

## 2019-12-14 ENCOUNTER — Ambulatory Visit
Admission: RE | Admit: 2019-12-14 | Discharge: 2019-12-14 | Disposition: A | Discharge status: Home / Self Care | Payer: Medicare Other | Source: Ambulatory Visit | Attending: Radiation Oncology | Admitting: Radiation Oncology

## 2019-12-14 ENCOUNTER — Other Ambulatory Visit: Payer: Self-pay

## 2019-12-14 DIAGNOSIS — C3411 Malignant neoplasm of upper lobe, right bronchus or lung: Secondary | ICD-10-CM | POA: Diagnosis not present

## 2019-12-14 DIAGNOSIS — Z510 Encounter for antineoplastic radiation therapy: Secondary | ICD-10-CM | POA: Diagnosis not present

## 2019-12-14 DIAGNOSIS — C3481 Malignant neoplasm of overlapping sites of right bronchus and lung: Secondary | ICD-10-CM | POA: Diagnosis not present

## 2019-12-16 ENCOUNTER — Ambulatory Visit: Admission: RE | Admit: 2019-12-16 | Payer: Medicare Other | Source: Ambulatory Visit

## 2019-12-17 ENCOUNTER — Inpatient Hospital Stay: Payer: Medicare Other

## 2019-12-17 ENCOUNTER — Other Ambulatory Visit: Payer: Self-pay

## 2019-12-17 ENCOUNTER — Ambulatory Visit
Admission: RE | Admit: 2019-12-17 | Discharge: 2019-12-17 | Disposition: A | Discharge status: Home / Self Care | Payer: Medicare Other | Source: Ambulatory Visit | Attending: Radiation Oncology | Admitting: Radiation Oncology

## 2019-12-17 VITALS — BP 132/73 | HR 52 | Temp 97.7°F | Resp 16

## 2019-12-17 DIAGNOSIS — C3431 Malignant neoplasm of lower lobe, right bronchus or lung: Secondary | ICD-10-CM | POA: Diagnosis not present

## 2019-12-17 DIAGNOSIS — Z510 Encounter for antineoplastic radiation therapy: Secondary | ICD-10-CM | POA: Diagnosis not present

## 2019-12-17 DIAGNOSIS — Z7901 Long term (current) use of anticoagulants: Secondary | ICD-10-CM | POA: Diagnosis not present

## 2019-12-17 DIAGNOSIS — C3491 Malignant neoplasm of unspecified part of right bronchus or lung: Secondary | ICD-10-CM

## 2019-12-17 DIAGNOSIS — Z5111 Encounter for antineoplastic chemotherapy: Secondary | ICD-10-CM | POA: Diagnosis not present

## 2019-12-17 DIAGNOSIS — C3411 Malignant neoplasm of upper lobe, right bronchus or lung: Secondary | ICD-10-CM | POA: Diagnosis not present

## 2019-12-17 DIAGNOSIS — Z79899 Other long term (current) drug therapy: Secondary | ICD-10-CM | POA: Diagnosis not present

## 2019-12-17 DIAGNOSIS — C3481 Malignant neoplasm of overlapping sites of right bronchus and lung: Secondary | ICD-10-CM | POA: Diagnosis not present

## 2019-12-17 DIAGNOSIS — I4891 Unspecified atrial fibrillation: Secondary | ICD-10-CM | POA: Diagnosis not present

## 2019-12-17 DIAGNOSIS — I10 Essential (primary) hypertension: Secondary | ICD-10-CM | POA: Diagnosis not present

## 2019-12-17 LAB — CBC WITH DIFFERENTIAL (CANCER CENTER ONLY)
Abs Immature Granulocytes: 0.02 10*3/uL (ref 0.00–0.07)
Basophils Absolute: 0 10*3/uL (ref 0.0–0.1)
Basophils Relative: 1 %
Eosinophils Absolute: 0 10*3/uL (ref 0.0–0.5)
Eosinophils Relative: 1 %
HCT: 33.4 % — ABNORMAL LOW (ref 39.0–52.0)
Hemoglobin: 12.4 g/dL — ABNORMAL LOW (ref 13.0–17.0)
Immature Granulocytes: 1 %
Lymphocytes Relative: 15 %
Lymphs Abs: 0.5 10*3/uL — ABNORMAL LOW (ref 0.7–4.0)
MCH: 35.3 pg — ABNORMAL HIGH (ref 26.0–34.0)
MCHC: 37.1 g/dL — ABNORMAL HIGH (ref 30.0–36.0)
MCV: 95.2 fL (ref 80.0–100.0)
Monocytes Absolute: 0.5 10*3/uL (ref 0.1–1.0)
Monocytes Relative: 14 %
Neutro Abs: 2.4 10*3/uL (ref 1.7–7.7)
Neutrophils Relative %: 68 %
Platelet Count: 172 10*3/uL (ref 150–400)
RBC: 3.51 MIL/uL — ABNORMAL LOW (ref 4.22–5.81)
RDW: 13.5 % (ref 11.5–15.5)
WBC Count: 3.5 10*3/uL — ABNORMAL LOW (ref 4.0–10.5)
nRBC: 0 % (ref 0.0–0.2)

## 2019-12-17 LAB — CMP (CANCER CENTER ONLY)
ALT: 9 U/L (ref 0–44)
AST: 17 U/L (ref 15–41)
Albumin: 3.4 g/dL — ABNORMAL LOW (ref 3.5–5.0)
Alkaline Phosphatase: 43 U/L (ref 38–126)
Anion gap: 10 (ref 5–15)
BUN: 8 mg/dL (ref 8–23)
CO2: 20 mmol/L — ABNORMAL LOW (ref 22–32)
Calcium: 9 mg/dL (ref 8.9–10.3)
Chloride: 99 mmol/L (ref 98–111)
Creatinine: 0.72 mg/dL (ref 0.61–1.24)
GFR, Est AFR Am: >60 mL/min (ref >60)
GFR, Estimated: >60 mL/min (ref >60)
Glucose, Bld: 99 mg/dL (ref 70–99)
Potassium: 4.5 mmol/L (ref 3.5–5.1)
Sodium: 129 mmol/L — ABNORMAL LOW (ref 135–145)
Total Bilirubin: 0.6 mg/dL (ref 0.3–1.2)
Total Protein: 6.8 g/dL (ref 6.5–8.1)

## 2019-12-17 MED ORDER — DIPHENHYDRAMINE HCL 50 MG/ML IJ SOLN
INTRAMUSCULAR | Status: AC
  Filled 2019-12-17: qty 1

## 2019-12-17 MED ORDER — PALONOSETRON HCL INJECTION 0.25 MG/5ML
0.2500 mg | Freq: Once | INTRAVENOUS | Status: AC
  Administered 2019-12-17: 0.25 mg via INTRAVENOUS

## 2019-12-17 MED ORDER — SODIUM CHLORIDE 0.9 % IV SOLN
20.0000 mg | Freq: Once | INTRAVENOUS | Status: AC
  Administered 2019-12-17: 20 mg via INTRAVENOUS
  Filled 2019-12-17: qty 20

## 2019-12-17 MED ORDER — SODIUM CHLORIDE 0.9 % IV SOLN
Freq: Once | INTRAVENOUS | Status: AC
  Filled 2019-12-17: qty 250

## 2019-12-17 MED ORDER — FAMOTIDINE IN NACL 20-0.9 MG/50ML-% IV SOLN
INTRAVENOUS | Status: AC
  Filled 2019-12-17: qty 50

## 2019-12-17 MED ORDER — SODIUM CHLORIDE 0.9 % IV SOLN
200.4000 mg | Freq: Once | INTRAVENOUS | Status: AC
  Administered 2019-12-17: 200 mg via INTRAVENOUS
  Filled 2019-12-17: qty 20

## 2019-12-17 MED ORDER — SODIUM CHLORIDE 0.9 % IV SOLN
45.0000 mg/m2 | Freq: Once | INTRAVENOUS | Status: AC
  Administered 2019-12-17: 90 mg via INTRAVENOUS
  Filled 2019-12-17: qty 15

## 2019-12-17 MED ORDER — PALONOSETRON HCL INJECTION 0.25 MG/5ML
INTRAVENOUS | Status: AC
  Filled 2019-12-17: qty 5

## 2019-12-17 MED ORDER — DIPHENHYDRAMINE HCL 50 MG/ML IJ SOLN
50.0000 mg | Freq: Once | INTRAMUSCULAR | Status: AC
  Administered 2019-12-17: 50 mg via INTRAVENOUS

## 2019-12-17 MED ORDER — FAMOTIDINE IN NACL 20-0.9 MG/50ML-% IV SOLN
20.0000 mg | Freq: Once | INTRAVENOUS | Status: AC
  Administered 2019-12-17: 20 mg via INTRAVENOUS

## 2019-12-17 NOTE — Patient Instructions (Signed)
[facility] Discharge Instructions for Patients Receiving Chemotherapy  Today you received the following chemotherapy agents: Paclitaxel and Carboplatin   To help prevent nausea and vomiting after your treatment, we encourage you to take your nausea medication as prescribed.    If you develop nausea and vomiting that is not controlled by your nausea medication, call the clinic.   BELOW ARE SYMPTOMS THAT SHOULD BE REPORTED IMMEDIATELY:  *FEVER GREATER THAN 100.5 F  *CHILLS WITH OR WITHOUT FEVER  NAUSEA AND VOMITING THAT IS NOT CONTROLLED WITH YOUR NAUSEA MEDICATION  *UNUSUAL SHORTNESS OF BREATH  *UNUSUAL BRUISING OR BLEEDING  TENDERNESS IN MOUTH AND THROAT WITH OR WITHOUT PRESENCE OF ULCERS  *URINARY PROBLEMS  *BOWEL PROBLEMS  UNUSUAL RASH Items with * indicate a potential emergency and should be followed up as soon as possible.  Feel free to call the clinic should you have any questions or concerns. The clinic phone number is [phone].  Please show the [facility] at check-in to the Emergency Department and triage nurse.

## 2019-12-18 ENCOUNTER — Ambulatory Visit
Admission: RE | Admit: 2019-12-18 | Discharge: 2019-12-18 | Disposition: A | Discharge status: Home / Self Care | Payer: Medicare Other | Source: Ambulatory Visit | Attending: Radiation Oncology | Admitting: Radiation Oncology

## 2019-12-18 ENCOUNTER — Other Ambulatory Visit: Payer: Self-pay

## 2019-12-18 DIAGNOSIS — C3411 Malignant neoplasm of upper lobe, right bronchus or lung: Secondary | ICD-10-CM | POA: Diagnosis not present

## 2019-12-18 DIAGNOSIS — C3481 Malignant neoplasm of overlapping sites of right bronchus and lung: Secondary | ICD-10-CM | POA: Diagnosis not present

## 2019-12-18 DIAGNOSIS — Z510 Encounter for antineoplastic radiation therapy: Secondary | ICD-10-CM | POA: Diagnosis not present

## 2019-12-19 ENCOUNTER — Other Ambulatory Visit: Payer: Self-pay | Admitting: Radiation Oncology

## 2019-12-19 ENCOUNTER — Ambulatory Visit
Admission: RE | Admit: 2019-12-19 | Discharge: 2019-12-19 | Disposition: A | Discharge status: Home / Self Care | Payer: Medicare Other | Source: Ambulatory Visit | Attending: Radiation Oncology | Admitting: Radiation Oncology

## 2019-12-19 ENCOUNTER — Other Ambulatory Visit: Payer: Self-pay

## 2019-12-19 DIAGNOSIS — C3481 Malignant neoplasm of overlapping sites of right bronchus and lung: Secondary | ICD-10-CM | POA: Diagnosis not present

## 2019-12-19 DIAGNOSIS — C3411 Malignant neoplasm of upper lobe, right bronchus or lung: Secondary | ICD-10-CM | POA: Diagnosis not present

## 2019-12-19 DIAGNOSIS — Z510 Encounter for antineoplastic radiation therapy: Secondary | ICD-10-CM | POA: Diagnosis not present

## 2019-12-19 MED ORDER — SUCRALFATE 1 G PO TABS: ORAL_TABLET | ORAL | 2 refills | Status: DC

## 2019-12-20 ENCOUNTER — Ambulatory Visit
Admission: RE | Admit: 2019-12-20 | Discharge: 2019-12-20 | Disposition: A | Discharge status: Home / Self Care | Payer: Medicare Other | Source: Ambulatory Visit | Attending: Radiation Oncology | Admitting: Radiation Oncology

## 2019-12-20 ENCOUNTER — Other Ambulatory Visit: Payer: Self-pay

## 2019-12-20 DIAGNOSIS — Z510 Encounter for antineoplastic radiation therapy: Secondary | ICD-10-CM | POA: Diagnosis not present

## 2019-12-20 DIAGNOSIS — C3481 Malignant neoplasm of overlapping sites of right bronchus and lung: Secondary | ICD-10-CM | POA: Diagnosis not present

## 2019-12-20 DIAGNOSIS — C3411 Malignant neoplasm of upper lobe, right bronchus or lung: Secondary | ICD-10-CM | POA: Diagnosis not present

## 2019-12-21 ENCOUNTER — Ambulatory Visit
Admission: RE | Admit: 2019-12-21 | Discharge: 2019-12-21 | Disposition: A | Discharge status: Home / Self Care | Payer: Medicare Other | Source: Ambulatory Visit | Attending: Radiation Oncology | Admitting: Radiation Oncology

## 2019-12-21 ENCOUNTER — Other Ambulatory Visit: Payer: Self-pay

## 2019-12-21 DIAGNOSIS — C3411 Malignant neoplasm of upper lobe, right bronchus or lung: Secondary | ICD-10-CM | POA: Diagnosis not present

## 2019-12-21 DIAGNOSIS — Z510 Encounter for antineoplastic radiation therapy: Secondary | ICD-10-CM | POA: Diagnosis not present

## 2019-12-21 DIAGNOSIS — C3481 Malignant neoplasm of overlapping sites of right bronchus and lung: Secondary | ICD-10-CM | POA: Diagnosis not present

## 2019-12-24 ENCOUNTER — Inpatient Hospital Stay (HOSPITAL_BASED_OUTPATIENT_CLINIC_OR_DEPARTMENT_OTHER): Payer: Medicare Other | Admitting: Internal Medicine

## 2019-12-24 ENCOUNTER — Ambulatory Visit
Admission: RE | Admit: 2019-12-24 | Discharge: 2019-12-24 | Disposition: A | Discharge status: Home / Self Care | Payer: Medicare Other | Source: Ambulatory Visit | Attending: Radiation Oncology | Admitting: Radiation Oncology

## 2019-12-24 ENCOUNTER — Encounter: Payer: Self-pay | Admitting: Internal Medicine

## 2019-12-24 ENCOUNTER — Other Ambulatory Visit: Payer: Self-pay

## 2019-12-24 ENCOUNTER — Inpatient Hospital Stay: Payer: Medicare Other

## 2019-12-24 VITALS — BP 116/67 | HR 66 | Temp 97.9°F | Resp 18 | Ht 73.0 in | Wt 165.7 lb

## 2019-12-24 DIAGNOSIS — Z79899 Other long term (current) drug therapy: Secondary | ICD-10-CM | POA: Diagnosis not present

## 2019-12-24 DIAGNOSIS — C3411 Malignant neoplasm of upper lobe, right bronchus or lung: Secondary | ICD-10-CM | POA: Diagnosis not present

## 2019-12-24 DIAGNOSIS — Z5111 Encounter for antineoplastic chemotherapy: Secondary | ICD-10-CM | POA: Diagnosis not present

## 2019-12-24 DIAGNOSIS — Z720 Tobacco use: Secondary | ICD-10-CM

## 2019-12-24 DIAGNOSIS — C3491 Malignant neoplasm of unspecified part of right bronchus or lung: Secondary | ICD-10-CM

## 2019-12-24 DIAGNOSIS — C3481 Malignant neoplasm of overlapping sites of right bronchus and lung: Secondary | ICD-10-CM | POA: Diagnosis not present

## 2019-12-24 DIAGNOSIS — Z510 Encounter for antineoplastic radiation therapy: Secondary | ICD-10-CM | POA: Diagnosis not present

## 2019-12-24 DIAGNOSIS — I10 Essential (primary) hypertension: Secondary | ICD-10-CM | POA: Diagnosis not present

## 2019-12-24 DIAGNOSIS — I4891 Unspecified atrial fibrillation: Secondary | ICD-10-CM | POA: Diagnosis not present

## 2019-12-24 DIAGNOSIS — Z7901 Long term (current) use of anticoagulants: Secondary | ICD-10-CM | POA: Diagnosis not present

## 2019-12-24 DIAGNOSIS — C3431 Malignant neoplasm of lower lobe, right bronchus or lung: Secondary | ICD-10-CM | POA: Diagnosis not present

## 2019-12-24 LAB — CMP (CANCER CENTER ONLY)
ALT: 7 U/L (ref 0–44)
AST: 14 U/L — ABNORMAL LOW (ref 15–41)
Albumin: 3.5 g/dL (ref 3.5–5.0)
Alkaline Phosphatase: 47 U/L (ref 38–126)
Anion gap: 8 (ref 5–15)
BUN: 8 mg/dL (ref 8–23)
CO2: 22 mmol/L (ref 22–32)
Calcium: 8.9 mg/dL (ref 8.9–10.3)
Chloride: 100 mmol/L (ref 98–111)
Creatinine: 0.71 mg/dL (ref 0.61–1.24)
GFR, Est AFR Am: >60 mL/min (ref >60)
GFR, Estimated: >60 mL/min (ref >60)
Glucose, Bld: 87 mg/dL (ref 70–99)
Potassium: 4.6 mmol/L (ref 3.5–5.1)
Sodium: 130 mmol/L — ABNORMAL LOW (ref 135–145)
Total Bilirubin: 0.5 mg/dL (ref 0.3–1.2)
Total Protein: 7 g/dL (ref 6.5–8.1)

## 2019-12-24 LAB — CBC WITH DIFFERENTIAL (CANCER CENTER ONLY)
Abs Immature Granulocytes: 0.03 10*3/uL (ref 0.00–0.07)
Basophils Absolute: 0 10*3/uL (ref 0.0–0.1)
Basophils Relative: 1 %
Eosinophils Absolute: 0 10*3/uL (ref 0.0–0.5)
Eosinophils Relative: 0 %
HCT: 33.5 % — ABNORMAL LOW (ref 39.0–52.0)
Hemoglobin: 12 g/dL — ABNORMAL LOW (ref 13.0–17.0)
Immature Granulocytes: 1 %
Lymphocytes Relative: 12 %
Lymphs Abs: 0.4 10*3/uL — ABNORMAL LOW (ref 0.7–4.0)
MCH: 34.9 pg — ABNORMAL HIGH (ref 26.0–34.0)
MCHC: 35.8 g/dL (ref 30.0–36.0)
MCV: 97.4 fL (ref 80.0–100.0)
Monocytes Absolute: 0.6 10*3/uL (ref 0.1–1.0)
Monocytes Relative: 16 %
Neutro Abs: 2.4 10*3/uL (ref 1.7–7.7)
Neutrophils Relative %: 70 %
Platelet Count: 173 10*3/uL (ref 150–400)
RBC: 3.44 MIL/uL — ABNORMAL LOW (ref 4.22–5.81)
RDW: 14.2 % (ref 11.5–15.5)
WBC Count: 3.5 10*3/uL — ABNORMAL LOW (ref 4.0–10.5)
nRBC: 0 % (ref 0.0–0.2)

## 2019-12-24 MED ORDER — FAMOTIDINE IN NACL 20-0.9 MG/50ML-% IV SOLN
20.0000 mg | Freq: Once | INTRAVENOUS | Status: AC
  Administered 2019-12-24: 20 mg via INTRAVENOUS

## 2019-12-24 MED ORDER — PALONOSETRON HCL INJECTION 0.25 MG/5ML
0.2500 mg | Freq: Once | INTRAVENOUS | Status: AC
  Administered 2019-12-24: 0.25 mg via INTRAVENOUS

## 2019-12-24 MED ORDER — SODIUM CHLORIDE 0.9 % IV SOLN
45.0000 mg/m2 | Freq: Once | INTRAVENOUS | Status: AC
  Administered 2019-12-24: 90 mg via INTRAVENOUS
  Filled 2019-12-24: qty 15

## 2019-12-24 MED ORDER — FAMOTIDINE IN NACL 20-0.9 MG/50ML-% IV SOLN
INTRAVENOUS | Status: AC
  Filled 2019-12-24: qty 50

## 2019-12-24 MED ORDER — SODIUM CHLORIDE 0.9 % IV SOLN
200.4000 mg | Freq: Once | INTRAVENOUS | Status: AC
  Administered 2019-12-24: 200 mg via INTRAVENOUS
  Filled 2019-12-24: qty 20

## 2019-12-24 MED ORDER — SODIUM CHLORIDE 0.9 % IV SOLN
Freq: Once | INTRAVENOUS | Status: AC
  Filled 2019-12-24: qty 250

## 2019-12-24 MED ORDER — DIPHENHYDRAMINE HCL 50 MG/ML IJ SOLN
50.0000 mg | Freq: Once | INTRAMUSCULAR | Status: AC
  Administered 2019-12-24: 50 mg via INTRAVENOUS

## 2019-12-24 MED ORDER — DIPHENHYDRAMINE HCL 50 MG/ML IJ SOLN
INTRAMUSCULAR | Status: AC
  Filled 2019-12-24: qty 1

## 2019-12-24 MED ORDER — PALONOSETRON HCL INJECTION 0.25 MG/5ML
INTRAVENOUS | Status: AC
  Filled 2019-12-24: qty 5

## 2019-12-24 MED ORDER — SODIUM CHLORIDE 0.9 % IV SOLN
20.0000 mg | Freq: Once | INTRAVENOUS | Status: AC
  Administered 2019-12-24: 20 mg via INTRAVENOUS
  Filled 2019-12-24: qty 20

## 2019-12-24 NOTE — Progress Notes (Signed)
[facility] Telephone:([phone]   [phone]  OFFICE PROGRESS NOTE  Lujean Amel, MD [address] [place] [address]  DIAGNOSIS: stage IIIA/B (T3, N1/N2, M0)non-small cell lung cancer, adenocarcinoma. He presented with a right upper lobe lung mass with direct extension across the major fissure into the superior segment of the right lower lobe and ipsilateral hilar lymphadenopathy. There is questionable subcarinal lymphadenopathy versus esophageal hypermetabolism. The patient was diagnosed in May 2021.  Biomarker Findings Microsatellite status - MS-Stable Tumor Mutational Burden - 9 Muts/Mb Genomic Findings For a complete list of the genes assayed, please refer to the Appendix. KRAS G12C CDKN2A/B [id number] S12* NKX2-1 amplification - equivocal? PMS2 R3Q TP53 P56f*38 7 Disease relevant genes with no reportable alterations: ALK, BRAF, EGFR, ERBB2, MET, RET, ROS1  PDL1: 70%  PRIOR THERAPY: None  CURRENT THERAPY: Weekly concurrent chemoradiation with carboplatin for an AUC of 2 and paclitaxel 45 mg/m. First dose expected on 11/26/2019. Status post 4 cycles.   INTERVAL HISTORY: Christopher WIGINGTON89y.o. male returns to the clinic today for follow-up visit.  The patient is feeling fine today with no concerning complaints except for mild soreness in the mouth and fatigue.  He denied having any current chest pain, shortness of breath, cough or hemoptysis.  He denied having any fever or chills.  He has no nausea, vomiting, diarrhea or constipation.  He has no headache or visual changes.  He is here today for evaluation before starting cycle #5 of his treatment.  MEDICAL HISTORY: Past Medical History:  Diagnosis Date  . A-fib ([facility]    s/p DCCV 12/07/18  . Depression    situational  . Dysrhythmia   . Hypertension     ALLERGIES:  has No Known Allergies.  MEDICATIONS:  Current Outpatient Medications  Medication Sig  Dispense Refill  . amlodipine-benazepril (LOTREL) 2.5-10 MG capsule Take 1 capsule by mouth daily. 90 capsule 3  . ibuprofen (ADVIL) 200 MG tablet Take 400 mg by mouth every 8 (eight) hours as needed (for pain). (Patient not taking: Reported on 11/26/2019)    . metoprolol tartrate (LOPRESSOR) 50 MG tablet Take 50 mg by mouth 2 (two) times daily with a meal.    . prochlorperazine (COMPAZINE) 10 MG tablet Take 1 tablet (10 mg total) by mouth every 6 (six) hours as needed. (Patient not taking: Reported on 11/16/2019) 30 tablet 2  . sucralfate (CARAFATE) 1 g tablet Crush and mix in 1 oz water and drink 5 min TID before meals and at HS for radiation induced esophagitis 120 tablet 2  . XARELTO 20 MG TABS tablet Take 20 mg by mouth daily in the afternoon.      Current Facility-Administered Medications  Medication Dose Route Frequency Provider Last Rate Last Admin  . 0.9 %  sodium chloride infusion  500 mL Intravenous Continuous BThornton Park MD        SURGICAL HISTORY:  Past Surgical History:  Procedure Laterality Date  . CARDIOVERSION N/A 12/07/2018   Procedure: CARDIOVERSION;  Surgeon: NJosue Hector MD;  [facility];  Service: Cardiovascular;  Laterality: N/A;  . FINE NEEDLE ASPIRATION  10/30/2019   Procedure: FINE NEEDLE ASPIRATION (FNA) LINEAR;  Surgeon: SCandee Furbish MD;  [facility];  Service: Pulmonary;;  . INGUINAL HERNIA REPAIR Right 01/25/2019   Procedure: RIGHT INGUINAL HERNIA REPAIR WITH MESH;  Surgeon: BCoralie Keens MD;  [place] [facility]  Service: General;  Laterality: Right;  TAP BLOCK  . INSERTION OF MESH Right 01/25/2019   Procedure: Insertion Of Mesh;  Surgeon: Coralie Keens, MD;  Location: [facility];  Service: General;  Laterality: Right;  Marland Kitchen VIDEO BRONCHOSCOPY WITH ENDOBRONCHIAL ULTRASOUND N/A 10/30/2019   Procedure: VIDEO BRONCHOSCOPY WITH ENDOBRONCHIAL ULTRASOUND;  Surgeon: Candee Furbish, MD;  Location: [facility] ENDOSCOPY;  Service: Pulmonary;   Laterality: N/A;    REVIEW OF SYSTEMS:  A comprehensive review of systems was negative except for: Constitutional: positive for fatigue Ears, nose, mouth, throat, and face: positive for sore mouth   PHYSICAL EXAMINATION: General appearance: alert, cooperative, fatigued and no distress Head: Normocephalic, without obvious abnormality, atraumatic Neck: no adenopathy, no JVD, supple, symmetrical, trachea midline and thyroid not enlarged, symmetric, no tenderness/mass/nodules Lymph nodes: Cervical, supraclavicular, and axillary nodes normal. Resp: clear to auscultation bilaterally Back: symmetric, no curvature. ROM normal. No CVA tenderness. Cardio: regular rate and rhythm, S1, S2 normal, no murmur, click, rub or gallop GI: soft, non-tender; bowel sounds normal; no masses,  no organomegaly Extremities: extremities normal, atraumatic, no cyanosis or edema  ECOG PERFORMANCE STATUS: 1 - Symptomatic but completely ambulatory  Blood pressure 116/67, pulse 66, temperature 97.9 F (36.6 C), temperature source Temporal, resp. rate 18, height _0  (1.854 m), weight 165 lb 11.2 oz (75.2 kg), SpO2 100 %.  LABORATORY DATA: Lab Results  Component Value Date   WBC 3.5 (L) 12/24/2019   HGB 12.0 (L) 12/24/2019   HCT 33.5 (L) 12/24/2019   MCV 97.4 12/24/2019   PLT 173 12/24/2019      Chemistry      Component Value Date/Time   NA 130 (L) 12/24/2019 1148   NA 129 (L) 11/24/2018 1520   K 4.6 12/24/2019 1148   CL 100 12/24/2019 1148   CO2 22 12/24/2019 1148   BUN 8 12/24/2019 1148   BUN 8 11/24/2018 1520   CREATININE 0.71 12/24/2019 1148      Component Value Date/Time   CALCIUM 8.9 12/24/2019 1148   ALKPHOS 47 12/24/2019 1148   AST 14 (L) 12/24/2019 1148   ALT 7 12/24/2019 1148   BILITOT 0.5 12/24/2019 1148       RADIOGRAPHIC STUDIES: No results found.  ASSESSMENT AND PLAN: This is a very pleasant 69 years old white male with a stage IIIA/B (T3, N1/N2, M0) non-small cell lung cancer,  adenocarcinoma presented with right upper lobe lung mass with direct extension across the major fissure into the superior segment of the right lower lobe and ipsilateral hilar lymphadenopathy as well as questionable subcarinal lymphadenopathy. The patient is currently undergoing a course of concurrent chemoradiation with weekly carboplatin and paclitaxel status post 4 cycles.  He has been tolerating this treatment well with no concerning adverse effect except for mild soreness of his mouth as well as fatigue. I recommended for the patient to proceed with cycle #5 today as planned. He will come back for follow-up visit in 2 weeks for evaluation before starting cycle #7. For the suspicious of esophageal/subcarinal mass, the patient was supposed to follow with gastroenterology but this has been delayed because of scheduling issues. He was advised to call immediately if he has any other concerning symptoms in the interval. The patient voices understanding of current disease status and treatment options and is in agreement with the current care plan.  All questions were answered. The patient knows to call the clinic with any problems, questions or concerns. We can certainly see the patient much sooner if necessary.  The total time spent  in the appointment was 20 minutes.  Disclaimer: This note was dictated with voice recognition software. Similar sounding words can inadvertently be transcribed and may not be corrected upon review.

## 2019-12-24 NOTE — Patient Instructions (Signed)
[facility] Discharge Instructions for Patients Receiving Chemotherapy  Today you received the following chemotherapy agents: Paclitaxel and Carboplatin   To help prevent nausea and vomiting after your treatment, we encourage you to take your nausea medication as prescribed.    If you develop nausea and vomiting that is not controlled by your nausea medication, call the clinic.   BELOW ARE SYMPTOMS THAT SHOULD BE REPORTED IMMEDIATELY:  *FEVER GREATER THAN 100.5 F  *CHILLS WITH OR WITHOUT FEVER  NAUSEA AND VOMITING THAT IS NOT CONTROLLED WITH YOUR NAUSEA MEDICATION  *UNUSUAL SHORTNESS OF BREATH  *UNUSUAL BRUISING OR BLEEDING  TENDERNESS IN MOUTH AND THROAT WITH OR WITHOUT PRESENCE OF ULCERS  *URINARY PROBLEMS  *BOWEL PROBLEMS  UNUSUAL RASH Items with * indicate a potential emergency and should be followed up as soon as possible.  Feel free to call the clinic should you have any questions or concerns. The clinic phone number is [phone].  Please show the [place] at check-in to the Emergency Department and triage nurse.

## 2019-12-24 NOTE — Patient Instructions (Signed)
Steps to Quit Smoking Smoking tobacco is the leading cause of preventable death. It can affect almost every organ in the body. Smoking puts you and people around you at risk for many serious, long-lasting (chronic) diseases. Quitting smoking can be hard, but it is one of the best things that you can do for your health. It is never too late to quit. How do I get ready to quit? When you decide to quit smoking, make a plan to help you succeed. Before you quit:  Pick a date to quit. Set a date within the next 2 weeks to give you time to prepare.  Write down the reasons why you are quitting. Keep this list in places where you will see it often.  Tell your family, friends, and co-workers that you are quitting. Their support is important.  Talk with your doctor about the choices that may help you quit.  Find out if your health insurance will pay for these treatments.  Know the people, places, things, and activities that make you want to smoke (triggers). Avoid them. What first steps can I take to quit smoking?  Throw away all cigarettes at home, at work, and in your car.  Throw away the things that you use when you smoke, such as ashtrays and lighters.  Clean your car. Make sure to empty the ashtray.  Clean your home, including curtains and carpets. What can I do to help me quit smoking? Talk with your doctor about taking medicines and seeing a counselor at the same time. You are more likely to succeed when you do both.  If you are pregnant or breastfeeding, talk with your doctor about counseling or other ways to quit smoking. Do not take medicine to help you quit smoking unless your doctor tells you to do so. To quit smoking: Quit right away  Quit smoking totally, instead of slowly cutting back on how much you smoke over a period of time.  Go to counseling. You are more likely to quit if you go to counseling sessions regularly. Take medicine You may take medicines to help you quit. Some  medicines need a prescription, and some you can buy over-the-counter. Some medicines may contain a drug called nicotine to replace the nicotine in cigarettes. Medicines may:  Help you to stop having the desire to smoke (cravings).  Help to stop the problems that come when you stop smoking (withdrawal symptoms). Your doctor may ask you to use:  Nicotine patches, gum, or lozenges.  Nicotine inhalers or sprays.  Non-nicotine medicine that is taken by mouth. Find resources Find resources and other ways to help you quit smoking and remain smoke-free after you quit. These resources are most helpful when you use them often. They include:  Online chats with a counselor.  Phone quitlines.  Printed self-help materials.  Support groups or group counseling.  Text messaging programs.  Mobile phone apps. Use apps on your mobile phone or tablet that can help you stick to your quit plan. There are many free apps for mobile phones and tablets as well as websites. Examples include Quit Guide from the CDC and smokefree.gov  What things can I do to make it easier to quit?   Talk to your family and friends. Ask them to support and encourage you.  Call a phone quitline (1-800-QUIT-NOW), reach out to support groups, or work with a counselor.  Ask people who smoke to not smoke around you.  Avoid places that make you want to smoke,   such as: ? Bars. ? Parties. ? Smoke-break areas at work.  Spend time with people who do not smoke.  Lower the stress in your life. Stress can make you want to smoke. Try these things to help your stress: ? Getting regular exercise. ? Doing deep-breathing exercises. ? Doing yoga. ? Meditating. ? Doing a body scan. To do this, close your eyes, focus on one area of your body at a time from head to toe. Notice which parts of your body are tense. Try to relax the muscles in those areas. How will I feel when I quit smoking? Day 1 to 3 weeks Within the first 24 hours,  you may start to have some problems that come from quitting tobacco. These problems are very bad 2-3 days after you quit, but they do not often last for more than 2-3 weeks. You may get these symptoms:  Mood swings.  Feeling restless, nervous, angry, or annoyed.  Trouble concentrating.  Dizziness.  Strong desire for high-sugar foods and nicotine.  Weight gain.  Trouble pooping (constipation).  Feeling like you may vomit (nausea).  Coughing or a sore throat.  Changes in how the medicines that you take for other issues work in your body.  Depression.  Trouble sleeping (insomnia). Week 3 and afterward After the first 2-3 weeks of quitting, you may start to notice more positive results, such as:  Better sense of smell and taste.  Less coughing and sore throat.  Slower heart rate.  Lower blood pressure.  Clearer skin.  Better breathing.  Fewer sick days. Quitting smoking can be hard. Do not give up if you fail the first time. Some people need to try a few times before they succeed. Do your best to stick to your quit plan, and talk with your doctor if you have any questions or concerns. Summary  Smoking tobacco is the leading cause of preventable death. Quitting smoking can be hard, but it is one of the best things that you can do for your health.  When you decide to quit smoking, make a plan to help you succeed.  Quit smoking right away, not slowly over a period of time.  When you start quitting, seek help from your doctor, family, or friends. This information is not intended to replace advice given to you by your health care provider. Make sure you discuss any questions you have with your health care provider. Document Revised: 02/16/2019 Document Reviewed: 08/12/2018 Elsevier Patient Education  2020 [employer].  

## 2019-12-25 ENCOUNTER — Ambulatory Visit
Admission: RE | Admit: 2019-12-25 | Discharge: 2019-12-25 | Disposition: A | Discharge status: Home / Self Care | Payer: Medicare Other | Source: Ambulatory Visit | Attending: Radiation Oncology | Admitting: Radiation Oncology

## 2019-12-25 DIAGNOSIS — Z510 Encounter for antineoplastic radiation therapy: Secondary | ICD-10-CM | POA: Diagnosis not present

## 2019-12-25 DIAGNOSIS — C3481 Malignant neoplasm of overlapping sites of right bronchus and lung: Secondary | ICD-10-CM | POA: Diagnosis not present

## 2019-12-25 DIAGNOSIS — C3411 Malignant neoplasm of upper lobe, right bronchus or lung: Secondary | ICD-10-CM | POA: Diagnosis not present

## 2019-12-26 ENCOUNTER — Ambulatory Visit
Admission: RE | Admit: 2019-12-26 | Discharge: 2019-12-26 | Disposition: A | Discharge status: Home / Self Care | Payer: Medicare Other | Source: Ambulatory Visit | Attending: Radiation Oncology | Admitting: Radiation Oncology

## 2019-12-26 ENCOUNTER — Telehealth: Payer: Self-pay | Admitting: Internal Medicine

## 2019-12-26 ENCOUNTER — Other Ambulatory Visit: Payer: Self-pay

## 2019-12-26 DIAGNOSIS — Z510 Encounter for antineoplastic radiation therapy: Secondary | ICD-10-CM | POA: Diagnosis not present

## 2019-12-26 DIAGNOSIS — C3481 Malignant neoplasm of overlapping sites of right bronchus and lung: Secondary | ICD-10-CM | POA: Diagnosis not present

## 2019-12-26 DIAGNOSIS — C3411 Malignant neoplasm of upper lobe, right bronchus or lung: Secondary | ICD-10-CM | POA: Diagnosis not present

## 2019-12-26 NOTE — Telephone Encounter (Signed)
Scheduled per los. Called and left msg. Mailed printout  °

## 2019-12-27 ENCOUNTER — Other Ambulatory Visit: Payer: Self-pay

## 2019-12-27 ENCOUNTER — Ambulatory Visit
Admission: RE | Admit: 2019-12-27 | Discharge: 2019-12-27 | Disposition: A | Discharge status: Home / Self Care | Payer: Medicare Other | Source: Ambulatory Visit | Attending: Radiation Oncology | Admitting: Radiation Oncology

## 2019-12-27 DIAGNOSIS — C3481 Malignant neoplasm of overlapping sites of right bronchus and lung: Secondary | ICD-10-CM | POA: Diagnosis not present

## 2019-12-27 DIAGNOSIS — C3411 Malignant neoplasm of upper lobe, right bronchus or lung: Secondary | ICD-10-CM | POA: Diagnosis not present

## 2019-12-27 DIAGNOSIS — Z510 Encounter for antineoplastic radiation therapy: Secondary | ICD-10-CM | POA: Diagnosis not present

## 2019-12-28 ENCOUNTER — Ambulatory Visit
Admission: RE | Admit: 2019-12-28 | Discharge: 2019-12-28 | Disposition: A | Discharge status: Home / Self Care | Payer: Medicare Other | Source: Ambulatory Visit | Attending: Radiation Oncology | Admitting: Radiation Oncology

## 2019-12-28 DIAGNOSIS — C3411 Malignant neoplasm of upper lobe, right bronchus or lung: Secondary | ICD-10-CM | POA: Diagnosis not present

## 2019-12-28 DIAGNOSIS — Z510 Encounter for antineoplastic radiation therapy: Secondary | ICD-10-CM | POA: Diagnosis not present

## 2019-12-28 DIAGNOSIS — C3481 Malignant neoplasm of overlapping sites of right bronchus and lung: Secondary | ICD-10-CM | POA: Diagnosis not present

## 2019-12-31 ENCOUNTER — Other Ambulatory Visit: Payer: Self-pay

## 2019-12-31 ENCOUNTER — Inpatient Hospital Stay: Payer: Medicare Other

## 2019-12-31 ENCOUNTER — Ambulatory Visit
Admission: RE | Admit: 2019-12-31 | Discharge: 2019-12-31 | Disposition: A | Discharge status: Home / Self Care | Payer: Medicare Other | Source: Ambulatory Visit | Attending: Radiation Oncology | Admitting: Radiation Oncology

## 2019-12-31 VITALS — BP 117/71 | HR 60 | Temp 98.6°F | Resp 20 | Ht 73.0 in | Wt 163.5 lb

## 2019-12-31 DIAGNOSIS — Z510 Encounter for antineoplastic radiation therapy: Secondary | ICD-10-CM | POA: Diagnosis not present

## 2019-12-31 DIAGNOSIS — C3411 Malignant neoplasm of upper lobe, right bronchus or lung: Secondary | ICD-10-CM | POA: Diagnosis not present

## 2019-12-31 DIAGNOSIS — C3481 Malignant neoplasm of overlapping sites of right bronchus and lung: Secondary | ICD-10-CM | POA: Diagnosis not present

## 2019-12-31 DIAGNOSIS — Z5111 Encounter for antineoplastic chemotherapy: Secondary | ICD-10-CM | POA: Diagnosis not present

## 2019-12-31 DIAGNOSIS — I10 Essential (primary) hypertension: Secondary | ICD-10-CM | POA: Diagnosis not present

## 2019-12-31 DIAGNOSIS — Z79899 Other long term (current) drug therapy: Secondary | ICD-10-CM | POA: Diagnosis not present

## 2019-12-31 DIAGNOSIS — I4891 Unspecified atrial fibrillation: Secondary | ICD-10-CM | POA: Diagnosis not present

## 2019-12-31 DIAGNOSIS — C3491 Malignant neoplasm of unspecified part of right bronchus or lung: Secondary | ICD-10-CM

## 2019-12-31 DIAGNOSIS — C3431 Malignant neoplasm of lower lobe, right bronchus or lung: Secondary | ICD-10-CM | POA: Diagnosis not present

## 2019-12-31 DIAGNOSIS — Z7901 Long term (current) use of anticoagulants: Secondary | ICD-10-CM | POA: Diagnosis not present

## 2019-12-31 LAB — CBC WITH DIFFERENTIAL (CANCER CENTER ONLY)
Abs Immature Granulocytes: 0.02 10*3/uL (ref 0.00–0.07)
Basophils Absolute: 0 10*3/uL (ref 0.0–0.1)
Basophils Relative: 0 %
Eosinophils Absolute: 0 10*3/uL (ref 0.0–0.5)
Eosinophils Relative: 0 %
HCT: 31.4 % — ABNORMAL LOW (ref 39.0–52.0)
Hemoglobin: 11.4 g/dL — ABNORMAL LOW (ref 13.0–17.0)
Immature Granulocytes: 1 %
Lymphocytes Relative: 15 %
Lymphs Abs: 0.4 10*3/uL — ABNORMAL LOW (ref 0.7–4.0)
MCH: 35.3 pg — ABNORMAL HIGH (ref 26.0–34.0)
MCHC: 36.3 g/dL — ABNORMAL HIGH (ref 30.0–36.0)
MCV: 97.2 fL (ref 80.0–100.0)
Monocytes Absolute: 0.4 10*3/uL (ref 0.1–1.0)
Monocytes Relative: 16 %
Neutro Abs: 1.7 10*3/uL (ref 1.7–7.7)
Neutrophils Relative %: 68 %
Platelet Count: 139 10*3/uL — ABNORMAL LOW (ref 150–400)
RBC: 3.23 MIL/uL — ABNORMAL LOW (ref 4.22–5.81)
RDW: 14.7 % (ref 11.5–15.5)
WBC Count: 2.5 10*3/uL — ABNORMAL LOW (ref 4.0–10.5)
nRBC: 0 % (ref 0.0–0.2)

## 2019-12-31 LAB — CMP (CANCER CENTER ONLY)
ALT: 7 U/L (ref 0–44)
AST: 14 U/L — ABNORMAL LOW (ref 15–41)
Albumin: 3.4 g/dL — ABNORMAL LOW (ref 3.5–5.0)
Alkaline Phosphatase: 50 U/L (ref 38–126)
Anion gap: 9 (ref 5–15)
BUN: 9 mg/dL (ref 8–23)
CO2: 20 mmol/L — ABNORMAL LOW (ref 22–32)
Calcium: 9.3 mg/dL (ref 8.9–10.3)
Chloride: 99 mmol/L (ref 98–111)
Creatinine: 0.71 mg/dL (ref 0.61–1.24)
GFR, Est AFR Am: >60 mL/min (ref >60)
GFR, Estimated: >60 mL/min (ref >60)
Glucose, Bld: 105 mg/dL — ABNORMAL HIGH (ref 70–99)
Potassium: 4.5 mmol/L (ref 3.5–5.1)
Sodium: 128 mmol/L — ABNORMAL LOW (ref 135–145)
Total Bilirubin: 0.7 mg/dL (ref 0.3–1.2)
Total Protein: 6.7 g/dL (ref 6.5–8.1)

## 2019-12-31 MED ORDER — PALONOSETRON HCL INJECTION 0.25 MG/5ML
0.2500 mg | Freq: Once | INTRAVENOUS | Status: AC
  Administered 2019-12-31: 0.25 mg via INTRAVENOUS

## 2019-12-31 MED ORDER — FAMOTIDINE IN NACL 20-0.9 MG/50ML-% IV SOLN
20.0000 mg | Freq: Once | INTRAVENOUS | Status: AC
  Administered 2019-12-31: 20 mg via INTRAVENOUS

## 2019-12-31 MED ORDER — DIPHENHYDRAMINE HCL 50 MG/ML IJ SOLN
50.0000 mg | Freq: Once | INTRAMUSCULAR | Status: AC
  Administered 2019-12-31: 50 mg via INTRAVENOUS

## 2019-12-31 MED ORDER — SODIUM CHLORIDE 0.9 % IV SOLN
Freq: Once | INTRAVENOUS | Status: AC
  Filled 2019-12-31: qty 250

## 2019-12-31 MED ORDER — DIPHENHYDRAMINE HCL 50 MG/ML IJ SOLN
INTRAMUSCULAR | Status: AC
  Filled 2019-12-31: qty 1

## 2019-12-31 MED ORDER — SODIUM CHLORIDE 0.9 % IV SOLN
20.0000 mg | Freq: Once | INTRAVENOUS | Status: AC
  Administered 2019-12-31: 20 mg via INTRAVENOUS
  Filled 2019-12-31: qty 20

## 2019-12-31 MED ORDER — SODIUM CHLORIDE 0.9 % IV SOLN
200.4000 mg | Freq: Once | INTRAVENOUS | Status: AC
  Administered 2019-12-31: 200 mg via INTRAVENOUS
  Filled 2019-12-31: qty 20

## 2019-12-31 MED ORDER — FAMOTIDINE IN NACL 20-0.9 MG/50ML-% IV SOLN
INTRAVENOUS | Status: AC
  Filled 2019-12-31: qty 50

## 2019-12-31 MED ORDER — PALONOSETRON HCL INJECTION 0.25 MG/5ML
INTRAVENOUS | Status: AC
  Filled 2019-12-31: qty 5

## 2019-12-31 MED ORDER — SODIUM CHLORIDE 0.9 % IV SOLN
45.0000 mg/m2 | Freq: Once | INTRAVENOUS | Status: AC
  Administered 2019-12-31: 90 mg via INTRAVENOUS
  Filled 2019-12-31: qty 15

## 2019-12-31 NOTE — Patient Instructions (Signed)
Cancer Center Discharge Instructions for Patients Receiving Chemotherapy  Today you received the following chemotherapy agents Paclitaxel (TAXOL) & Carboplatin (PARAPLATIN).  To help prevent nausea and vomiting after your treatment, we encourage you to take your nausea medication as prescribed.  If you develop nausea and vomiting that is not controlled by your nausea medication, call the clinic.   BELOW ARE SYMPTOMS THAT SHOULD BE REPORTED IMMEDIATELY:  *FEVER GREATER THAN 100.5 F  *CHILLS WITH OR WITHOUT FEVER  NAUSEA AND VOMITING THAT IS NOT CONTROLLED WITH YOUR NAUSEA MEDICATION  *UNUSUAL SHORTNESS OF BREATH  *UNUSUAL BRUISING OR BLEEDING  TENDERNESS IN MOUTH AND THROAT WITH OR WITHOUT PRESENCE OF ULCERS  *URINARY PROBLEMS  *BOWEL PROBLEMS  UNUSUAL RASH Items with * indicate a potential emergency and should be followed up as soon as possible.  Feel free to call the clinic should you have any questions or concerns. The clinic phone number is [phone].  Please show the CHEMO ALERT CARD at check-in to the Emergency Department and triage nurse.   

## 2020-01-01 ENCOUNTER — Ambulatory Visit
Admission: RE | Admit: 2020-01-01 | Discharge: 2020-01-01 | Disposition: A | Discharge status: Home / Self Care | Payer: Medicare Other | Source: Ambulatory Visit | Attending: Radiation Oncology | Admitting: Radiation Oncology

## 2020-01-01 ENCOUNTER — Other Ambulatory Visit: Payer: Self-pay

## 2020-01-01 DIAGNOSIS — Z510 Encounter for antineoplastic radiation therapy: Secondary | ICD-10-CM | POA: Diagnosis not present

## 2020-01-01 DIAGNOSIS — C3481 Malignant neoplasm of overlapping sites of right bronchus and lung: Secondary | ICD-10-CM | POA: Diagnosis not present

## 2020-01-01 DIAGNOSIS — C3411 Malignant neoplasm of upper lobe, right bronchus or lung: Secondary | ICD-10-CM | POA: Diagnosis not present

## 2020-01-02 ENCOUNTER — Ambulatory Visit
Admission: RE | Admit: 2020-01-02 | Discharge: 2020-01-02 | Disposition: A | Discharge status: Home / Self Care | Payer: Medicare Other | Source: Ambulatory Visit | Attending: Radiation Oncology | Admitting: Radiation Oncology

## 2020-01-02 ENCOUNTER — Other Ambulatory Visit: Payer: Self-pay

## 2020-01-02 DIAGNOSIS — C3481 Malignant neoplasm of overlapping sites of right bronchus and lung: Secondary | ICD-10-CM | POA: Diagnosis not present

## 2020-01-02 DIAGNOSIS — Z510 Encounter for antineoplastic radiation therapy: Secondary | ICD-10-CM | POA: Diagnosis not present

## 2020-01-02 DIAGNOSIS — C3411 Malignant neoplasm of upper lobe, right bronchus or lung: Secondary | ICD-10-CM | POA: Diagnosis not present

## 2020-01-03 ENCOUNTER — Other Ambulatory Visit: Payer: Self-pay

## 2020-01-03 ENCOUNTER — Ambulatory Visit
Admission: RE | Admit: 2020-01-03 | Discharge: 2020-01-03 | Disposition: A | Discharge status: Home / Self Care | Payer: Medicare Other | Source: Ambulatory Visit | Attending: Radiation Oncology | Admitting: Radiation Oncology

## 2020-01-03 DIAGNOSIS — C3411 Malignant neoplasm of upper lobe, right bronchus or lung: Secondary | ICD-10-CM | POA: Diagnosis not present

## 2020-01-03 DIAGNOSIS — C3481 Malignant neoplasm of overlapping sites of right bronchus and lung: Secondary | ICD-10-CM | POA: Diagnosis not present

## 2020-01-03 DIAGNOSIS — Z510 Encounter for antineoplastic radiation therapy: Secondary | ICD-10-CM | POA: Diagnosis not present

## 2020-01-04 ENCOUNTER — Ambulatory Visit
Admission: RE | Admit: 2020-01-04 | Discharge: 2020-01-04 | Disposition: A | Discharge status: Home / Self Care | Payer: Medicare Other | Source: Ambulatory Visit | Attending: Radiation Oncology | Admitting: Radiation Oncology

## 2020-01-04 ENCOUNTER — Other Ambulatory Visit: Payer: Self-pay | Admitting: Physician Assistant

## 2020-01-04 ENCOUNTER — Other Ambulatory Visit: Payer: Self-pay

## 2020-01-04 DIAGNOSIS — C3481 Malignant neoplasm of overlapping sites of right bronchus and lung: Secondary | ICD-10-CM | POA: Diagnosis not present

## 2020-01-04 DIAGNOSIS — C3491 Malignant neoplasm of unspecified part of right bronchus or lung: Secondary | ICD-10-CM

## 2020-01-04 DIAGNOSIS — Z510 Encounter for antineoplastic radiation therapy: Secondary | ICD-10-CM | POA: Diagnosis not present

## 2020-01-04 DIAGNOSIS — C3411 Malignant neoplasm of upper lobe, right bronchus or lung: Secondary | ICD-10-CM | POA: Diagnosis not present

## 2020-01-07 ENCOUNTER — Inpatient Hospital Stay: Payer: Medicare Other | Attending: Physician Assistant | Admitting: Internal Medicine

## 2020-01-07 ENCOUNTER — Ambulatory Visit
Admission: RE | Admit: 2020-01-07 | Discharge: 2020-01-07 | Disposition: A | Discharge status: Home / Self Care | Payer: Medicare Other | Source: Ambulatory Visit | Attending: Radiation Oncology | Admitting: Radiation Oncology

## 2020-01-07 ENCOUNTER — Encounter: Payer: Self-pay | Admitting: Internal Medicine

## 2020-01-07 ENCOUNTER — Telehealth: Payer: Self-pay | Admitting: *Deleted

## 2020-01-07 ENCOUNTER — Inpatient Hospital Stay: Payer: Medicare Other

## 2020-01-07 ENCOUNTER — Other Ambulatory Visit: Payer: Self-pay

## 2020-01-07 VITALS — BP 104/60 | HR 64 | Temp 97.5°F | Resp 17 | Ht 73.0 in | Wt 163.1 lb

## 2020-01-07 DIAGNOSIS — C3411 Malignant neoplasm of upper lobe, right bronchus or lung: Secondary | ICD-10-CM | POA: Diagnosis not present

## 2020-01-07 DIAGNOSIS — I10 Essential (primary) hypertension: Secondary | ICD-10-CM | POA: Diagnosis not present

## 2020-01-07 DIAGNOSIS — Z79899 Other long term (current) drug therapy: Secondary | ICD-10-CM | POA: Diagnosis not present

## 2020-01-07 DIAGNOSIS — E860 Dehydration: Secondary | ICD-10-CM | POA: Diagnosis not present

## 2020-01-07 DIAGNOSIS — C3481 Malignant neoplasm of overlapping sites of right bronchus and lung: Secondary | ICD-10-CM | POA: Insufficient documentation

## 2020-01-07 DIAGNOSIS — Z5111 Encounter for antineoplastic chemotherapy: Secondary | ICD-10-CM | POA: Diagnosis not present

## 2020-01-07 DIAGNOSIS — C3431 Malignant neoplasm of lower lobe, right bronchus or lung: Secondary | ICD-10-CM | POA: Insufficient documentation

## 2020-01-07 DIAGNOSIS — C3490 Malignant neoplasm of unspecified part of unspecified bronchus or lung: Secondary | ICD-10-CM

## 2020-01-07 DIAGNOSIS — C3491 Malignant neoplasm of unspecified part of right bronchus or lung: Secondary | ICD-10-CM | POA: Diagnosis not present

## 2020-01-07 DIAGNOSIS — Z510 Encounter for antineoplastic radiation therapy: Secondary | ICD-10-CM | POA: Insufficient documentation

## 2020-01-07 LAB — CBC WITH DIFFERENTIAL (CANCER CENTER ONLY)
Abs Immature Granulocytes: 0.02 10*3/uL (ref 0.00–0.07)
Basophils Absolute: 0 10*3/uL (ref 0.0–0.1)
Basophils Relative: 1 %
Eosinophils Absolute: 0 10*3/uL (ref 0.0–0.5)
Eosinophils Relative: 1 %
HCT: 28.6 % — ABNORMAL LOW (ref 39.0–52.0)
Hemoglobin: 10.4 g/dL — ABNORMAL LOW (ref 13.0–17.0)
Immature Granulocytes: 1 %
Lymphocytes Relative: 15 %
Lymphs Abs: 0.3 10*3/uL — ABNORMAL LOW (ref 0.7–4.0)
MCH: 35.5 pg — ABNORMAL HIGH (ref 26.0–34.0)
MCHC: 36.4 g/dL — ABNORMAL HIGH (ref 30.0–36.0)
MCV: 97.6 fL (ref 80.0–100.0)
Monocytes Absolute: 0.4 10*3/uL (ref 0.1–1.0)
Monocytes Relative: 20 %
Neutro Abs: 1.3 10*3/uL — ABNORMAL LOW (ref 1.7–7.7)
Neutrophils Relative %: 62 %
Platelet Count: 146 10*3/uL — ABNORMAL LOW (ref 150–400)
RBC: 2.93 MIL/uL — ABNORMAL LOW (ref 4.22–5.81)
RDW: 15.8 % — ABNORMAL HIGH (ref 11.5–15.5)
WBC Count: 2 10*3/uL — ABNORMAL LOW (ref 4.0–10.5)
nRBC: 0 % (ref 0.0–0.2)

## 2020-01-07 LAB — CMP (CANCER CENTER ONLY)
ALT: <6 U/L (ref 0–44)
AST: 14 U/L — ABNORMAL LOW (ref 15–41)
Albumin: 3.4 g/dL — ABNORMAL LOW (ref 3.5–5.0)
Alkaline Phosphatase: 58 U/L (ref 38–126)
Anion gap: 8 (ref 5–15)
BUN: 8 mg/dL (ref 8–23)
CO2: 22 mmol/L (ref 22–32)
Calcium: 9.6 mg/dL (ref 8.9–10.3)
Chloride: 99 mmol/L (ref 98–111)
Creatinine: 0.72 mg/dL (ref 0.61–1.24)
GFR, Est AFR Am: >60 mL/min (ref >60)
GFR, Estimated: >60 mL/min (ref >60)
Glucose, Bld: 92 mg/dL (ref 70–99)
Potassium: 4.3 mmol/L (ref 3.5–5.1)
Sodium: 129 mmol/L — ABNORMAL LOW (ref 135–145)
Total Bilirubin: 0.6 mg/dL (ref 0.3–1.2)
Total Protein: 6.8 g/dL (ref 6.5–8.1)

## 2020-01-07 MED ORDER — PALONOSETRON HCL INJECTION 0.25 MG/5ML
0.2500 mg | Freq: Once | INTRAVENOUS | Status: AC
  Administered 2020-01-07: 0.25 mg via INTRAVENOUS

## 2020-01-07 MED ORDER — SODIUM CHLORIDE 0.9 % IV SOLN
45.0000 mg/m2 | Freq: Once | INTRAVENOUS | Status: AC
  Administered 2020-01-07: 90 mg via INTRAVENOUS
  Filled 2020-01-07: qty 15

## 2020-01-07 MED ORDER — DIPHENHYDRAMINE HCL 50 MG/ML IJ SOLN
50.0000 mg | Freq: Once | INTRAMUSCULAR | Status: AC
  Administered 2020-01-07: 50 mg via INTRAVENOUS

## 2020-01-07 MED ORDER — PALONOSETRON HCL INJECTION 0.25 MG/5ML
INTRAVENOUS | Status: AC
  Filled 2020-01-07: qty 5

## 2020-01-07 MED ORDER — SODIUM CHLORIDE 0.9 % IV SOLN
20.0000 mg | Freq: Once | INTRAVENOUS | Status: AC
  Administered 2020-01-07: 20 mg via INTRAVENOUS
  Filled 2020-01-07: qty 20

## 2020-01-07 MED ORDER — FAMOTIDINE IN NACL 20-0.9 MG/50ML-% IV SOLN
INTRAVENOUS | Status: AC
  Filled 2020-01-07: qty 50

## 2020-01-07 MED ORDER — DIPHENHYDRAMINE HCL 50 MG/ML IJ SOLN
INTRAMUSCULAR | Status: AC
  Filled 2020-01-07: qty 1

## 2020-01-07 MED ORDER — SODIUM CHLORIDE 0.9 % IV SOLN
200.4000 mg | Freq: Once | INTRAVENOUS | Status: AC
  Administered 2020-01-07: 200 mg via INTRAVENOUS
  Filled 2020-01-07: qty 20

## 2020-01-07 MED ORDER — SODIUM CHLORIDE 0.9 % IV SOLN
Freq: Once | INTRAVENOUS | Status: AC
  Filled 2020-01-07: qty 250

## 2020-01-07 MED ORDER — FAMOTIDINE IN NACL 20-0.9 MG/50ML-% IV SOLN
20.0000 mg | Freq: Once | INTRAVENOUS | Status: AC
  Administered 2020-01-07: 20 mg via INTRAVENOUS

## 2020-01-07 NOTE — Progress Notes (Signed)
Ok to treat today with labs and [place] per MD Julien Nordmann

## 2020-01-07 NOTE — Telephone Encounter (Signed)
I received a call from patient's wife. She wants to understand the plan after tx and when he will get scans and follow up.  I listened as he explained.  I updated her that patient sees Dr. Julien Nordmann today after his chemo. She states yes but wants to make sure the patient asks the right questions. I updated Dr. Julien Nordmann. Ms. Marriott states she will circle back with me about the appt.

## 2020-01-07 NOTE — Patient Instructions (Signed)
[facility] Discharge Instructions for Patients Receiving Chemotherapy  Today you received the following chemotherapy agents Paclitaxel (TAXOL) & Carboplatin (PARAPLATIN).  To help prevent nausea and vomiting after your treatment, we encourage you to take your nausea medication as prescribed.  If you develop nausea and vomiting that is not controlled by your nausea medication, call the clinic.   BELOW ARE SYMPTOMS THAT SHOULD BE REPORTED IMMEDIATELY:  *FEVER GREATER THAN 100.5 F  *CHILLS WITH OR WITHOUT FEVER  NAUSEA AND VOMITING THAT IS NOT CONTROLLED WITH YOUR NAUSEA MEDICATION  *UNUSUAL SHORTNESS OF BREATH  *UNUSUAL BRUISING OR BLEEDING  TENDERNESS IN MOUTH AND THROAT WITH OR WITHOUT PRESENCE OF ULCERS  *URINARY PROBLEMS  *BOWEL PROBLEMS  UNUSUAL RASH Items with * indicate a potential emergency and should be followed up as soon as possible.  Feel free to call the clinic should you have any questions or concerns. The clinic phone number is [phone].  Please show the CHEMO ALERT CARD at check-in to the Emergency Department and triage nurse.   

## 2020-01-07 NOTE — Patient Instructions (Signed)
Steps to Quit Smoking Smoking tobacco is the leading cause of preventable death. It can affect almost every organ in the body. Smoking puts you and people around you at risk for many serious, long-lasting (chronic) diseases. Quitting smoking can be hard, but it is one of the best things that you can do for your health. It is never too late to quit. How do I get ready to quit? When you decide to quit smoking, make a plan to help you succeed. Before you quit:  Pick a date to quit. Set a date within the next 2 weeks to give you time to prepare.  Write down the reasons why you are quitting. Keep this list in places where you will see it often.  Tell your family, friends, and co-workers that you are quitting. Their support is important.  Talk with your doctor about the choices that may help you quit.  Find out if your health insurance will pay for these treatments.  Know the people, places, things, and activities that make you want to smoke (triggers). Avoid them. What first steps can I take to quit smoking?  Throw away all cigarettes at home, at work, and in your car.  Throw away the things that you use when you smoke, such as ashtrays and lighters.  Clean your car. Make sure to empty the ashtray.  Clean your home, including curtains and carpets. What can I do to help me quit smoking? Talk with your doctor about taking medicines and seeing a counselor at the same time. You are more likely to succeed when you do both.  If you are pregnant or breastfeeding, talk with your doctor about counseling or other ways to quit smoking. Do not take medicine to help you quit smoking unless your doctor tells you to do so. To quit smoking: Quit right away  Quit smoking totally, instead of slowly cutting back on how much you smoke over a period of time.  Go to counseling. You are more likely to quit if you go to counseling sessions regularly. Take medicine You may take medicines to help you quit. Some  medicines need a prescription, and some you can buy over-the-counter. Some medicines may contain a drug called nicotine to replace the nicotine in cigarettes. Medicines may:  Help you to stop having the desire to smoke (cravings).  Help to stop the problems that come when you stop smoking (withdrawal symptoms). Your doctor may ask you to use:  Nicotine patches, gum, or lozenges.  Nicotine inhalers or sprays.  Non-nicotine medicine that is taken by mouth. Find resources Find resources and other ways to help you quit smoking and remain smoke-free after you quit. These resources are most helpful when you use them often. They include:  Online chats with a counselor.  Phone quitlines.  Printed self-help materials.  Support groups or group counseling.  Text messaging programs.  Mobile phone apps. Use apps on your mobile phone or tablet that can help you stick to your quit plan. There are many free apps for mobile phones and tablets as well as websites. Examples include Quit Guide from the CDC and smokefree.gov  What things can I do to make it easier to quit?   Talk to your family and friends. Ask them to support and encourage you.  Call a phone quitline (1-800-QUIT-NOW), reach out to support groups, or work with a counselor.  Ask people who smoke to not smoke around you.  Avoid places that make you want to smoke,   such as: ? Bars. ? Parties. ? Smoke-break areas at work.  Spend time with people who do not smoke.  Lower the stress in your life. Stress can make you want to smoke. Try these things to help your stress: ? Getting regular exercise. ? Doing deep-breathing exercises. ? Doing yoga. ? Meditating. ? Doing a body scan. To do this, close your eyes, focus on one area of your body at a time from head to toe. Notice which parts of your body are tense. Try to relax the muscles in those areas. How will I feel when I quit smoking? Day 1 to 3 weeks Within the first 24 hours,  you may start to have some problems that come from quitting tobacco. These problems are very bad 2-3 days after you quit, but they do not often last for more than 2-3 weeks. You may get these symptoms:  Mood swings.  Feeling restless, nervous, angry, or annoyed.  Trouble concentrating.  Dizziness.  Strong desire for high-sugar foods and nicotine.  Weight gain.  Trouble pooping (constipation).  Feeling like you may vomit (nausea).  Coughing or a sore throat.  Changes in how the medicines that you take for other issues work in your body.  Depression.  Trouble sleeping (insomnia). Week 3 and afterward After the first 2-3 weeks of quitting, you may start to notice more positive results, such as:  Better sense of smell and taste.  Less coughing and sore throat.  Slower heart rate.  Lower blood pressure.  Clearer skin.  Better breathing.  Fewer sick days. Quitting smoking can be hard. Do not give up if you fail the first time. Some people need to try a few times before they succeed. Do your best to stick to your quit plan, and talk with your doctor if you have any questions or concerns. Summary  Smoking tobacco is the leading cause of preventable death. Quitting smoking can be hard, but it is one of the best things that you can do for your health.  When you decide to quit smoking, make a plan to help you succeed.  Quit smoking right away, not slowly over a period of time.  When you start quitting, seek help from your doctor, family, or friends. This information is not intended to replace advice given to you by your health care provider. Make sure you discuss any questions you have with your health care provider. Document Revised: 02/16/2019 Document Reviewed: 08/12/2018 Elsevier Patient Education  2020 [employer].  

## 2020-01-07 NOTE — Progress Notes (Signed)
[place] [place] [phone]   [phone]  OFFICE PROGRESS NOTE  Lujean Amel, MD [address] [place] [address]  DIAGNOSIS: stage IIIA/B (T3, N1/N2, M0)non-small cell lung cancer, adenocarcinoma. He presented with a right upper lobe lung mass with direct extension across the major fissure into the superior segment of the right lower lobe and ipsilateral hilar lymphadenopathy. There is questionable subcarinal lymphadenopathy versus esophageal hypermetabolism. The patient was diagnosed in May 2021.  Biomarker Findings Microsatellite status - MS-Stable Tumor Mutational Burden - 9 Muts/Mb Genomic Findings For a complete list of the genes assayed, please refer to the Appendix. KRAS G12C CDKN2A/B p16INK4a S12* NKX2-1 amplification - equivocal? PMS2 R3Q TP53 P10f*38 7 Disease relevant genes with no reportable alterations: ALK, BRAF, EGFR, ERBB2, MET, RET, ROS1  PDL1: 70%  PRIOR THERAPY: None  CURRENT THERAPY: Weekly concurrent chemoradiation with carboplatin for an AUC of 2 and paclitaxel 45 mg/m. First dose expected on 11/26/2019. Status post 6 cycles.   INTERVAL HISTORY: Christopher [age]MOND89y.o. male returns to the clinic today for follow-up visit.  The patient is feeling fine today with no concerning complaints except for mild sore throat with swallowing.  He denied having any current chest pain, shortness of breath, cough or hemoptysis.  He denied having any fever or chills.  He has no nausea, vomiting, diarrhea or constipation.  He denied having any headache or visual changes.  The patient continues to tolerate his course of concurrent chemoradiation fairly well.  He is here today for evaluation before starting cycle #7.   MEDICAL HISTORY: Past Medical History:  Diagnosis Date  . A-fib ([facility]    s/p DCCV 12/07/18  . Depression    situational  . Dysrhythmia   . Hypertension     ALLERGIES:  has No Known  Allergies.  MEDICATIONS:  Current Outpatient Medications  Medication Sig Dispense Refill  . amlodipine-benazepril (LOTREL) 2.5-10 MG capsule Take 1 capsule by mouth daily. 90 capsule 3  . ibuprofen (ADVIL) 200 MG tablet Take 400 mg by mouth every 8 (eight) hours as needed (for pain). (Patient not taking: Reported on 11/26/2019)    . metoprolol tartrate (LOPRESSOR) 50 MG tablet Take 50 mg by mouth 2 (two) times daily with a meal.    . prochlorperazine (COMPAZINE) 10 MG tablet Take 1 tablet (10 mg total) by mouth every 6 (six) hours as needed. (Patient not taking: Reported on 11/16/2019) 30 tablet 2  . sucralfate (CARAFATE) 1 g tablet Crush and mix in 1 oz water and drink 5 min TID before meals and at HS for radiation induced esophagitis 120 tablet 2  . XARELTO 20 MG TABS tablet Take 20 mg by mouth daily in the afternoon.      Current Facility-Administered Medications  Medication Dose Route Frequency Provider Last Rate Last Admin  . 0.9 %  sodium chloride infusion  500 mL Intravenous Continuous BThornton Park MD        SURGICAL HISTORY:  Past Surgical History:  Procedure Laterality Date  . CARDIOVERSION N/A 12/07/2018   Procedure: CARDIOVERSION;  Surgeon: NJosue Hector MD;  [facility];  Service: Cardiovascular;  Laterality: N/A;  . FINE NEEDLE ASPIRATION  10/30/2019   Procedure: FINE NEEDLE ASPIRATION (FNA) LINEAR;  Surgeon: SCandee Furbish MD;  [facility];  Service: Pulmonary;;  . INGUINAL HERNIA REPAIR Right 01/25/2019   Procedure: RIGHT INGUINAL HERNIA REPAIR WITH MESH;  Surgeon: BCoralie Keens MD;  Location: [facility] OR;  Service: General;  Laterality: Right;  TAP BLOCK  . INSERTION OF MESH Right 01/25/2019   Procedure: Insertion Of Mesh;  Surgeon: Coralie Keens, MD;  Location: [facility];  Service: General;  Laterality: Right;  Marland Kitchen VIDEO BRONCHOSCOPY WITH ENDOBRONCHIAL ULTRASOUND N/A 10/30/2019   Procedure: VIDEO BRONCHOSCOPY WITH ENDOBRONCHIAL ULTRASOUND;  Surgeon:  Candee Furbish, MD;  [facility];  Service: Pulmonary;  Laterality: N/A;    REVIEW OF SYSTEMS:  A comprehensive review of systems was negative except for: Constitutional: positive for fatigue Ears, nose, mouth, throat, and face: positive for sore mouth   PHYSICAL EXAMINATION: General appearance: alert, cooperative, fatigued and no distress Head: Normocephalic, without obvious abnormality, atraumatic Neck: no adenopathy, no JVD, supple, symmetrical, trachea midline and thyroid not enlarged, symmetric, no tenderness/mass/nodules Lymph nodes: Cervical, supraclavicular, and axillary nodes normal. Resp: clear to auscultation bilaterally Back: symmetric, no curvature. ROM normal. No CVA tenderness. Cardio: regular rate and rhythm, S1, S2 normal, no murmur, click, rub or gallop GI: soft, non-tender; bowel sounds normal; no masses,  no organomegaly Extremities: extremities normal, atraumatic, no cyanosis or edema  ECOG PERFORMANCE STATUS: 1 - Symptomatic but completely ambulatory  Blood pressure 104/60, pulse 64, temperature (!) 97.5 F (36.4 C), temperature source Temporal, resp. rate 17, height '6\' 1"'$  (1.854 m), weight 163 lb 1.6 oz (74 kg), SpO2 100 %.  LABORATORY DATA: Lab Results  Component Value Date   WBC 2.0 (L) 01/07/2020   HGB 10.4 (L) 01/07/2020   HCT 28.6 (L) 01/07/2020   MCV 97.6 01/07/2020   PLT 146 (L) 01/07/2020      Chemistry      Component Value Date/Time   NA 129 (L) 01/07/2020 1106   NA 129 (L) 11/24/2018 1520   K 4.3 01/07/2020 1106   CL 99 01/07/2020 1106   CO2 22 01/07/2020 1106   BUN 8 01/07/2020 1106   BUN 8 11/24/2018 1520   CREATININE 0.72 01/07/2020 1106      Component Value Date/Time   CALCIUM 9.6 01/07/2020 1106   ALKPHOS 58 01/07/2020 1106   AST 14 (L) 01/07/2020 1106   ALT <6 01/07/2020 1106   BILITOT 0.6 01/07/2020 1106       RADIOGRAPHIC STUDIES: No results found.  ASSESSMENT AND PLAN: This is a very pleasant 69 years old  white male with a stage IIIA/B (T3, N1/N2, M0) non-small cell lung cancer, adenocarcinoma presented with right upper lobe lung mass with direct extension across the major fissure into the superior segment of the right lower lobe and ipsilateral hilar lymphadenopathy as well as questionable subcarinal lymphadenopathy. The patient is currently undergoing a course of concurrent chemoradiation with weekly carboplatin and paclitaxel status post 6 cycles.   The patient continues to tolerate his treatment well with no concerning adverse effect except for mild sore mouth and throat. I recommended for him to proceed with cycle #7 today as planned. I will see him back for follow-up visit in 4 weeks for evaluation with repeat CT scan of the chest for restaging of his disease. For the lower esophageal mass, I strongly recommend for the patient to follow-up with gastroenterology for evaluation and he is scheduled to be seen on January 26, 2020. The patient was advised to call immediately if he has any concerning symptoms in the interval. The patient voices understanding of current disease status and treatment options and is in agreement with the current care plan.  All questions were answered. The patient knows to call  the clinic with any problems, questions or concerns. We can certainly see the patient much sooner if necessary.  Disclaimer: This note was dictated with voice recognition software. Similar sounding words can inadvertently be transcribed and may not be corrected upon review.

## 2020-01-08 ENCOUNTER — Other Ambulatory Visit: Payer: Self-pay

## 2020-01-08 ENCOUNTER — Ambulatory Visit: Payer: Medicare Other

## 2020-01-08 ENCOUNTER — Ambulatory Visit
Admission: RE | Admit: 2020-01-08 | Discharge: 2020-01-08 | Disposition: A | Discharge status: Home / Self Care | Payer: Medicare Other | Source: Ambulatory Visit | Attending: Radiation Oncology | Admitting: Radiation Oncology

## 2020-01-08 DIAGNOSIS — C3411 Malignant neoplasm of upper lobe, right bronchus or lung: Secondary | ICD-10-CM | POA: Diagnosis not present

## 2020-01-08 DIAGNOSIS — Z510 Encounter for antineoplastic radiation therapy: Secondary | ICD-10-CM | POA: Diagnosis not present

## 2020-01-08 DIAGNOSIS — C3481 Malignant neoplasm of overlapping sites of right bronchus and lung: Secondary | ICD-10-CM | POA: Diagnosis not present

## 2020-01-09 ENCOUNTER — Ambulatory Visit
Admission: RE | Admit: 2020-01-09 | Discharge: 2020-01-09 | Disposition: A | Discharge status: Home / Self Care | Payer: Medicare Other | Source: Ambulatory Visit | Attending: Radiation Oncology | Admitting: Radiation Oncology

## 2020-01-09 ENCOUNTER — Other Ambulatory Visit: Payer: Self-pay

## 2020-01-09 ENCOUNTER — Telehealth: Payer: Self-pay | Admitting: Internal Medicine

## 2020-01-09 ENCOUNTER — Ambulatory Visit: Payer: Medicare Other

## 2020-01-09 DIAGNOSIS — C3481 Malignant neoplasm of overlapping sites of right bronchus and lung: Secondary | ICD-10-CM | POA: Diagnosis not present

## 2020-01-09 DIAGNOSIS — Z510 Encounter for antineoplastic radiation therapy: Secondary | ICD-10-CM | POA: Diagnosis not present

## 2020-01-09 DIAGNOSIS — C3411 Malignant neoplasm of upper lobe, right bronchus or lung: Secondary | ICD-10-CM | POA: Diagnosis not present

## 2020-01-09 NOTE — Telephone Encounter (Signed)
Scheduled per los. Called and spoke with [place]. Confirmed appt

## 2020-01-10 ENCOUNTER — Ambulatory Visit
Admission: RE | Admit: 2020-01-10 | Discharge: 2020-01-10 | Disposition: A | Discharge status: Home / Self Care | Payer: Medicare Other | Source: Ambulatory Visit | Attending: Radiation Oncology | Admitting: Radiation Oncology

## 2020-01-10 ENCOUNTER — Ambulatory Visit: Payer: Medicare Other

## 2020-01-10 ENCOUNTER — Other Ambulatory Visit: Payer: Self-pay

## 2020-01-10 DIAGNOSIS — C3481 Malignant neoplasm of overlapping sites of right bronchus and lung: Secondary | ICD-10-CM | POA: Diagnosis not present

## 2020-01-10 DIAGNOSIS — Z510 Encounter for antineoplastic radiation therapy: Secondary | ICD-10-CM | POA: Diagnosis not present

## 2020-01-10 DIAGNOSIS — C3411 Malignant neoplasm of upper lobe, right bronchus or lung: Secondary | ICD-10-CM | POA: Diagnosis not present

## 2020-01-11 ENCOUNTER — Other Ambulatory Visit: Payer: Self-pay

## 2020-01-11 ENCOUNTER — Encounter: Payer: Self-pay | Admitting: Radiation Oncology

## 2020-01-11 ENCOUNTER — Ambulatory Visit
Admission: RE | Admit: 2020-01-11 | Discharge: 2020-01-11 | Disposition: A | Discharge status: Home / Self Care | Payer: Medicare Other | Source: Ambulatory Visit | Attending: Radiation Oncology | Admitting: Radiation Oncology

## 2020-01-11 DIAGNOSIS — Z510 Encounter for antineoplastic radiation therapy: Secondary | ICD-10-CM | POA: Diagnosis not present

## 2020-01-11 DIAGNOSIS — C3411 Malignant neoplasm of upper lobe, right bronchus or lung: Secondary | ICD-10-CM | POA: Diagnosis not present

## 2020-01-11 DIAGNOSIS — C3481 Malignant neoplasm of overlapping sites of right bronchus and lung: Secondary | ICD-10-CM | POA: Diagnosis not present

## 2020-01-14 ENCOUNTER — Ambulatory Visit: Payer: Medicare Other

## 2020-01-14 ENCOUNTER — Other Ambulatory Visit: Payer: Medicare Other

## 2020-01-16 ENCOUNTER — Telehealth: Payer: Self-pay | Admitting: Medical Oncology

## 2020-01-16 ENCOUNTER — Other Ambulatory Visit: Payer: Self-pay | Admitting: Medical Oncology

## 2020-01-16 ENCOUNTER — Inpatient Hospital Stay: Payer: Medicare Other

## 2020-01-16 ENCOUNTER — Other Ambulatory Visit: Payer: Self-pay

## 2020-01-16 ENCOUNTER — Ambulatory Visit: Payer: Medicare Other

## 2020-01-16 ENCOUNTER — Inpatient Hospital Stay (HOSPITAL_BASED_OUTPATIENT_CLINIC_OR_DEPARTMENT_OTHER): Payer: Medicare Other | Admitting: Medical

## 2020-01-16 ENCOUNTER — Ambulatory Visit: Payer: Medicare Other | Admitting: Medical

## 2020-01-16 VITALS — BP 109/95 | HR 57 | Temp 98.0°F | Resp 16 | Ht 73.0 in | Wt 160.8 lb

## 2020-01-16 DIAGNOSIS — E860 Dehydration: Secondary | ICD-10-CM

## 2020-01-16 DIAGNOSIS — C3491 Malignant neoplasm of unspecified part of right bronchus or lung: Secondary | ICD-10-CM

## 2020-01-16 DIAGNOSIS — Z79899 Other long term (current) drug therapy: Secondary | ICD-10-CM | POA: Diagnosis not present

## 2020-01-16 DIAGNOSIS — R1314 Dysphagia, pharyngoesophageal phase: Secondary | ICD-10-CM | POA: Diagnosis not present

## 2020-01-16 DIAGNOSIS — C3431 Malignant neoplasm of lower lobe, right bronchus or lung: Secondary | ICD-10-CM | POA: Diagnosis not present

## 2020-01-16 DIAGNOSIS — E871 Hypo-osmolality and hyponatremia: Secondary | ICD-10-CM | POA: Diagnosis not present

## 2020-01-16 DIAGNOSIS — C3411 Malignant neoplasm of upper lobe, right bronchus or lung: Secondary | ICD-10-CM | POA: Diagnosis not present

## 2020-01-16 DIAGNOSIS — Z5111 Encounter for antineoplastic chemotherapy: Secondary | ICD-10-CM | POA: Diagnosis not present

## 2020-01-16 LAB — CBC WITH DIFFERENTIAL (CANCER CENTER ONLY)
Abs Immature Granulocytes: 0.05 10*3/uL (ref 0.00–0.07)
Basophils Absolute: 0 10*3/uL (ref 0.0–0.1)
Basophils Relative: 0 %
Eosinophils Absolute: 0 10*3/uL (ref 0.0–0.5)
Eosinophils Relative: 0 %
HCT: 26.1 % — ABNORMAL LOW (ref 39.0–52.0)
Hemoglobin: 9.4 g/dL — ABNORMAL LOW (ref 13.0–17.0)
Immature Granulocytes: 2 %
Lymphocytes Relative: 7 %
Lymphs Abs: 0.2 10*3/uL — ABNORMAL LOW (ref 0.7–4.0)
MCH: 35.9 pg — ABNORMAL HIGH (ref 26.0–34.0)
MCHC: 36 g/dL (ref 30.0–36.0)
MCV: 99.6 fL (ref 80.0–100.0)
Monocytes Absolute: 0.7 10*3/uL (ref 0.1–1.0)
Monocytes Relative: 21 %
Neutro Abs: 2.3 10*3/uL (ref 1.7–7.7)
Neutrophils Relative %: 70 %
Platelet Count: 195 10*3/uL (ref 150–400)
RBC: 2.62 MIL/uL — ABNORMAL LOW (ref 4.22–5.81)
RDW: 18 % — ABNORMAL HIGH (ref 11.5–15.5)
WBC Count: 3.3 10*3/uL — ABNORMAL LOW (ref 4.0–10.5)
nRBC: 0 % (ref 0.0–0.2)

## 2020-01-16 LAB — CMP (CANCER CENTER ONLY)
ALT: <6 U/L (ref 0–44)
AST: 13 U/L — ABNORMAL LOW (ref 15–41)
Albumin: 3.2 g/dL — ABNORMAL LOW (ref 3.5–5.0)
Alkaline Phosphatase: 69 U/L (ref 38–126)
Anion gap: 10 (ref 5–15)
BUN: 11 mg/dL (ref 8–23)
CO2: 19 mmol/L — ABNORMAL LOW (ref 22–32)
Calcium: 9.3 mg/dL (ref 8.9–10.3)
Chloride: 96 mmol/L — ABNORMAL LOW (ref 98–111)
Creatinine: 0.77 mg/dL (ref 0.61–1.24)
GFR, Est AFR Am: >60 mL/min (ref >60)
GFR, Estimated: >60 mL/min (ref >60)
Glucose, Bld: 101 mg/dL — ABNORMAL HIGH (ref 70–99)
Potassium: 4.5 mmol/L (ref 3.5–5.1)
Sodium: 125 mmol/L — ABNORMAL LOW (ref 135–145)
Total Bilirubin: 0.8 mg/dL (ref 0.3–1.2)
Total Protein: 6.9 g/dL (ref 6.5–8.1)

## 2020-01-16 LAB — SAMPLE TO BLOOD BANK

## 2020-01-16 MED ORDER — SODIUM CHLORIDE 0.9 % IV SOLN
Freq: Once | INTRAVENOUS | Status: AC
  Filled 2020-01-16: qty 250

## 2020-01-16 MED ORDER — LIDOCAINE VISCOUS HCL 2 % MT SOLN
15.0000 mL | OROMUCOSAL | 0 refills | Status: DC | PRN
Start: 2020-01-16 — End: 2020-04-22

## 2020-01-16 NOTE — Patient Instructions (Signed)

## 2020-01-16 NOTE — Telephone Encounter (Signed)
Symptomatic-  Weakness, Fatigued, bedbound , developed fever last night <101.5 Completed 8 weeks chemo /xrt last week. Appt with [facility] today

## 2020-01-18 NOTE — Progress Notes (Signed)
Sympt[facility] Management Clinic Progress Note   Christopher Harding [id number] 1951/03/30 69 y.o.  Christopher Harding is managed by Dr. Fanny Bien. Christopher Harding  Actively treated with chemotherapy/immunotherapy/hormonal therapy: The patient is status post chemoradiation with his last chemo dosed on 01/07/2020 and his last radiation dose on 01/10/2020.  Next scheduled appointment with provider: 02/07/2020  Assessment: Plan:    Adenocarcinoma of right lung, stage 3 (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  He is scheduled to be seen in follow-up on 02/07/2020 with restaging CT scans completed prior to that visit.  It was recommended Christopher Harding that he stay out of work for 1 additional week and then begin back part-time for 1 to 2 weeks until his energy level rebounds.  Dehydration: The patient was given 1 L of normal saline IV today.  Dysphagia: The patient was given a prescription for viscous lidocaine.  He was told to continue using sucralfate.  Hyponatremia: Patient was told to begin salt tablets but elects to begin drinking V8 daily as V8 has a higher level of sodium and will raise his sodium slowly.  His sodium level returned at 125 today.  Please see After Visit Summary for patient specific instructions.  Future Appointments  Date Time Provider [place]  01/30/2020  3:00 PM Thornton Park, MD [facility]  02/05/2020 11:30 AM [facility] None  02/05/2020 12:30 PM [facility] 2 [facility] [place]  02/07/2020 11:30 AM Curt Bears, MD [facility] None    Orders Placed This Encounter  Procedures  . Culture, Blood  . Culture, Blood       Subjective:   Patient ID:  Christopher Harding is a 69 y.o. (DOB 05-23-1951) male.  Chief Complaint: No chief complaint on file.   HPI Christopher Harding is a 69 y.o. male with a diagnosis of a stage III adenocarcinoma of the right lung. He is followed by Dr. Julien Nordmann and is status post chemoradiation with his last chemotherapy dosed on 01/07/2020 and his last radiation therapy dosed on 01/10/2020.  His last 3 radiation treatments for boost treatments.  He presents to the clinic today with his wife.  He is having anorexia, fatigue, and dysphagia since his last 3 radiation treatments.  He reports that he was doing fine until that point.  He had been able to eat and had no difficulty swallowing prior to his last 3 radiation treatments.  He denies fevers, chills, sweats, nausea, or vomiting.   Medications: I have reviewed the patient's current medications.  Allergies: No Known Allergies  Past Medical History:  Diagnosis Date  . A-fib [facility])    s/p DCCV 12/07/18  . Depression    situational  . Dysrhythmia   . Hypertension     Past Surgical History:  Procedure Laterality Date  . CARDIOVERSION N/A 12/07/2018   Procedure: CARDIOVERSION;  Surgeon: Josue Hector, MD;  Location: [facility] ENDOSCOPY;  Service: Cardiovascular;  Laterality: N/A;  . FINE NEEDLE ASPIRATION  10/30/2019   Procedure: FINE NEEDLE ASPIRATION (FNA) LINEAR;  Surgeon: Candee Furbish, MD;  Location: [facility] ENDOSCOPY;  Service: Pulmonary;;  . INGUINAL HERNIA REPAIR Right 01/25/2019   Procedure: RIGHT INGUINAL HERNIA REPAIR WITH MESH;  Surgeon: Coralie Keens, MD;  Location: [facility] OR;  Service: General;  Laterality: Right;  TAP BLOCK  . INSERTION OF MESH Right 01/25/2019   Procedure: Insertion Of Mesh;  Surgeon: Coralie Keens, MD;  Location: [place];  Service: General;  Laterality: Right;  Marland Kitchen VIDEO BRONCHOSCOPY WITH ENDOBRONCHIAL ULTRASOUND N/A 10/30/2019   Procedure: VIDEO BRONCHOSCOPY WITH ENDOBRONCHIAL ULTRASOUND;  Surgeon: Candee Furbish, MD;  Location: [facility] ENDOSCOPY;   Service: Pulmonary;  Laterality: N/A;    Family History  Problem Relation Age of Onset  . Dementia Mother   . Alzheimer's disease Mother   . Hypertension Mother   . Stroke Father   . Clotting disorder Father     Social History   Socioeconomic History  . Marital status: Married    Spouse name: Not on file  . Number of children: 2  . Years of education: Not on file  . Highest education level: Not on file  Occupational History  . Occupation: retired  Tobacco Use  . Smoking status: Current Every Day Smoker    Packs/day: 0.50    Years: 45.00    Pack years: 22.50    Types: Cigarettes  . Smokeless tobacco: Never Used  Vaping Use  . Vaping Use: Never used  Substance and Sexual Activity  . Alcohol use: Yes    Alcohol/week: 50.0 standard drinks    Types: 50 Cans of beer per week  . Drug use: No  . Sexual activity: Not on file  Other Topics Concern  . Not on file  Social History Narrative  . Not on file   Social Determinants of Health   Financial Resource Strain:   . Difficulty of Paying Living Expenses:   Food Insecurity:   . Worried About Charity fundraiser in the Last Year:   . Arboriculturist in the Last Year:   Transportation Needs:   . Film/video editor (Medical):   Marland Kitchen Lack of Transportation (Non-Medical):   Physical Activity:   . Days of Exercise per Week:   . Minutes of Exercise per Session:   Stress:   . Feeling of Stress :   Social Connections:   . Frequency of Communication with Friends and Family:   . Frequency of Social Gatherings with Friends and Family:   . Attends Religious Services:   . Active Member of Clubs or Organizations:   . Attends Archivist Meetings:   Marland Kitchen Marital Status:   Intimate Partner Violence:   . Fear of Current or Ex-Partner:   . Emotionally Abused:   Marland Kitchen Physically Abused:   . Sexually Abused:     Past Medical History, Surgical history, Social history, and Family history were reviewed and updated as  appropriate.   Please see review of systems for further details on the patient's review from today.   Review of Systems:  Review of Systems  Constitutional: Positive for appetite change and fatigue. Negative for chills, diaphoresis, fever and unexpected weight change.  HENT: Positive for trouble swallowing.   Respiratory: Negative for cough, chest tightness and shortness of breath.   Cardiovascular: Negative for chest pain and palpitations.  Gastrointestinal: Negative for abdominal pain, constipation, diarrhea, nausea and vomiting.  Neurological: Negative for dizziness and headaches.    Objective:   Physical Exam:  BP (!) 109/95   Pulse (!) 57   Temp 98 F (36.7 C) (Oral)   Resp 16   Ht 6\' 1"  (1.854 m)   Wt 160 lb 12.8 oz (72.9 kg)   SpO2 100%  BMI 21.22 kg/m  ECOG: 0  Physical Exam Constitutional:      General: He is not in acute distress.    Appearance: He is not diaphoretic.  HENT:     Head: Normocephalic and atraumatic.  Eyes:     General: No scleral icterus.       Right eye: No discharge.        Left eye: No discharge.     Conjunctiva/sclera: Conjunctivae normal.  Cardiovascular:     Rate and Rhythm: Normal rate and regular rhythm.     Heart sounds: Normal heart sounds. No murmur heard.  No friction rub. No gallop.   Pulmonary:     Effort: Pulmonary effort is normal. No respiratory distress.     Breath sounds: Normal breath sounds. No wheezing or rales.  Skin:    General: Skin is warm and dry.     Findings: No erythema or rash.  Neurological:     Mental Status: He is alert.     Coordination: Coordination normal.     Gait: Gait normal.  Psychiatric:        Mood and Affect: Mood normal.        Behavior: Behavior normal.     Lab Review:     Component Value Date/Time   NA 125 (L) 01/16/2020 1348   NA 129 (L) 11/24/2018 1520   K 4.5 01/16/2020 1348   CL 96 (L) 01/16/2020 1348   CO2 19 (L) 01/16/2020 1348   GLUCOSE 101 (H) 01/16/2020 1348   BUN 11  01/16/2020 1348   BUN 8 11/24/2018 1520   CREATININE 0.77 01/16/2020 1348   CALCIUM 9.3 01/16/2020 1348   PROT 6.9 01/16/2020 1348   PROT 7.2 02/15/2017 1639   ALBUMIN 3.2 (L) 01/16/2020 1348   ALBUMIN 4.4 10/03/2017 1650   AST 13 (L) 01/16/2020 1348   ALT <6 01/16/2020 1348   ALKPHOS 69 01/16/2020 1348   BILITOT 0.8 01/16/2020 1348   GFRNONAA >60 01/16/2020 1348   GFRAA >60 01/16/2020 1348       Component Value Date/Time   WBC 3.3 (L) 01/16/2020 1348   WBC 5.6 10/30/2019 0558   RBC 2.62 (L) 01/16/2020 1348   HGB 9.4 (L) 01/16/2020 1348   HGB 15.2 11/24/2018 1520   HCT 26.1 (L) 01/16/2020 1348   HCT 43.5 11/24/2018 1520   PLT 195 01/16/2020 1348   PLT 221 11/24/2018 1520   MCV 99.6 01/16/2020 1348   MCV 102 (H) 11/24/2018 1520   MCH 35.9 (H) 01/16/2020 1348   MCHC 36.0 01/16/2020 1348   RDW 18.0 (H) 01/16/2020 1348   RDW 12.6 11/24/2018 1520   LYMPHSABS 0.2 (L) 01/16/2020 1348   LYMPHSABS 1.5 01/25/2018 1636   MONOABS 0.7 01/16/2020 1348   EOSABS 0.0 01/16/2020 1348   EOSABS 0.0 01/25/2018 1636   BASOSABS 0.0 01/16/2020 1348   BASOSABS 0.0 01/25/2018 1636   -------------------------------  Imaging from last 24 hours (if applicable):  Radiology interpretation: No results found.      This case was discussed with Dr. Julien Nordmann. He expressed agreement with my management of this patient.

## 2020-01-21 LAB — CULTURE, BLOOD (SINGLE)
Culture: NO GROWTH
Culture: NO GROWTH

## 2020-01-28 NOTE — Progress Notes (Signed)
  Radiation Oncology         [phone] ________________________________  Name: Christopher Harding MRN: [id number]  Date: 01/11/2020  DOB: 03-18-1951  End of Treatment Note  Diagnosis:  Lung cancer     Indication for treatment::  curative       Radiation treatment dates:   11/27/19 - 01/11/20  Site/dose:   The patient was treated to the disease within the right lung initially to a dose of 60 Gy using a 5 field, 3-D conformal technique. The patient then received a cone down boost treatment for an additional 6 Gy. This yielded a final total dose of 66 Gy.   Narrative: The patient tolerated radiation treatment relatively well.   The patient did well overall with symptomatic esophagitis.     Plan: The patient has completed radiation treatment. The patient will return to radiation oncology clinic for routine followup in one month. I advised the patient to call or return sooner if they have any questions or concerns related to their recovery or treatment. ________________________________  Jodelle Gross, M.D., Ph.D.

## 2020-01-29 ENCOUNTER — Telehealth: Payer: Self-pay | Admitting: Radiation Oncology

## 2020-01-29 NOTE — Telephone Encounter (Signed)
  Radiation Oncology         [phone]) [phone] ________________________________  Name: Christopher Harding MRN: [id number]  Date of Service: 01/29/2020  DOB: [birth year]  Post Treatment Telephone Note  Diagnosis: Stage III, cT3N1-2M0, NSCLC, adenocarcinoma of the posterior RUL extending into the RLL.  Interval Since Last Radiation:  4 weeks   11/27/19 - 01/11/20: The patient was treated to the disease within the right lung initially to a dose of 60 Gy using a 5 field, 3-D conformal technique. The patient then received a cone down boost treatment for an additional 6 Gy. This yielded a final total dose of 66 Gy.   Narrative:  The patient was contacted today for routine follow-up. During treatment she did very well with radiotherapy and did have moderate desquamation but did have esophagitis that was significant. He was seen by Dr. Tarri Glenn last week for endoscopy to follow up on a PET positive finding, but he did not have an esophageal mass, its felt that rather, the area of concern was a subcarinal node.  Impression/Plan: 1. 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 The patient has been doing better in the last week especially since completion of radiotherapy. I discusse with his wife by phone how he was doing. We discussed that we would be happy to continue to follow her as needed, but he will also continue to follow up with Dr. Julien Nordmann in medical oncology.     Carola Rhine, PAC

## 2020-01-30 ENCOUNTER — Telehealth: Payer: Self-pay | Admitting: Medical Oncology

## 2020-01-30 ENCOUNTER — Encounter: Payer: Self-pay | Admitting: Gastroenterology

## 2020-01-30 ENCOUNTER — Other Ambulatory Visit: Payer: Self-pay | Admitting: Radiation Oncology

## 2020-01-30 ENCOUNTER — Ambulatory Visit (AMBULATORY_SURGERY_CENTER): Payer: Medicare Other | Admitting: Gastroenterology

## 2020-01-30 ENCOUNTER — Other Ambulatory Visit: Payer: Self-pay

## 2020-01-30 VITALS — BP 104/64 | HR 62 | Temp 97.9°F | Resp 18 | Ht 73.0 in | Wt 168.0 lb

## 2020-01-30 DIAGNOSIS — K2081 Other esophagitis with bleeding: Secondary | ICD-10-CM | POA: Diagnosis not present

## 2020-01-30 DIAGNOSIS — K449 Diaphragmatic hernia without obstruction or gangrene: Secondary | ICD-10-CM

## 2020-01-30 DIAGNOSIS — R948 Abnormal results of function studies of other organs and systems: Secondary | ICD-10-CM | POA: Diagnosis not present

## 2020-01-30 MED ORDER — HYDROCODONE-ACETAMINOPHEN 7.5-325 MG/15ML PO SOLN
10.0000 mL | Freq: Four times a day (QID) | ORAL | 0 refills | Status: DC | PRN
Start: 2020-01-30 — End: 2020-04-22

## 2020-01-30 MED ORDER — GELCLAIR MT GEL: OROMUCOSAL | 1 refills | Status: DC

## 2020-01-30 MED ORDER — SODIUM CHLORIDE 0.9 % IV SOLN: 500.0000 mL | Freq: Once | INTRAVENOUS | Status: DC

## 2020-01-30 NOTE — Telephone Encounter (Signed)
terrible esophagitis" . Wife stated it was seen on endoscopy today .  Dr Tarri Glenn noted: "Severe esophagitis with bleeding was found in the mid esophagus consistent with radiation esophagitis.".  Wife asking for something for treatment. Forwarded to Dr Ida Rogue nurse.

## 2020-01-30 NOTE — Progress Notes (Signed)
Cw vitals and SM IV.

## 2020-01-30 NOTE — Progress Notes (Signed)
pt tolerated well. VSS. awake and to recovery. Report given to RN. Bite placed while awake and poor dentition noted. No complaint no trauma. Bite blocked removed gently no trauma.

## 2020-01-30 NOTE — Patient Instructions (Signed)
Handouts given for esophagitis and Hiatal Hernia.  YOU HAD AN ENDOSCOPIC PROCEDURE TODAY AT [facility]:   Refer to the procedure report that was given to you for any specific questions about what was found during the examination.  If the procedure report does not answer your questions, please call your gastroenterologist to clarify.  If you requested that your care partner not be given the details of your procedure findings, then the procedure report has been included in a sealed envelope for you to review at your convenience later.  YOU SHOULD EXPECT: Some feelings of bloating in the abdomen. Passage of more gas than usual.  Walking can help get rid of the air that was put into your GI tract during the procedure and reduce the bloating. If you had a lower endoscopy (such as a colonoscopy or flexible sigmoidoscopy) you may notice spotting of blood in your stool or on the toilet paper. If you underwent a bowel prep for your procedure, you may not have a normal bowel movement for a few days.  Please Note:  You might notice some irritation and congestion in your nose or some drainage.  This is from the oxygen used during your procedure.  There is no need for concern and it should clear up in a day or so.  SYMPTOMS TO REPORT IMMEDIATELY:   Following upper endoscopy (EGD)  Vomiting of blood or coffee ground material  New chest pain or pain under the shoulder blades  Painful or persistently difficult swallowing  New shortness of breath  Fever of 100F or higher  Black, tarry-looking stools  For urgent or emergent issues, a gastroenterologist can be reached at any hour by calling [phone]. Do not use MyChart messaging for urgent concerns.    DIET:  We do recommend a small meal at first, but then you may proceed to your regular diet.  Drink plenty of fluids but you should avoid alcoholic beverages for 24 hours.  ACTIVITY:  You should plan to take it easy for the rest of today  and you should NOT DRIVE or use heavy machinery until tomorrow (because of the sedation medicines used during the test).    FOLLOW UP: Our staff will call the number listed on your records 48-72 hours following your procedure to check on you and address any questions or concerns that you may have regarding the information given to you following your procedure. If we do not reach you, we will leave a message.  We will attempt to reach you two times.  During this call, we will ask if you have developed any symptoms of COVID 19. If you develop any symptoms (ie: fever, flu-like symptoms, shortness of breath, cough etc.) before then, please call [phone].  If you test positive for Covid 19 in the 2 weeks post procedure, please call and report this information to [place].    If any biopsies were taken you will be contacted by phone or by letter within the next 1-3 weeks.  Please call us at [phone] if you have not heard about the biopsies in 3 weeks.    SIGNATURES/CONFIDENTIALITY: You and/or your care partner have signed paperwork which will be entered into your electronic medical record.  These signatures attest to the fact that that the information above on your After Visit Summary has been reviewed and is understood.  Full responsibility of the confidentiality of this discharge information lies with you and/or your care-partner.

## 2020-01-30 NOTE — Op Note (Signed)
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 The patient's tolerance of                            previous anesthesia was also reviewed. The risks                            and benefits of the procedure and the sedation                            options and risks were discussed with the patient.                            All questions were answered, and informed consent                            was obtained. Prior Anticoagulants: The patient has                            taken Xarelto (rivaroxaban), last dose was 2 days                            prior to procedure. ASA Grade Assessment: III - A  patient with severe systemic disease. After                            reviewing the risks and benefits, the patient was                            deemed in satisfactory condition to undergo the                            procedure.                           After obtaining informed consent, the endoscope was                            passed under direct vision. Throughout the                            procedure, the  patient's blood pressure, pulse, and                            oxygen saturations were monitored continuously. The                            Endoscope was introduced through the mouth, and                            advanced to the second part of duodenum. The upper                            GI endoscopy was accomplished without difficulty.                            The patient tolerated the procedure well. Scope In: Scope Out: Findings:                 Severe esophagitis with bleeding was found in the                            mid esophagus consistent with radiation                            esophagitis. There was no obvious malginancy or                            mass seen in the esophagus, but, evaluation in the                            area of esophagitis is limited.                           The entire examined stomach was normal except for a                            small hiatal  hernia.                           The examined duodenum was normal. Complications:            No immediate complications. Estimated blood loss:                            Minimal. Estimated Blood Loss:     Estimated blood loss was minimal. Impression:               - Severe radiation esophagitis with bleeding.                           - Small hiatal hernia                           - Normal stomach.                           - Normal examined duodenum.                           - No specimens collected. Recommendation:           - Patient has a contact number available for                            emergencies. The signs and symptoms of potential                            delayed complications were discussed with the                            patient. Return to normal activities tomorrow.                            Written discharge instructions were provided to the                            patient.                           - Resume previous diet.                           - Continue  present medications. Management of                            radiation esophagitis per radiation oncology                            protocols.                           - Resume Xarelto today.                           - Follow-up in the office as needed. Thornton Park MD,  MD 01/30/2020 3:16:40 PM This report has been signed electronically.

## 2020-01-31 NOTE — Telephone Encounter (Signed)
Thanks we have sent in some new meds

## 2020-02-01 ENCOUNTER — Telehealth: Payer: Self-pay | Admitting: *Deleted

## 2020-02-01 NOTE — Telephone Encounter (Signed)
  Follow up Call-  Call back number 01/30/2020 11/23/2019  Post procedure Call Back phone  # [phone]  Permission to leave phone message Yes Yes  Some recent data might be hidden     Patient questions:  Do you have a fever, pain , or abdominal swelling? No. Pain Score  0 *  Have you tolerated food without any problems? Yes.    Have you been able to return to your normal activities? Yes.    Do you have any questions about your discharge instructions: Diet   No. Medications  No. Follow up visit  No.  Do you have questions or concerns about your Care? No.  Actions: * If pain score is 4 or above: 1. No action needed, pain <4.Have you developed a fever since your procedure? no  2.   Have you had an respiratory symptoms (SOB or cough) since your procedure? no  3.   Have you tested positive for COVID 19 since your procedure no  4.   Have you had any family members/close contacts diagnosed with the COVID 19 since your procedure?  no   If yes to any of these questions please route to Joylene John, RN and Joella Prince, RN

## 2020-02-05 ENCOUNTER — Other Ambulatory Visit: Payer: Self-pay

## 2020-02-05 ENCOUNTER — Encounter (HOSPITAL_COMMUNITY): Payer: Self-pay

## 2020-02-05 ENCOUNTER — Ambulatory Visit (HOSPITAL_COMMUNITY)
Admission: RE | Admit: 2020-02-05 | Discharge: 2020-02-05 | Disposition: A | Discharge status: Home / Self Care | Payer: Medicare Other | Source: Ambulatory Visit | Attending: Internal Medicine | Admitting: Internal Medicine

## 2020-02-05 ENCOUNTER — Inpatient Hospital Stay: Payer: Medicare Other

## 2020-02-05 DIAGNOSIS — C3490 Malignant neoplasm of unspecified part of unspecified bronchus or lung: Secondary | ICD-10-CM

## 2020-02-05 DIAGNOSIS — I2510 Atherosclerotic heart disease of native coronary artery without angina pectoris: Secondary | ICD-10-CM | POA: Diagnosis not present

## 2020-02-05 DIAGNOSIS — C3491 Malignant neoplasm of unspecified part of right bronchus or lung: Secondary | ICD-10-CM | POA: Diagnosis not present

## 2020-02-05 DIAGNOSIS — Z5111 Encounter for antineoplastic chemotherapy: Secondary | ICD-10-CM | POA: Diagnosis not present

## 2020-02-05 DIAGNOSIS — I700 Atherosclerosis of aorta: Secondary | ICD-10-CM | POA: Diagnosis not present

## 2020-02-05 DIAGNOSIS — J432 Centrilobular emphysema: Secondary | ICD-10-CM | POA: Diagnosis not present

## 2020-02-05 HISTORY — DX: Malignant (primary) neoplasm, unspecified: C80.1

## 2020-02-05 LAB — CMP (CANCER CENTER ONLY)
ALT: <6 U/L (ref 0–44)
AST: 17 U/L (ref 15–41)
Albumin: 3 g/dL — ABNORMAL LOW (ref 3.5–5.0)
Alkaline Phosphatase: 80 U/L (ref 38–126)
Anion gap: 7 (ref 5–15)
BUN: 8 mg/dL (ref 8–23)
CO2: 23 mmol/L (ref 22–32)
Calcium: 9.5 mg/dL (ref 8.9–10.3)
Chloride: 100 mmol/L (ref 98–111)
Creatinine: 0.74 mg/dL (ref 0.61–1.24)
GFR, Est AFR Am: >60 mL/min (ref >60)
GFR, Estimated: >60 mL/min (ref >60)
Glucose, Bld: 86 mg/dL (ref 70–99)
Potassium: 4.6 mmol/L (ref 3.5–5.1)
Sodium: 130 mmol/L — ABNORMAL LOW (ref 135–145)
Total Bilirubin: 0.5 mg/dL (ref 0.3–1.2)
Total Protein: 6.7 g/dL (ref 6.5–8.1)

## 2020-02-05 LAB — CBC WITH DIFFERENTIAL (CANCER CENTER ONLY)
Abs Immature Granulocytes: 0.05 10*3/uL (ref 0.00–0.07)
Basophils Absolute: 0.1 10*3/uL (ref 0.0–0.1)
Basophils Relative: 1 %
Eosinophils Absolute: 0.2 10*3/uL (ref 0.0–0.5)
Eosinophils Relative: 5 %
HCT: 27.8 % — ABNORMAL LOW (ref 39.0–52.0)
Hemoglobin: 9.7 g/dL — ABNORMAL LOW (ref 13.0–17.0)
Immature Granulocytes: 1 %
Lymphocytes Relative: 10 %
Lymphs Abs: 0.4 10*3/uL — ABNORMAL LOW (ref 0.7–4.0)
MCH: 35.3 pg — ABNORMAL HIGH (ref 26.0–34.0)
MCHC: 34.9 g/dL (ref 30.0–36.0)
MCV: 101.1 fL — ABNORMAL HIGH (ref 80.0–100.0)
Monocytes Absolute: 0.6 10*3/uL (ref 0.1–1.0)
Monocytes Relative: 14 %
Neutro Abs: 3.1 10*3/uL (ref 1.7–7.7)
Neutrophils Relative %: 69 %
Platelet Count: 223 10*3/uL (ref 150–400)
RBC: 2.75 MIL/uL — ABNORMAL LOW (ref 4.22–5.81)
RDW: 17.5 % — ABNORMAL HIGH (ref 11.5–15.5)
WBC Count: 4.5 10*3/uL (ref 4.0–10.5)
nRBC: 0 % (ref 0.0–0.2)

## 2020-02-05 IMAGING — CT CT CHEST W/ CM
2 of 4 series · 15 of 36 positions shown, 18 images · IV contrast (omnipaque)
Comparison: [DATE] PET-CT.  [DATE] chest CT.

CLINICAL DATA: Non-small cell right lung cancer completed
concurrent chemoradiation therapy [DATE]. Restaging.

EXAM:
CT CHEST WITH CONTRAST
TECHNIQUE: Multidetector CT imaging of the chest was performed during
intravenous contrast administration.
CONTRAST:  75mL OMNIPAQUE IOHEXOL 300 MG/ML  SOLN

[Series 2: axial st · axial · 0.86mm/px · z∈[-349,-19]mm · 12 of 195 slices shown, 15 images]
[im 15/195  mediastinal]
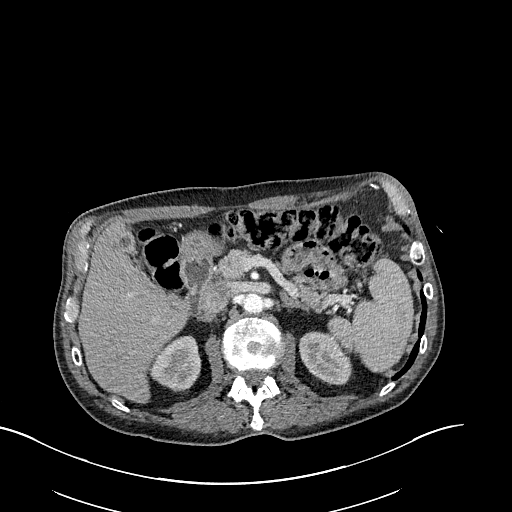
[im 15/195  lung]
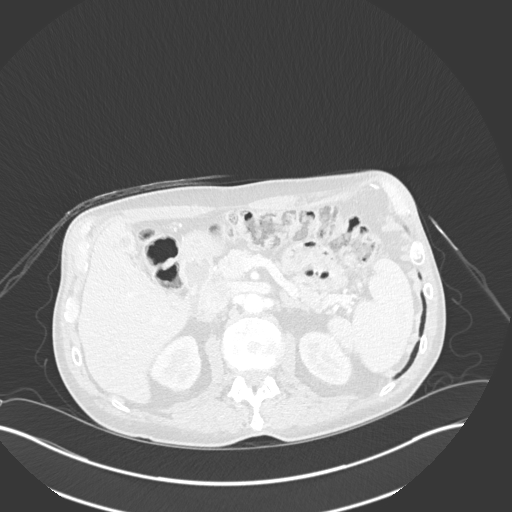
[im 30/195  lung]
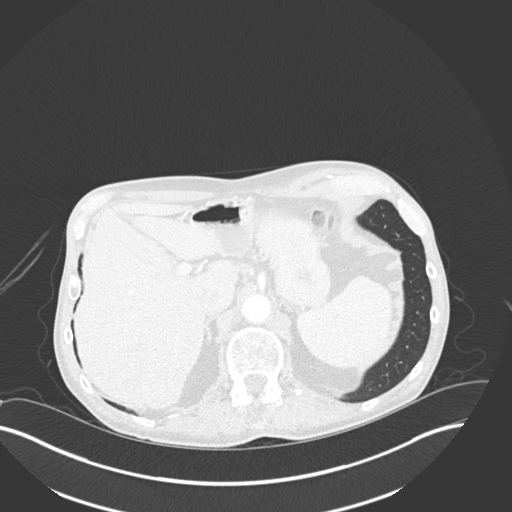
[im 45/195  lung]
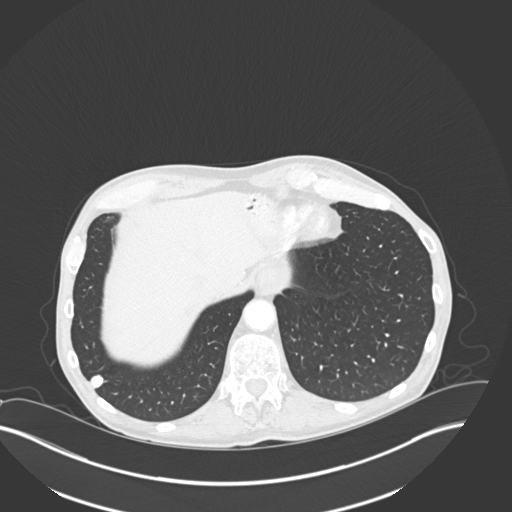
[im 60/195  lung]
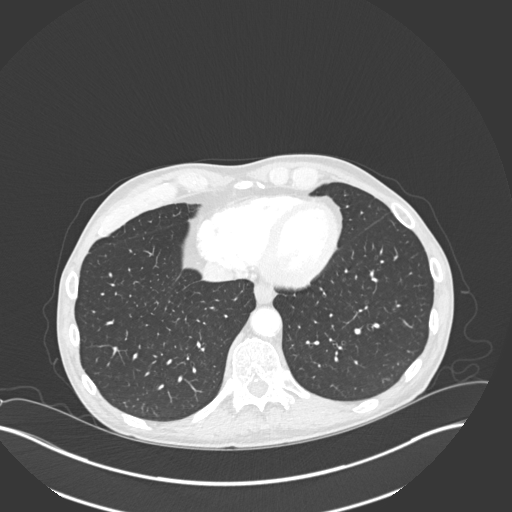
[im 75/195  mediastinal]
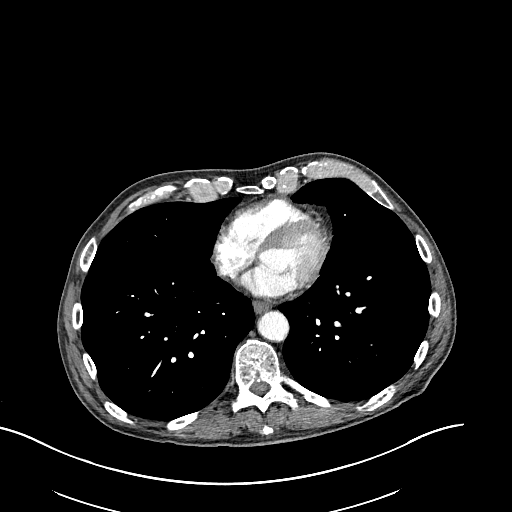
[im 75/195  lung]
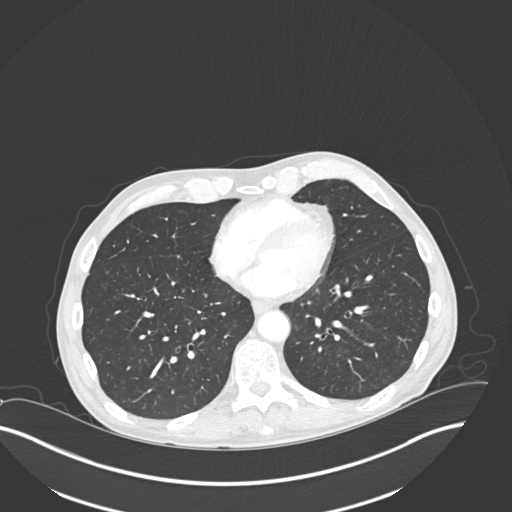
[im 90/195  lung]
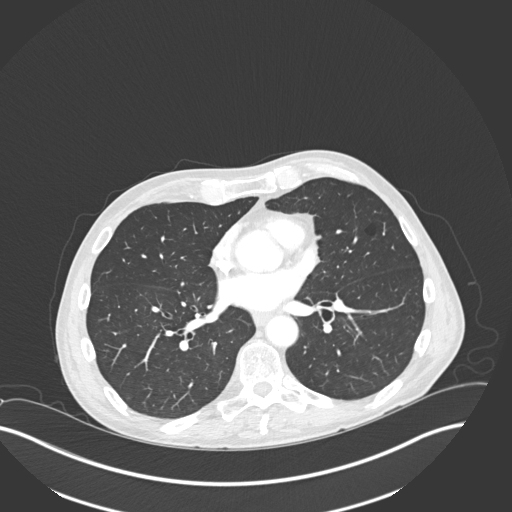
[im 105/195  lung]
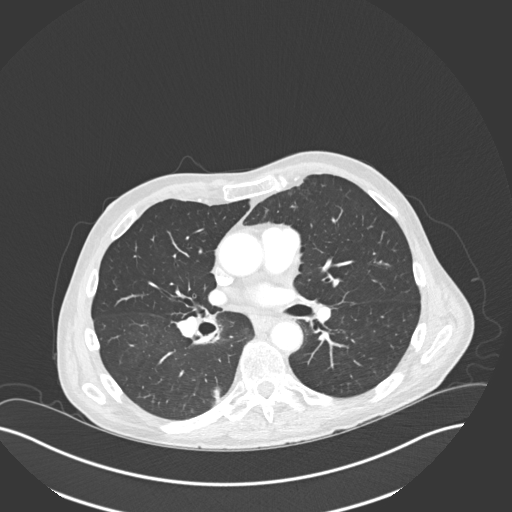
[im 120/195  lung]
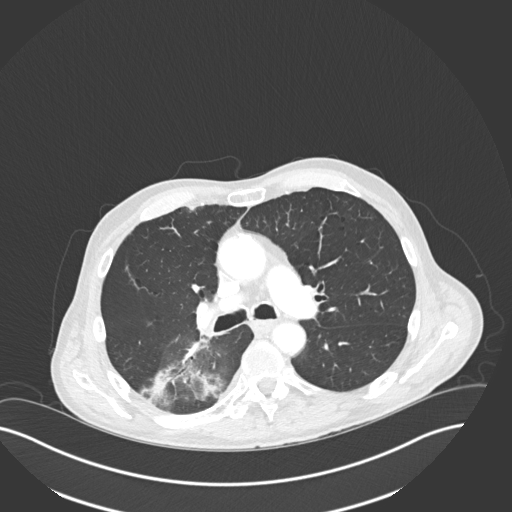
[im 135/195  mediastinal]
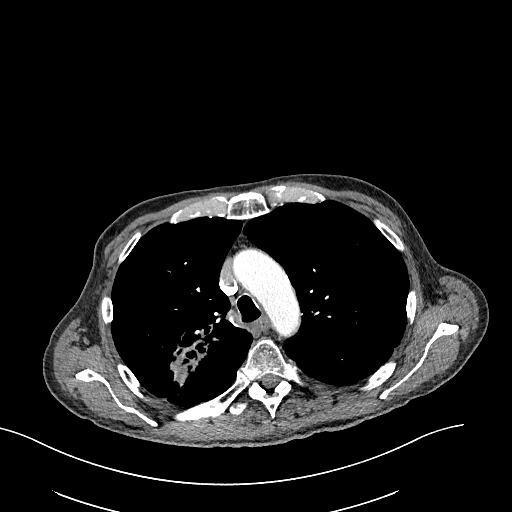
[im 135/195  lung]
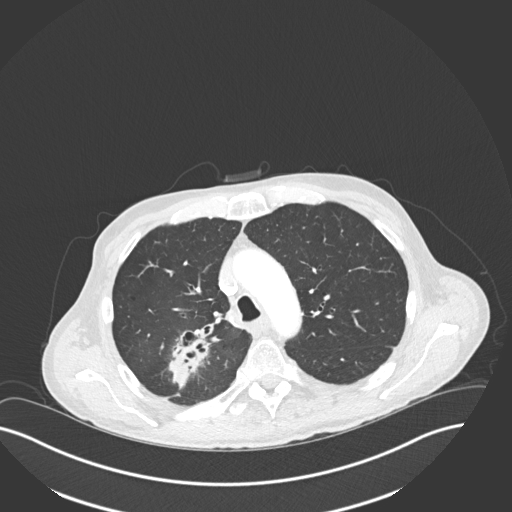
[im 150/195  lung]
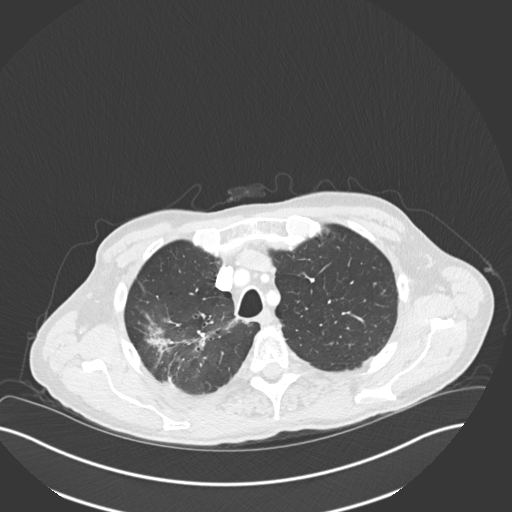
[im 165/195  lung]
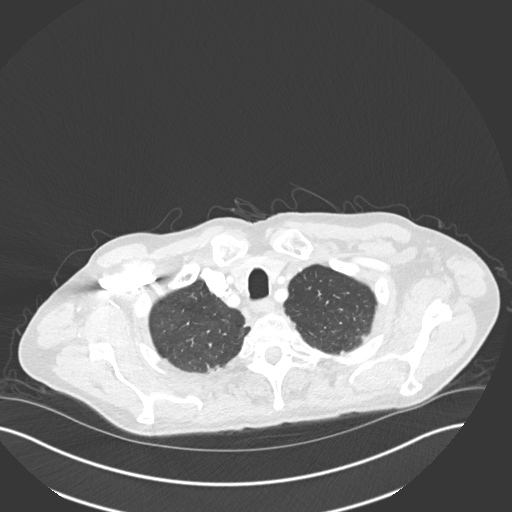
[im 180/195  lung]
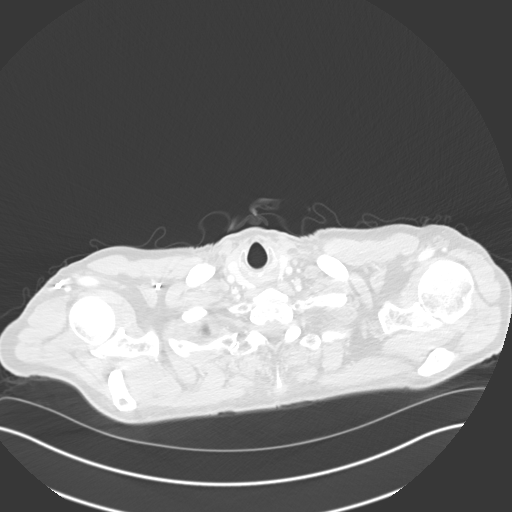

[Series 6: coronal · coronal · 0.78mm/px · 3 of 134 slices shown]
[im 27/134  lung]
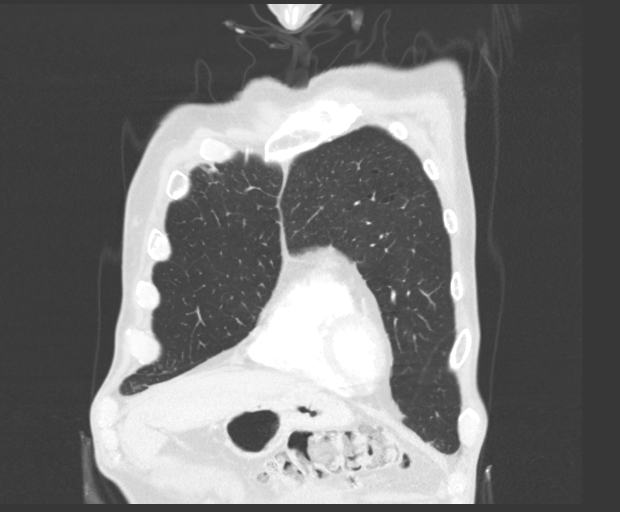
[im 54/134  lung]
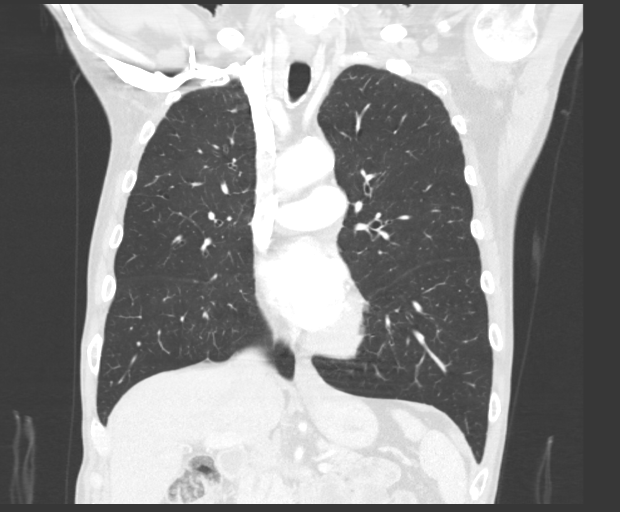
[im 80/134  lung]
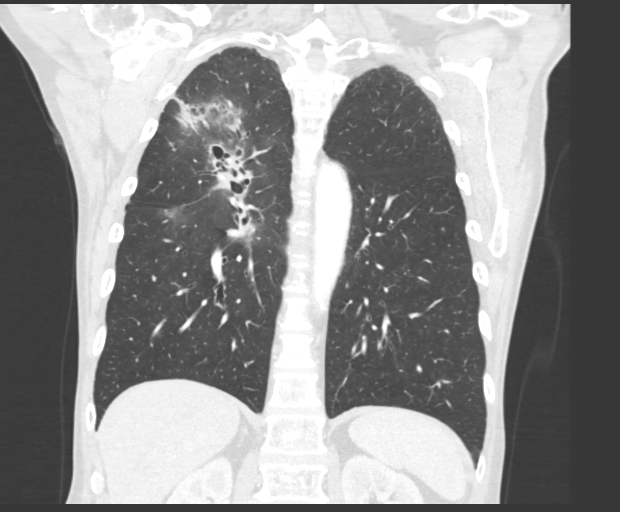

[15 of 36 positions shown; findings below may reference images not displayed]

FINDINGS: Cardiovascular: Normal heart size. No significant pericardial
effusion/thickening. Left anterior descending coronary
atherosclerosis. Atherosclerotic nonaneurysmal thoracic aorta.
Normal caliber pulmonary arteries. No central pulmonary emboli.

Mediastinum/Nodes: No discrete thyroid nodules. Unremarkable
esophagus. No pathologically enlarged axillary, mediastinal or hilar
lymph nodes.

Lungs/Pleura: No pneumothorax. No pleural effusion. Mild
centrilobular and paraseptal emphysema with diffuse bronchial wall
thickening. Stable calcified basilar right lower lobe granuloma.
Posterior right upper lobe 5.0 x 3.4 cm lung mass crossing the major
fissure with new internal cavitary change (series 5/image 63),
decreased from 6.1 x 4.7 cm on [DATE] chest CT using similar
measurement technique. New patchy sharply marginated ground-glass
opacity and consolidation surrounding the treated posterior right
upper lobe pulmonary nodule, compatible with early postradiation
change. Two tiny pulmonary nodules in the posterior lower lobes
bilaterally, largest 0.3 cm on the right (series 5/image 141), both
stable. No new significant pulmonary nodules.

Upper abdomen: No acute abnormality.

Musculoskeletal: No aggressive appearing focal osseous lesions. Mild
thoracic spondylosis.
IMPRESSION: 1. Positive response to therapy. Posterior right upper lobe 5.0 x
3.4 cm lung mass crossing the major fissure, decreased in size since
[DATE] chest CT, with new internal cavitary change and
surrounding early postradiation changes.
2. No pathologically enlarged thoracic lymph nodes. No new sites of
metastatic disease in the chest.
3. Aortic Atherosclerosis ([FA]-[FA]) and Emphysema ([FA]-[FA]).

## 2020-02-05 MED ORDER — SODIUM CHLORIDE (PF) 0.9 % IJ SOLN
INTRAMUSCULAR | Status: AC
  Filled 2020-02-05: qty 50

## 2020-02-05 MED ORDER — IOHEXOL 300 MG/ML  SOLN
75.0000 mL | Freq: Once | INTRAMUSCULAR | Status: AC | PRN
  Administered 2020-02-05: 75 mL via INTRAVENOUS

## 2020-02-07 ENCOUNTER — Inpatient Hospital Stay: Payer: Medicare Other | Attending: Physician Assistant | Admitting: Internal Medicine

## 2020-02-07 ENCOUNTER — Encounter: Payer: Self-pay | Admitting: Internal Medicine

## 2020-02-07 ENCOUNTER — Other Ambulatory Visit: Payer: Self-pay

## 2020-02-07 VITALS — BP 118/61 | HR 58 | Temp 97.5°F | Resp 18 | Ht 73.0 in | Wt 157.4 lb

## 2020-02-07 DIAGNOSIS — Z5111 Encounter for antineoplastic chemotherapy: Secondary | ICD-10-CM | POA: Insufficient documentation

## 2020-02-07 DIAGNOSIS — C3491 Malignant neoplasm of unspecified part of right bronchus or lung: Secondary | ICD-10-CM

## 2020-02-07 DIAGNOSIS — Z5112 Encounter for antineoplastic immunotherapy: Secondary | ICD-10-CM | POA: Diagnosis not present

## 2020-02-07 DIAGNOSIS — I10 Essential (primary) hypertension: Secondary | ICD-10-CM | POA: Diagnosis not present

## 2020-02-07 DIAGNOSIS — C3411 Malignant neoplasm of upper lobe, right bronchus or lung: Secondary | ICD-10-CM | POA: Diagnosis not present

## 2020-02-07 DIAGNOSIS — Z7189 Other specified counseling: Secondary | ICD-10-CM

## 2020-02-07 DIAGNOSIS — Z79899 Other long term (current) drug therapy: Secondary | ICD-10-CM | POA: Diagnosis not present

## 2020-02-07 NOTE — Progress Notes (Signed)
ON PATHWAY REGIMEN - Non-Small Cell Lung  No Change  Continue With Treatment as Ordered.  Original Decision Date/Time: 11/13/2019 17:07     Administer weekly:     Paclitaxel      Carboplatin   **Always confirm dose/schedule in your pharmacy ordering system**  Patient Characteristics: Preoperative or Nonsurgical Candidate (Clinical Staging), Stage III - Nonsurgical Candidate (Nonsquamous and Squamous), PS = 0, 1 Therapeutic Status: Preoperative or Nonsurgical Candidate (Clinical Staging) AJCC T Category: cT3 AJCC N Category: cN2 AJCC M Category: cM0 AJCC 8 Stage Grouping: IIIB ECOG Performance Status: 1 Intent of Therapy: Curative Intent, Discussed with Patient

## 2020-02-07 NOTE — Progress Notes (Signed)
DISCONTINUE ON PATHWAY REGIMEN - Non-Small Cell Lung     Administer weekly:     Paclitaxel      Carboplatin   **Always confirm dose/schedule in your pharmacy ordering system**  REASON: Continuation Of Treatment PRIOR TREATMENT: [id number]: Carboplatin AUC=2 + Paclitaxel 45 mg/m2 Weekly During Radiation TREATMENT RESPONSE: Partial Response (PR)  START ON PATHWAY REGIMEN - Non-Small Cell Lung     A cycle is every 28 days:     Durvalumab   **Always confirm dose/schedule in your pharmacy ordering system**  Patient Characteristics: Preoperative or Nonsurgical Candidate (Clinical Staging), Stage III - Nonsurgical Candidate (Nonsquamous and Squamous), PS = 0, 1 Therapeutic Status: Preoperative or Nonsurgical Candidate (Clinical Staging) AJCC T Category: cT3 AJCC N Category: cN2 AJCC M Category: cM0 AJCC 8 Stage Grouping: IIIB ECOG Performance Status: 1 Intent of Therapy: Curative Intent, Discussed with Patient

## 2020-02-07 NOTE — Progress Notes (Signed)
[facility] [phone]   [phone]  OFFICE PROGRESS NOTE  Lujean Amel, MD [address] [place] [address]  DIAGNOSIS: stage IIIA/B (T3, N1/N2, M0)non-small cell lung cancer, adenocarcinoma. He presented with a right upper lobe lung mass with direct extension across the major fissure into the superior segment of the right lower lobe and ipsilateral hilar lymphadenopathy. There is questionable subcarinal lymphadenopathy versus esophageal hypermetabolism this was evaluated by gastroenterology with upper endoscopy on 01/30/2020 and there was no mass lesion or malignancy in that area. The patient was diagnosed in May 2021.  Biomarker Findings Microsatellite status - MS-Stable Tumor Mutational Burden - 9 Muts/Mb Genomic Findings For a complete list of the genes assayed, please refer to the Appendix. KRAS G12C CDKN2A/B p16INK4a S12* NKX2-1 amplification - equivocal? PMS2 R3Q TP53 P66fs*38 7 Disease relevant genes with no reportable alterations: ALK, BRAF, EGFR, ERBB2, MET, RET, ROS1  PDL1: 70%  PRIOR THERAPY: Weekly concurrent chemoradiation with carboplatin for an AUC of 2 and paclitaxel 45 mg/m. First dose expected on 11/26/2019. Status post 7 cycles.   Last dose was giving 01/07/2020 with partial response.   CURRENT THERAPY:  Consolidation treatment with immunotherapy with Imfinzi 1500 mg IV every 4 weeks.  First dose 02/19/2020.  INTERVAL HISTORY: Christopher Harding 69 y.o. male returns to the clinic today for follow-up visit accompanied by his wife.  The patient is feeling fine today with no concerning complaints except for mild fatigue.  His swallowing has improved.  He also gained few pounds since the last visit.  He denied having any current chest pain, shortness of breath, cough or hemoptysis.  He denied having any fever or chills.  He has no nausea, vomiting, diarrhea or constipation.  He denied having any  headache or visual changes.  The patient denied having any weight loss or night sweats.  He tolerated the previous course of concurrent chemoradiation fairly well.  He had repeat CT scan of the chest performed recently and he is here for evaluation and discussion of his scan results.   MEDICAL HISTORY: Past Medical History:  Diagnosis Date  . A-fib [facility])    s/p DCCV 12/07/18  . Cancer ([place])   . Depression    situational  . Dysrhythmia   . Hypertension     ALLERGIES:  has No Known Allergies.  MEDICATIONS:  Current Outpatient Medications  Medication Sig Dispense Refill  . amlodipine-benazepril (LOTREL) 2.5-10 MG capsule Take 1 capsule by mouth daily. 90 capsule 3  . HYDROcodone-acetaminophen (HYCET) 7.5-325 mg/15 ml solution Take 10 mLs by mouth 4 (four) times daily as needed for moderate pain. 120 mL 0  . ibuprofen (ADVIL) 200 MG tablet Take 400 mg by mouth every 8 (eight) hours as needed (for pain).     Marland Kitchen lidocaine (XYLOCAINE) 2 % solution Use as directed 15 mLs in the mouth or throat every 3 (three) hours as needed for mouth pain. 200 mL 0  . metoprolol tartrate (LOPRESSOR) 50 MG tablet Take 50 mg by mouth 2 (two) times daily with a meal.    . mucosal barrier oral (GELCLAIR) GEL Mix 1 packet in about 8 oz of water and drink TID 21 packet 1  . Omeprazole (PRILOSEC PO) Take 20 mg by mouth.     . prochlorperazine (COMPAZINE) 10 MG tablet Take 1 tablet (10 mg total) by mouth every 6 (six) hours as needed. (Patient not taking: Reported on 11/16/2019) 30 tablet 2  .  sucralfate (CARAFATE) 1 g tablet Crush and mix in 1 oz water and drink 5 min TID before meals and at HS for radiation induced esophagitis 120 tablet 2  . XARELTO 20 MG TABS tablet Take 20 mg by mouth daily in the afternoon.      Current Facility-Administered Medications  Medication Dose Route Frequency Provider Last Rate Last Admin  . 0.9 %  sodium chloride infusion  500 mL Intravenous Continuous [place], MD      . 0.9  %  sodium chloride infusion  500 mL Intravenous Once [place], MD        SURGICAL HISTORY:  Past Surgical History:  Procedure Laterality Date  . CARDIOVERSION N/A 12/07/2018   Procedure: CARDIOVERSION;  Surgeon: Josue Hector, MD;  Location: [facility] ENDOSCOPY;  Service: Cardiovascular;  Laterality: N/A;  . FINE NEEDLE ASPIRATION  10/30/2019   Procedure: FINE NEEDLE ASPIRATION (FNA) LINEAR;  Surgeon: Candee Furbish, MD;  Location: [facility] ENDOSCOPY;  Service: Pulmonary;;  . INGUINAL HERNIA REPAIR Right 01/25/2019   Procedure: RIGHT INGUINAL HERNIA REPAIR WITH MESH;  Surgeon: Coralie Keens, MD;  Location: [facility];  Service: General;  Laterality: Right;  TAP BLOCK  . INSERTION OF MESH Right 01/25/2019   Procedure: Insertion Of Mesh;  Surgeon: Coralie Keens, MD;  Location: [facility];  Service: General;  Laterality: Right;  Marland Kitchen VIDEO BRONCHOSCOPY WITH ENDOBRONCHIAL ULTRASOUND N/A 10/30/2019   Procedure: VIDEO BRONCHOSCOPY WITH ENDOBRONCHIAL ULTRASOUND;  Surgeon: Candee Furbish, MD;  Location: [facility] ENDOSCOPY;  Service: Pulmonary;  Laterality: N/A;    REVIEW OF SYSTEMS:  Constitutional: positive for fatigue Eyes: negative Ears, nose, mouth, throat, and face: negative Respiratory: negative Cardiovascular: negative Gastrointestinal: negative Genitourinary:negative Integument/breast: negative Hematologic/lymphatic: negative Musculoskeletal:negative Neurological: negative Behavioral/Psych: negative Endocrine: negative Allergic/Immunologic: negative   PHYSICAL EXAMINATION: General appearance: alert, cooperative, fatigued and no distress Head: Normocephalic, without obvious abnormality, atraumatic Neck: no adenopathy, no JVD, supple, symmetrical, trachea midline and thyroid not enlarged, symmetric, no tenderness/mass/nodules Lymph nodes: Cervical, supraclavicular, and axillary nodes normal. Resp: clear to auscultation bilaterally Back: symmetric, no curvature. ROM normal. No CVA  tenderness. Cardio: regular rate and rhythm, S1, S2 normal, no murmur, click, rub or gallop GI: soft, non-tender; bowel sounds normal; no masses,  no organomegaly Extremities: extremities normal, atraumatic, no cyanosis or edema Neurologic: Alert and oriented X 3, normal strength and tone. Normal symmetric reflexes. Normal coordination and gait  ECOG PERFORMANCE STATUS: 1 - Symptomatic but completely ambulatory  Blood pressure 118/61, pulse (!) 58, temperature (!) 97.5 F (36.4 C), temperature source Tympanic, resp. rate 18, height _0  (1.854 m), weight 157 lb 6.4 oz (71.4 kg), SpO2 100 %.  LABORATORY DATA: Lab Results  Component Value Date   WBC 4.5 02/05/2020   HGB 9.7 (L) 02/05/2020   HCT 27.8 (L) 02/05/2020   MCV 101.1 (H) 02/05/2020   PLT 223 02/05/2020      Chemistry      Component Value Date/Time   NA 130 (L) 02/05/2020 1139   NA 129 (L) 11/24/2018 1520   K 4.6 02/05/2020 1139   CL 100 02/05/2020 1139   CO2 23 02/05/2020 1139   BUN 8 02/05/2020 1139   BUN 8 11/24/2018 1520   CREATININE 0.74 02/05/2020 1139      Component Value Date/Time   CALCIUM 9.5 02/05/2020 1139   ALKPHOS 80 02/05/2020 1139   AST 17 02/05/2020 1139   ALT <6 02/05/2020 1139   BILITOT 0.5 02/05/2020 1139  RADIOGRAPHIC STUDIES: CT Chest W Contrast  Result Date: 02/05/2020 CLINICAL DATA:  Non-small cell right lung cancer completed concurrent chemoradiation therapy 01/11/2020. Restaging. EXAM: CT CHEST WITH CONTRAST TECHNIQUE: Multidetector CT imaging of the chest was performed during intravenous contrast administration. CONTRAST:  36mL OMNIPAQUE IOHEXOL 300 MG/ML  SOLN COMPARISON:  11/12/2019 PET-CT.  10/01/2019 chest CT. FINDINGS: Cardiovascular: Normal heart size. No significant pericardial effusion/thickening. Left anterior descending coronary atherosclerosis. Atherosclerotic nonaneurysmal thoracic aorta. Normal caliber pulmonary arteries. No central pulmonary emboli. Mediastinum/Nodes:  No discrete thyroid nodules. Unremarkable esophagus. No pathologically enlarged axillary, mediastinal or hilar lymph nodes. Lungs/Pleura: No pneumothorax. No pleural effusion. Mild centrilobular and paraseptal emphysema with diffuse bronchial wall thickening. Stable calcified basilar right lower lobe granuloma. Posterior right upper lobe 5.0 x 3.4 cm lung mass crossing the major fissure with new internal cavitary change (series 5/image 63), decreased from 6.1 x 4.7 cm on 10/01/2019 chest CT using similar measurement technique. New patchy sharply marginated ground-glass opacity and consolidation surrounding the treated posterior right upper lobe pulmonary nodule, compatible with early postradiation change. Two tiny pulmonary nodules in the posterior lower lobes bilaterally, largest 0.3 cm on the right (series 5/image 141), both stable. No new significant pulmonary nodules. Upper abdomen: No acute abnormality. Musculoskeletal: No aggressive appearing focal osseous lesions. Mild thoracic spondylosis. IMPRESSION: 1. Positive response to therapy. Posterior right upper lobe 5.0 x 3.4 cm lung mass crossing the major fissure, decreased in size since 10/01/2019 chest CT, with new internal cavitary change and surrounding early postradiation changes. 2. No pathologically enlarged thoracic lymph nodes. No new sites of metastatic disease in the chest. 3. Aortic Atherosclerosis (ICD10-I70.0) and Emphysema (ICD10-J43.9). Electronically Signed   By: Ilona Sorrel M.D.   On: 02/05/2020 16:17    ASSESSMENT AND PLAN: This is a very pleasant 69 years old white male with a stage IIIA/B (T3, N1/N2, M0) non-small cell lung cancer, adenocarcinoma presented with right upper lobe lung mass with direct extension across the major fissure into the superior segment of the right lower lobe and ipsilateral hilar lymphadenopathy as well as questionable subcarinal lymphadenopathy. The patient completed a course of concurrent chemoradiation with  weekly carboplatin and paclitaxel status post 7 cycles.  He tolerated this treatment well with no concerning adverse effect except for mild sore throat and mouth. He is recovering well from the previous course of concurrent chemoradiation. The patient had repeat CT scan of the chest performed recently.  I personally and independently reviewed the scans and discussed the results with the patient and his wife and showed them the images.  The patient has partial response to this treatment with improvement and the right upper lobe lung mass as well as the mediastinal lymphadenopathy. The patient was evaluated by gastroenterology and the distal esophageal mass was not appreciated on the upper endoscopy and no malignancy in that area.  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 I have lengthy discussion with the patient today about his current condition and treatment options.  I gave him the option of continuous observation and monitoring versus proceeding with consolidation immunotherapy with Imfinzi 1500 mg IV every 4 weeks for a total of 1 year if he has no evidence for disease progression or unacceptable toxicity. I discussed with the patient the adverse effect of the immunotherapy including but not limited to immunotherapy mediated pneumonitis, skin rash, diarrhea, inflammation of the liver, kidney, thyroid or other endocrine dysfunction. The patient would like to proceed with the consolidation treatment as planned.  Is expected to  start the first dose of this treatment on 02/19/2020. He will come back for follow-up visit in 5 weeks for evaluation with the start of cycle #2. The patient was advised to call immediately if he has any concerning symptoms in the interval.  The patient voices understanding of current disease status and treatment options and is in agreement with the current care plan.  All questions were answered. The patient knows to call the clinic with any problems, questions or  concerns. We can certainly see the patient much sooner if necessary.  Disclaimer: This note was dictated with voice recognition software. Similar sounding words can inadvertently be transcribed and may not be corrected upon review.

## 2020-02-13 DIAGNOSIS — C3491 Malignant neoplasm of unspecified part of right bronchus or lung: Secondary | ICD-10-CM | POA: Diagnosis not present

## 2020-02-13 DIAGNOSIS — I482 Chronic atrial fibrillation, unspecified: Secondary | ICD-10-CM | POA: Diagnosis not present

## 2020-02-13 DIAGNOSIS — I10 Essential (primary) hypertension: Secondary | ICD-10-CM | POA: Diagnosis not present

## 2020-02-14 ENCOUNTER — Telehealth: Payer: Self-pay | Admitting: *Deleted

## 2020-02-14 NOTE — Telephone Encounter (Signed)
Ms. Shaneyfelt called with questions regarding Mr. Binstock's treatment medication he will be getting.  I called and spoke to her.  She was thankful for the call. She also wanted to make sure this was covered with insurance. I will reach out to [place] with confirmation.  I told her I would call her if not approved.  She verbalized understanding.

## 2020-02-16 NOTE — Progress Notes (Signed)
.   Pharmacist Chemotherapy Monitoring - Initial Assessment    Anticipated start date: 02/19/20   Regimen:  . Are orders appropriate based on the patient's diagnosis, regimen, and cycle? Yes . Does the plan date match the patient's scheduled date? Yes . Is the sequencing of drugs appropriate? Yes . Are the premedications appropriate for the patient's regimen? Yes . Prior Authorization for treatment is: Approved o If applicable, is the correct biosimilar selected based on the patient's insurance? not applicable  Organ Function and Labs: Marland Kitchen Are dose adjustments needed based on the patient's renal function, hepatic function, or hematologic function? No . Are appropriate labs ordered prior to the start of patient's treatment? Yes . Other organ system assessment, if indicated: N/A . The following baseline labs, if indicated, have been ordered: durvalumab: baseline TSH +/- T4  Dose Assessment: . Are the drug doses appropriate? Yes . Are the following correct: o Drug concentrations Yes o IV fluid compatible with drug Yes o Administration routes Yes o Timing of therapy Yes . If applicable, does the patient have documented access for treatment and/or plans for port-a-cath placement? yes . If applicable, have lifetime cumulative doses been properly documented and assessed? not applicable Lifetime Dose Tracking  . Carboplatin: 1,400 mg = 0.01 % of the maximum lifetime dose of 999,999,999 mg  o   Toxicity Monitoring/Prevention: . The patient has the following take home antiemetics prescribed: N/A . The patient has the following take home medications prescribed: N/A . Medication allergies and previous infusion related reactions, if applicable, have been reviewed and addressed. Yes . The patient's current medication list has been assessed for drug-drug interactions with their chemotherapy regimen. no significant drug-drug interactions were identified on review.  Order Review: . Are the  treatment plan orders signed? Yes . Is the patient scheduled to see a provider prior to their treatment? No  I verify that I have reviewed each item in the above checklist and answered each question accordingly.  Christopher Harding 02/16/2020 10:50 AM

## 2020-02-19 ENCOUNTER — Other Ambulatory Visit: Payer: Self-pay

## 2020-02-19 ENCOUNTER — Inpatient Hospital Stay: Payer: Medicare Other

## 2020-02-19 VITALS — BP 139/88 | HR 55 | Temp 98.0°F | Resp 16

## 2020-02-19 DIAGNOSIS — C3411 Malignant neoplasm of upper lobe, right bronchus or lung: Secondary | ICD-10-CM | POA: Diagnosis not present

## 2020-02-19 DIAGNOSIS — C3491 Malignant neoplasm of unspecified part of right bronchus or lung: Secondary | ICD-10-CM

## 2020-02-19 DIAGNOSIS — Z5111 Encounter for antineoplastic chemotherapy: Secondary | ICD-10-CM | POA: Diagnosis not present

## 2020-02-19 DIAGNOSIS — Z79899 Other long term (current) drug therapy: Secondary | ICD-10-CM | POA: Diagnosis not present

## 2020-02-19 LAB — CBC WITH DIFFERENTIAL (CANCER CENTER ONLY)
Abs Immature Granulocytes: 0.03 10*3/uL (ref 0.00–0.07)
Basophils Absolute: 0 10*3/uL (ref 0.0–0.1)
Basophils Relative: 1 %
Eosinophils Absolute: 0.1 10*3/uL (ref 0.0–0.5)
Eosinophils Relative: 1 %
HCT: 30.3 % — ABNORMAL LOW (ref 39.0–52.0)
Hemoglobin: 10.7 g/dL — ABNORMAL LOW (ref 13.0–17.0)
Immature Granulocytes: 1 %
Lymphocytes Relative: 9 %
Lymphs Abs: 0.5 10*3/uL — ABNORMAL LOW (ref 0.7–4.0)
MCH: 35.2 pg — ABNORMAL HIGH (ref 26.0–34.0)
MCHC: 35.3 g/dL (ref 30.0–36.0)
MCV: 99.7 fL (ref 80.0–100.0)
Monocytes Absolute: 0.7 10*3/uL (ref 0.1–1.0)
Monocytes Relative: 12 %
Neutro Abs: 4.2 10*3/uL (ref 1.7–7.7)
Neutrophils Relative %: 76 %
Platelet Count: 195 10*3/uL (ref 150–400)
RBC: 3.04 MIL/uL — ABNORMAL LOW (ref 4.22–5.81)
RDW: 15.6 % — ABNORMAL HIGH (ref 11.5–15.5)
WBC Count: 5.5 10*3/uL (ref 4.0–10.5)
nRBC: 0 % (ref 0.0–0.2)

## 2020-02-19 LAB — CMP (CANCER CENTER ONLY)
ALT: <6 U/L (ref 0–44)
AST: 16 U/L (ref 15–41)
Albumin: 3.1 g/dL — ABNORMAL LOW (ref 3.5–5.0)
Alkaline Phosphatase: 56 U/L (ref 38–126)
Anion gap: 6 (ref 5–15)
BUN: 7 mg/dL — ABNORMAL LOW (ref 8–23)
CO2: 21 mmol/L — ABNORMAL LOW (ref 22–32)
Calcium: 8.8 mg/dL — ABNORMAL LOW (ref 8.9–10.3)
Chloride: 101 mmol/L (ref 98–111)
Creatinine: 0.71 mg/dL (ref 0.61–1.24)
GFR, Est AFR Am: >60 mL/min (ref >60)
GFR, Estimated: >60 mL/min (ref >60)
Glucose, Bld: 86 mg/dL (ref 70–99)
Potassium: 4.6 mmol/L (ref 3.5–5.1)
Sodium: 128 mmol/L — ABNORMAL LOW (ref 135–145)
Total Bilirubin: 0.5 mg/dL (ref 0.3–1.2)
Total Protein: 6.8 g/dL (ref 6.5–8.1)

## 2020-02-19 LAB — TSH: TSH: 0.772 u[IU]/mL (ref 0.320–4.118)

## 2020-02-19 MED ORDER — SODIUM CHLORIDE 0.9 % IV SOLN
1500.0000 mg | Freq: Once | INTRAVENOUS | Status: AC
  Administered 2020-02-19: 1500 mg via INTRAVENOUS
  Filled 2020-02-19: qty 30

## 2020-02-19 MED ORDER — SODIUM CHLORIDE 0.9 % IV SOLN
Freq: Once | INTRAVENOUS | Status: AC
  Filled 2020-02-19: qty 250

## 2020-02-19 NOTE — Patient Instructions (Signed)
[facility] Discharge Instructions for Patients Receiving Chemotherapy  Today you received the following chemotherapy agents: durvalumab (Imfinzi).  To help prevent nausea and vomiting after your treatment, we encourage you to take your nausea medication as directed.   If you develop nausea and vomiting that is not controlled by your nausea medication, call the clinic.   BELOW ARE SYMPTOMS THAT SHOULD BE REPORTED IMMEDIATELY:  *FEVER GREATER THAN 100.5 F  *CHILLS WITH OR WITHOUT FEVER  NAUSEA AND VOMITING THAT IS NOT CONTROLLED WITH YOUR NAUSEA MEDICATION  *UNUSUAL SHORTNESS OF BREATH  *UNUSUAL BRUISING OR BLEEDING  TENDERNESS IN MOUTH AND THROAT WITH OR WITHOUT PRESENCE OF ULCERS  *URINARY PROBLEMS  *BOWEL PROBLEMS  UNUSUAL RASH Items with * indicate a potential emergency and should be followed up as soon as possible.  Feel free to call the clinic should you have any questions or concerns. The clinic phone number is [phone].  Please show the [facility] at check-in to the Emergency Department and triage nurse.  Durvalumab injection What is this medicine? DURVALUMAB (dur VAL ue mab) is a monoclonal antibody. It is used to treat urothelial cancer and lung cancer. This medicine may be used for other purposes; ask your health care provider or pharmacist if you have questions. COMMON BRAND NAME(S): IMFINZI What should I tell my health care provider before I take this medicine? They need to know if you have any of these conditions:  diabetes  immune system problems  infection  inflammatory bowel disease  kidney disease  liver disease  lung or breathing disease  lupus  organ transplant  stomach or intestine problems  thyroid disease  an unusual or allergic reaction to durvalumab, other medicines, foods, dyes, or preservatives  pregnant or trying to get pregnant  breast-feeding How should I use this medicine? This medicine is  for infusion into a vein. It is given by a health care professional in a hospital or clinic setting. A special MedGuide will be given to you before each treatment. Be sure to read this information carefully each time. Talk to your pediatrician regarding the use of this medicine in children. Special care may be needed. Overdosage: If you think you have taken too much of this medicine contact a poison control center or emergency room at once. NOTE: This medicine is only for you. Do not share this medicine with others. What if I miss a dose? It is important not to miss your dose. Call your doctor or health care professional if you are unable to keep an appointment. What may interact with this medicine? Interactions have not been studied. This list may not describe all possible interactions. Give your health care provider a list of all the medicines, herbs, non-prescription drugs, or dietary supplements you use. Also tell them if you smoke, drink alcohol, or use illegal drugs. Some items may interact with your medicine. What should I watch for while using this medicine? This drug may make you feel generally unwell. Continue your course of treatment even though you feel ill unless your doctor tells you to stop. You may need blood work done while you are taking this medicine. Do not become pregnant while taking this medicine or for 3 months after stopping it. Women should inform their doctor if they wish to become pregnant or think they might be pregnant. There is a potential for serious side effects to an unborn child. Talk to your health care professional or pharmacist for more information. Do not  breast-feed an infant while taking this medicine or for 3 months after stopping it. What side effects may I notice from receiving this medicine? Side effects that you should report to your doctor or health care professional as soon as possible:  allergic reactions like skin rash, itching or hives, swelling of  the face, lips, or tongue  black, tarry stools  bloody or watery diarrhea  breathing problems  change in emotions or moods  change in sex drive  changes in vision  chest pain or chest tightness  chills  confusion  cough  facial flushing  fever  headache  signs and symptoms of high blood sugar such as dizziness; dry mouth; dry skin; fruity breath; nausea; stomach pain; increased hunger or thirst; increased urination  signs and symptoms of liver injury like dark yellow or brown urine; general ill feeling or flu-like symptoms; light-colored stools; loss of appetite; nausea; right upper belly pain; unusually weak or tired; yellowing of the eyes or skin  stomach pain  trouble passing urine or change in the amount of urine  weight gain or weight loss Side effects that usually do not require medical attention (report these to your doctor or health care professional if they continue or are bothersome):  bone pain  constipation  loss of appetite  muscle pain  nausea  swelling of the ankles, feet, hands  tiredness This list may not describe all possible side effects. Call your doctor for medical advice about side effects. You may report side effects to FDA at 1-800-FDA-1088. Where should I keep my medicine? This drug is given in a hospital or clinic and will not be stored at home. NOTE: This sheet is a summary. It may not cover all possible information. If you have questions about this medicine, talk to your doctor, pharmacist, or health care provider.  2020 Elsevier/Gold Standard (2016-08-03 19:25:04)

## 2020-02-20 ENCOUNTER — Telehealth: Payer: Self-pay | Admitting: *Deleted

## 2020-03-14 DIAGNOSIS — Z23 Encounter for immunization: Secondary | ICD-10-CM | POA: Diagnosis not present

## 2020-03-15 NOTE — Progress Notes (Deleted)
[place] OFFICE PROGRESS NOTE  Koirala, Dibas, MD [place] [address] [place] [address]  DIAGNOSIS: Stage III (T3, N1/N2, M0)non-small cell lung cancer, adenocarcinoma. He presented with a right upper lobe lung mass with direct extension across the major fissure into the superior segment of the right lower lobe and ipsilateral hilar lymphadenopathy. There is questionable subcarinal lymphadenopathy versus esophageal hypermetabolism. The patient was diagnosed in May 2021.  Biomarker Findings Microsatellite status - MS-Stable Tumor Mutational Burden - 9 Muts/Mb Genomic Findings For a complete list of the genes assayed, please refer to the Appendix. KRAS G12C CDKN2A/B p16INK4a S12* NKX2-1 amplification - equivocal? PMS2 R3Q TP53 P48fs*38 7 Disease relevant genes with no reportable alterations: ALK, BRAF, EGFR, ERBB2, MET, RET, ROS1  PDL1: 70%  PRIOR THERAPY: Weekly concurrent chemoradiation with carboplatin for an AUC of 2 and paclitaxel 45 mg/m. First dose expected on 11/26/2019.Status post 7 cycles.  Last dose was giving 01/07/2020 with partial response.  CURRENT THERAPY: Consolidation treatment with immunotherapy with Imfinzi 1500 mg IV every 4 weeks.  First dose 02/19/2020. Status post 1 cycle.   INTERVAL HISTORY: Christopher Harding 69 y.o. male returns to the clinic for a follow up visit. The patient is feeling well today without any concerning complaints. The patient is status post his first cycle of immunotherapy with Imfinzi and tolerated it well without any adverse side effects. His esophagitis is _improving. Denies any fever, chills, night sweats, or weight loss. Denies any chest pain, cough, or hemoptysis. He reports his baseline mild shortness of breath with exertion. Denies any nausea, vomiting, diarrhea, or constipation. Denies any headache or visual changes. Denies any rashes or skin changes. The patient is here today for evaluation prior  to starting cycle # 2   MEDICAL HISTORY: Past Medical History:  Diagnosis Date  . A-fib [facility])    s/p DCCV 12/07/18  . Cancer ([place])   . Depression    situational  . Dysrhythmia   . Hypertension     ALLERGIES:  has No Known Allergies.  MEDICATIONS:  Current Outpatient Medications  Medication Sig Dispense Refill  . HYDROcodone-acetaminophen (HYCET) 7.5-325 mg/15 ml solution Take 10 mLs by mouth 4 (four) times daily as needed for moderate pain. 120 mL 0  . ibuprofen (ADVIL) 200 MG tablet Take 400 mg by mouth every 8 (eight) hours as needed (for pain).     Marland Kitchen lidocaine (XYLOCAINE) 2 % solution Use as directed 15 mLs in the mouth or throat every 3 (three) hours as needed for mouth pain. 200 mL 0  . metoprolol tartrate (LOPRESSOR) 50 MG tablet Take 50 mg by mouth 2 (two) times daily with a meal.    . mucosal barrier oral (GELCLAIR) GEL Mix 1 packet in about 8 oz of water and drink TID 21 packet 1  . Omeprazole (PRILOSEC PO) Take 20 mg by mouth.     . prochlorperazine (COMPAZINE) 10 MG tablet Take 1 tablet (10 mg total) by mouth every 6 (six) hours as needed. (Patient not taking: Reported on 11/16/2019) 30 tablet 2  . sucralfate (CARAFATE) 1 g tablet Crush and mix in 1 oz water and drink 5 min TID before meals and at HS for radiation induced esophagitis 120 tablet 2  . XARELTO 20 MG TABS tablet Take 20 mg by mouth daily in the afternoon.      Current Facility-Administered Medications  Medication Dose Route Frequency Provider Last Rate Last Admin  . 0.9 %  sodium chloride  infusion  500 mL Intravenous Continuous Thornton Park, MD      . 0.9 %  sodium chloride infusion  500 mL Intravenous Once Thornton Park, MD        SURGICAL HISTORY:  Past Surgical History:  Procedure Laterality Date  . CARDIOVERSION N/A 12/07/2018   Procedure: CARDIOVERSION;  Surgeon: Josue Hector, MD;  Location: [facility] ENDOSCOPY;  Service: Cardiovascular;  Laterality: N/A;  . FINE NEEDLE ASPIRATION  10/30/2019    Procedure: FINE NEEDLE ASPIRATION (FNA) LINEAR;  Surgeon: Candee Furbish, MD;  Location: [facility] ENDOSCOPY;  Service: Pulmonary;;  . INGUINAL HERNIA REPAIR Right 01/25/2019   Procedure: RIGHT INGUINAL HERNIA REPAIR WITH MESH;  Surgeon: Coralie Keens, MD;  Location: [place];  Service: General;  Laterality: Right;  TAP BLOCK  . INSERTION OF MESH Right 01/25/2019   Procedure: Insertion Of Mesh;  Surgeon: Coralie Keens, MD;  Location: [place];  Service: General;  Laterality: Right;  Marland Kitchen VIDEO BRONCHOSCOPY WITH ENDOBRONCHIAL ULTRASOUND N/A 10/30/2019   Procedure: VIDEO BRONCHOSCOPY WITH ENDOBRONCHIAL ULTRASOUND;  Surgeon: Candee Furbish, MD;  Location[facility]COPY;  Service: Pulmonary;  Laterality: N/A;    REVIEW OF SYSTEMS:   Review of Systems  Constitutional: Negative for appetite change, chills, fatigue, fever and unexpected weight change.  HENT:   Negative for mouth sores, nosebleeds, sore throat and trouble swallowing.   Eyes: Negative for eye problems and icterus.  Respiratory: Negative for cough, hemoptysis, shortness of breath and wheezing.   Cardiovascular: Negative for chest pain and leg swelling.  Gastrointestinal: Negative for abdominal pain, constipation, diarrhea, nausea and vomiting.  Genitourinary: Negative for bladder incontinence, difficulty urinating, dysuria, frequency and hematuria.   Musculoskeletal: Negative for back pain, gait problem, neck pain and neck stiffness.  Skin: Negative for itching and rash.  Neurological: Negative for dizziness, extremity weakness, gait problem, headaches, light-headedness and seizures.  Hematological: Negative for adenopathy. Does not bruise/bleed easily.  Psychiatric/Behavioral: Negative for confusion, depression and sleep disturbance. The patient is not nervous/anxious.     PHYSICAL EXAMINATION:  There were no vitals taken for this visit.  ECOG PERFORMANCE STATUS: {CHL ONC ECOG [id number]  Physical Exam  Constitutional: Oriented to  person, place, and time and well-developed, well-nourished, and in no distress. No distress.  HENT:  Head: Normocephalic and atraumatic.  Mouth/Throat: Oropharynx is clear and moist. No oropharyngeal exudate.  Eyes: Conjunctivae are normal. Right eye exhibits no discharge. Left eye exhibits no discharge. No scleral icterus.  Neck: Normal range of motion. Neck supple.  Cardiovascular: Normal rate, regular rhythm, normal heart sounds and intact distal pulses.   Pulmonary/Chest: Effort normal and breath sounds normal. No respiratory distress. No wheezes. No rales.  Abdominal: Soft. Bowel sounds are normal. Exhibits no distension and no mass. There is no tenderness.  Musculoskeletal: Normal range of motion. Exhibits no edema.  Lymphadenopathy:    No cervical adenopathy.  Neurological: Alert and oriented to person, place, and time. Exhibits normal muscle tone. Gait normal. Coordination normal.  Skin: Skin is warm and dry. No rash noted. Not diaphoretic. No erythema. No pallor.  Psychiatric: Mood, memory and judgment normal.  Vitals reviewed.  LABORATORY DATA: Lab Results  Component Value Date   WBC 5.5 02/19/2020   HGB 10.7 (L) 02/19/2020   HCT 30.3 (L) 02/19/2020   MCV 99.7 02/19/2020   PLT 195 02/19/2020      Chemistry      Component Value Date/Time   NA 128 (L) 02/19/2020 1403   NA 129 (L) 11/24/2018  1520   K 4.6 02/19/2020 1403   CL 101 02/19/2020 1403   CO2 21 (L) 02/19/2020 1403   BUN 7 (L) 02/19/2020 1403   BUN 8 11/24/2018 1520   CREATININE 0.71 02/19/2020 1403      Component Value Date/Time   CALCIUM 8.8 (L) 02/19/2020 1403   ALKPHOS 56 02/19/2020 1403   AST 16 02/19/2020 1403   ALT <6 02/19/2020 1403   BILITOT 0.5 02/19/2020 1403       RADIOGRAPHIC STUDIES:  No results found.   ASSESSMENT/PLAN:  This is a very pleasant 69 year old Caucasian male recently diagnosed with stage III (T3, N1/N2, M0)non-small cell lung cancer, adenocarcinoma. He presented with  a right upper lobe lung mass with direct extension across the major fissure into the superior segment of the right lower lobe and ipsilateral hilar lymphadenopathy.The patient was diagnosed in May 2021.Molecular studies show no actionable mutations. His PDL1 expression in 70%.  He completed 7 cycles of weekly concurrent chemoradiation with carboplatin and paclitaxel. He had a partial response to treatment.   He is currently undergoing consolidation immunotherapy with Imfinzi. He is status post 1 cycle and tolerated it well.   Labs were reviewed. Recommend that he proceed with cycle #2 today as scheduled.   We will see him back for a follow up visit in 4 weeks for evaluation before starting cycle #3.   The patient was advised to call immediately if he has any concerning symptoms in the interval. The patient voices understanding of current disease status and treatment options and is in agreement with the current care plan. All questions were answered. The patient knows to call the clinic with any problems, questions or concerns. We can certainly see the patient much sooner if necessary    No orders of the defined types were placed in this encounter.    Benjermin Korber L Briyonna Omara, PA-C 03/15/20

## 2020-03-17 ENCOUNTER — Telehealth: Payer: Self-pay

## 2020-03-17 NOTE — Telephone Encounter (Signed)
Patient's wife called to report patient has sharp pain on his right side. Per wife pain is dire 10/10 after patient consumes food and especially dairy products. Patient denies nausea, vomiting, or fever. Dr. Julien Nordmann made aware and instructed patient to see his GI doctor but if pain is truly 10/10 then Dr. Julien Nordmann stated patient needs to go the Emergency Room. Patient's wife verbalized understanding.

## 2020-03-18 ENCOUNTER — Other Ambulatory Visit: Payer: Medicare Other

## 2020-03-18 ENCOUNTER — Telehealth: Payer: Self-pay | Admitting: Internal Medicine

## 2020-03-18 ENCOUNTER — Ambulatory Visit: Payer: Medicare Other

## 2020-03-18 ENCOUNTER — Ambulatory Visit: Payer: Medicare Other | Admitting: Internal Medicine

## 2020-03-18 NOTE — Telephone Encounter (Signed)
Per sch msg, patient needed to r/s 10/13 appt. Called and spoke with patients wife. Confirmed new date of time of appt. Sent msg to MD about request to skip f/u for next appt. Will call patient back with a response

## 2020-03-19 ENCOUNTER — Inpatient Hospital Stay: Payer: Medicare Other

## 2020-03-19 ENCOUNTER — Inpatient Hospital Stay: Payer: Medicare Other | Admitting: Physician Assistant

## 2020-03-21 NOTE — Progress Notes (Signed)
[facility] OFFICE PROGRESS NOTE  Koirala, Dibas, MD [place] [address] [place] [address]  DIAGNOSIS: Stage IIIA/B (T3, N1/N2, M0)non-small cell lung cancer, adenocarcinoma. He presented with a right upper lobe lung mass with direct extension across the major fissure into the superior segment of the right lower lobe and ipsilateral hilar lymphadenopathy. There is questionable subcarinal lymphadenopathy versus esophageal hypermetabolism. This was evaluated by gastroenterology with upper endoscopy on 01/30/2020 and there was no mass lesion or malignancy in that area. The patient was diagnosed in May 2021.  Biomarker Findings Microsatellite status - MS-Stable Tumor Mutational Burden - 9 Muts/Mb Genomic Findings For a complete list of the genes assayed, please refer to the Appendix. KRAS G12C CDKN2A/B p16INK4a S12* NKX2-1 amplification - equivocal? PMS2 R3Q TP53 P45f*38 7 Disease relevant genes with no reportable alterations: ALK, BRAF, EGFR, ERBB2, MET, RET, ROS1  PDL1: 70%  PRIOR THERAPY: Weekly concurrent chemoradiation with carboplatin for an AUC of 2 and paclitaxel 45 mg/m. First dose expected on 11/26/2019.Status post 7 cycles.  Last dose was giving 01/07/2020 with partial response.  CURRENT THERAPY: Consolidation immunotherapy with Imfinzi 1500 mg IV every 4 weeks. First dose on 02/19/2020.  INTERVAL HISTORY: RDUTCH ING69y.o. male returns to the clinic today for a follow-up visit. The patient is feeling fine today without any concerning complaints. In the interval since his first treatment, the patient called on 03/17/2020 with a sharp right sided abdominal pain 10 out of 10 which occurs after eating or with consuming dairy products. The patient states that he took a nap and when he woke up, he no longer had any abdominal pain and the pain has not returned since.    Otherwise, the patient is status post his first cycle with consolidation  immunotherapy with Imfinzi and he tolerated it well without any adverse side effects. Today, he denies any fever, chills, night sweats, or weight loss. He denies any chest pain or hemoptysis but reports dyspnea on exertion which he believes is stable to slightly worse. He denies significant cough. The patient denies any nausea, vomiting, diarrhea, or constipation. He denies any headache or visual changes. The patient denies any rashes or skin changes. The patient is here today for evaluation before starting cycle #2.   MEDICAL HISTORY: Past Medical History:  Diagnosis Date  . A-fib ([facility]    s/p DCCV 12/07/18  . [place]   . Depression    situational  . Dysrhythmia   . Hypertension     ALLERGIES:  has No Known Allergies.  MEDICATIONS:  Current Outpatient Medications  Medication Sig Dispense Refill  . HYDROcodone-acetaminophen (HYCET) 7.5-325 mg/15 ml solution Take 10 mLs by mouth 4 (four) times daily as needed for moderate pain. 120 mL 0  . ibuprofen (ADVIL) 200 MG tablet Take 400 mg by mouth every 8 (eight) hours as needed (for pain).     .Marland Kitchenlidocaine (XYLOCAINE) 2 % solution Use as directed 15 mLs in the mouth or throat every 3 (three) hours as needed for mouth pain. 200 mL 0  . metoprolol tartrate (LOPRESSOR) 50 MG tablet Take 50 mg by mouth 2 (two) times daily with a meal.    . mucosal barrier oral (GELCLAIR) GEL Mix 1 packet in about 8 oz of water and drink TID 21 packet 1  . Omeprazole (PRILOSEC PO) Take 20 mg by mouth.     . prochlorperazine (COMPAZINE) 10 MG tablet Take 1 tablet (10 mg total) by mouth  every 6 (six) hours as needed. (Patient not taking: Reported on 11/16/2019) 30 tablet 2  . sucralfate (CARAFATE) 1 g tablet Crush and mix in 1 oz water and drink 5 min TID before meals and at HS for radiation induced esophagitis 120 tablet 2  . XARELTO 20 MG TABS tablet Take 20 mg by mouth daily in the afternoon.      Current Facility-Administered Medications  Medication Dose Route  Frequency Provider Last Rate Last Admin  . 0.9 %  sodium chloride infusion  500 mL Intravenous Continuous Thornton Park, MD      . 0.9 %  sodium chloride infusion  500 mL Intravenous Once Thornton Park, MD        SURGICAL HISTORY:  Past Surgical History:  Procedure Laterality Date  . CARDIOVERSION N/A 12/07/2018   Procedure: CARDIOVERSION;  Surgeon: Josue Hector, MD;  Location: [facility] ENDOSCOPY;  Service: Cardiovascular;  Laterality: N/A;  . FINE NEEDLE ASPIRATION  10/30/2019   Procedure: FINE NEEDLE ASPIRATION (FNA) LINEAR;  Surgeon: Candee Furbish, MD;  Location: [facility] ENDOSCOPY;  Service: Pulmonary;;  . INGUINAL HERNIA REPAIR Right 01/25/2019   Procedure: RIGHT INGUINAL HERNIA REPAIR WITH MESH;  Surgeon: Coralie Keens, MD;  Location: [place];  Service: General;  Laterality: Right;  TAP BLOCK  . INSERTION OF MESH Right 01/25/2019   Procedure: Insertion Of Mesh;  Surgeon: Coralie Keens, MD;  Location: [place];  Service: General;  Laterality: Right;  Marland Kitchen VIDEO BRONCHOSCOPY WITH ENDOBRONCHIAL ULTRASOUND N/A 10/30/2019   Procedure: VIDEO BRONCHOSCOPY WITH ENDOBRONCHIAL ULTRASOUND;  Surgeon: Candee Furbish, MD;  Location: [facility] ENDOSCOPY;  Service: Pulmonary;  Laterality: N/A;    REVIEW OF SYSTEMS:   Review of Systems  Constitutional: Negative for appetite change, chills, fatigue, fever and unexpected weight change.  HENT: Negative for mouth sores, nosebleeds, sore throat and trouble swallowing.   Eyes: Negative for eye problems and icterus.  Respiratory: Positive for mild dyspnea on exertion. Negative for cough, hemoptysis, and wheezing.   Cardiovascular: Negative for chest pain and leg swelling.  Gastrointestinal: Negative for abdominal pain, constipation, diarrhea, nausea and vomiting.  Genitourinary: Negative for bladder incontinence, difficulty urinating, dysuria, frequency and hematuria.   Musculoskeletal: Negative for back pain, gait problem, neck pain and neck stiffness.  Skin:  Negative for itching and rash.  Neurological: Negative for dizziness, extremity weakness, gait problem, headaches, light-headedness and seizures.  Hematological: Negative for adenopathy. Does not bruise/bleed easily.  Psychiatric/Behavioral: Negative for confusion, depression and sleep disturbance. The patient is not nervous/anxious.     PHYSICAL EXAMINATION:  Blood pressure 129/83, pulse 66, temperature (!) 97.1 F (36.2 C), temperature source Tympanic, resp. rate 18, height 6' 1" (1.854 m), weight 159 lb 4.8 oz (72.3 kg), SpO2 100 %.  ECOG PERFORMANCE STATUS: 1 - Symptomatic but completely ambulatory  Physical Exam  Constitutional: Oriented to person, place, and time and well-developed, well-nourished, and in no distress. No distress.  HENT:  Head: Normocephalic and atraumatic.  Mouth/Throat: Oropharynx is clear and moist. No oropharyngeal exudate.  Eyes: Conjunctivae are normal. Right eye exhibits no discharge. Left eye exhibits no discharge. No scleral icterus.  Neck: Normal range of motion. Neck supple.  Cardiovascular: Normal rate, regular rhythm, normal heart sounds and intact distal pulses.   Pulmonary/Chest: Effort normal and breath sounds normal. No respiratory distress. No wheezes. No rales.  Abdominal: Soft. Bowel sounds are normal. Exhibits no distension and no mass. There is no tenderness. No rebound tenderness. Negative murphy's sign.  Musculoskeletal: Normal range  of motion. Exhibits no edema.  Lymphadenopathy:    No cervical adenopathy.  Neurological: Alert and oriented to person, place, and time. Exhibits normal muscle tone. Gait normal. Coordination normal.  Skin: Skin is warm and dry. No rash noted. Not diaphoretic. No erythema. No pallor.  Psychiatric: Mood, memory and judgment normal.  Vitals reviewed.  LABORATORY DATA: Lab Results  Component Value Date   WBC 3.7 (L) 03/24/2020   HGB 13.2 03/24/2020   HCT 37.5 (L) 03/24/2020   MCV 96.2 03/24/2020   PLT 179  03/24/2020      Chemistry      Component Value Date/Time   NA 132 (L) 03/24/2020 1116   NA 129 (L) 11/24/2018 1520   K 4.3 03/24/2020 1116   CL 101 03/24/2020 1116   CO2 21 (L) 03/24/2020 1116   BUN 5 (L) 03/24/2020 1116   BUN 8 11/24/2018 1520   CREATININE 0.67 03/24/2020 1116      Component Value Date/Time   CALCIUM 9.3 03/24/2020 1116   ALKPHOS 51 03/24/2020 1116   AST 21 03/24/2020 1116   ALT 7 03/24/2020 1116   BILITOT 0.3 03/24/2020 1116       RADIOGRAPHIC STUDIES:  No results found.   ASSESSMENT/PLAN:  This is a very pleasant 69 year old Caucasian male diagnosed with stage IIIa/B (T3, N1/N2, M0) non-small cell lung cancer, adenocarcinoma presented with right upper lobe lung mass with direct extension across the major fissure into the superior segment of the right lower lobe and ipsilateral hilar lymphadenopathy as well as questionable subcarinal lymphadenopathy. He was diagnosed in May 2021. 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   The patient completed 7 cycles of weekly concurrent chemoradiation with carboplatin for an AUC of 2 and paclitaxel 45 mg per metered squared. He had a partial response to treatment.  The patient is currently undergoing consolidation immunotherapy with Imfinzi 1500 mg IV every 4 weeks. The patient is status post 1 cycle and tolerated it well without any adverse side effects.   Labs were reviewed. Recommend that he proceed with cycle #2 today as scheduled.  We will see him back for follow-up visit in 4 weeks for evaluation before starting cycle #3.  The patient was advised to call immediately if he has any concerning symptoms in the interval. The patient voices understanding of current disease status and treatment options and is in agreement with the current care plan. All questions were answered. The patient knows to call the clinic with any problems, questions or concerns. We can certainly see the patient much sooner if  necessary     No orders of the defined types were placed in this encounter.    Julieth Tugman L Xolani Degracia, PA-C 03/24/20

## 2020-03-24 ENCOUNTER — Inpatient Hospital Stay: Payer: Medicare Other | Attending: Physician Assistant

## 2020-03-24 ENCOUNTER — Inpatient Hospital Stay: Payer: Medicare Other

## 2020-03-24 ENCOUNTER — Inpatient Hospital Stay (HOSPITAL_BASED_OUTPATIENT_CLINIC_OR_DEPARTMENT_OTHER): Payer: Medicare Other | Admitting: Physician Assistant

## 2020-03-24 ENCOUNTER — Other Ambulatory Visit: Payer: Self-pay

## 2020-03-24 VITALS — BP 129/83 | HR 66 | Temp 97.1°F | Resp 18 | Ht 73.0 in | Wt 159.3 lb

## 2020-03-24 DIAGNOSIS — Z7901 Long term (current) use of anticoagulants: Secondary | ICD-10-CM | POA: Insufficient documentation

## 2020-03-24 DIAGNOSIS — Z923 Personal history of irradiation: Secondary | ICD-10-CM | POA: Insufficient documentation

## 2020-03-24 DIAGNOSIS — Z79899 Other long term (current) drug therapy: Secondary | ICD-10-CM | POA: Insufficient documentation

## 2020-03-24 DIAGNOSIS — Z5112 Encounter for antineoplastic immunotherapy: Secondary | ICD-10-CM | POA: Diagnosis not present

## 2020-03-24 DIAGNOSIS — C3491 Malignant neoplasm of unspecified part of right bronchus or lung: Secondary | ICD-10-CM

## 2020-03-24 DIAGNOSIS — I4891 Unspecified atrial fibrillation: Secondary | ICD-10-CM | POA: Insufficient documentation

## 2020-03-24 DIAGNOSIS — C3411 Malignant neoplasm of upper lobe, right bronchus or lung: Secondary | ICD-10-CM | POA: Diagnosis not present

## 2020-03-24 DIAGNOSIS — C3431 Malignant neoplasm of lower lobe, right bronchus or lung: Secondary | ICD-10-CM | POA: Insufficient documentation

## 2020-03-24 DIAGNOSIS — I10 Essential (primary) hypertension: Secondary | ICD-10-CM | POA: Insufficient documentation

## 2020-03-24 DIAGNOSIS — Z5111 Encounter for antineoplastic chemotherapy: Secondary | ICD-10-CM | POA: Insufficient documentation

## 2020-03-24 LAB — CMP (CANCER CENTER ONLY)
ALT: 7 U/L (ref 0–44)
AST: 21 U/L (ref 15–41)
Albumin: 3.2 g/dL — ABNORMAL LOW (ref 3.5–5.0)
Alkaline Phosphatase: 51 U/L (ref 38–126)
Anion gap: 10 (ref 5–15)
BUN: 5 mg/dL — ABNORMAL LOW (ref 8–23)
CO2: 21 mmol/L — ABNORMAL LOW (ref 22–32)
Calcium: 9.3 mg/dL (ref 8.9–10.3)
Chloride: 101 mmol/L (ref 98–111)
Creatinine: 0.67 mg/dL (ref 0.61–1.24)
GFR, Estimated: >60 mL/min (ref >60)
Glucose, Bld: 77 mg/dL (ref 70–99)
Potassium: 4.3 mmol/L (ref 3.5–5.1)
Sodium: 132 mmol/L — ABNORMAL LOW (ref 135–145)
Total Bilirubin: 0.3 mg/dL (ref 0.3–1.2)
Total Protein: 7.1 g/dL (ref 6.5–8.1)

## 2020-03-24 LAB — CBC WITH DIFFERENTIAL (CANCER CENTER ONLY)
Abs Immature Granulocytes: 0.01 10*3/uL (ref 0.00–0.07)
Basophils Absolute: 0 10*3/uL (ref 0.0–0.1)
Basophils Relative: 1 %
Eosinophils Absolute: 0.1 10*3/uL (ref 0.0–0.5)
Eosinophils Relative: 2 %
HCT: 37.5 % — ABNORMAL LOW (ref 39.0–52.0)
Hemoglobin: 13.2 g/dL (ref 13.0–17.0)
Immature Granulocytes: 0 %
Lymphocytes Relative: 14 %
Lymphs Abs: 0.5 10*3/uL — ABNORMAL LOW (ref 0.7–4.0)
MCH: 33.8 pg (ref 26.0–34.0)
MCHC: 35.2 g/dL (ref 30.0–36.0)
MCV: 96.2 fL (ref 80.0–100.0)
Monocytes Absolute: 0.5 10*3/uL (ref 0.1–1.0)
Monocytes Relative: 12 %
Neutro Abs: 2.6 10*3/uL (ref 1.7–7.7)
Neutrophils Relative %: 71 %
Platelet Count: 179 10*3/uL (ref 150–400)
RBC: 3.9 MIL/uL — ABNORMAL LOW (ref 4.22–5.81)
RDW: 12.4 % (ref 11.5–15.5)
WBC Count: 3.7 10*3/uL — ABNORMAL LOW (ref 4.0–10.5)
nRBC: 0 % (ref 0.0–0.2)

## 2020-03-24 LAB — TSH: TSH: 1.155 u[IU]/mL (ref 0.320–4.118)

## 2020-03-24 MED ORDER — SODIUM CHLORIDE 0.9 % IV SOLN
1500.0000 mg | Freq: Once | INTRAVENOUS | Status: AC
  Administered 2020-03-24: 1500 mg via INTRAVENOUS
  Filled 2020-03-24: qty 30

## 2020-03-24 MED ORDER — SODIUM CHLORIDE 0.9 % IV SOLN
Freq: Once | INTRAVENOUS | Status: AC
  Filled 2020-03-24: qty 250

## 2020-03-24 NOTE — Patient Instructions (Signed)
[facility] Discharge Instructions for Patients Receiving Chemotherapy  Today you received the following chemotherapy agents: durvalumab (Imfinzi).  To help prevent nausea and vomiting after your treatment, we encourage you to take your nausea medication as directed.   If you develop nausea and vomiting that is not controlled by your nausea medication, call the clinic.   BELOW ARE SYMPTOMS THAT SHOULD BE REPORTED IMMEDIATELY:  *FEVER GREATER THAN 100.5 F  *CHILLS WITH OR WITHOUT FEVER  NAUSEA AND VOMITING THAT IS NOT CONTROLLED WITH YOUR NAUSEA MEDICATION  *UNUSUAL SHORTNESS OF BREATH  *UNUSUAL BRUISING OR BLEEDING  TENDERNESS IN MOUTH AND THROAT WITH OR WITHOUT PRESENCE OF ULCERS  *URINARY PROBLEMS  *BOWEL PROBLEMS  UNUSUAL RASH Items with * indicate a potential emergency and should be followed up as soon as possible.  Feel free to call the clinic should you have any questions or concerns. The clinic phone number is [phone].  Please show the [facility] at check-in to the Emergency Department and triage nurse.  Durvalumab injection What is this medicine? DURVALUMAB (dur VAL ue mab) is a monoclonal antibody. It is used to treat urothelial cancer and lung cancer. This medicine may be used for other purposes; ask your health care provider or pharmacist if you have questions. COMMON BRAND NAME(S): IMFINZI What should I tell my health care provider before I take this medicine? They need to know if you have any of these conditions:  diabetes  immune system problems  infection  inflammatory bowel disease  kidney disease  liver disease  lung or breathing disease  lupus  organ transplant  stomach or intestine problems  thyroid disease  an unusual or allergic reaction to durvalumab, other medicines, foods, dyes, or preservatives  pregnant or trying to get pregnant  breast-feeding How should I use this medicine? This medicine is  for infusion into a vein. It is given by a health care professional in a hospital or clinic setting. A special MedGuide will be given to you before each treatment. Be sure to read this information carefully each time. Talk to your pediatrician regarding the use of this medicine in children. Special care may be needed. Overdosage: If you think you have taken too much of this medicine contact a poison control center or emergency room at once. NOTE: This medicine is only for you. Do not share this medicine with others. What if I miss a dose? It is important not to miss your dose. Call your doctor or health care professional if you are unable to keep an appointment. What may interact with this medicine? Interactions have not been studied. This list may not describe all possible interactions. Give your health care provider a list of all the medicines, herbs, non-prescription drugs, or dietary supplements you use. Also tell them if you smoke, drink alcohol, or use illegal drugs. Some items may interact with your medicine. What should I watch for while using this medicine? This drug may make you feel generally unwell. Continue your course of treatment even though you feel ill unless your doctor tells you to stop. You may need blood work done while you are taking this medicine. Do not become pregnant while taking this medicine or for 3 months after stopping it. Women should inform their doctor if they wish to become pregnant or think they might be pregnant. There is a potential for serious side effects to an unborn child. Talk to your health care professional or pharmacist for more information. Do not  breast-feed an infant while taking this medicine or for 3 months after stopping it. What side effects may I notice from receiving this medicine? Side effects that you should report to your doctor or health care professional as soon as possible:  allergic reactions like skin rash, itching or hives, swelling of  the face, lips, or tongue  black, tarry stools  bloody or watery diarrhea  breathing problems  change in emotions or moods  change in sex drive  changes in vision  chest pain or chest tightness  chills  confusion  cough  facial flushing  fever  headache  signs and symptoms of high blood sugar such as dizziness; dry mouth; dry skin; fruity breath; nausea; stomach pain; increased hunger or thirst; increased urination  signs and symptoms of liver injury like dark yellow or brown urine; general ill feeling or flu-like symptoms; light-colored stools; loss of appetite; nausea; right upper belly pain; unusually weak or tired; yellowing of the eyes or skin  stomach pain  trouble passing urine or change in the amount of urine  weight gain or weight loss Side effects that usually do not require medical attention (report these to your doctor or health care professional if they continue or are bothersome):  bone pain  constipation  loss of appetite  muscle pain  nausea  swelling of the ankles, feet, hands  tiredness This list may not describe all possible side effects. Call your doctor for medical advice about side effects. You may report side effects to FDA at 1-800-FDA-1088. Where should I keep my medicine? This drug is given in a hospital or clinic and will not be stored at home. NOTE: This sheet is a summary. It may not cover all possible information. If you have questions about this medicine, talk to your doctor, pharmacist, or health care provider.  2020 Elsevier/Gold Standard (2016-08-03 19:25:04)

## 2020-04-15 ENCOUNTER — Ambulatory Visit: Payer: Medicare Other | Admitting: Internal Medicine

## 2020-04-15 ENCOUNTER — Ambulatory Visit: Payer: Medicare Other

## 2020-04-15 ENCOUNTER — Other Ambulatory Visit: Payer: Medicare Other

## 2020-04-22 ENCOUNTER — Encounter: Payer: Self-pay | Admitting: Internal Medicine

## 2020-04-22 ENCOUNTER — Inpatient Hospital Stay: Payer: Medicare Other

## 2020-04-22 ENCOUNTER — Inpatient Hospital Stay: Payer: Medicare Other | Attending: Physician Assistant | Admitting: Internal Medicine

## 2020-04-22 ENCOUNTER — Other Ambulatory Visit: Payer: Self-pay

## 2020-04-22 VITALS — BP 154/86 | HR 61 | Temp 98.5°F | Resp 19 | Ht 73.0 in | Wt 160.6 lb

## 2020-04-22 DIAGNOSIS — C3491 Malignant neoplasm of unspecified part of right bronchus or lung: Secondary | ICD-10-CM

## 2020-04-22 DIAGNOSIS — Z79899 Other long term (current) drug therapy: Secondary | ICD-10-CM | POA: Insufficient documentation

## 2020-04-22 DIAGNOSIS — C3490 Malignant neoplasm of unspecified part of unspecified bronchus or lung: Secondary | ICD-10-CM

## 2020-04-22 DIAGNOSIS — Z5112 Encounter for antineoplastic immunotherapy: Secondary | ICD-10-CM

## 2020-04-22 DIAGNOSIS — C3411 Malignant neoplasm of upper lobe, right bronchus or lung: Secondary | ICD-10-CM | POA: Insufficient documentation

## 2020-04-22 DIAGNOSIS — Z9221 Personal history of antineoplastic chemotherapy: Secondary | ICD-10-CM | POA: Diagnosis not present

## 2020-04-22 DIAGNOSIS — I10 Essential (primary) hypertension: Secondary | ICD-10-CM | POA: Diagnosis not present

## 2020-04-22 LAB — CMP (CANCER CENTER ONLY)
ALT: <6 U/L (ref 0–44)
AST: 21 U/L (ref 15–41)
Albumin: 3.5 g/dL (ref 3.5–5.0)
Alkaline Phosphatase: 42 U/L (ref 38–126)
Anion gap: 8 (ref 5–15)
BUN: 6 mg/dL — ABNORMAL LOW (ref 8–23)
CO2: 23 mmol/L (ref 22–32)
Calcium: 9 mg/dL (ref 8.9–10.3)
Chloride: 100 mmol/L (ref 98–111)
Creatinine: 0.7 mg/dL (ref 0.61–1.24)
GFR, Estimated: >60 mL/min (ref >60)
Glucose, Bld: 96 mg/dL (ref 70–99)
Potassium: 4.5 mmol/L (ref 3.5–5.1)
Sodium: 131 mmol/L — ABNORMAL LOW (ref 135–145)
Total Bilirubin: 0.5 mg/dL (ref 0.3–1.2)
Total Protein: 7.1 g/dL (ref 6.5–8.1)

## 2020-04-22 LAB — CBC WITH DIFFERENTIAL (CANCER CENTER ONLY)
Abs Immature Granulocytes: 0 10*3/uL (ref 0.00–0.07)
Basophils Absolute: 0 10*3/uL (ref 0.0–0.1)
Basophils Relative: 1 %
Eosinophils Absolute: 0.1 10*3/uL (ref 0.0–0.5)
Eosinophils Relative: 1 %
HCT: 39.1 % (ref 39.0–52.0)
Hemoglobin: 13.8 g/dL (ref 13.0–17.0)
Immature Granulocytes: 0 %
Lymphocytes Relative: 11 %
Lymphs Abs: 0.5 10*3/uL — ABNORMAL LOW (ref 0.7–4.0)
MCH: 33.7 pg (ref 26.0–34.0)
MCHC: 35.3 g/dL (ref 30.0–36.0)
MCV: 95.6 fL (ref 80.0–100.0)
Monocytes Absolute: 0.7 10*3/uL (ref 0.1–1.0)
Monocytes Relative: 14 %
Neutro Abs: 3.3 10*3/uL (ref 1.7–7.7)
Neutrophils Relative %: 73 %
Platelet Count: 173 10*3/uL (ref 150–400)
RBC: 4.09 MIL/uL — ABNORMAL LOW (ref 4.22–5.81)
RDW: 12.5 % (ref 11.5–15.5)
WBC Count: 4.5 10*3/uL (ref 4.0–10.5)
nRBC: 0 % (ref 0.0–0.2)

## 2020-04-22 LAB — TSH: TSH: 1.01 u[IU]/mL (ref 0.320–4.118)

## 2020-04-22 MED ORDER — SODIUM CHLORIDE 0.9 % IV SOLN
Freq: Once | INTRAVENOUS | Status: AC
  Filled 2020-04-22: qty 250

## 2020-04-22 MED ORDER — SODIUM CHLORIDE 0.9 % IV SOLN
1500.0000 mg | Freq: Once | INTRAVENOUS | Status: AC
  Administered 2020-04-22: 1500 mg via INTRAVENOUS
  Filled 2020-04-22: qty 30

## 2020-04-22 NOTE — Progress Notes (Signed)
[facility] [phone]   [phone]  OFFICE PROGRESS NOTE  Lujean Amel, MD [address] [place] [address]  DIAGNOSIS: stage IIIA/B (T3, N1/N2, M0)non-small cell lung cancer, adenocarcinoma. He presented with a right upper lobe lung mass with direct extension across the major fissure into the superior segment of the right lower lobe and ipsilateral hilar lymphadenopathy. There is questionable subcarinal lymphadenopathy versus esophageal hypermetabolism this was evaluated by gastroenterology with upper endoscopy on 01/30/2020 and there was no mass lesion or malignancy in that area. The patient was diagnosed in May 2021.  Biomarker Findings Microsatellite status - MS-Stable Tumor Mutational Burden - 9 Muts/Mb Genomic Findings For a complete list of the genes assayed, please refer to the Appendix. KRAS G12C CDKN2A/B p16INK4a S12* NKX2-1 amplification - equivocal? PMS2 R3Q TP53 P24fs*38 7 Disease relevant genes with no reportable alterations: ALK, BRAF, EGFR, ERBB2, MET, RET, ROS1  PDL1: 70%  PRIOR THERAPY: Weekly concurrent chemoradiation with carboplatin for an AUC of 2 and paclitaxel 45 mg/m. First dose expected on 11/26/2019. Status post 7 cycles.   Last dose was giving 01/07/2020 with partial response.   CURRENT THERAPY:  Consolidation treatment with immunotherapy with Imfinzi 1500 mg IV every 4 weeks.  First dose 02/19/2020. Status post 2 cycles.  INTERVAL HISTORY: Christopher Harding 69 y.o. male returns to the clinic today for follow-up visit.  The patient is feeling fine today except for feeling of shortness of breath even when sitting down but his oxygen saturation was 100%.  He has occasional tenderness on the right side of the chest.  He denied having any cough or hemoptysis.  He denied having any nausea, vomiting, diarrhea or constipation.  He denied having any headache or visual changes.  He tolerated the  second cycle of his consolidation treatment with Imfinzi fairly well.  The patient is here today for evaluation before starting cycle #3.   MEDICAL HISTORY: Past Medical History:  Diagnosis Date  . A-fib [facility])    s/p DCCV 12/07/18  . Cancer ([facility])   . Depression    situational  . Dysrhythmia   . Hypertension     ALLERGIES:  has No Known Allergies.  MEDICATIONS:  Current Outpatient Medications  Medication Sig Dispense Refill  . HYDROcodone-acetaminophen (HYCET) 7.5-325 mg/15 ml solution Take 10 mLs by mouth 4 (four) times daily as needed for moderate pain. 120 mL 0  . ibuprofen (ADVIL) 200 MG tablet Take 400 mg by mouth every 8 (eight) hours as needed (for pain).     Marland Kitchen lidocaine (XYLOCAINE) 2 % solution Use as directed 15 mLs in the mouth or throat every 3 (three) hours as needed for mouth pain. 200 mL 0  . metoprolol tartrate (LOPRESSOR) 50 MG tablet Take 50 mg by mouth 2 (two) times daily with a meal.    . mucosal barrier oral (GELCLAIR) GEL Mix 1 packet in about 8 oz of water and drink TID 21 packet 1  . Omeprazole (PRILOSEC PO) Take 20 mg by mouth.     . prochlorperazine (COMPAZINE) 10 MG tablet Take 1 tablet (10 mg total) by mouth every 6 (six) hours as needed. (Patient not taking: Reported on 11/16/2019) 30 tablet 2  . sucralfate (CARAFATE) 1 g tablet Crush and mix in 1 oz water and drink 5 min TID before meals and at HS for radiation induced esophagitis 120 tablet 2  . XARELTO 20 MG TABS tablet Take 20 mg by  mouth daily in the afternoon.      Current Facility-Administered Medications  Medication Dose Route Frequency Provider Last Rate Last Admin  . 0.9 %  sodium chloride infusion  500 mL Intravenous Continuous Thornton Park, MD      . 0.9 %  sodium chloride infusion  500 mL Intravenous Once Thornton Park, MD        SURGICAL HISTORY:  Past Surgical History:  Procedure Laterality Date  . CARDIOVERSION N/A 12/07/2018   Procedure: CARDIOVERSION;  Surgeon: Josue Hector,  MD;  Location: [facility] ENDOSCOPY;  Service: Cardiovascular;  Laterality: N/A;  . FINE NEEDLE ASPIRATION  10/30/2019   Procedure: FINE NEEDLE ASPIRATION (FNA) LINEAR;  Surgeon: Candee Furbish, MD;  Location: [facility] ENDOSCOPY;  Service: Pulmonary;;  . INGUINAL HERNIA REPAIR Right 01/25/2019   Procedure: RIGHT INGUINAL HERNIA REPAIR WITH MESH;  Surgeon: Coralie Keens, MD;  Location: [facility];  Service: General;  Laterality: Right;  TAP BLOCK  . INSERTION OF MESH Right 01/25/2019   Procedure: Insertion Of Mesh;  Surgeon: Coralie Keens, MD;  Location: [facility];  Service: General;  Laterality: Right;  Marland Kitchen VIDEO BRONCHOSCOPY WITH ENDOBRONCHIAL ULTRASOUND N/A 10/30/2019   Procedure: VIDEO BRONCHOSCOPY WITH ENDOBRONCHIAL ULTRASOUND;  Surgeon: Candee Furbish, MD;  Location: [facility] ENDOSCOPY;  Service: Pulmonary;  Laterality: N/A;    REVIEW OF SYSTEMS:  A comprehensive review of systems was negative except for: Respiratory: positive for dyspnea on exertion and pleurisy/chest pain   PHYSICAL EXAMINATION: General appearance: alert, cooperative and no distress Head: Normocephalic, without obvious abnormality, atraumatic Neck: no adenopathy, no JVD, supple, symmetrical, trachea midline and thyroid not enlarged, symmetric, no tenderness/mass/nodules Lymph nodes: Cervical, supraclavicular, and axillary nodes normal. Resp: clear to auscultation bilaterally Back: symmetric, no curvature. ROM normal. No CVA tenderness. Cardio: regular rate and rhythm, S1, S2 normal, no murmur, click, rub or gallop GI: soft, non-tender; bowel sounds normal; no masses,  no organomegaly Extremities: extremities normal, atraumatic, no cyanosis or edema  ECOG PERFORMANCE STATUS: 1 - Symptomatic but completely ambulatory  Blood pressure (!) 154/86, pulse 61, temperature 98.5 F (36.9 C), temperature source Tympanic, resp. rate 19, height $RemoveBe'6\' 1"'oUhnQxtLr$  (1.854 m), weight 160 lb 9.6 oz (72.8 kg), SpO2 100 %.  LABORATORY DATA: Lab Results  Component Value  Date   WBC 3.7 (L) 03/24/2020   HGB 13.2 03/24/2020   HCT 37.5 (L) 03/24/2020   MCV 96.2 03/24/2020   PLT 179 03/24/2020      Chemistry      Component Value Date/Time   NA 132 (L) 03/24/2020 1116   NA 129 (L) 11/24/2018 1520   K 4.3 03/24/2020 1116   CL 101 03/24/2020 1116   CO2 21 (L) 03/24/2020 1116   BUN 5 (L) 03/24/2020 1116   BUN 8 11/24/2018 1520   CREATININE 0.67 03/24/2020 1116      Component Value Date/Time   CALCIUM 9.3 03/24/2020 1116   ALKPHOS 51 03/24/2020 1116   AST 21 03/24/2020 1116   ALT 7 03/24/2020 1116   BILITOT 0.3 03/24/2020 1116       RADIOGRAPHIC STUDIES: No results found.  ASSESSMENT AND PLAN: This is a very pleasant 69 years old white male with a stage IIIA/B (T3, N1/N2, M0) non-small cell lung cancer, adenocarcinoma presented with right upper lobe lung mass with direct extension across the major fissure into the superior segment of the right lower lobe and ipsilateral hilar lymphadenopathy as well as questionable subcarinal lymphadenopathy. The patient completed a course of concurrent chemoradiation  with weekly carboplatin and paclitaxel status post 7 cycles.  He tolerated this treatment well with no concerning adverse effect except for mild sore throat and mouth. The patient had partial response to this treatment. Is currently undergoing a course of consolidation treatment with immunotherapy with Imfinzi 1500 mg IV every 4 weeks status post 2 cycles.  He has been tolerating this treatment well with no concerning adverse effects. I recommended for him to proceed with cycle #3 today as planned. I will see him back for follow-up visit in 4 weeks for evaluation with repeat CT scan of the chest for restaging of his disease. The patient was advised to call immediately if he has any concerning symptoms in the interval. The patient voices understanding of current disease status and treatment options and is in agreement with the current care plan.  All  questions were answered. The patient knows to call the clinic with any problems, questions or concerns. We can certainly see the patient much sooner if necessary.  Disclaimer: This note was dictated with voice recognition software. Similar sounding words can inadvertently be transcribed and may not be corrected upon review.

## 2020-04-22 NOTE — Patient Instructions (Signed)
[facility] Discharge Instructions for Patients Receiving Chemotherapy  Today you received the following chemotherapy agents: durvalumab.  To help prevent nausea and vomiting after your treatment, we encourage you to take your nausea medication as directed.   If you develop nausea and vomiting that is not controlled by your nausea medication, call the clinic.   BELOW ARE SYMPTOMS THAT SHOULD BE REPORTED IMMEDIATELY:  *FEVER GREATER THAN 100.5 F  *CHILLS WITH OR WITHOUT FEVER  NAUSEA AND VOMITING THAT IS NOT CONTROLLED WITH YOUR NAUSEA MEDICATION  *UNUSUAL SHORTNESS OF BREATH  *UNUSUAL BRUISING OR BLEEDING  TENDERNESS IN MOUTH AND THROAT WITH OR WITHOUT PRESENCE OF ULCERS  *URINARY PROBLEMS  *BOWEL PROBLEMS  UNUSUAL RASH Items with * indicate a potential emergency and should be followed up as soon as possible.  Feel free to call the clinic should you have any questions or concerns. The clinic phone number is [phone].  Please show the CHEMO ALERT CARD at check-in to the Emergency Department and triage nurse.   

## 2020-04-24 ENCOUNTER — Telehealth: Payer: Self-pay | Admitting: Internal Medicine

## 2020-04-24 NOTE — Telephone Encounter (Signed)
Scheduled per los. Called and spoke with patients wife. Confirmed appt

## 2020-05-19 ENCOUNTER — Telehealth: Payer: Self-pay | Admitting: Medical Oncology

## 2020-05-19 ENCOUNTER — Encounter (HOSPITAL_COMMUNITY): Payer: Self-pay

## 2020-05-19 ENCOUNTER — Ambulatory Visit (HOSPITAL_COMMUNITY)
Admission: RE | Admit: 2020-05-19 | Discharge: 2020-05-19 | Disposition: A | Discharge status: Home / Self Care | Payer: Medicare Other | Source: Ambulatory Visit | Attending: Internal Medicine | Admitting: Internal Medicine

## 2020-05-19 ENCOUNTER — Other Ambulatory Visit: Payer: Self-pay

## 2020-05-19 DIAGNOSIS — C3490 Malignant neoplasm of unspecified part of unspecified bronchus or lung: Secondary | ICD-10-CM | POA: Diagnosis not present

## 2020-05-19 DIAGNOSIS — J432 Centrilobular emphysema: Secondary | ICD-10-CM | POA: Diagnosis not present

## 2020-05-19 IMAGING — CT CT CHEST W/ CM
2 of 4 series · 12 of 36 positions shown, 15 images · IV contrast (omnipaque)
Comparison: Comparison is made with [DATE].
COMPARISON: Comparison is made with [DATE].

Addendum:
CLINICAL DATA: Non-small cell cancer staging evaluation in this
69-year-old male, currently undergoing chemo and radiotherapy by
report.

EXAM:
CT CHEST WITH CONTRAST
TECHNIQUE: Multidetector CT imaging of the chest was performed during
intravenous contrast administration.
CONTRAST:  75mL OMNIPAQUE IOHEXOL 300 MG/ML  SOLN

[Series 2: axial st · axial · 0.77mm/px · z∈[-372,-28]mm · 9 of 204 slices shown, 12 images]
[im 16/204  mediastinal]
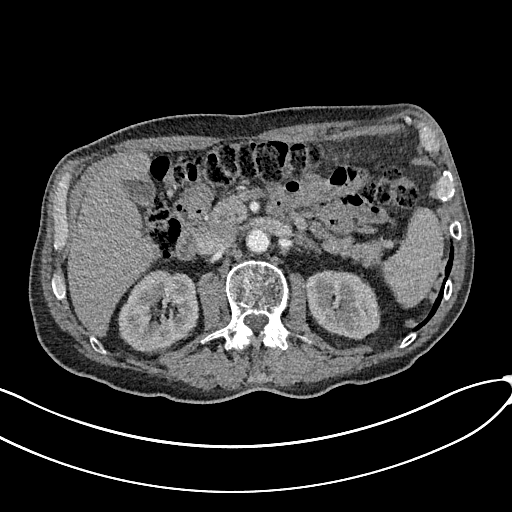
[im 16/204  lung]
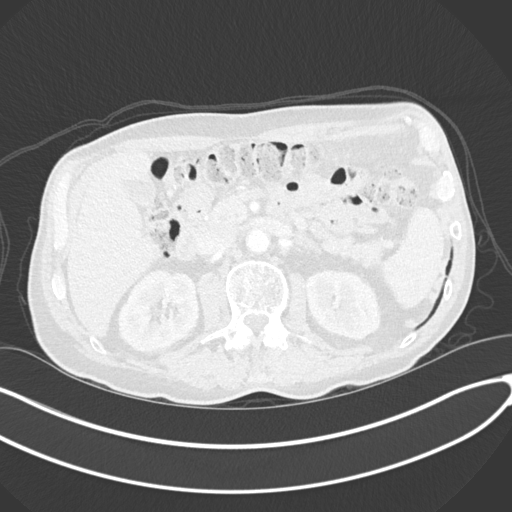
[im 47/204  lung]
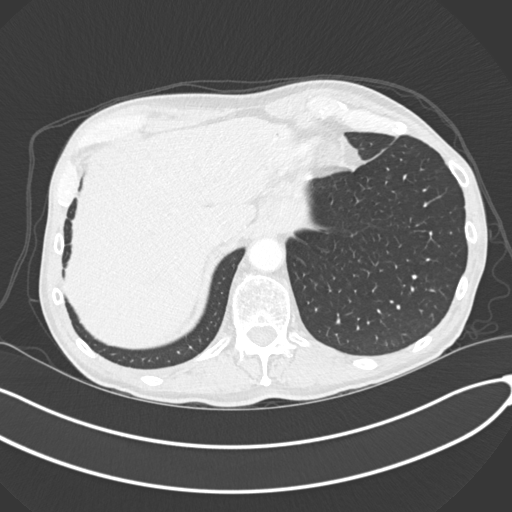
[im 63/204  lung]
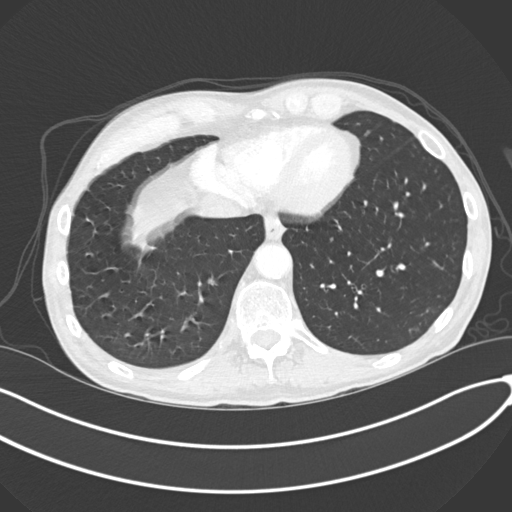
[im 79/204  lung]
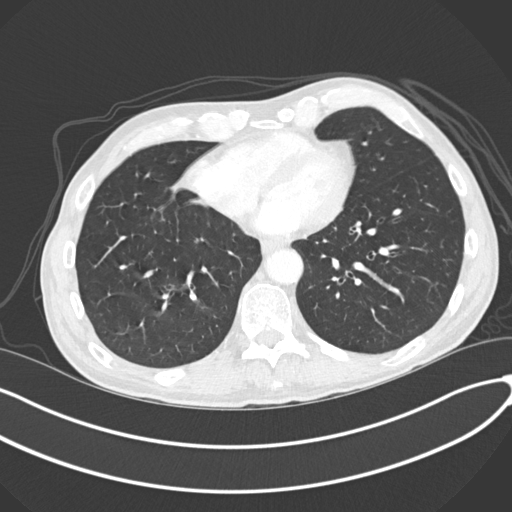
[im 110/204  mediastinal]
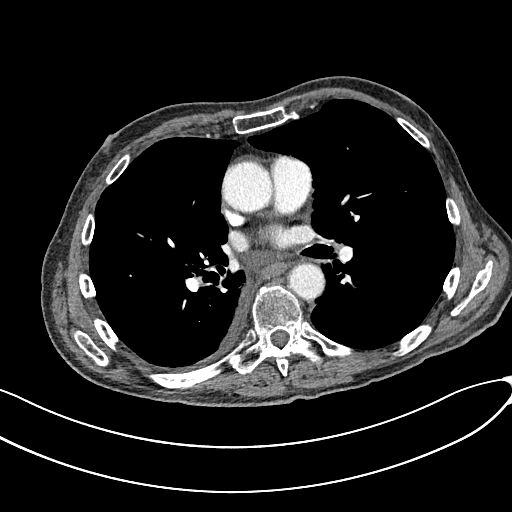
[im 110/204  lung]
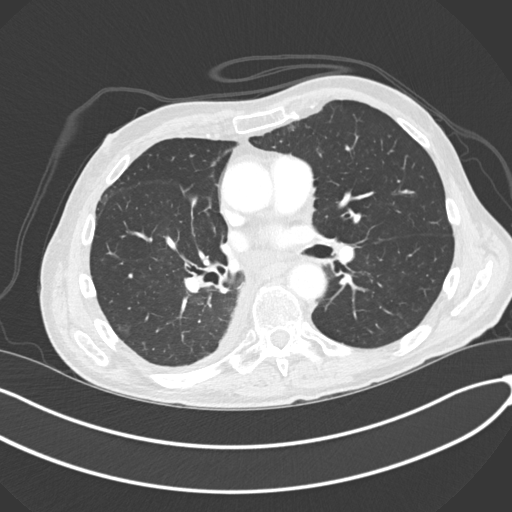
[im 125/204  lung]
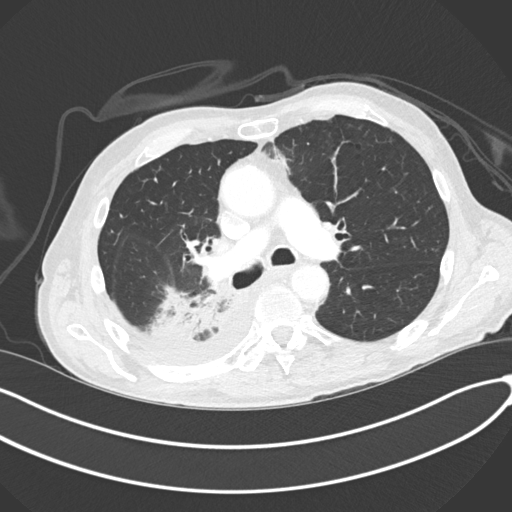
[im 141/204  lung]
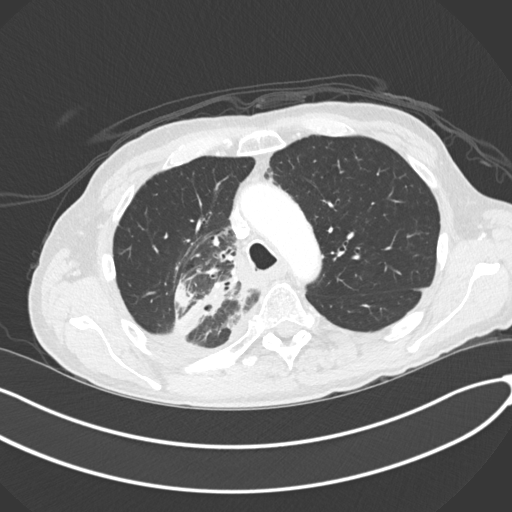
[im 172/204  lung]
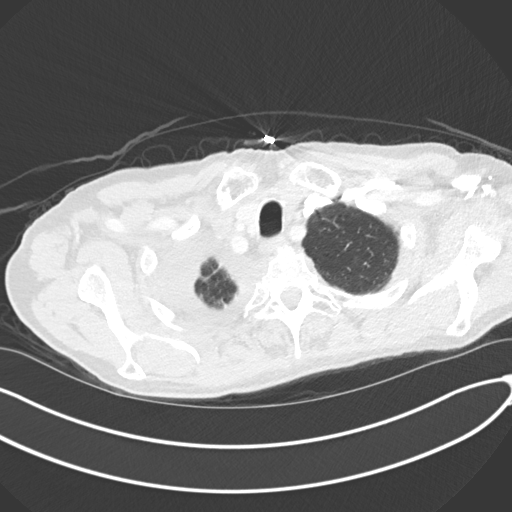
[im 188/204  mediastinal]
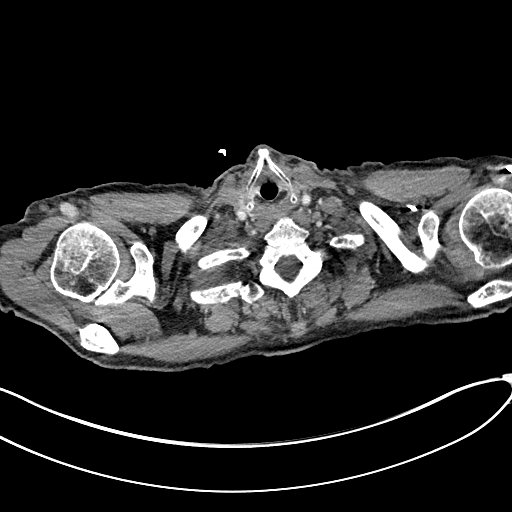
[im 188/204  lung]
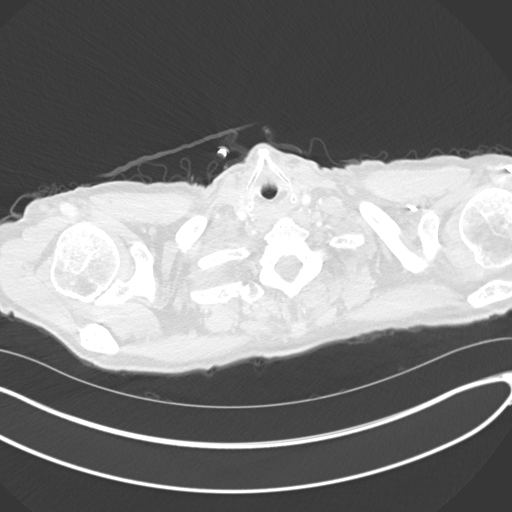

[Series 5: coronal · coronal · 0.81mm/px · 3 of 145 slices shown]
[im 29/145  lung]
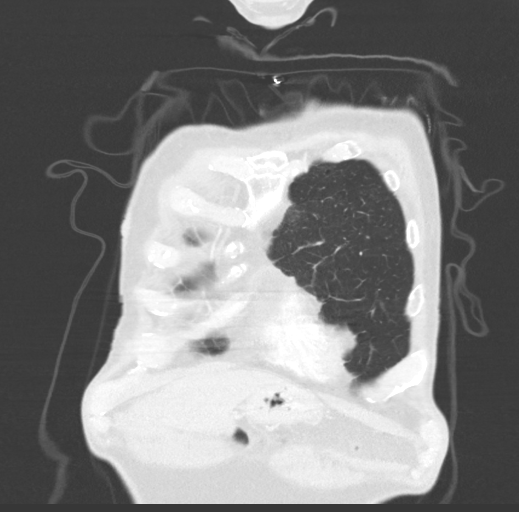
[im 58/145  lung]
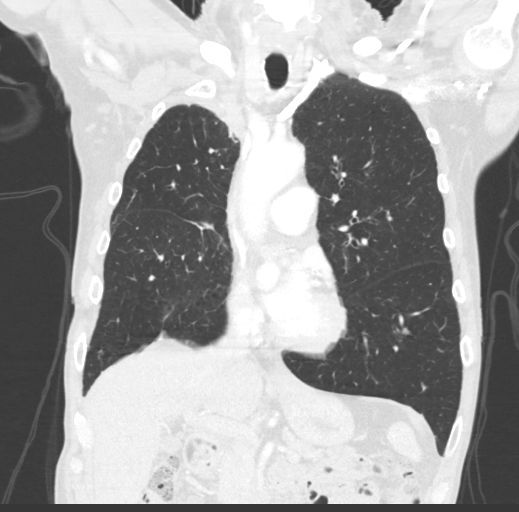
[im 87/145  lung]
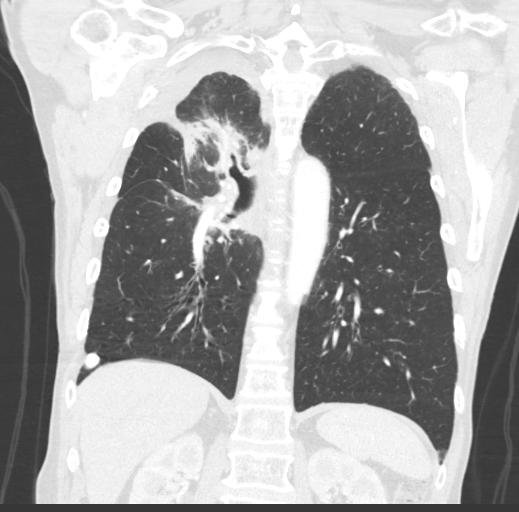

[12 of 36 positions shown; findings below may reference images not displayed]

FINDINGS: Cardiovascular: Calcified atheromatous plaque and noncalcified
plaque in the thoracic aorta. Maximal caliber approximately 3.8 cm
of the ascending thoracic aorta. Central pulmonary vasculature
unremarkable on venous phase assessment. Heart size normal without
pericardial effusion.

Mediastinum/Nodes: Mild circumferential distal esophageal mid
esophageal thickening in the setting of ongoing radiation, may
represent mild esophagitis. No thoracic inlet adenopathy no axillary
adenopathy no discrete nodal enlargement in the chest. More
confluent soft tissue in the RIGHT hilum, see below. This represents
a change compared to previous imaging.

Lungs/Pleura: Volume loss in the RIGHT chest more pronounced than on
the previous study in the RIGHT lower and RIGHT upper lobe.

Area of bronchiectatic changes in the RIGHT upper lobe seen on the
previous study along the major fissure is obscured by developing
consolidative changes septal thickening, with respect imaged
portions this measures approximately 3.7 cm greatest axial dimension
as compared to 4.9 cm.

More confluent volume loss in the superior segment of the RIGHT
lower lobe.

Mild to moderate centrilobular pulmonary emphysema with similar
appearance to the prior study.

Scattered small areas of ill-defined nodularity in the chest is
similar to the prior study. For instance, a 4 mm nodule at the LEFT
lung base with other small scatter predominantly ground-glass
nodules LEFT chest

Interval development of RIGHT-sided pleural fluid in the setting of
volume loss and post treatment change.

Upper Abdomen: New area of low attenuation in the anteromedial LEFT
hepatic lobe just above the inter lobar fissure on image 160 of
series 2. Liver is incompletely imaged. Imaged portions of
gallbladder, pancreas, spleen, adrenal glands and kidneys are
unremarkable.

Musculoskeletal: No acute musculoskeletal process. No destructive
bone finding.
IMPRESSION: 1. Areas of bronchiectatic change in consolidation developing about
the RIGHT hilum extending into the superior segment of the RIGHT
lower lobe and RIGHT upper lobe with decreased masslike appearance
in the RIGHT upper lobe compatible with response to therapy and
evolving post radiation changes.
2. New RIGHT-sided pleural effusion without nodular features,
attention on follow-up. This may be a response to volume loss in the
RIGHT chest.
3. Scattered small nodules in the LEFT chest are unchanged.
4. Subtle area of hypodensity in the anterior medial LEFT hepatic
lobe. This may represent focal fatty infiltration though is
nonspecific. Consider hepatic MRI for further assessment.
5. Mild esophageal thickening in the setting of ongoing radiotherapy
in the chest. This may represent esophagitis and is nonfocal.
6. Emphysema and aortic atherosclerosis.

Aortic Atherosclerosis ([FS]-[FS]) and Emphysema ([FS]-[FS]).

ADDENDUM:
These results will be called to the ordering clinician or
representative by the Radiologist Assistant, and communication
documented in the PACS or [REDACTED].

*** End of Addendum ***
FINDINGS: Cardiovascular: Calcified atheromatous plaque and noncalcified
plaque in the thoracic aorta. Maximal caliber approximately 3.8 cm
of the ascending thoracic aorta. Central pulmonary vasculature
unremarkable on venous phase assessment. Heart size normal without
pericardial effusion.

Mediastinum/Nodes: Mild circumferential distal esophageal mid
esophageal thickening in the setting of ongoing radiation, may
represent mild esophagitis. No thoracic inlet adenopathy no axillary
adenopathy no discrete nodal enlargement in the chest. More
confluent soft tissue in the RIGHT hilum, see below. This represents
a change compared to previous imaging.

Lungs/Pleura: Volume loss in the RIGHT chest more pronounced than on
the previous study in the RIGHT lower and RIGHT upper lobe.

Area of bronchiectatic changes in the RIGHT upper lobe seen on the
previous study along the major fissure is obscured by developing
consolidative changes septal thickening, with respect imaged
portions this measures approximately 3.7 cm greatest axial dimension
as compared to 4.9 cm.

More confluent volume loss in the superior segment of the RIGHT
lower lobe.

Mild to moderate centrilobular pulmonary emphysema with similar
appearance to the prior study.

Scattered small areas of ill-defined nodularity in the chest is
similar to the prior study. For instance, a 4 mm nodule at the LEFT
lung base with other small scatter predominantly ground-glass
nodules LEFT chest

Interval development of RIGHT-sided pleural fluid in the setting of
volume loss and post treatment change.

Upper Abdomen: New area of low attenuation in the anteromedial LEFT
hepatic lobe just above the inter lobar fissure on image 160 of
series 2. Liver is incompletely imaged. Imaged portions of
gallbladder, pancreas, spleen, adrenal glands and kidneys are
unremarkable.

Musculoskeletal: No acute musculoskeletal process. No destructive
bone finding.
IMPRESSION: 1. Areas of bronchiectatic change in consolidation developing about
the RIGHT hilum extending into the superior segment of the RIGHT
lower lobe and RIGHT upper lobe with decreased masslike appearance
in the RIGHT upper lobe compatible with response to therapy and
evolving post radiation changes.
2. New RIGHT-sided pleural effusion without nodular features,
attention on follow-up. This may be a response to volume loss in the
RIGHT chest.
3. Scattered small nodules in the LEFT chest are unchanged.
4. Subtle area of hypodensity in the anterior medial LEFT hepatic
lobe. This may represent focal fatty infiltration though is
nonspecific. Consider hepatic MRI for further assessment.
5. Mild esophageal thickening in the setting of ongoing radiotherapy
in the chest. This may represent esophagitis and is nonfocal.
6. Emphysema and aortic atherosclerosis.

Aortic Atherosclerosis ([FS]-[FS]) and Emphysema ([FS]-[FS]).

## 2020-05-19 MED ORDER — SODIUM CHLORIDE (PF) 0.9 % IJ SOLN
INTRAMUSCULAR | Status: AC
  Filled 2020-05-19: qty 50

## 2020-05-19 MED ORDER — IOHEXOL 300 MG/ML  SOLN
75.0000 mL | Freq: Once | INTRAMUSCULAR | Status: AC | PRN
  Administered 2020-05-19: 75 mL via INTRAVENOUS

## 2020-05-19 NOTE — Telephone Encounter (Signed)
[facility] radiology called report and to specifically look at impression #4-.   "...4. Subtle area of hypodensity in the anterior medial LEFT hepatic lobe. This may represent focal fatty infiltration though is nonspecific. Consider hepatic MRI for further assessment"   Dr Julien Nordmann notified. Pt appt tomorrow.

## 2020-05-20 ENCOUNTER — Encounter: Payer: Self-pay | Admitting: Internal Medicine

## 2020-05-20 ENCOUNTER — Inpatient Hospital Stay: Payer: Medicare Other

## 2020-05-20 ENCOUNTER — Other Ambulatory Visit: Payer: Self-pay

## 2020-05-20 ENCOUNTER — Inpatient Hospital Stay (HOSPITAL_BASED_OUTPATIENT_CLINIC_OR_DEPARTMENT_OTHER): Payer: Medicare Other | Admitting: Internal Medicine

## 2020-05-20 ENCOUNTER — Inpatient Hospital Stay: Payer: Medicare Other | Attending: Physician Assistant

## 2020-05-20 VITALS — BP 140/77 | HR 62 | Temp 98.1°F | Resp 18 | Ht 73.0 in | Wt 162.1 lb

## 2020-05-20 DIAGNOSIS — C3411 Malignant neoplasm of upper lobe, right bronchus or lung: Secondary | ICD-10-CM | POA: Diagnosis not present

## 2020-05-20 DIAGNOSIS — C3431 Malignant neoplasm of lower lobe, right bronchus or lung: Secondary | ICD-10-CM | POA: Diagnosis not present

## 2020-05-20 DIAGNOSIS — Z9221 Personal history of antineoplastic chemotherapy: Secondary | ICD-10-CM | POA: Insufficient documentation

## 2020-05-20 DIAGNOSIS — Z5112 Encounter for antineoplastic immunotherapy: Secondary | ICD-10-CM

## 2020-05-20 DIAGNOSIS — Z79899 Other long term (current) drug therapy: Secondary | ICD-10-CM | POA: Insufficient documentation

## 2020-05-20 DIAGNOSIS — I10 Essential (primary) hypertension: Secondary | ICD-10-CM

## 2020-05-20 DIAGNOSIS — Z923 Personal history of irradiation: Secondary | ICD-10-CM | POA: Diagnosis not present

## 2020-05-20 DIAGNOSIS — I4891 Unspecified atrial fibrillation: Secondary | ICD-10-CM | POA: Diagnosis not present

## 2020-05-20 DIAGNOSIS — C3491 Malignant neoplasm of unspecified part of right bronchus or lung: Secondary | ICD-10-CM

## 2020-05-20 DIAGNOSIS — Z7901 Long term (current) use of anticoagulants: Secondary | ICD-10-CM | POA: Diagnosis not present

## 2020-05-20 LAB — CBC WITH DIFFERENTIAL (CANCER CENTER ONLY)
Abs Immature Granulocytes: 0.01 10*3/uL (ref 0.00–0.07)
Basophils Absolute: 0.1 10*3/uL (ref 0.0–0.1)
Basophils Relative: 2 %
Eosinophils Absolute: 0.1 10*3/uL (ref 0.0–0.5)
Eosinophils Relative: 2 %
HCT: 38.8 % — ABNORMAL LOW (ref 39.0–52.0)
Hemoglobin: 14 g/dL (ref 13.0–17.0)
Immature Granulocytes: 0 %
Lymphocytes Relative: 10 %
Lymphs Abs: 0.4 10*3/uL — ABNORMAL LOW (ref 0.7–4.0)
MCH: 33.4 pg (ref 26.0–34.0)
MCHC: 36.1 g/dL — ABNORMAL HIGH (ref 30.0–36.0)
MCV: 92.6 fL (ref 80.0–100.0)
Monocytes Absolute: 0.6 10*3/uL (ref 0.1–1.0)
Monocytes Relative: 15 %
Neutro Abs: 2.6 10*3/uL (ref 1.7–7.7)
Neutrophils Relative %: 71 %
Platelet Count: 188 10*3/uL (ref 150–400)
RBC: 4.19 MIL/uL — ABNORMAL LOW (ref 4.22–5.81)
RDW: 12.6 % (ref 11.5–15.5)
WBC Count: 3.7 10*3/uL — ABNORMAL LOW (ref 4.0–10.5)
nRBC: 0 % (ref 0.0–0.2)

## 2020-05-20 LAB — CMP (CANCER CENTER ONLY)
ALT: 9 U/L (ref 0–44)
AST: 21 U/L (ref 15–41)
Albumin: 3.6 g/dL (ref 3.5–5.0)
Alkaline Phosphatase: 41 U/L (ref 38–126)
Anion gap: 10 (ref 5–15)
BUN: 4 mg/dL — ABNORMAL LOW (ref 8–23)
CO2: 21 mmol/L — ABNORMAL LOW (ref 22–32)
Calcium: 9.2 mg/dL (ref 8.9–10.3)
Chloride: 100 mmol/L (ref 98–111)
Creatinine: 0.68 mg/dL (ref 0.61–1.24)
GFR, Estimated: >60 mL/min (ref >60)
Glucose, Bld: 80 mg/dL (ref 70–99)
Potassium: 4.6 mmol/L (ref 3.5–5.1)
Sodium: 131 mmol/L — ABNORMAL LOW (ref 135–145)
Total Bilirubin: 0.6 mg/dL (ref 0.3–1.2)
Total Protein: 7.3 g/dL (ref 6.5–8.1)

## 2020-05-20 LAB — TSH: TSH: 1.241 u[IU]/mL (ref 0.320–4.118)

## 2020-05-20 MED ORDER — SODIUM CHLORIDE 0.9 % IV SOLN
Freq: Once | INTRAVENOUS | Status: AC
  Filled 2020-05-20: qty 250

## 2020-05-20 MED ORDER — SODIUM CHLORIDE 0.9 % IV SOLN
1500.0000 mg | Freq: Once | INTRAVENOUS | Status: AC
  Administered 2020-05-20: 1500 mg via INTRAVENOUS
  Filled 2020-05-20: qty 30

## 2020-05-20 NOTE — Progress Notes (Signed)
[facility] [phone]   [phone]  OFFICE PROGRESS NOTE  Lujean Amel, MD [address] [place] [address]  DIAGNOSIS: stage IIIA/B (T3, N1/N2, M0)non-small cell lung cancer, adenocarcinoma. He presented with a right upper lobe lung mass with direct extension across the major fissure into the superior segment of the right lower lobe and ipsilateral hilar lymphadenopathy. There is questionable subcarinal lymphadenopathy versus esophageal hypermetabolism this was evaluated by gastroenterology with upper endoscopy on 01/30/2020 and there was no mass lesion or malignancy in that area. The patient was diagnosed in May 2021.  Biomarker Findings Microsatellite status - MS-Stable Tumor Mutational Burden - 9 Muts/Mb Genomic Findings For a complete list of the genes assayed, please refer to the Appendix. KRAS G12C CDKN2A/B p16INK4a S12* NKX2-1 amplification - equivocal? PMS2 R3Q TP53 P80fs*38 7 Disease relevant genes with no reportable alterations: ALK, BRAF, EGFR, ERBB2, MET, RET, ROS1  PDL1: 70%  PRIOR THERAPY: Weekly concurrent chemoradiation with carboplatin for an AUC of 2 and paclitaxel 45 mg/m. First dose expected on 11/26/2019. Status post 7 cycles.   Last dose was giving 01/07/2020 with partial response.   CURRENT THERAPY:  Consolidation treatment with immunotherapy with Imfinzi 1500 mg IV every 4 weeks.  First dose 02/19/2020. Status post 3 cycles.  INTERVAL HISTORY: COMPTON BRIGANCE 69 y.o. male returns to the clinic today for follow-up visit.  The patient is feeling fine today with no concerning complaints.  He denied having any current chest pain, shortness of breath, cough or hemoptysis.  He denied having any fever or chills.  He has no nausea, vomiting, diarrhea or constipation.  He denied having any headache or visual changes.  He has no weight loss or night sweats.  He continues to tolerate his treatment  with Imfinzi fairly well.  He had repeat CT scan of the chest performed recently and is here for evaluation and discussion of his discuss results.   MEDICAL HISTORY: Past Medical History:  Diagnosis Date  . A-fib [facility])    s/p DCCV 12/07/18  . Cancer ([place])   . Depression    situational  . Dysrhythmia   . Hypertension     ALLERGIES:  has No Known Allergies.  MEDICATIONS:  Current Outpatient Medications  Medication Sig Dispense Refill  . ibuprofen (ADVIL) 200 MG tablet Take 400 mg by mouth every 8 (eight) hours as needed (for pain).  (Patient not taking: Reported on 04/22/2020)    . metoprolol tartrate (LOPRESSOR) 50 MG tablet Take 50 mg by mouth 2 (two) times daily with a meal.    . Omeprazole (PRILOSEC PO) Take 20 mg by mouth.  (Patient not taking: Reported on 04/22/2020)    . prochlorperazine (COMPAZINE) 10 MG tablet Take 1 tablet (10 mg total) by mouth every 6 (six) hours as needed. (Patient not taking: Reported on 11/16/2019) 30 tablet 2  . sucralfate (CARAFATE) 1 g tablet Crush and mix in 1 oz water and drink 5 min TID before meals and at HS for radiation induced esophagitis (Patient not taking: Reported on 04/22/2020) 120 tablet 2  . XARELTO 20 MG TABS tablet Take 20 mg by mouth daily in the afternoon.      Current Facility-Administered Medications  Medication Dose Route Frequency Provider Last Rate Last Admin  . 0.9 %  sodium chloride infusion  500 mL Intravenous Continuous Thornton Park, MD      . 0.9 %  sodium chloride infusion  500 mL  Intravenous Once Thornton Park, MD        SURGICAL HISTORY:  Past Surgical History:  Procedure Laterality Date  . CARDIOVERSION N/A 12/07/2018   Procedure: CARDIOVERSION;  Surgeon: Josue Hector, MD;  Location: [facility] ENDOSCOPY;  Service: Cardiovascular;  Laterality: N/A;  . FINE NEEDLE ASPIRATION  10/30/2019   Procedure: FINE NEEDLE ASPIRATION (FNA) LINEAR;  Surgeon: Candee Furbish, MD;  Location: [facility] ENDOSCOPY;  Service: Pulmonary;;  .  INGUINAL HERNIA REPAIR Right 01/25/2019   Procedure: RIGHT INGUINAL HERNIA REPAIR WITH MESH;  Surgeon: Coralie Keens, MD;  Location: [facility];  Service: General;  Laterality: Right;  TAP BLOCK  . INSERTION OF MESH Right 01/25/2019   Procedure: Insertion Of Mesh;  Surgeon: Coralie Keens, MD;  Location: [facility];  Service: General;  Laterality: Right;  Marland Kitchen VIDEO BRONCHOSCOPY WITH ENDOBRONCHIAL ULTRASOUND N/A 10/30/2019   Procedure: VIDEO BRONCHOSCOPY WITH ENDOBRONCHIAL ULTRASOUND;  Surgeon: Candee Furbish, MD;  Location: [facility] ENDOSCOPY;  Service: Pulmonary;  Laterality: N/A;    REVIEW OF SYSTEMS:  Constitutional: negative Eyes: negative Ears, nose, mouth, throat, and face: negative Respiratory: negative Cardiovascular: negative Gastrointestinal: negative Genitourinary:negative Integument/breast: negative Hematologic/lymphatic: negative Musculoskeletal:negative Neurological: negative Behavioral/Psych: negative Endocrine: negative Allergic/Immunologic: negative   PHYSICAL EXAMINATION: General appearance: alert, cooperative and no distress Head: Normocephalic, without obvious abnormality, atraumatic Neck: no adenopathy, no JVD, supple, symmetrical, trachea midline and thyroid not enlarged, symmetric, no tenderness/mass/nodules Lymph nodes: Cervical, supraclavicular, and axillary nodes normal. Resp: clear to auscultation bilaterally Back: symmetric, no curvature. ROM normal. No CVA tenderness. Cardio: regular rate and rhythm, S1, S2 normal, no murmur, click, rub or gallop GI: soft, non-tender; bowel sounds normal; no masses,  no organomegaly Extremities: extremities normal, atraumatic, no cyanosis or edema Neurologic: Alert and oriented X 3, normal strength and tone. Normal symmetric reflexes. Normal coordination and gait  ECOG PERFORMANCE STATUS: 1 - Symptomatic but completely ambulatory  Blood pressure 140/77, pulse 62, temperature 98.1 F (36.7 C), temperature source Tympanic, resp.  rate 18, height $RemoveBe'6\' 1"'wHIirWZWt$  (1.854 m), weight 162 lb 1.6 oz (73.5 kg), SpO2 100 %.  LABORATORY DATA: Lab Results  Component Value Date   WBC 4.5 04/22/2020   HGB 13.8 04/22/2020   HCT 39.1 04/22/2020   MCV 95.6 04/22/2020   PLT 173 04/22/2020      Chemistry      Component Value Date/Time   NA 131 (L) 04/22/2020 1138   NA 129 (L) 11/24/2018 1520   K 4.5 04/22/2020 1138   CL 100 04/22/2020 1138   CO2 23 04/22/2020 1138   BUN 6 (L) 04/22/2020 1138   BUN 8 11/24/2018 1520   CREATININE 0.70 04/22/2020 1138      Component Value Date/Time   CALCIUM 9.0 04/22/2020 1138   ALKPHOS 42 04/22/2020 1138   AST 21 04/22/2020 1138   ALT <6 04/22/2020 1138   BILITOT 0.5 04/22/2020 1138       RADIOGRAPHIC STUDIES: CT Chest W Contrast  Addendum Date: 05/19/2020   ADDENDUM REPORT: 05/19/2020 15:11 ADDENDUM: These results will be called to the ordering clinician or representative by the Radiologist Assistant, and communication documented in the PACS or [employer]. Electronically Signed   By: Zetta Bills M.D.   On: 05/19/2020 15:11   Result Date: 05/19/2020 CLINICAL DATA:  Non-small cell cancer staging evaluation in this 69 year old male, currently undergoing chemo and radiotherapy by report. EXAM: CT CHEST WITH CONTRAST TECHNIQUE: Multidetector CT imaging of the chest was performed during intravenous contrast administration. CONTRAST:  42mL  OMNIPAQUE IOHEXOL 300 MG/ML  SOLN COMPARISON:  Comparison is made with August of 2021. FINDINGS: Cardiovascular: Calcified atheromatous plaque and noncalcified plaque in the thoracic aorta. Maximal caliber approximately 3.8 cm of the ascending thoracic aorta. Central pulmonary vasculature unremarkable on venous phase assessment. Heart size normal without pericardial effusion. Mediastinum/Nodes: Mild circumferential distal esophageal mid esophageal thickening in the setting of ongoing radiation, may represent mild esophagitis. No thoracic inlet adenopathy no  axillary adenopathy no discrete nodal enlargement in the chest. More confluent soft tissue in the RIGHT hilum, see below. This represents a change compared to previous imaging. Lungs/Pleura: Volume loss in the RIGHT chest more pronounced than on the previous study in the RIGHT lower and RIGHT upper lobe. Area of bronchiectatic changes in the RIGHT upper lobe seen on the previous study along the major fissure is obscured by developing consolidative changes septal thickening, with respect imaged portions this measures approximately 3.7 cm greatest axial dimension as compared to 4.9 cm. More confluent volume loss in the superior segment of the RIGHT lower lobe. Mild to moderate centrilobular pulmonary emphysema with similar appearance to the prior study. Scattered small areas of ill-defined nodularity in the chest is similar to the prior study. For instance, a 4 mm nodule at the LEFT lung base with other small scatter predominantly ground-glass nodules LEFT chest Interval development of RIGHT-sided pleural fluid in the setting of volume loss and post treatment change. Upper Abdomen: New area of low attenuation in the anteromedial LEFT hepatic lobe just above the inter lobar fissure on image 160 of series 2. Liver is incompletely imaged. Imaged portions of gallbladder, pancreas, spleen, adrenal glands and kidneys are unremarkable. Musculoskeletal: No acute musculoskeletal process. No destructive bone finding. IMPRESSION: 1. Areas of bronchiectatic change in consolidation developing about the RIGHT hilum extending into the superior segment of the RIGHT lower lobe and RIGHT upper lobe with decreased masslike appearance in the RIGHT upper lobe compatible with response to therapy and evolving post radiation changes. 2. New RIGHT-sided pleural effusion without nodular features, attention on follow-up. This may be a response to volume loss in the RIGHT chest. 3. Scattered small nodules in the LEFT chest are unchanged. 4.  Subtle area of hypodensity in the anterior medial LEFT hepatic lobe. This may represent focal fatty infiltration though is nonspecific. Consider hepatic MRI for further assessment. 5. Mild esophageal thickening in the setting of ongoing radiotherapy in the chest. This may represent esophagitis and is nonfocal. 6. Emphysema and aortic atherosclerosis. Aortic Atherosclerosis (ICD10-I70.0) and Emphysema (ICD10-J43.9). Electronically Signed: By: Zetta Bills M.D. On: 05/19/2020 15:07    ASSESSMENT AND PLAN: This is a very pleasant 69 years old white male with a stage IIIA/B (T3, N1/N2, M0) non-small cell lung cancer, adenocarcinoma presented with right upper lobe lung mass with direct extension across the major fissure into the superior segment of the right lower lobe and ipsilateral hilar lymphadenopathy as well as questionable subcarinal lymphadenopathy. The patient completed a course of concurrent chemoradiation with weekly carboplatin and paclitaxel status post 7 cycles.  He tolerated this treatment well with no concerning adverse effect except for mild sore throat and mouth. The patient had partial response to this treatment. The patient is currently undergoing a course of consolidation treatment with immunotherapy with Imfinzi 1500 mg IV every 4 weeks status post 3 cycles.  He has been tolerating his treatment well with no concerning adverse effects. He had repeat CT scan of the chest performed recently.  I personally and independently reviewed the  scan images and discussed the results with the patient today. Has a scan showed no concerning findings for disease progression except for the evolving radiation changes in the right hilum and consolidation but there was decrease in the masslike appearance of the right upper lobe consistent with response.  The patient has new right-sided pleural effusion without nodular features and a supple area of hypodensity in the anterior medial left hepatic lobe  suspicious for focal fatty infiltration. I discussed the results with the patient and gave him the option of proceeding with MRI of the liver for further evaluation of this lesion versus monitoring on the upcoming scan in 3 months. The patient would like to hold on the MRI for now. He will continue his current treatment with Imfinzi with cycle #4 today. The patient will come back for follow-up visit in 4 weeks for evaluation before the next cycle of his treatment. He was advised to call immediately if he has any concerning symptoms in the interval. The patient voices understanding of current disease status and treatment options and is in agreement with the current care plan.  All questions were answered. The patient knows to call the clinic with any problems, questions or concerns. We can certainly see the patient much sooner if necessary.  Disclaimer: This note was dictated with voice recognition software. Similar sounding words can inadvertently be transcribed and may not be corrected upon review.

## 2020-05-20 NOTE — Patient Instructions (Signed)
[facility] Discharge Instructions for Patients Receiving Chemotherapy  Today you received the following chemotherapy agents: durvalumab.  To help prevent nausea and vomiting after your treatment, we encourage you to take your nausea medication as directed.   If you develop nausea and vomiting that is not controlled by your nausea medication, call the clinic.   BELOW ARE SYMPTOMS THAT SHOULD BE REPORTED IMMEDIATELY:  *FEVER GREATER THAN 100.5 F  *CHILLS WITH OR WITHOUT FEVER  NAUSEA AND VOMITING THAT IS NOT CONTROLLED WITH YOUR NAUSEA MEDICATION  *UNUSUAL SHORTNESS OF BREATH  *UNUSUAL BRUISING OR BLEEDING  TENDERNESS IN MOUTH AND THROAT WITH OR WITHOUT PRESENCE OF ULCERS  *URINARY PROBLEMS  *BOWEL PROBLEMS  UNUSUAL RASH Items with * indicate a potential emergency and should be followed up as soon as possible.  Feel free to call the clinic should you have any questions or concerns. The clinic phone number is [phone].  Please show the CHEMO ALERT CARD at check-in to the Emergency Department and triage nurse.   

## 2020-06-17 ENCOUNTER — Inpatient Hospital Stay: Payer: Medicare Other

## 2020-06-17 ENCOUNTER — Inpatient Hospital Stay: Payer: Medicare Other | Attending: Physician Assistant | Admitting: Internal Medicine

## 2020-06-17 ENCOUNTER — Encounter: Payer: Self-pay | Admitting: Internal Medicine

## 2020-06-17 ENCOUNTER — Other Ambulatory Visit: Payer: Self-pay

## 2020-06-17 VITALS — BP 133/72 | HR 60 | Temp 97.6°F | Resp 15 | Ht 73.0 in | Wt 160.1 lb

## 2020-06-17 DIAGNOSIS — Z923 Personal history of irradiation: Secondary | ICD-10-CM | POA: Insufficient documentation

## 2020-06-17 DIAGNOSIS — I10 Essential (primary) hypertension: Secondary | ICD-10-CM | POA: Diagnosis not present

## 2020-06-17 DIAGNOSIS — Z7901 Long term (current) use of anticoagulants: Secondary | ICD-10-CM | POA: Insufficient documentation

## 2020-06-17 DIAGNOSIS — C3431 Malignant neoplasm of lower lobe, right bronchus or lung: Secondary | ICD-10-CM | POA: Insufficient documentation

## 2020-06-17 DIAGNOSIS — Z5112 Encounter for antineoplastic immunotherapy: Secondary | ICD-10-CM | POA: Insufficient documentation

## 2020-06-17 DIAGNOSIS — C3491 Malignant neoplasm of unspecified part of right bronchus or lung: Secondary | ICD-10-CM

## 2020-06-17 DIAGNOSIS — Z79899 Other long term (current) drug therapy: Secondary | ICD-10-CM | POA: Diagnosis not present

## 2020-06-17 DIAGNOSIS — C3411 Malignant neoplasm of upper lobe, right bronchus or lung: Secondary | ICD-10-CM | POA: Insufficient documentation

## 2020-06-17 DIAGNOSIS — I4891 Unspecified atrial fibrillation: Secondary | ICD-10-CM | POA: Diagnosis not present

## 2020-06-17 DIAGNOSIS — Z9221 Personal history of antineoplastic chemotherapy: Secondary | ICD-10-CM | POA: Insufficient documentation

## 2020-06-17 DIAGNOSIS — J439 Emphysema, unspecified: Secondary | ICD-10-CM | POA: Insufficient documentation

## 2020-06-17 LAB — CMP (CANCER CENTER ONLY)
ALT: 11 U/L (ref 0–44)
AST: 26 U/L (ref 15–41)
Albumin: 3.8 g/dL (ref 3.5–5.0)
Alkaline Phosphatase: 43 U/L (ref 38–126)
Anion gap: 10 (ref 5–15)
BUN: 6 mg/dL — ABNORMAL LOW (ref 8–23)
CO2: 21 mmol/L — ABNORMAL LOW (ref 22–32)
Calcium: 9.4 mg/dL (ref 8.9–10.3)
Chloride: 99 mmol/L (ref 98–111)
Creatinine: 0.73 mg/dL (ref 0.61–1.24)
GFR, Estimated: >60 mL/min (ref >60)
Glucose, Bld: 100 mg/dL — ABNORMAL HIGH (ref 70–99)
Potassium: 4.6 mmol/L (ref 3.5–5.1)
Sodium: 130 mmol/L — ABNORMAL LOW (ref 135–145)
Total Bilirubin: 0.8 mg/dL (ref 0.3–1.2)
Total Protein: 7.5 g/dL (ref 6.5–8.1)

## 2020-06-17 LAB — CBC WITH DIFFERENTIAL (CANCER CENTER ONLY)
Abs Immature Granulocytes: 0.01 10*3/uL (ref 0.00–0.07)
Basophils Absolute: 0.1 10*3/uL (ref 0.0–0.1)
Basophils Relative: 1 %
Eosinophils Absolute: 0 10*3/uL (ref 0.0–0.5)
Eosinophils Relative: 1 %
HCT: 40.3 % (ref 39.0–52.0)
Hemoglobin: 14.6 g/dL (ref 13.0–17.0)
Immature Granulocytes: 0 %
Lymphocytes Relative: 11 %
Lymphs Abs: 0.5 10*3/uL — ABNORMAL LOW (ref 0.7–4.0)
MCH: 33.9 pg (ref 26.0–34.0)
MCHC: 36.2 g/dL — ABNORMAL HIGH (ref 30.0–36.0)
MCV: 93.5 fL (ref 80.0–100.0)
Monocytes Absolute: 0.6 10*3/uL (ref 0.1–1.0)
Monocytes Relative: 13 %
Neutro Abs: 3.3 10*3/uL (ref 1.7–7.7)
Neutrophils Relative %: 74 %
Platelet Count: 184 10*3/uL (ref 150–400)
RBC: 4.31 MIL/uL (ref 4.22–5.81)
RDW: 13.3 % (ref 11.5–15.5)
WBC Count: 4.4 10*3/uL (ref 4.0–10.5)
nRBC: 0 % (ref 0.0–0.2)

## 2020-06-17 LAB — TSH: TSH: 1.125 u[IU]/mL (ref 0.320–4.118)

## 2020-06-17 MED ORDER — SODIUM CHLORIDE 0.9 % IV SOLN
Freq: Once | INTRAVENOUS | Status: AC
  Filled 2020-06-17: qty 250

## 2020-06-17 MED ORDER — SODIUM CHLORIDE 0.9 % IV SOLN
1500.0000 mg | Freq: Once | INTRAVENOUS | Status: AC
  Administered 2020-06-17: 1500 mg via INTRAVENOUS
  Filled 2020-06-17: qty 30

## 2020-06-17 NOTE — Patient Instructions (Signed)
[facility] Discharge Instructions for Patients Receiving Chemotherapy  Today you received the following immunotherapy agent: Durvalumab (Imfinzi)  To help prevent nausea and vomiting after your treatment, we encourage you to take your nausea medication as directed by your MD.   If you develop nausea and vomiting that is not controlled by your nausea medication, call the clinic.   BELOW ARE SYMPTOMS THAT SHOULD BE REPORTED IMMEDIATELY:  *FEVER GREATER THAN 100.5 F  *CHILLS WITH OR WITHOUT FEVER  NAUSEA AND VOMITING THAT IS NOT CONTROLLED WITH YOUR NAUSEA MEDICATION  *UNUSUAL SHORTNESS OF BREATH  *UNUSUAL BRUISING OR BLEEDING  TENDERNESS IN MOUTH AND THROAT WITH OR WITHOUT PRESENCE OF ULCERS  *URINARY PROBLEMS  *BOWEL PROBLEMS  UNUSUAL RASH Items with * indicate a potential emergency and should be followed up as soon as possible.  Feel free to call the clinic should you have any questions or concerns. The clinic phone number is [phone].  Please show the [facility] at check-in to the Emergency Department and triage nurse.

## 2020-06-17 NOTE — Progress Notes (Signed)
[facility] [phone]   [phone]  OFFICE PROGRESS NOTE  Lujean Amel, MD [address] [place] [address]  DIAGNOSIS: stage IIIA/B (T3, N1/N2, M0)non-small cell lung cancer, adenocarcinoma. He presented with a right upper lobe lung mass with direct extension across the major fissure into the superior segment of the right lower lobe and ipsilateral hilar lymphadenopathy. There is questionable subcarinal lymphadenopathy versus esophageal hypermetabolism this was evaluated by gastroenterology with upper endoscopy on 01/30/2020 and there was no mass lesion or malignancy in that area. The patient was diagnosed in May 2021.  Biomarker Findings Microsatellite status - MS-Stable Tumor Mutational Burden - 9 Muts/Mb Genomic Findings For a complete list of the genes assayed, please refer to the Appendix. KRAS G12C CDKN2A/B p16INK4a S12* NKX2-1 amplification - equivocal? PMS2 R3Q TP53 P71fs*38 7 Disease relevant genes with no reportable alterations: ALK, BRAF, EGFR, ERBB2, MET, RET, ROS1  PDL1: 70%  PRIOR THERAPY: Weekly concurrent chemoradiation with carboplatin for an AUC of 2 and paclitaxel 45 mg/m. First dose expected on 11/26/2019. Status post 7 cycles.   Last dose was giving 01/07/2020 with partial response.   CURRENT THERAPY:  Consolidation treatment with immunotherapy with Imfinzi 1500 mg IV every 4 weeks.  First dose 02/19/2020. Status post 4 cycles.  INTERVAL HISTORY: Christopher Harding 70 y.o. male returns to the clinic today for follow-up visit.  The patient is feeling fine today with no concerning complaints.  He has intermittent pain in the right lower rib cage with no significant shortness of breath, cough or hemoptysis.  He denied having any fever or chills.  He has no nausea, vomiting, diarrhea or constipation.  He denied having any headache or visual changes.  He has no weight loss or night sweats.  He  continues to tolerate his treatment with Imfinzi fairly well.  The patient is here today for evaluation before starting cycle #5 of his treatment  MEDICAL HISTORY: Past Medical History:  Diagnosis Date  . A-fib [facility])    s/p DCCV 12/07/18  . Cancer ([place])   . Depression    situational  . Dysrhythmia   . Hypertension     ALLERGIES:  has No Known Allergies.  MEDICATIONS:  Current Outpatient Medications  Medication Sig Dispense Refill  . metoprolol tartrate (LOPRESSOR) 50 MG tablet Take 50 mg by mouth 2 (two) times daily with a meal.    . Omeprazole (PRILOSEC PO) Take 20 mg by mouth.    . sucralfate (CARAFATE) 1 g tablet Crush and mix in 1 oz water and drink 5 min TID before meals and at HS for radiation induced esophagitis 120 tablet 2  . XARELTO 20 MG TABS tablet Take 20 mg by mouth daily in the afternoon.     Marland Kitchen ibuprofen (ADVIL) 200 MG tablet Take 400 mg by mouth every 8 (eight) hours as needed (for pain).  (Patient not taking: No sig reported)    . prochlorperazine (COMPAZINE) 10 MG tablet Take 1 tablet (10 mg total) by mouth every 6 (six) hours as needed. (Patient not taking: No sig reported) 30 tablet 2   Current Facility-Administered Medications  Medication Dose Route Frequency Provider Last Rate Last Admin  . 0.9 %  sodium chloride infusion  500 mL Intravenous Continuous Thornton Park, MD      . 0.9 %  sodium chloride infusion  500 mL Intravenous Once Thornton Park, MD        SURGICAL HISTORY:  Past Surgical History:  Procedure Laterality Date  . CARDIOVERSION N/A 12/07/2018   Procedure: CARDIOVERSION;  Surgeon: Josue Hector, MD;  Location: [facility] ENDOSCOPY;  Service: Cardiovascular;  Laterality: N/A;  . FINE NEEDLE ASPIRATION  10/30/2019   Procedure: FINE NEEDLE ASPIRATION (FNA) LINEAR;  Surgeon: Candee Furbish, MD;  Location: [facility] ENDOSCOPY;  Service: Pulmonary;;  . INGUINAL HERNIA REPAIR Right 01/25/2019   Procedure: RIGHT INGUINAL HERNIA REPAIR WITH MESH;  Surgeon:  Coralie Keens, MD;  Location: [facility];  Service: General;  Laterality: Right;  TAP BLOCK  . INSERTION OF MESH Right 01/25/2019   Procedure: Insertion Of Mesh;  Surgeon: Coralie Keens, MD;  Location: [facility];  Service: General;  Laterality: Right;  Marland Kitchen VIDEO BRONCHOSCOPY WITH ENDOBRONCHIAL ULTRASOUND N/A 10/30/2019   Procedure: VIDEO BRONCHOSCOPY WITH ENDOBRONCHIAL ULTRASOUND;  Surgeon: Candee Furbish, MD;  Location: [facility] ENDOSCOPY;  Service: Pulmonary;  Laterality: N/A;    REVIEW OF SYSTEMS:  A comprehensive review of systems was negative.   PHYSICAL EXAMINATION: General appearance: alert, cooperative and no distress Head: Normocephalic, without obvious abnormality, atraumatic Neck: no adenopathy, no JVD, supple, symmetrical, trachea midline and thyroid not enlarged, symmetric, no tenderness/mass/nodules Lymph nodes: Cervical, supraclavicular, and axillary nodes normal. Resp: clear to auscultation bilaterally Back: symmetric, no curvature. ROM normal. No CVA tenderness. Cardio: regular rate and rhythm, S1, S2 normal, no murmur, click, rub or gallop GI: soft, non-tender; bowel sounds normal; no masses,  no organomegaly Extremities: extremities normal, atraumatic, no cyanosis or edema  ECOG PERFORMANCE STATUS: 1 - Symptomatic but completely ambulatory  Blood pressure 133/72, pulse 60, temperature 97.6 F (36.4 C), temperature source Tympanic, resp. rate 15, height $RemoveBe'6\' 1"'yIcvxABoP$  (1.854 m), weight 160 lb 1.6 oz (72.6 kg), SpO2 99 %.  LABORATORY DATA: Lab Results  Component Value Date   WBC 4.4 06/17/2020   HGB 14.6 06/17/2020   HCT 40.3 06/17/2020   MCV 93.5 06/17/2020   PLT 184 06/17/2020      Chemistry      Component Value Date/Time   NA 130 (L) 06/17/2020 1116   NA 129 (L) 11/24/2018 1520   K 4.6 06/17/2020 1116   CL 99 06/17/2020 1116   CO2 21 (L) 06/17/2020 1116   BUN 6 (L) 06/17/2020 1116   BUN 8 11/24/2018 1520   CREATININE 0.73 06/17/2020 1116      Component Value Date/Time    CALCIUM 9.4 06/17/2020 1116   ALKPHOS 43 06/17/2020 1116   AST 26 06/17/2020 1116   ALT 11 06/17/2020 1116   BILITOT 0.8 06/17/2020 1116       RADIOGRAPHIC STUDIES: CT Chest W Contrast  Addendum Date: 05/19/2020   ADDENDUM REPORT: 05/19/2020 15:11 ADDENDUM: These results will be called to the ordering clinician or representative by the Radiologist Assistant, and communication documented in the PACS or [employer]. Electronically Signed   By: Zetta Bills M.D.   On: 05/19/2020 15:11   Result Date: 05/19/2020 CLINICAL DATA:  Non-small cell cancer staging evaluation in this 70 year old male, currently undergoing chemo and radiotherapy by report. EXAM: CT CHEST WITH CONTRAST TECHNIQUE: Multidetector CT imaging of the chest was performed during intravenous contrast administration. CONTRAST:  52mL OMNIPAQUE IOHEXOL 300 MG/ML  SOLN COMPARISON:  Comparison is made with August of 2021. FINDINGS: Cardiovascular: Calcified atheromatous plaque and noncalcified plaque in the thoracic aorta. Maximal caliber approximately 3.8 cm of the ascending thoracic aorta. Central pulmonary vasculature unremarkable on venous phase assessment. Heart size normal without pericardial effusion. Mediastinum/Nodes: Mild circumferential distal  esophageal mid esophageal thickening in the setting of ongoing radiation, may represent mild esophagitis. No thoracic inlet adenopathy no axillary adenopathy no discrete nodal enlargement in the chest. More confluent soft tissue in the RIGHT hilum, see below. This represents a change compared to previous imaging. Lungs/Pleura: Volume loss in the RIGHT chest more pronounced than on the previous study in the RIGHT lower and RIGHT upper lobe. Area of bronchiectatic changes in the RIGHT upper lobe seen on the previous study along the major fissure is obscured by developing consolidative changes septal thickening, with respect imaged portions this measures approximately 3.7 cm greatest  axial dimension as compared to 4.9 cm. More confluent volume loss in the superior segment of the RIGHT lower lobe. Mild to moderate centrilobular pulmonary emphysema with similar appearance to the prior study. Scattered small areas of ill-defined nodularity in the chest is similar to the prior study. For instance, a 4 mm nodule at the LEFT lung base with other small scatter predominantly ground-glass nodules LEFT chest Interval development of RIGHT-sided pleural fluid in the setting of volume loss and post treatment change. Upper Abdomen: New area of low attenuation in the anteromedial LEFT hepatic lobe just above the inter lobar fissure on image 160 of series 2. Liver is incompletely imaged. Imaged portions of gallbladder, pancreas, spleen, adrenal glands and kidneys are unremarkable. Musculoskeletal: No acute musculoskeletal process. No destructive bone finding. IMPRESSION: 1. Areas of bronchiectatic change in consolidation developing about the RIGHT hilum extending into the superior segment of the RIGHT lower lobe and RIGHT upper lobe with decreased masslike appearance in the RIGHT upper lobe compatible with response to therapy and evolving post radiation changes. 2. New RIGHT-sided pleural effusion without nodular features, attention on follow-up. This may be a response to volume loss in the RIGHT chest. 3. Scattered small nodules in the LEFT chest are unchanged. 4. Subtle area of hypodensity in the anterior medial LEFT hepatic lobe. This may represent focal fatty infiltration though is nonspecific. Consider hepatic MRI for further assessment. 5. Mild esophageal thickening in the setting of ongoing radiotherapy in the chest. This may represent esophagitis and is nonfocal. 6. Emphysema and aortic atherosclerosis. Aortic Atherosclerosis (ICD10-I70.0) and Emphysema (ICD10-J43.9). Electronically Signed: By: Zetta Bills M.D. On: 05/19/2020 15:07    ASSESSMENT AND PLAN: This is a very pleasant 70 years old white  male with a stage IIIA/B (T3, N1/N2, M0) non-small cell lung cancer, adenocarcinoma presented with right upper lobe lung mass with direct extension across the major fissure into the superior segment of the right lower lobe and ipsilateral hilar lymphadenopathy as well as questionable subcarinal lymphadenopathy. The patient completed a course of concurrent chemoradiation with weekly carboplatin and paclitaxel status post 7 cycles.  He tolerated this treatment well with no concerning adverse effect except for mild sore throat and mouth. The patient had partial response to this treatment. The patient is currently undergoing a course of consolidation treatment with immunotherapy with Imfinzi 1500 mg IV every 4 weeks status post 4 cycles.  He tolerated the last cycle of his treatment well with no concerning adverse effects. I recommended for him to proceed with cycle #5 today as planned. I will see him back for follow-up visit in 4 weeks for evaluation before starting cycle #6. The patient was advised to call immediately if he has any concerning symptoms in the interval. The patient voices understanding of current disease status and treatment options and is in agreement with the current care plan.  All questions were answered.  The patient knows to call the clinic with any problems, questions or concerns. We can certainly see the patient much sooner if necessary.  Disclaimer: This note was dictated with voice recognition software. Similar sounding words can inadvertently be transcribed and may not be corrected upon review.

## 2020-06-26 ENCOUNTER — Telehealth: Payer: Self-pay | Admitting: Internal Medicine

## 2020-06-26 NOTE — Telephone Encounter (Signed)
Scheduled per 1/11 los. Called pt and left a msg

## 2020-07-15 ENCOUNTER — Inpatient Hospital Stay (HOSPITAL_BASED_OUTPATIENT_CLINIC_OR_DEPARTMENT_OTHER): Payer: Medicare Other | Admitting: Internal Medicine

## 2020-07-15 ENCOUNTER — Inpatient Hospital Stay: Payer: Medicare Other | Attending: Physician Assistant

## 2020-07-15 ENCOUNTER — Inpatient Hospital Stay: Payer: Medicare Other

## 2020-07-15 ENCOUNTER — Other Ambulatory Visit: Payer: Self-pay

## 2020-07-15 VITALS — BP 138/68 | HR 61 | Temp 97.8°F | Resp 13 | Ht 73.0 in | Wt 160.3 lb

## 2020-07-15 DIAGNOSIS — C3411 Malignant neoplasm of upper lobe, right bronchus or lung: Secondary | ICD-10-CM | POA: Diagnosis not present

## 2020-07-15 DIAGNOSIS — C3491 Malignant neoplasm of unspecified part of right bronchus or lung: Secondary | ICD-10-CM | POA: Diagnosis not present

## 2020-07-15 DIAGNOSIS — C3490 Malignant neoplasm of unspecified part of unspecified bronchus or lung: Secondary | ICD-10-CM | POA: Diagnosis not present

## 2020-07-15 DIAGNOSIS — Z79899 Other long term (current) drug therapy: Secondary | ICD-10-CM | POA: Diagnosis not present

## 2020-07-15 DIAGNOSIS — I10 Essential (primary) hypertension: Secondary | ICD-10-CM | POA: Insufficient documentation

## 2020-07-15 DIAGNOSIS — I4891 Unspecified atrial fibrillation: Secondary | ICD-10-CM | POA: Diagnosis not present

## 2020-07-15 DIAGNOSIS — Z5112 Encounter for antineoplastic immunotherapy: Secondary | ICD-10-CM

## 2020-07-15 DIAGNOSIS — Z7901 Long term (current) use of anticoagulants: Secondary | ICD-10-CM | POA: Diagnosis not present

## 2020-07-15 DIAGNOSIS — Z923 Personal history of irradiation: Secondary | ICD-10-CM | POA: Diagnosis not present

## 2020-07-15 LAB — CBC WITH DIFFERENTIAL (CANCER CENTER ONLY)
Abs Immature Granulocytes: 0.02 10*3/uL (ref 0.00–0.07)
Basophils Absolute: 0.1 10*3/uL (ref 0.0–0.1)
Basophils Relative: 1 %
Eosinophils Absolute: 0.1 10*3/uL (ref 0.0–0.5)
Eosinophils Relative: 2 %
HCT: 39.7 % (ref 39.0–52.0)
Hemoglobin: 14.1 g/dL (ref 13.0–17.0)
Immature Granulocytes: 1 %
Lymphocytes Relative: 16 %
Lymphs Abs: 0.7 10*3/uL (ref 0.7–4.0)
MCH: 33.8 pg (ref 26.0–34.0)
MCHC: 35.5 g/dL (ref 30.0–36.0)
MCV: 95.2 fL (ref 80.0–100.0)
Monocytes Absolute: 0.5 10*3/uL (ref 0.1–1.0)
Monocytes Relative: 13 %
Neutro Abs: 2.8 10*3/uL (ref 1.7–7.7)
Neutrophils Relative %: 67 %
Platelet Count: 196 10*3/uL (ref 150–400)
RBC: 4.17 MIL/uL — ABNORMAL LOW (ref 4.22–5.81)
RDW: 13 % (ref 11.5–15.5)
WBC Count: 4.1 10*3/uL (ref 4.0–10.5)
nRBC: 0 % (ref 0.0–0.2)

## 2020-07-15 LAB — CMP (CANCER CENTER ONLY)
ALT: 9 U/L (ref 0–44)
AST: 21 U/L (ref 15–41)
Albumin: 3.8 g/dL (ref 3.5–5.0)
Alkaline Phosphatase: 45 U/L (ref 38–126)
Anion gap: 9 (ref 5–15)
BUN: 5 mg/dL — ABNORMAL LOW (ref 8–23)
CO2: 21 mmol/L — ABNORMAL LOW (ref 22–32)
Calcium: 8.9 mg/dL (ref 8.9–10.3)
Chloride: 100 mmol/L (ref 98–111)
Creatinine: 0.71 mg/dL (ref 0.61–1.24)
GFR, Estimated: >60 mL/min (ref >60)
Glucose, Bld: 77 mg/dL (ref 70–99)
Potassium: 4.4 mmol/L (ref 3.5–5.1)
Sodium: 130 mmol/L — ABNORMAL LOW (ref 135–145)
Total Bilirubin: 0.6 mg/dL (ref 0.3–1.2)
Total Protein: 7.4 g/dL (ref 6.5–8.1)

## 2020-07-15 LAB — TSH: TSH: 1.514 u[IU]/mL (ref 0.320–4.118)

## 2020-07-15 MED ORDER — SODIUM CHLORIDE 0.9 % IV SOLN
1500.0000 mg | Freq: Once | INTRAVENOUS | Status: AC
  Administered 2020-07-15: 1500 mg via INTRAVENOUS
  Filled 2020-07-15: qty 30

## 2020-07-15 MED ORDER — SODIUM CHLORIDE 0.9 % IV SOLN
Freq: Once | INTRAVENOUS | Status: AC
  Filled 2020-07-15: qty 250

## 2020-07-15 NOTE — Patient Instructions (Signed)
[facility] Discharge Instructions for Patients Receiving Chemotherapy  Today you received the following immunotherapy agent: Durvalumab (Imfinzi)  To help prevent nausea and vomiting after your treatment, we encourage you to take your nausea medication as directed by your MD.   If you develop nausea and vomiting that is not controlled by your nausea medication, call the clinic.   BELOW ARE SYMPTOMS THAT SHOULD BE REPORTED IMMEDIATELY:  *FEVER GREATER THAN 100.5 F  *CHILLS WITH OR WITHOUT FEVER  NAUSEA AND VOMITING THAT IS NOT CONTROLLED WITH YOUR NAUSEA MEDICATION  *UNUSUAL SHORTNESS OF BREATH  *UNUSUAL BRUISING OR BLEEDING  TENDERNESS IN MOUTH AND THROAT WITH OR WITHOUT PRESENCE OF ULCERS  *URINARY PROBLEMS  *BOWEL PROBLEMS  UNUSUAL RASH Items with * indicate a potential emergency and should be followed up as soon as possible.  Feel free to call the clinic should you have any questions or concerns. The clinic phone number is [phone].  Please show the [facility] at check-in to the Emergency Department and triage nurse.

## 2020-07-15 NOTE — Progress Notes (Signed)
[facility] Telephone:(336) [phone]   Fax:(336) [phone]  OFFICE PROGRESS NOTE  Christopher Amel, MD [address]  [place] [address]  DIAGNOSIS: stage IIIA/B (T3, N1/N2, M0)non-small cell lung cancer, adenocarcinoma. He presented with a right upper lobe lung mass with direct extension across the major fissure into the superior segment of the right lower lobe and ipsilateral hilar lymphadenopathy. There is questionable subcarinal lymphadenopathy versus esophageal hypermetabolism this was evaluated by gastroenterology with upper endoscopy on 01/30/2020 and there was no mass lesion or malignancy in that area. The patient was diagnosed in May 2021.  Biomarker Findings Microsatellite status - MS-Stable Tumor Mutational Burden - 9 Muts/Mb Genomic Findings For a complete list of the genes assayed, please refer to the Appendix. KRAS G12C CDKN2A/B p16INK4a S12* NKX2-1 amplification - equivocal? PMS2 R3Q TP53 P37fs*38 7 Disease relevant genes with no reportable alterations: ALK, BRAF, EGFR, ERBB2, MET, RET, ROS1  PDL1: 70%  PRIOR THERAPY: Weekly concurrent chemoradiation with carboplatin for an AUC of 2 and paclitaxel 45 mg/m. First dose expected on 11/26/2019. Status post 7 cycles.   Last dose was giving 01/07/2020 with partial response.   CURRENT THERAPY:  Consolidation treatment with immunotherapy with Imfinzi 1500 mg IV every 4 weeks.  First dose 02/19/2020. Status post 5  cycles.  INTERVAL HISTORY: Christopher Harding 70 y.o. male returns to the clinic today for follow-up visit. The patient is feeling fine today with no concerning complaints. The pain on the right side of the chest resolved. He denied having any shortness of breath, cough or hemoptysis. He denied having any fever or chills. He has no nausea, vomiting, diarrhea or constipation. He has no headache or visual changes. He continues to tolerate his treatment with consolidation Imfinzi  fairly well. The patient is here today for evaluation before starting cycle #6.  MEDICAL HISTORY: Past Medical History:  Diagnosis Date  . A-fib [facility])    s/p DCCV 12/07/18  . Cancer ([place])   . Depression    situational  . Dysrhythmia   . Hypertension     ALLERGIES:  has No Known Allergies.  MEDICATIONS:  Current Outpatient Medications  Medication Sig Dispense Refill  . metoprolol tartrate (LOPRESSOR) 50 MG tablet Take 50 mg by mouth 2 (two) times daily with a meal.    . XARELTO 20 MG TABS tablet Take 20 mg by mouth daily in the afternoon.     Marland Kitchen ibuprofen (ADVIL) 200 MG tablet Take 400 mg by mouth every 8 (eight) hours as needed (for pain).  (Patient not taking: No sig reported)    . Omeprazole (PRILOSEC PO) Take 20 mg by mouth. (Patient not taking: Reported on 07/15/2020)    . prochlorperazine (COMPAZINE) 10 MG tablet Take 1 tablet (10 mg total) by mouth every 6 (six) hours as needed. (Patient not taking: No sig reported) 30 tablet 2  . sucralfate (CARAFATE) 1 g tablet Crush and mix in 1 oz water and drink 5 min TID before meals and at HS for radiation induced esophagitis (Patient not taking: Reported on 07/15/2020) 120 tablet 2   Current Facility-Administered Medications  Medication Dose Route Frequency Provider Last Rate Last Admin  . 0.9 %  sodium chloride infusion  500 mL Intravenous Continuous Thornton Park, MD      . 0.9 %  sodium chloride infusion  500 mL Intravenous Once Thornton Park, MD        SURGICAL HISTORY:  Past Surgical History:  Procedure  Laterality Date  . CARDIOVERSION N/A 12/07/2018   Procedure: CARDIOVERSION;  Surgeon: Josue Hector, MD;  Location: [facility] ENDOSCOPY;  Service: Cardiovascular;  Laterality: N/A;  . FINE NEEDLE ASPIRATION  10/30/2019   Procedure: FINE NEEDLE ASPIRATION (FNA) LINEAR;  Surgeon: Candee Furbish, MD;  Location: [facility] ENDOSCOPY;  Service: Pulmonary;;  . INGUINAL HERNIA REPAIR Right 01/25/2019   Procedure: RIGHT INGUINAL HERNIA REPAIR WITH  MESH;  Surgeon: Coralie Keens, MD;  Location: [facility];  Service: General;  Laterality: Right;  TAP BLOCK  . INSERTION OF MESH Right 01/25/2019   Procedure: Insertion Of Mesh;  Surgeon: Coralie Keens, MD;  Location: [facility];  Service: General;  Laterality: Right;  Marland Kitchen VIDEO BRONCHOSCOPY WITH ENDOBRONCHIAL ULTRASOUND N/A 10/30/2019   Procedure: VIDEO BRONCHOSCOPY WITH ENDOBRONCHIAL ULTRASOUND;  Surgeon: Candee Furbish, MD;  Location: [facility] ENDOSCOPY;  Service: Pulmonary;  Laterality: N/A;    REVIEW OF SYSTEMS:  A comprehensive review of systems was negative.   PHYSICAL EXAMINATION: General appearance: alert, cooperative and no distress Head: Normocephalic, without obvious abnormality, atraumatic Neck: no adenopathy, no JVD, supple, symmetrical, trachea midline and thyroid not enlarged, symmetric, no tenderness/mass/nodules Lymph nodes: Cervical, supraclavicular, and axillary nodes normal. Resp: clear to auscultation bilaterally Back: symmetric, no curvature. ROM normal. No CVA tenderness. Cardio: regular rate and rhythm, S1, S2 normal, no murmur, click, rub or gallop GI: soft, non-tender; bowel sounds normal; no masses,  no organomegaly Extremities: extremities normal, atraumatic, no cyanosis or edema  ECOG PERFORMANCE STATUS: 1 - Symptomatic but completely ambulatory  Blood pressure 138/68, pulse 61, temperature 97.8 F (36.6 C), temperature source Tympanic, resp. rate 13, height $RemoveBe'6\' 1"'EbbGvuHTp$  (1.854 m), weight 160 lb 4.8 oz (72.7 kg), SpO2 100 %.  LABORATORY DATA: Lab Results  Component Value Date   WBC 4.1 07/15/2020   HGB 14.1 07/15/2020   HCT 39.7 07/15/2020   MCV 95.2 07/15/2020   PLT 196 07/15/2020      Chemistry      Component Value Date/Time   NA 130 (L) 07/15/2020 0930   NA 129 (L) 11/24/2018 1520   K 4.4 07/15/2020 0930   CL 100 07/15/2020 0930   CO2 21 (L) 07/15/2020 0930   BUN 5 (L) 07/15/2020 0930   BUN 8 11/24/2018 1520   CREATININE 0.71 07/15/2020 0930      Component  Value Date/Time   CALCIUM 8.9 07/15/2020 0930   ALKPHOS 45 07/15/2020 0930   AST 21 07/15/2020 0930   ALT 9 07/15/2020 0930   BILITOT 0.6 07/15/2020 0930       RADIOGRAPHIC STUDIES: No results found.  ASSESSMENT AND PLAN: This is a very pleasant 70 years old white male with a stage IIIA/B (T3, N1/N2, M0) non-small cell lung cancer, adenocarcinoma presented with right upper lobe lung mass with direct extension across the major fissure into the superior segment of the right lower lobe and ipsilateral hilar lymphadenopathy as well as questionable subcarinal lymphadenopathy. The patient completed a course of concurrent chemoradiation with weekly carboplatin and paclitaxel status post 7 cycles.  He tolerated this treatment well with no concerning adverse effect except for mild sore throat and mouth. The patient had partial response to this treatment. The patient is currently undergoing a course of consolidation treatment with immunotherapy with Imfinzi 1500 mg IV every 4 weeks status post 5 cycles.  The patient has been tolerating this treatment well with no concerning adverse effects. I recommended for him to proceed with cycle #6 today as planned. I will see  him back for follow-up visit in 4 weeks for evaluation with repeat CT scan of the chest for restaging of his disease. He was advised to call immediately if he has any concerning symptoms in the interval. The patient voices understanding of current disease status and treatment options and is in agreement with the current care plan.  All questions were answered. The patient knows to call the clinic with any problems, questions or concerns. We can certainly see the patient much sooner if necessary.  Disclaimer: This note was dictated with voice recognition software. Similar sounding words can inadvertently be transcribed and may not be corrected upon review.

## 2020-07-17 ENCOUNTER — Telehealth: Payer: Self-pay | Admitting: Internal Medicine

## 2020-07-17 NOTE — Telephone Encounter (Signed)
Per 2/8 los, next appt have been scheduled

## 2020-08-08 ENCOUNTER — Ambulatory Visit (HOSPITAL_COMMUNITY)
Admission: RE | Admit: 2020-08-08 | Discharge: 2020-08-08 | Disposition: A | Discharge status: Home / Self Care | Payer: Medicare Other | Source: Ambulatory Visit | Attending: Internal Medicine | Admitting: Internal Medicine

## 2020-08-08 ENCOUNTER — Other Ambulatory Visit: Payer: Self-pay

## 2020-08-08 DIAGNOSIS — J438 Other emphysema: Secondary | ICD-10-CM | POA: Diagnosis not present

## 2020-08-08 DIAGNOSIS — I2510 Atherosclerotic heart disease of native coronary artery without angina pectoris: Secondary | ICD-10-CM | POA: Diagnosis not present

## 2020-08-08 DIAGNOSIS — C3490 Malignant neoplasm of unspecified part of unspecified bronchus or lung: Secondary | ICD-10-CM | POA: Diagnosis not present

## 2020-08-08 DIAGNOSIS — I708 Atherosclerosis of other arteries: Secondary | ICD-10-CM | POA: Diagnosis not present

## 2020-08-08 IMAGING — CT CT CHEST W/ CM
2 of 4 series · 15 of 36 positions shown, 18 images · IV contrast (omnipaque)
Comparison: PET-CT [DATE] and chest CTs [DATE] and
[DATE]

CLINICAL DATA: Restaging non-small cell lung cancer. History of
chemotherapy and radiation therapy.

EXAM:
CT CHEST WITH CONTRAST
TECHNIQUE: Multidetector CT imaging of the chest was performed during
intravenous contrast administration.
CONTRAST:  75mL OMNIPAQUE IOHEXOL 300 MG/ML  SOLN

[Series 2: axial st · axial · 0.78mm/px · z∈[-387,-79]mm · 12 of 184 slices shown, 15 images]
[im 15/184  mediastinal]
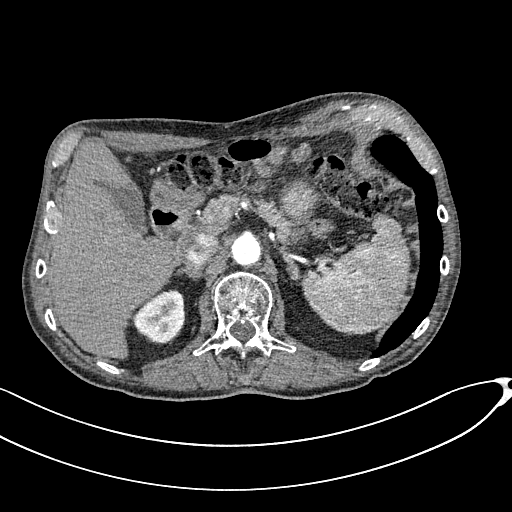
[im 15/184  lung]
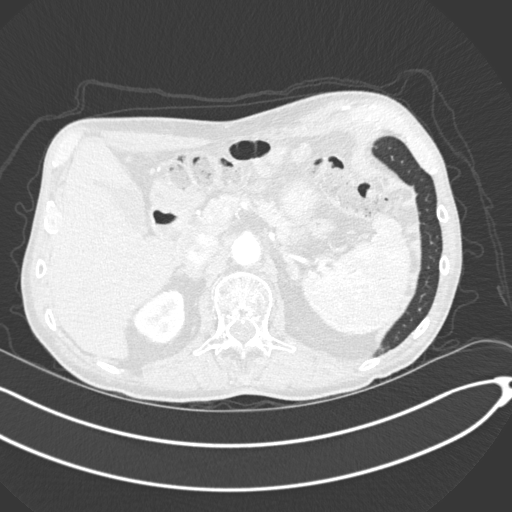
[im 29/184  lung]
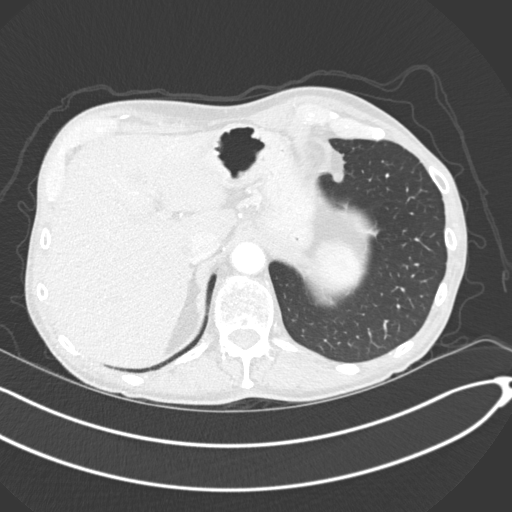
[im 43/184  lung]
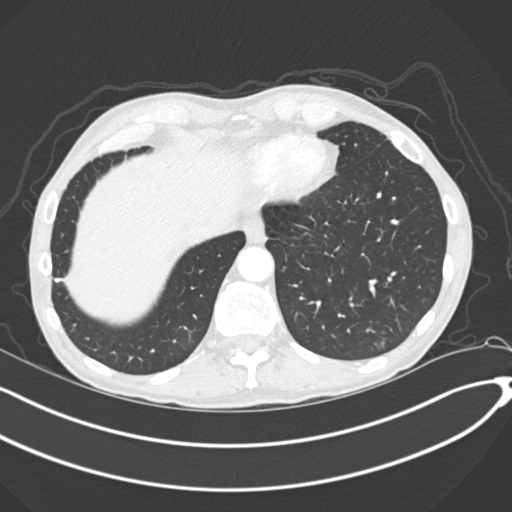
[im 57/184  lung]
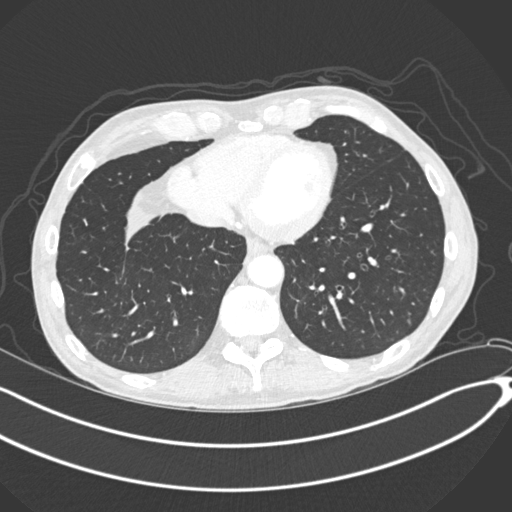
[im 71/184  mediastinal]
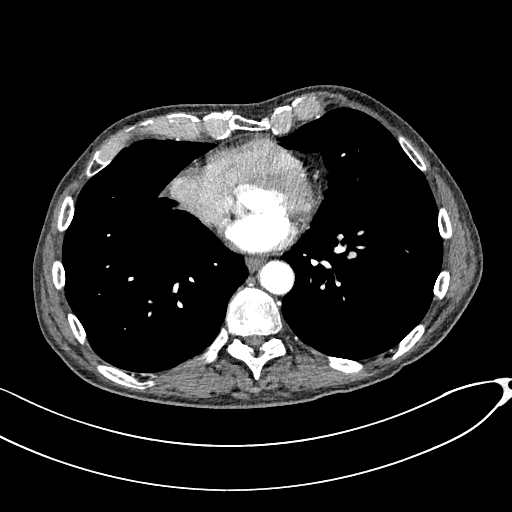
[im 71/184  lung]
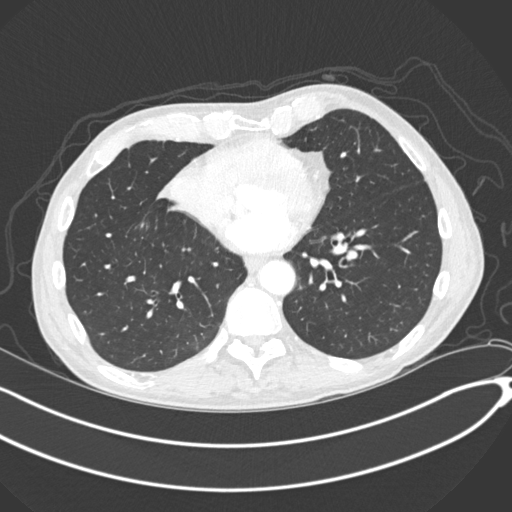
[im 85/184  lung]
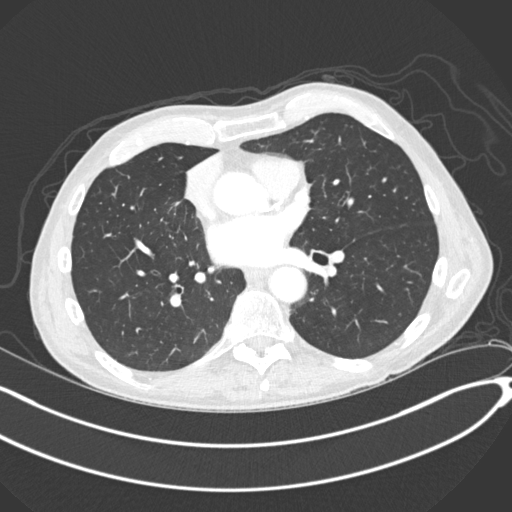
[im 99/184  lung]
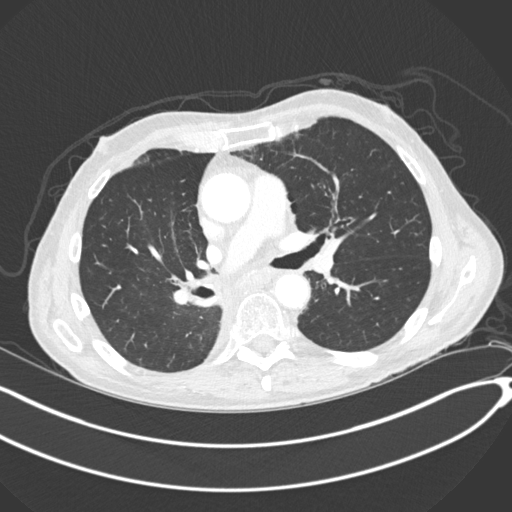
[im 113/184  lung]
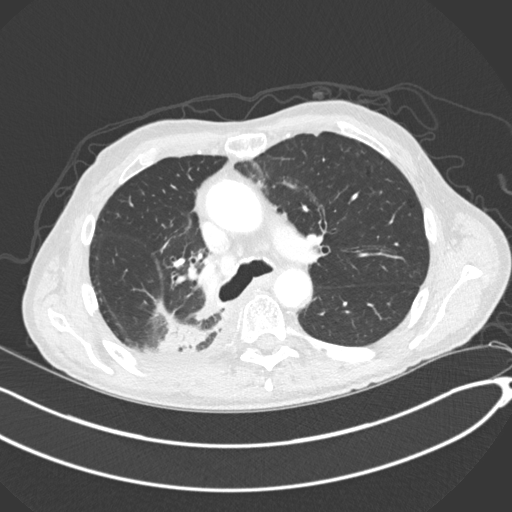
[im 127/184  mediastinal]
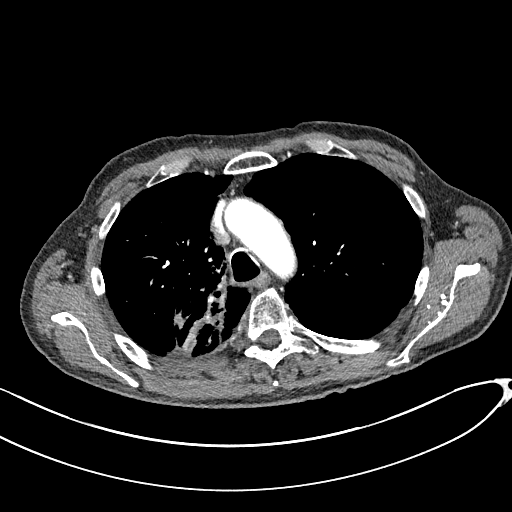
[im 127/184  lung]
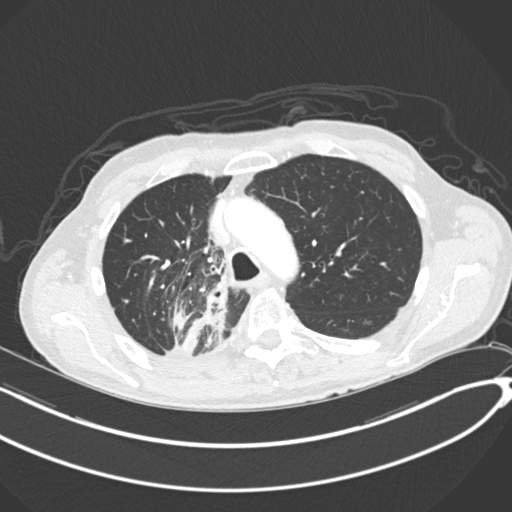
[im 141/184  lung]
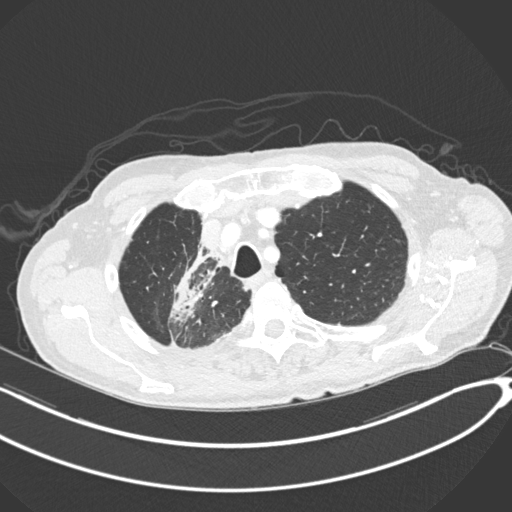
[im 155/184  lung]
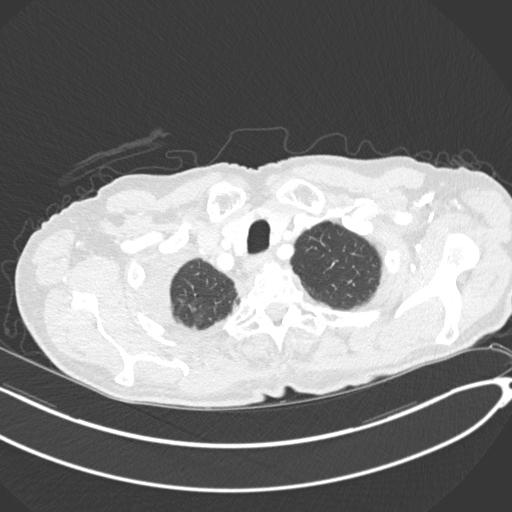
[im 169/184  lung]
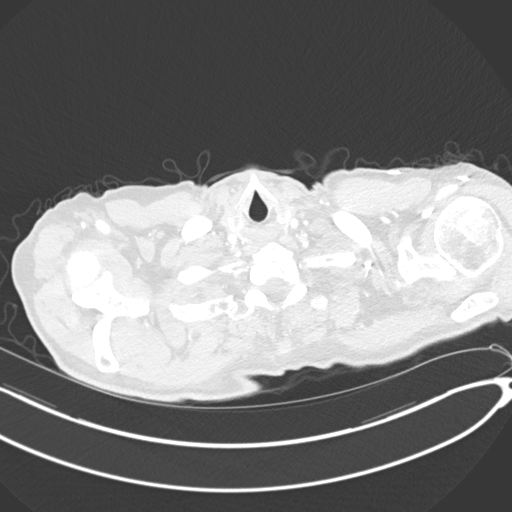

[Series 6: coronal · coronal · 0.76mm/px · 3 of 120 slices shown]
[im 24/120  lung]
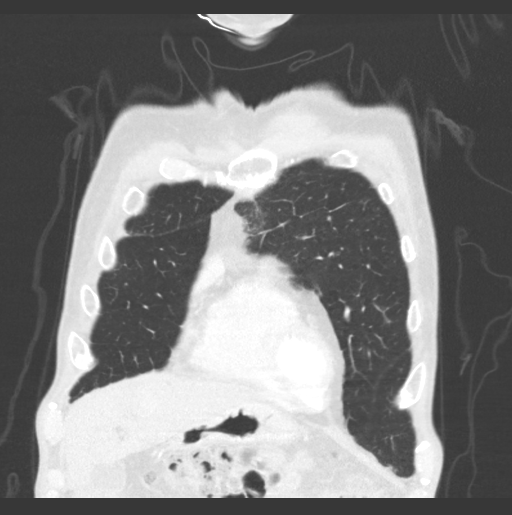
[im 48/120  lung]
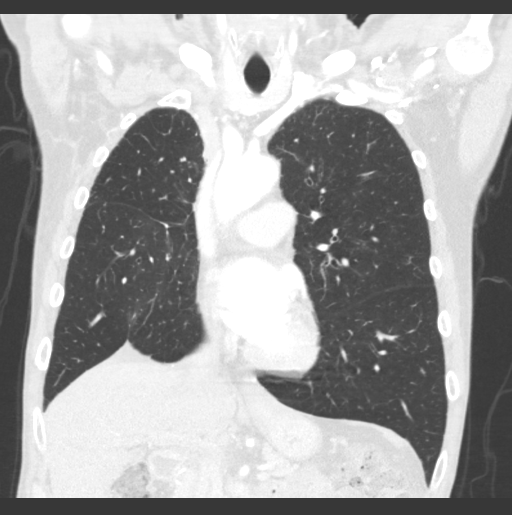
[im 72/120  lung]
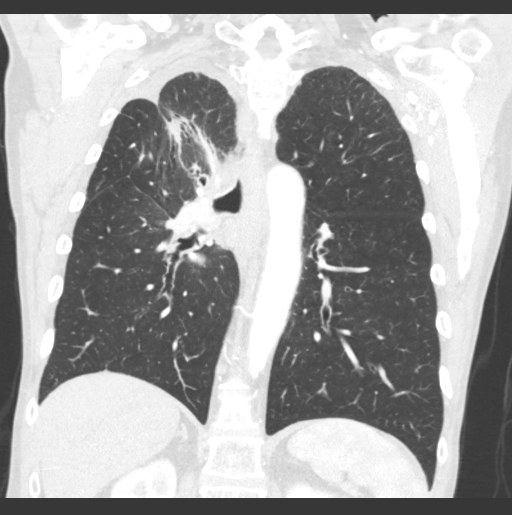

[15 of 36 positions shown; findings below may reference images not displayed]

FINDINGS: Cardiovascular: CP the heart is normal in size. No pericardial
effusion. Mild tortuosity, ectasia and atherosclerotic calcification
involving the thoracic aorta. No dissection or focal aneurysm.
Stable coronary artery calcifications.

Mediastinum/Nodes: Small scattered mediastinal and hilar lymph nodes
are stable. No findings for new adenopathy. The esophagus is grossly
normal. Thyroid gland is unremarkable.

Lungs/Pleura: Extensive radiation changes involving the right
paramediastinal lung and right hilum. I do not see any findings
suspicious for recurrent tumor.

Stable calcified granuloma the right lung base.

A few tiny scattered left lower lobe pulmonary nodules likely
inflammatory given their appearance. No findings suspicious for
pulmonary metastatic disease.

Upper Abdomen: Enlarging peripherally enhancing mass in the left
hepatic lobe in segment 4A highly suspicious for metastatic disease.
This measures approximately 4.5 x 4.0 cm. A few other smaller
low-attenuation lesions are suspected. MRI liver or PET-CT may be
helpful for further evaluation.

No findings suspicious for adrenal gland metastasis. No upper
abdominal lymphadenopathy.

Musculoskeletal: No significant bony findings. No evidence of
osseous metastatic disease.
IMPRESSION: 1. Extensive radiation changes involving the right paramediastinal
lung and right hilum but no findings suspicious for recurrent tumor.
2. Peripherally enhancing mass in the left hepatic lobe in segment
4A highly suspicious for metastatic disease. A few other smaller
low-attenuation lesions are suspected. MRI liver or PET-CT may be
helpful for further evaluation.
3. No findings suspicious for pulmonary metastatic disease.
4. Stable atherosclerotic calcifications involving the thoracic
aorta and coronary arteries.
5. Emphysema and aortic atherosclerosis.

Aortic Atherosclerosis ([LD]-[LD]) and Emphysema ([LD]-[LD]).

## 2020-08-08 MED ORDER — IOHEXOL 300 MG/ML  SOLN
75.0000 mL | Freq: Once | INTRAMUSCULAR | Status: AC | PRN
  Administered 2020-08-08: 75 mL via INTRAVENOUS

## 2020-08-12 ENCOUNTER — Other Ambulatory Visit: Payer: Self-pay

## 2020-08-12 ENCOUNTER — Inpatient Hospital Stay: Payer: Medicare Other

## 2020-08-12 ENCOUNTER — Inpatient Hospital Stay: Payer: Medicare Other | Attending: Physician Assistant | Admitting: Internal Medicine

## 2020-08-12 VITALS — BP 127/75 | HR 63 | Temp 97.8°F | Resp 14 | Ht 73.0 in | Wt 161.9 lb

## 2020-08-12 DIAGNOSIS — I10 Essential (primary) hypertension: Secondary | ICD-10-CM | POA: Diagnosis not present

## 2020-08-12 DIAGNOSIS — I4891 Unspecified atrial fibrillation: Secondary | ICD-10-CM | POA: Diagnosis not present

## 2020-08-12 DIAGNOSIS — Z9221 Personal history of antineoplastic chemotherapy: Secondary | ICD-10-CM | POA: Diagnosis not present

## 2020-08-12 DIAGNOSIS — C3411 Malignant neoplasm of upper lobe, right bronchus or lung: Secondary | ICD-10-CM | POA: Diagnosis not present

## 2020-08-12 DIAGNOSIS — Z79899 Other long term (current) drug therapy: Secondary | ICD-10-CM | POA: Diagnosis not present

## 2020-08-12 DIAGNOSIS — Z7901 Long term (current) use of anticoagulants: Secondary | ICD-10-CM | POA: Insufficient documentation

## 2020-08-12 DIAGNOSIS — C3491 Malignant neoplasm of unspecified part of right bronchus or lung: Secondary | ICD-10-CM | POA: Diagnosis not present

## 2020-08-12 DIAGNOSIS — K769 Liver disease, unspecified: Secondary | ICD-10-CM | POA: Diagnosis not present

## 2020-08-12 DIAGNOSIS — Z5112 Encounter for antineoplastic immunotherapy: Secondary | ICD-10-CM

## 2020-08-12 DIAGNOSIS — Z923 Personal history of irradiation: Secondary | ICD-10-CM | POA: Insufficient documentation

## 2020-08-12 LAB — CMP (CANCER CENTER ONLY)
ALT: 8 U/L (ref 0–44)
AST: 21 U/L (ref 15–41)
Albumin: 3.8 g/dL (ref 3.5–5.0)
Alkaline Phosphatase: 47 U/L (ref 38–126)
Anion gap: 10 (ref 5–15)
BUN: 6 mg/dL — ABNORMAL LOW (ref 8–23)
CO2: 20 mmol/L — ABNORMAL LOW (ref 22–32)
Calcium: 9 mg/dL (ref 8.9–10.3)
Chloride: 99 mmol/L (ref 98–111)
Creatinine: 0.7 mg/dL (ref 0.61–1.24)
GFR, Estimated: >60 mL/min (ref >60)
Glucose, Bld: 82 mg/dL (ref 70–99)
Potassium: 4.5 mmol/L (ref 3.5–5.1)
Sodium: 129 mmol/L — ABNORMAL LOW (ref 135–145)
Total Bilirubin: 0.4 mg/dL (ref 0.3–1.2)
Total Protein: 7.3 g/dL (ref 6.5–8.1)

## 2020-08-12 LAB — CBC WITH DIFFERENTIAL (CANCER CENTER ONLY)
Abs Immature Granulocytes: 0.02 10*3/uL (ref 0.00–0.07)
Basophils Absolute: 0 10*3/uL (ref 0.0–0.1)
Basophils Relative: 1 %
Eosinophils Absolute: 0.1 10*3/uL (ref 0.0–0.5)
Eosinophils Relative: 2 %
HCT: 39.5 % (ref 39.0–52.0)
Hemoglobin: 14.2 g/dL (ref 13.0–17.0)
Immature Granulocytes: 1 %
Lymphocytes Relative: 13 %
Lymphs Abs: 0.5 10*3/uL — ABNORMAL LOW (ref 0.7–4.0)
MCH: 33.5 pg (ref 26.0–34.0)
MCHC: 35.9 g/dL (ref 30.0–36.0)
MCV: 93.2 fL (ref 80.0–100.0)
Monocytes Absolute: 0.5 10*3/uL (ref 0.1–1.0)
Monocytes Relative: 13 %
Neutro Abs: 2.9 10*3/uL (ref 1.7–7.7)
Neutrophils Relative %: 70 %
Platelet Count: 199 10*3/uL (ref 150–400)
RBC: 4.24 MIL/uL (ref 4.22–5.81)
RDW: 12.7 % (ref 11.5–15.5)
WBC Count: 4.1 10*3/uL (ref 4.0–10.5)
nRBC: 0 % (ref 0.0–0.2)

## 2020-08-12 LAB — TSH: TSH: 2.465 u[IU]/mL (ref 0.320–4.118)

## 2020-08-12 NOTE — Progress Notes (Signed)
Christopher Harding Telephone:([phone]) [phone]   Fax:([phone]) [place], MD [address] [place] [address]  DIAGNOSIS:  Metastatic non-small cell lung cancer initially diagnosed as stage IIIA/B (T3, N1/N2, M0)non-small cell lung cancer, adenocarcinoma. He presented with a right upper lobe lung mass with direct extension across the major fissure into the superior segment of the right lower lobe and ipsilateral hilar lymphadenopathy. There is questionable subcarinal lymphadenopathy versus esophageal hypermetabolism this was evaluated by gastroenterology with upper endoscopy on 01/30/2020 and there was no mass lesion or malignancy in that area. The patient was diagnosed in May 2021.  Biomarker Findings Microsatellite status - MS-Stable Tumor Mutational Burden - 9 Muts/Mb Genomic Findings For a complete list of the genes assayed, please refer to the Appendix. KRAS G12C CDKN2A/B p16INK4a S12* NKX2-1 amplification - equivocal? PMS2 R3Q TP53 P45fs*38 7 Disease relevant genes with no reportable alterations: ALK, BRAF, EGFR, ERBB2, MET, RET, ROS1  PDL1: 70%  PRIOR THERAPY: Weekly concurrent chemoradiation with carboplatin for an AUC of 2 and paclitaxel 45 mg/m. First dose expected on 11/26/2019. Status post 7 cycles.   Last dose was given 01/07/2020 with partial response.   CURRENT THERAPY:  Consolidation treatment with immunotherapy with Imfinzi 1500 mg IV every 4 weeks.  First dose 02/19/2020. Status post 6 cycles.  INTERVAL HISTORY: Christopher Harding 70 y.o. male returns to the clinic today for follow-up visit accompanied by his wife.  The patient is feeling fine today with no concerning complaints except for mild fatigue and right upper quadrant abdominal pain started 2 weeks ago.  He denied having any current chest pain, shortness of breath, cough or hemoptysis.  He denied having any fever or chills.  He has no nausea,  vomiting, diarrhea or constipation.  He has no headache or visual changes.  The patient has been tolerating his treatment with Imfinzi fairly well.  He had repeat CT scan of the chest performed recently and he is here for evaluation and discussion of his scan results and treatment options.  MEDICAL HISTORY: Past Medical History:  Diagnosis Date  . A-fib [facility])    s/p DCCV 12/07/18  . Cancer ([place])   . Depression    situational  . Dysrhythmia   . Hypertension     ALLERGIES:  has No Known Allergies.  MEDICATIONS:  Current Outpatient Medications  Medication Sig Dispense Refill  . ibuprofen (ADVIL) 200 MG tablet Take 400 mg by mouth every 8 (eight) hours as needed (for pain).  (Patient not taking: No sig reported)    . metoprolol tartrate (LOPRESSOR) 50 MG tablet Take 50 mg by mouth 2 (two) times daily with a meal.    . Omeprazole (PRILOSEC PO) Take 20 mg by mouth. (Patient not taking: Reported on 07/15/2020)    . prochlorperazine (COMPAZINE) 10 MG tablet Take 1 tablet (10 mg total) by mouth every 6 (six) hours as needed. (Patient not taking: No sig reported) 30 tablet 2  . sucralfate (CARAFATE) 1 g tablet Crush and mix in 1 oz water and drink 5 min TID before meals and at HS for radiation induced esophagitis (Patient not taking: Reported on 07/15/2020) 120 tablet 2  . XARELTO 20 MG TABS tablet Take 20 mg by mouth daily in the afternoon.      Current Facility-Administered Medications  Medication Dose Route Frequency Provider Last Rate Last Admin  . 0.9 %  sodium chloride infusion  500 mL  Intravenous Continuous [place], MD      . 0.9 %  sodium chloride infusion  500 mL Intravenous Once [place] Park, MD        SURGICAL HISTORY:  Past Surgical History:  Procedure Laterality Date  . CARDIOVERSION N/A 12/07/2018   Procedure: CARDIOVERSION;  Surgeon: Josue Hector, MD;  Location: [facility] ENDOSCOPY;  Service: Cardiovascular;  Laterality: N/A;  . FINE NEEDLE ASPIRATION  10/30/2019    Procedure: FINE NEEDLE ASPIRATION (FNA) LINEAR;  Surgeon: Candee Furbish, MD;  Location: [facility] ENDOSCOPY;  Service: Pulmonary;;  . INGUINAL HERNIA REPAIR Right 01/25/2019   Procedure: RIGHT INGUINAL HERNIA REPAIR WITH MESH;  Surgeon: Coralie Keens, MD;  Location: [place];  Service: General;  Laterality: Right;  TAP BLOCK  . INSERTION OF MESH Right 01/25/2019   Procedure: Insertion Of Mesh;  Surgeon: Coralie Keens, MD;  Location: [place];  Service: General;  Laterality: Right;  Marland Kitchen VIDEO BRONCHOSCOPY WITH ENDOBRONCHIAL ULTRASOUND N/A 10/30/2019   Procedure: VIDEO BRONCHOSCOPY WITH ENDOBRONCHIAL ULTRASOUND;  Surgeon: Candee Furbish, MD;  Location: [facility] ENDOSCOPY;  Service: Pulmonary;  Laterality: N/A;    REVIEW OF SYSTEMS:  Constitutional: positive for fatigue Eyes: negative Ears, nose, mouth, throat, and face: negative Respiratory: negative Cardiovascular: negative Gastrointestinal: positive for abdominal pain Genitourinary:negative Integument/breast: negative Hematologic/lymphatic: negative Musculoskeletal:negative Neurological: negative Behavioral/Psych: negative Endocrine: negative Allergic/Immunologic: negative   PHYSICAL EXAMINATION: General appearance: alert, cooperative and no distress Head: Normocephalic, without obvious abnormality, atraumatic Neck: no adenopathy, no JVD, supple, symmetrical, trachea midline and thyroid not enlarged, symmetric, no tenderness/mass/nodules Lymph nodes: Cervical, supraclavicular, and axillary nodes normal. Resp: clear to auscultation bilaterally Back: symmetric, no curvature. ROM normal. No CVA tenderness. Cardio: regular rate and rhythm, S1, S2 normal, no murmur, click, rub or gallop GI: soft, non-tender; bowel sounds normal; no masses,  no organomegaly Extremities: extremities normal, atraumatic, no cyanosis or edema Neurologic: Alert and oriented X 3, normal strength and tone. Normal symmetric reflexes. Normal coordination and gait  ECOG  PERFORMANCE STATUS: 1 - Symptomatic but completely ambulatory  Blood pressure 127/75, pulse 63, temperature 97.8 F (36.6 C), temperature source Tympanic, resp. rate 14, height $RemoveBe'6\' 1"'yAbIRkIty$  (1.854 m), weight 161 lb 14.4 oz (73.4 kg), SpO2 99 %.  LABORATORY DATA: Lab Results  Component Value Date   WBC 4.1 08/12/2020   HGB 14.2 08/12/2020   HCT 39.5 08/12/2020   MCV 93.2 08/12/2020   PLT 199 08/12/2020      Chemistry      Component Value Date/Time   NA 130 (L) 07/15/2020 0930   NA 129 (L) 11/24/2018 1520   K 4.4 07/15/2020 0930   CL 100 07/15/2020 0930   CO2 21 (L) 07/15/2020 0930   BUN 5 (L) 07/15/2020 0930   BUN 8 11/24/2018 1520   CREATININE 0.71 07/15/2020 0930      Component Value Date/Time   CALCIUM 8.9 07/15/2020 0930   ALKPHOS 45 07/15/2020 0930   AST 21 07/15/2020 0930   ALT 9 07/15/2020 0930   BILITOT 0.6 07/15/2020 0930       RADIOGRAPHIC STUDIES: CT Chest W Contrast  Result Date: 08/10/2020 CLINICAL DATA:  Restaging non-small cell lung cancer. History of chemotherapy and radiation therapy. EXAM: CT CHEST WITH CONTRAST TECHNIQUE: Multidetector CT imaging of the chest was performed during intravenous contrast administration. CONTRAST:  74mL OMNIPAQUE IOHEXOL 300 MG/ML  SOLN COMPARISON:  PET-CT 11/12/2019 and chest CTs 02/05/2020 and 05/19/2020 FINDINGS: Cardiovascular: CP the heart is normal in size. No pericardial effusion. Mild  tortuosity, ectasia and atherosclerotic calcification involving the thoracic aorta. No dissection or focal aneurysm. Stable coronary artery calcifications. Mediastinum/Nodes: Small scattered mediastinal and hilar lymph nodes are stable. No findings for new adenopathy. The esophagus is grossly normal. Thyroid gland is unremarkable. Lungs/Pleura: Extensive radiation changes involving the right paramediastinal lung and right hilum. I do not see any findings suspicious for recurrent tumor. Stable calcified granuloma the right lung base. A few tiny  scattered left lower lobe pulmonary nodules likely inflammatory given their appearance. No findings suspicious for pulmonary metastatic disease. Upper Abdomen: Enlarging peripherally enhancing mass in the left hepatic lobe in segment 4A highly suspicious for metastatic disease. This measures approximately 4.5 x 4.0 cm. A few other smaller low-attenuation lesions are suspected. MRI liver or PET-CT may be helpful for further evaluation. No findings suspicious for adrenal gland metastasis. No upper abdominal lymphadenopathy. Musculoskeletal: No significant bony findings. No evidence of osseous metastatic disease. IMPRESSION: 1. Extensive radiation changes involving the right paramediastinal lung and right hilum but no findings suspicious for recurrent tumor. 2. Peripherally enhancing mass in the left hepatic lobe in segment 4A highly suspicious for metastatic disease. A few other smaller low-attenuation lesions are suspected. MRI liver or PET-CT may be helpful for further evaluation. 3. No findings suspicious for pulmonary metastatic disease. 4. Stable atherosclerotic calcifications involving the thoracic aorta and coronary arteries. 5. Emphysema and aortic atherosclerosis. Aortic Atherosclerosis (ICD10-I70.0) and Emphysema (ICD10-J43.9). Electronically Signed   By: Marijo Sanes M.D.   On: 08/10/2020 17:09    ASSESSMENT AND PLAN: This is a very pleasant 70 years old white male with suspicious metastatic non-small cell lung cancer initially diagnosed as stage IIIA/B (T3, N1/N2, M0) non-small cell lung cancer, adenocarcinoma presented with right upper lobe lung mass with direct extension across the major fissure into the superior segment of the right lower lobe and ipsilateral hilar lymphadenopathy as well as questionable subcarinal lymphadenopathy. The patient completed a course of concurrent chemoradiation with weekly carboplatin and paclitaxel status post 7 cycles.  He tolerated this treatment well with no  concerning adverse effect except for mild sore throat and mouth. The patient had partial response to this treatment. The patient is currently undergoing a course of consolidation treatment with immunotherapy with Imfinzi 1500 mg IV every 4 weeks status post 6 cycles.  He has been tolerating his treatment well with no concerning adverse effects. The patient had repeat CT scan of the chest performed recently.  I personally and independently reviewed the scan images and discussed the result and showed the images to the patient and his wife. His scan showed no concerning findings for disease progression in the chest but the patient has a highly suspicious liver lesion concerning for metastatic disease. I recommended for the patient to undergo ultrasound-guided core biopsy of the suspicious liver lesion for confirmation of the disease metastasis. If the final pathology is consistent with metastatic adenocarcinoma of the lung, I may consider the patient for treatment with second line treatment with Lumakras (Sotorasib) 960 mg p.o. daily. We will hold his treatment with Imfinzi for now until confirmation of the liver lesion. The patient and his wife agreed to the current plan. He was advised to call immediately if he has any concerning symptoms in the interval.  The patient voices understanding of current disease status and treatment options and is in agreement with the current care plan.  All questions were answered. The patient knows to call the clinic with any problems, questions or concerns. We can certainly  see the patient much sooner if necessary.  Disclaimer: This note was dictated with voice recognition software. Similar sounding words can inadvertently be transcribed and may not be corrected upon review.

## 2020-08-13 ENCOUNTER — Telehealth: Payer: Self-pay | Admitting: Medical Oncology

## 2020-08-13 ENCOUNTER — Telehealth (HOSPITAL_COMMUNITY): Payer: Self-pay

## 2020-08-13 ENCOUNTER — Telehealth: Payer: Self-pay | Admitting: Internal Medicine

## 2020-08-13 ENCOUNTER — Encounter (HOSPITAL_COMMUNITY): Payer: Self-pay

## 2020-08-13 NOTE — Telephone Encounter (Signed)
Confirmed BX appt for next week.

## 2020-08-13 NOTE — Telephone Encounter (Signed)
Scheduled per los. Called and left msg. Mailed printout  °

## 2020-08-13 NOTE — Progress Notes (Unsigned)
RC     1       Ashby Dawes. [facility] Male, 70 y.o., Apr 07, 1951  MRN:  [id number] Phone:  [phone] (H) ...       PCP:  Lujean Amel, MD Coverage:  Sherre Poot Blue Shield Medicare/Bcbs Medicare  Next Appt With Radiology ([facility]-US 2) 08/18/2020 at 1:00 PM           RE: Biopsy Received: Today  Message Details  Suttle, Rosanne Ashing, MD  Lenore Cordia Deal, Cephus Shelling, RDMS Totally fair, using CEUS shouldn't delay diagnosis and care for this pt.    Hopefully we'll have it up and running soon and it won't require special coordination.   Thanks!   [name]    Previous Messages  ----- Message -----  From: Lenore Cordia  Sent: 08/13/2020 11:59 AM EST  To: Suzette Battiest, MD  Subject: RE: Biopsy                    Hello Dr. Serafina Royals   I am looking at Cathie Hoops are available on the 18th but both  Time slots are booked at [facility].  Then I am looking at the next time [facility] are [facility]  It is not until March 29th...  Wife said she did not want to wait that long and Dr Julien Nordmann said within 10 days.   (unless I am reading this schedule wrong)  ----- Message -----  From: Suzette Battiest, MD  Sent: 08/12/2020  3:55 PM EST  To: Adela Ports, RT, *  Subject: RE: Biopsy                    Approved for ultrasound guided liver mass biopsy.    This patient would benefit from contrast enhanced ultrasound for biopsy guidance, if possible to set up at [facility]. If able to use contrast soon, please schedule on my day. I've copied Melody Comas and Edna who would be instrumental in arranging contrast use, if able. If scheduling, software, etc preclude timely biopsy, this could be performed per usual by any IR.   Thank you,

## 2020-08-13 NOTE — Progress Notes (Signed)
OKay to hold blood thinners  Christopher Harding. [facility] Male, 70 y.o., 1950-07-11  MRN:  [id number] Phone:  [phone] (H) ...       PCP:  Lujean Amel, MD Coverage:  Sherre Poot Blue Shield Medicare/Bcbs Medicare  Next Appt With Radiology ([facility] 2) 08/18/2020 at 1:00 PM           RE: Biopsy Received: Today  Message Details  Curt Bears, MD  Kirstie Peri, RN Yes. Please hold it for the procedure.    Previous Messages  ----- Message -----  From: Lenore Cordia  Sent: 08/13/2020  9:10 AM EST  To: Curt Bears, MD, Ardeen Garland, RN  Subject: FW: Biopsy                    Good Morning Dr Julien Nordmann,   Pt. Christopher Harding is on the Blood thinner  Xarelto. He would need to hold it one day  Prior to his biopsy date.   Permission is needed.   [place]

## 2020-08-15 ENCOUNTER — Other Ambulatory Visit: Payer: Self-pay | Admitting: Radiology

## 2020-08-18 ENCOUNTER — Other Ambulatory Visit: Payer: Self-pay | Admitting: Internal Medicine

## 2020-08-18 ENCOUNTER — Ambulatory Visit (HOSPITAL_COMMUNITY)
Admission: RE | Admit: 2020-08-18 | Discharge: 2020-08-18 | Disposition: A | Discharge status: Home / Self Care | Payer: Medicare Other | Source: Ambulatory Visit | Attending: Internal Medicine | Admitting: Internal Medicine

## 2020-08-18 ENCOUNTER — Encounter (HOSPITAL_COMMUNITY): Payer: Self-pay

## 2020-08-18 ENCOUNTER — Other Ambulatory Visit: Payer: Self-pay

## 2020-08-18 ENCOUNTER — Telehealth: Payer: Self-pay | Admitting: Medical Oncology

## 2020-08-18 DIAGNOSIS — Z87891 Personal history of nicotine dependence: Secondary | ICD-10-CM | POA: Diagnosis not present

## 2020-08-18 DIAGNOSIS — C3491 Malignant neoplasm of unspecified part of right bronchus or lung: Secondary | ICD-10-CM

## 2020-08-18 DIAGNOSIS — K7689 Other specified diseases of liver: Secondary | ICD-10-CM | POA: Diagnosis not present

## 2020-08-18 DIAGNOSIS — R160 Hepatomegaly, not elsewhere classified: Secondary | ICD-10-CM

## 2020-08-18 DIAGNOSIS — R9389 Abnormal findings on diagnostic imaging of other specified body structures: Secondary | ICD-10-CM

## 2020-08-18 DIAGNOSIS — I10 Essential (primary) hypertension: Secondary | ICD-10-CM | POA: Diagnosis not present

## 2020-08-18 DIAGNOSIS — R928 Other abnormal and inconclusive findings on diagnostic imaging of breast: Secondary | ICD-10-CM | POA: Diagnosis not present

## 2020-08-18 DIAGNOSIS — Z7901 Long term (current) use of anticoagulants: Secondary | ICD-10-CM | POA: Diagnosis not present

## 2020-08-18 DIAGNOSIS — Z79899 Other long term (current) drug therapy: Secondary | ICD-10-CM | POA: Insufficient documentation

## 2020-08-18 DIAGNOSIS — I4891 Unspecified atrial fibrillation: Secondary | ICD-10-CM | POA: Diagnosis not present

## 2020-08-18 DIAGNOSIS — C3490 Malignant neoplasm of unspecified part of unspecified bronchus or lung: Secondary | ICD-10-CM | POA: Diagnosis not present

## 2020-08-18 DIAGNOSIS — Z8249 Family history of ischemic heart disease and other diseases of the circulatory system: Secondary | ICD-10-CM | POA: Insufficient documentation

## 2020-08-18 LAB — CBC WITH DIFFERENTIAL/PLATELET
Abs Immature Granulocytes: 0.02 10*3/uL (ref 0.00–0.07)
Basophils Absolute: 0 10*3/uL (ref 0.0–0.1)
Basophils Relative: 1 %
Eosinophils Absolute: 0 10*3/uL (ref 0.0–0.5)
Eosinophils Relative: 1 %
HCT: 40.3 % (ref 39.0–52.0)
Hemoglobin: 14.2 g/dL (ref 13.0–17.0)
Immature Granulocytes: 0 %
Lymphocytes Relative: 8 %
Lymphs Abs: 0.5 10*3/uL — ABNORMAL LOW (ref 0.7–4.0)
MCH: 33.7 pg (ref 26.0–34.0)
MCHC: 35.2 g/dL (ref 30.0–36.0)
MCV: 95.7 fL (ref 80.0–100.0)
Monocytes Absolute: 0.7 10*3/uL (ref 0.1–1.0)
Monocytes Relative: 11 %
Neutro Abs: 5 10*3/uL (ref 1.7–7.7)
Neutrophils Relative %: 79 %
Platelets: 209 10*3/uL (ref 150–400)
RBC: 4.21 MIL/uL — ABNORMAL LOW (ref 4.22–5.81)
RDW: 13.1 % (ref 11.5–15.5)
WBC: 6.3 10*3/uL (ref 4.0–10.5)
nRBC: 0 % (ref 0.0–0.2)

## 2020-08-18 LAB — COMPREHENSIVE METABOLIC PANEL
ALT: 11 U/L (ref 0–44)
AST: 21 U/L (ref 15–41)
Albumin: 3.8 g/dL (ref 3.5–5.0)
Alkaline Phosphatase: 43 U/L (ref 38–126)
Anion gap: 8 (ref 5–15)
BUN: 6 mg/dL — ABNORMAL LOW (ref 8–23)
CO2: 22 mmol/L (ref 22–32)
Calcium: 8.9 mg/dL (ref 8.9–10.3)
Chloride: 97 mmol/L — ABNORMAL LOW (ref 98–111)
Creatinine, Ser: 0.68 mg/dL (ref 0.61–1.24)
GFR, Estimated: >60 mL/min (ref >60)
Glucose, Bld: 84 mg/dL (ref 70–99)
Potassium: 4.2 mmol/L (ref 3.5–5.1)
Sodium: 127 mmol/L — ABNORMAL LOW (ref 135–145)
Total Bilirubin: 0.7 mg/dL (ref 0.3–1.2)
Total Protein: 7.2 g/dL (ref 6.5–8.1)

## 2020-08-18 LAB — PROTIME-INR
INR: 1.1 (ref 0.8–1.2)
Prothrombin Time: 14.1 seconds (ref 11.4–15.2)

## 2020-08-18 IMAGING — US US ABDOMEN LIMITED RUQ/ASCITES
1 series · 14 of 19 positions shown · non-contrast
Comparison: [DATE]

CLINICAL DATA: 70-year-old male with non-small cell lung cancer in
incidentally noted segment 4 a peripherally enhancing mass on recent
chest CT. Presents today for ultrasound-guided liver mass biopsy.

EXAM:
ULTRASOUND ABDOMEN LIMITED RIGHT UPPER QUADRANT

[Series 1: us abdomen limited ruq/ascites · 14 of 19 slices shown]
[im 1/19]
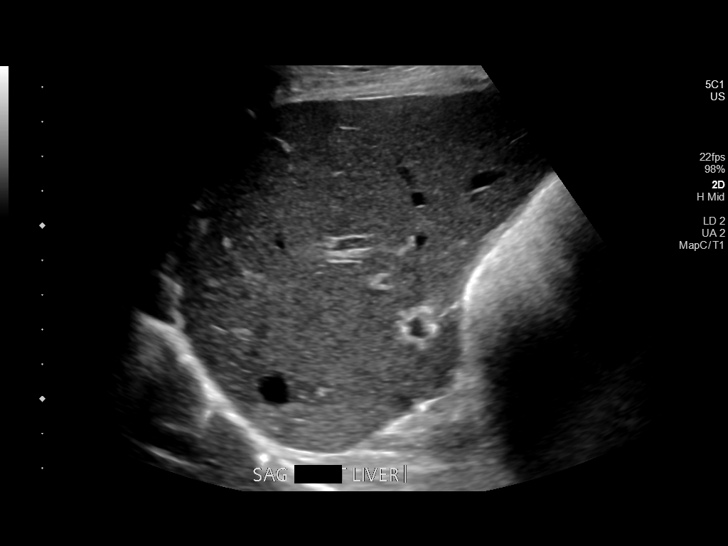
[im 3/19]
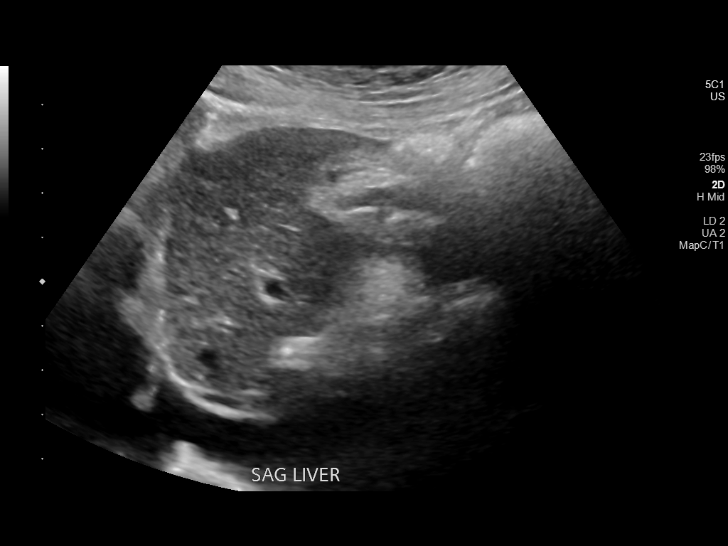
[im 4/19]
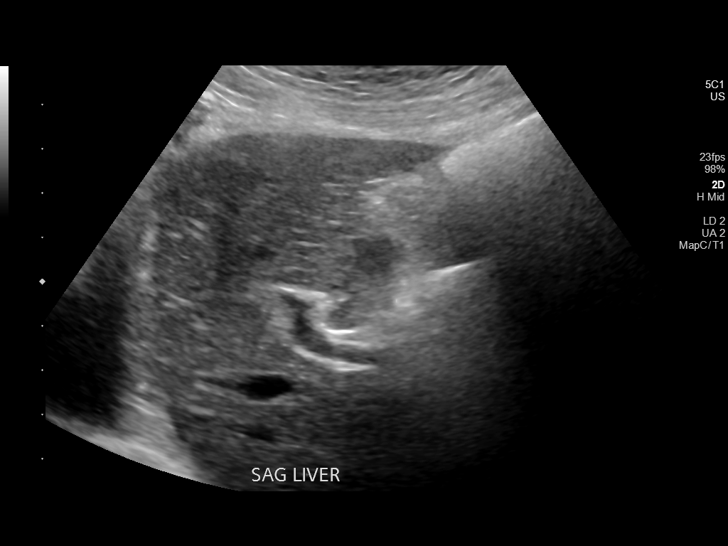
[im 5/19]
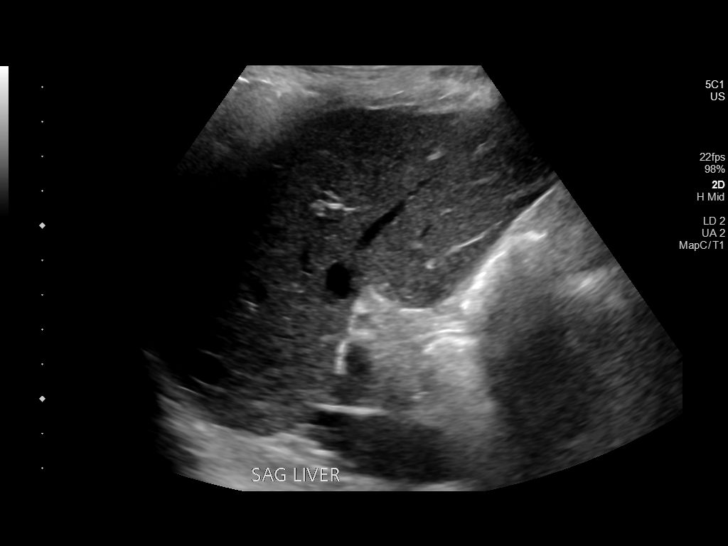
[im 7/19]
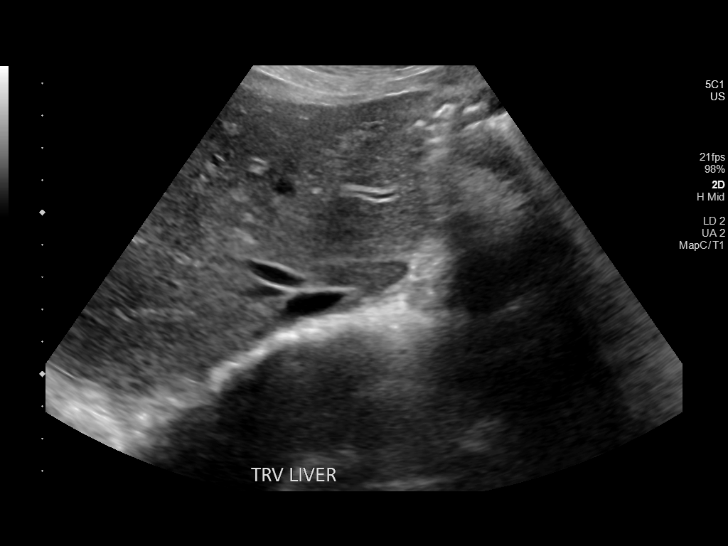
[im 8/19]
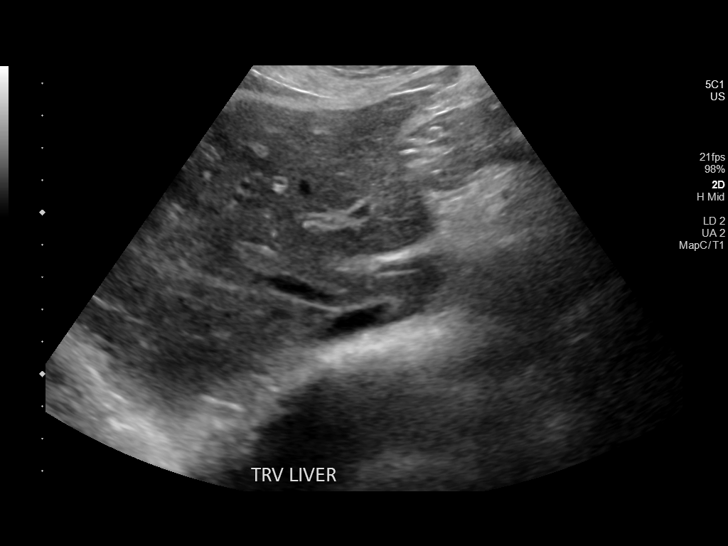
[im 9/19]
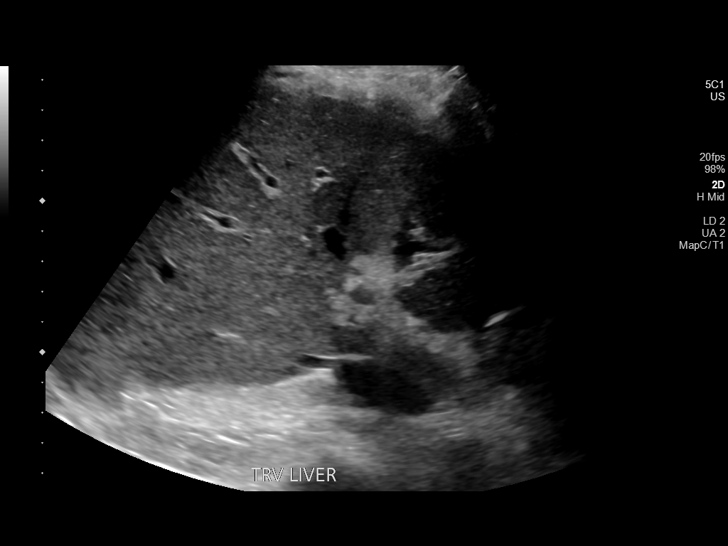
[im 11/19]
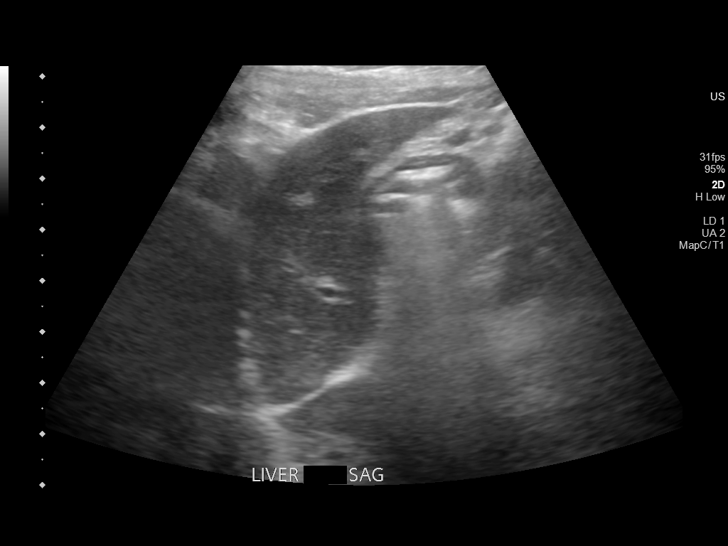
[im 12/19]
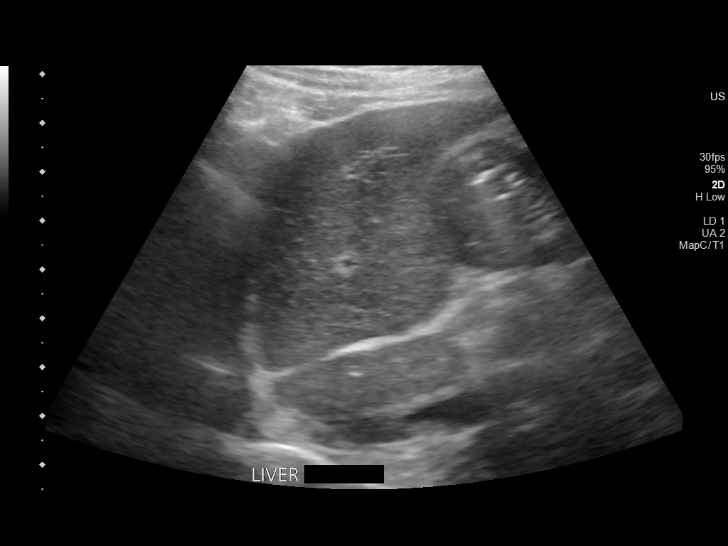
[im 13/19]
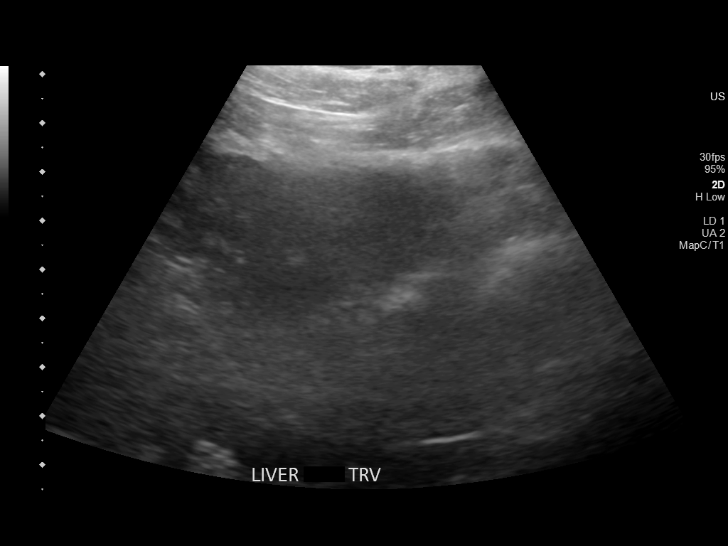
[im 15/19]
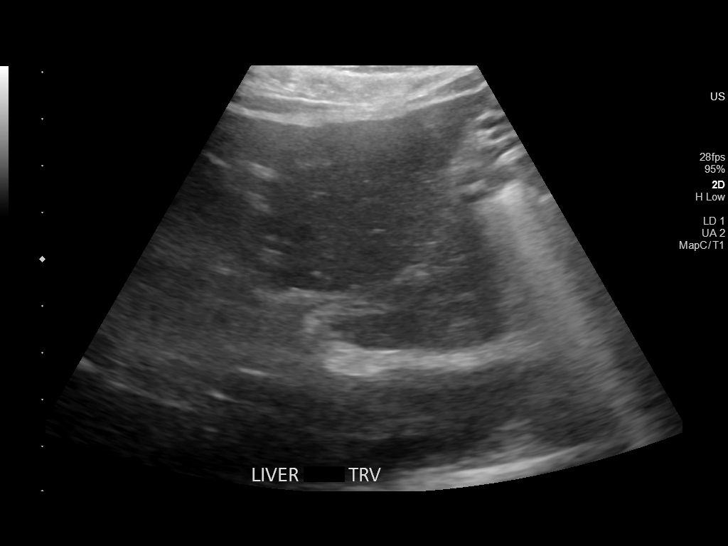
[im 16/19]
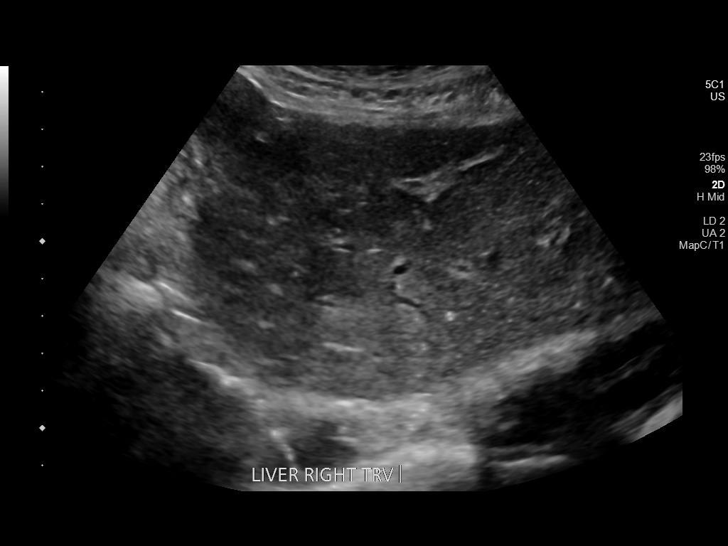
[im 17/19]
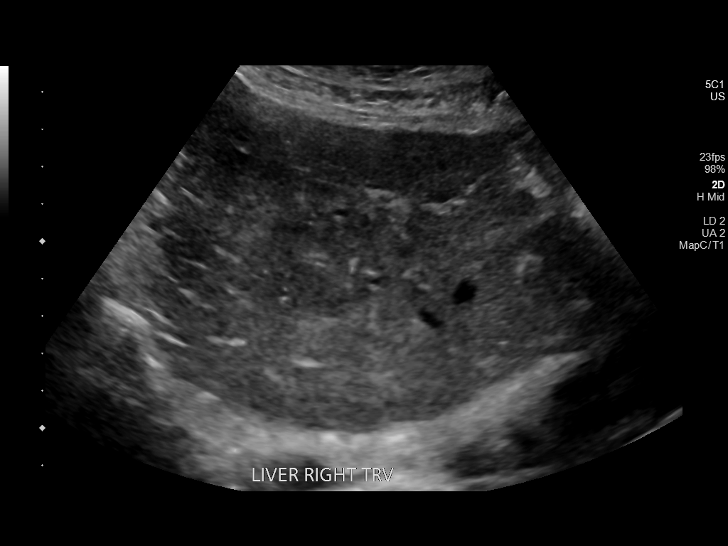
[im 19/19]
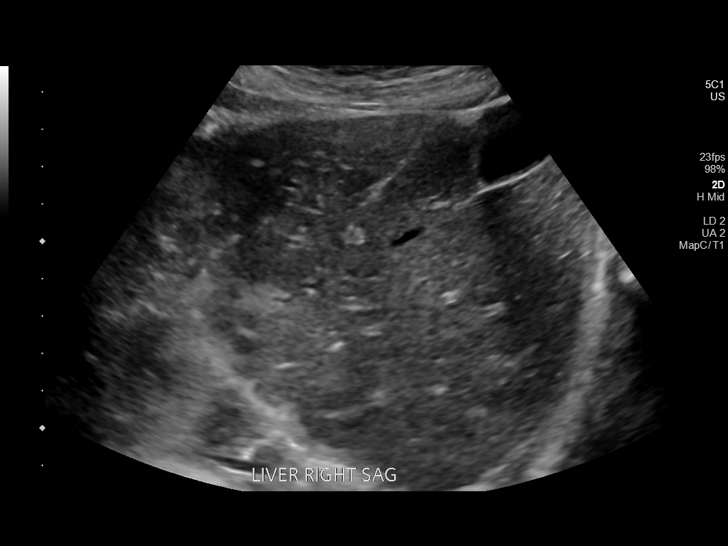

[14 of 19 positions shown; findings below may reference images not displayed]

FINDINGS: No focal lesion identified. Within normal limits in parenchymal
echogenicity and echotexture.
IMPRESSION: The previously identified segment 4A mass is completely
sonographically occult. No biopsy was performed.

PLAN:
The patient is rescheduled for contrast enhanced ultrasound guided
liver mass biopsy later this week.

## 2020-08-18 MED ORDER — FENTANYL CITRATE (PF) 100 MCG/2ML IJ SOLN
INTRAMUSCULAR | Status: AC | PRN
  Administered 2020-08-18: 50 ug via INTRAVENOUS

## 2020-08-18 MED ORDER — FENTANYL CITRATE (PF) 100 MCG/2ML IJ SOLN
INTRAMUSCULAR | Status: AC
  Filled 2020-08-18: qty 2

## 2020-08-18 MED ORDER — SODIUM CHLORIDE 0.9 % IV SOLN: INTRAVENOUS | Status: DC

## 2020-08-18 MED ORDER — MIDAZOLAM HCL 2 MG/2ML IJ SOLN
INTRAMUSCULAR | Status: AC | PRN
  Administered 2020-08-18: 1 mg via INTRAVENOUS

## 2020-08-18 MED ORDER — MIDAZOLAM HCL 2 MG/2ML IJ SOLN
INTRAMUSCULAR | Status: AC
  Filled 2020-08-18: qty 2

## 2020-08-18 MED ORDER — GELATIN ABSORBABLE 12-7 MM EX MISC
CUTANEOUS | Status: AC
  Filled 2020-08-18: qty 1

## 2020-08-18 MED ORDER — LIDOCAINE HCL 1 % IJ SOLN
INTRAMUSCULAR | Status: AC
  Filled 2020-08-18: qty 20

## 2020-08-18 NOTE — Procedures (Signed)
Interventional Radiology Procedure Note  Procedure: Limited Liver Ultrasound  Findings: Mass in segment IVa seen on recent CT is sonographically occult.  No biopsy performed.  Complications: None  Estimated Blood Loss: None  Recommendations: Will arrange for contrast enhanced ultrasound guided liver mass biopsy at [facility] on 08/21/19.  Patient amenable and states understanding of plan.    Ruthann Cancer, MD Pager: [phone]

## 2020-08-18 NOTE — Telephone Encounter (Signed)
Confirmed appt wed at [facility] and possible bx .

## 2020-08-18 NOTE — Discharge Instructions (Signed)
Please call Interventional Radiology clinic [phone] with any questions or concerns.   Moderate Conscious Sedation, Adult, Care After This sheet gives you information about how to care for yourself after your procedure. Your health care provider may also give you more specific instructions. If you have problems or questions, contact your health care provider. What can I expect after the procedure? After the procedure, it is common to have:  Sleepiness for several hours.  Impaired judgment for several hours.  Difficulty with balance.  Vomiting if you eat too soon. Follow these instructions at home: For the time period you were told by your health care provider:  Rest.  Do not participate in activities where you could fall or become injured.  Do not drive or use machinery.  Do not drink alcohol.  Do not take sleeping pills or medicines that cause drowsiness.  Do not make important decisions or sign legal documents.  Do not take care of children on your own.      Eating and drinking  Follow the diet recommended by your health care provider.  Drink enough fluid to keep your urine pale yellow.  If you vomit: ? Drink water, juice, or soup when you can drink without vomiting. ? Make sure you have little or no nausea before eating solid foods.   General instructions  Take over-the-counter and prescription medicines only as told by your health care provider.  Have a responsible adult stay with you for the time you are told. It is important to have someone help care for you until you are awake and alert.  Do not smoke.  Keep all follow-up visits as told by your health care provider. This is important. Contact a health care provider if:  You are still sleepy or having trouble with balance after 24 hours.  You feel light-headed.  You keep feeling nauseous or you keep vomiting.  You develop a rash.  You have a fever.  You have redness or swelling around the IV  site. Get help right away if:  You have trouble breathing.  You have new-onset confusion at home. Summary  After the procedure, it is common to feel sleepy, have impaired judgment, or feel nauseous if you eat too soon.  Rest after you get home. Know the things you should not do after the procedure.  Follow the diet recommended by your health care provider and drink enough fluid to keep your urine pale yellow.  Get help right away if you have trouble breathing or new-onset confusion at home. This information is not intended to replace advice given to you by your health care provider. Make sure you discuss any questions you have with your health care provider. Document Revised: 09/21/2019 Document Reviewed: 04/19/2019 Elsevier Patient Education  2021 [employer].

## 2020-08-18 NOTE — H&P (Signed)
Chief Complaint: Patient was seen in consultation today for liver lesion biopsy.  Referring Physician(s): [place], [place] Physician: Ruthann Cancer  Patient Status: [facility] - Out-pt  History of Present Illness: Christopher Harding is a 70 y.o. male with a medical history significant for HTN, atrial fibrillation (Xarelto) and non-small cell lung cancer, diagnosed May 2021. He developed right upper quadrant pain several weeks ago. Imaging was obtained.  CT chest with contrast 08/08/20 Upper Abdomen: Enlarging peripherally enhancing mass in the left hepatic lobe in segment 4A highly suspicious for metastatic disease. This measures approximately 4.5 x 4.0 cm. A few other smaller low-attenuation lesions are suspected. MRI liver or PET-CT may be helpful for further evaluation. IMPRESSION: 1. Extensive radiation changes involving the right paramediastinal lung and right hilum but no findings suspicious for recurrent tumor. 2. Peripherally enhancing mass in the left hepatic lobe in segment 4A highly suspicious for metastatic disease. A few other smaller low-attenuation lesions are suspected. MRI liver or PET-CT may be helpful for further evaluation. 3. No findings suspicious for pulmonary metastatic disease. 4. Stable atherosclerotic calcifications involving the thoracic aorta and coronary arteries. 5. Emphysema and aortic atherosclerosis.  Interventional Radiology has been asked to evaluate this patient for an image-guided liver lesion biopsy for further work up. Imaging has been reviewed and procedure approved by Dr. Serafina Royals.   Past Medical History:  Diagnosis Date  . A-fib [facility])    s/p DCCV 12/07/18  . Cancer ([place])   . Depression    situational  . Dysrhythmia   . Hypertension     Past Surgical History:  Procedure Laterality Date  . CARDIOVERSION N/A 12/07/2018   Procedure: CARDIOVERSION;  Surgeon: Josue Hector, MD;  Location: [facility] ENDOSCOPY;  Service: Cardiovascular;   Laterality: N/A;  . FINE NEEDLE ASPIRATION  10/30/2019   Procedure: FINE NEEDLE ASPIRATION (FNA) LINEAR;  Surgeon: Candee Furbish, MD;  Location: [facility] ENDOSCOPY;  Service: Pulmonary;;  . INGUINAL HERNIA REPAIR Right 01/25/2019   Procedure: RIGHT INGUINAL HERNIA REPAIR WITH MESH;  Surgeon: Coralie Keens, MD;  Location: [place];  Service: General;  Laterality: Right;  TAP BLOCK  . INSERTION OF MESH Right 01/25/2019   Procedure: Insertion Of Mesh;  Surgeon: Coralie Keens, MD;  Location: [facility];  Service: General;  Laterality: Right;  Marland Kitchen VIDEO BRONCHOSCOPY WITH ENDOBRONCHIAL ULTRASOUND N/A 10/30/2019   Procedure: VIDEO BRONCHOSCOPY WITH ENDOBRONCHIAL ULTRASOUND;  Surgeon: Candee Furbish, MD;  Location: [facility] ENDOSCOPY;  Service: Pulmonary;  Laterality: N/A;    Allergies: Patient has no known allergies.  Medications: Prior to Admission medications   Medication Sig Start Date End Date Taking? Authorizing Provider  metoprolol tartrate (LOPRESSOR) 50 MG tablet Take 50 mg by mouth 2 (two) times daily with a meal. 09/14/18  Yes [provider]  Omeprazole (PRILOSEC PO) Take 20 mg by mouth.   Yes [provider]  ibuprofen (ADVIL) 200 MG tablet Take 400 mg by mouth every 8 (eight) hours as needed (for pain).  Patient not taking: No sig reported    [provider]  prochlorperazine (COMPAZINE) 10 MG tablet Take 1 tablet (10 mg total) by mouth every 6 (six) hours as needed. Patient not taking: No sig reported 11/13/19   Heilingoetter, Cassandra L, PA-C  sucralfate (CARAFATE) 1 g tablet Crush and mix in 1 oz water and drink 5 min TID before meals and at HS for radiation induced esophagitis Patient not taking: No sig reported 12/19/19   Hayden Pedro, PA-C  XARELTO 20 MG TABS tablet Take 20 mg by mouth daily in the afternoon.  09/08/18   [provider]     Family History  Problem Relation Age of Onset  . Dementia Mother   . Alzheimer's disease Mother   .  Hypertension Mother   . Stroke Father   . Clotting disorder Father   . Esophageal cancer Neg Hx   . Stomach cancer Neg Hx   . Rectal cancer Neg Hx   . Colon polyps Neg Hx     Social History   Socioeconomic History  . Marital status: Married    Spouse name: Not on file  . Number of children: 2  . Years of education: Not on file  . Highest education level: Not on file  Occupational History  . Occupation: retired  Tobacco Use  . Smoking status: Former Smoker    Packs/day: 0.50    Years: 45.00    Pack years: 22.50    Types: Cigarettes  . Smokeless tobacco: Never Used  Vaping Use  . Vaping Use: Never used  Substance and Sexual Activity  . Alcohol use: Yes    Alcohol/week: 50.0 standard drinks    Types: 50 Cans of beer per week  . Drug use: No  . Sexual activity: Not on file  Other Topics Concern  . Not on file  Social History Narrative  . Not on file   Social Determinants of Health   Financial Resource Strain: Not on file  Food Insecurity: Not on file  Transportation Needs: Not on file  Physical Activity: Not on file  Stress: Not on file  Social Connections: Not on file    Review of Systems: A 12 point ROS discussed and pertinent positives are indicated in the HPI above.  All other systems are negative.  Review of Systems  Constitutional: Negative for appetite change and fatigue.  HENT: Positive for dental problem.        Many missing teeth; hasn't been to a dentist in a long time  Respiratory: Negative for cough and shortness of breath.   Cardiovascular: Negative for chest pain and leg swelling.  Gastrointestinal: Negative for abdominal pain, diarrhea, nausea and vomiting.  Neurological: Negative for dizziness and headaches.    Vital Signs: BP (!) 143/86   Pulse 61   Temp (!) 97.2 F (36.2 C) (Oral)   Resp 18   SpO2 100%   Physical Exam Constitutional:      General: He is not in acute distress. HENT:     Mouth/Throat:     Mouth: Mucous membranes  are moist.     Pharynx: Oropharynx is clear.     Comments: Poor dental hygiene; missing teeth Cardiovascular:     Rate and Rhythm: Normal rate and regular rhythm.     Pulses: Normal pulses.     Heart sounds: Normal heart sounds.  Pulmonary:     Effort: Pulmonary effort is normal.     Breath sounds: Normal breath sounds.  Abdominal:     General: Bowel sounds are normal.     Palpations: Abdomen is soft.     Tenderness: There is no abdominal tenderness.  Musculoskeletal:        General: Normal range of motion.     Cervical back: Normal range of motion.  Skin:    General: Skin is warm and dry.  Neurological:     Mental Status: He is alert and oriented to person, place, and time.     Imaging:  CT Chest W Contrast  Result Date: 08/10/2020 CLINICAL DATA:  Restaging non-small cell lung cancer. History of chemotherapy and radiation therapy. EXAM: CT CHEST WITH CONTRAST TECHNIQUE: Multidetector CT imaging of the chest was performed during intravenous contrast administration. CONTRAST:  14mL OMNIPAQUE IOHEXOL 300 MG/ML  SOLN COMPARISON:  PET-CT 11/12/2019 and chest CTs 02/05/2020 and 05/19/2020 FINDINGS: Cardiovascular: CP the heart is normal in size. No pericardial effusion. Mild tortuosity, ectasia and atherosclerotic calcification involving the thoracic aorta. No dissection or focal aneurysm. Stable coronary artery calcifications. Mediastinum/Nodes: Small scattered mediastinal and hilar lymph nodes are stable. No findings for new adenopathy. The esophagus is grossly normal. Thyroid gland is unremarkable. Lungs/Pleura: Extensive radiation changes involving the right paramediastinal lung and right hilum. I do not see any findings suspicious for recurrent tumor. Stable calcified granuloma the right lung base. A few tiny scattered left lower lobe pulmonary nodules likely inflammatory given their appearance. No findings suspicious for pulmonary metastatic disease. Upper Abdomen: Enlarging peripherally  enhancing mass in the left hepatic lobe in segment 4A highly suspicious for metastatic disease. This measures approximately 4.5 x 4.0 cm. A few other smaller low-attenuation lesions are suspected. MRI liver or PET-CT may be helpful for further evaluation. No findings suspicious for adrenal gland metastasis. No upper abdominal lymphadenopathy. Musculoskeletal: No significant bony findings. No evidence of osseous metastatic disease. IMPRESSION: 1. Extensive radiation changes involving the right paramediastinal lung and right hilum but no findings suspicious for recurrent tumor. 2. Peripherally enhancing mass in the left hepatic lobe in segment 4A highly suspicious for metastatic disease. A few other smaller low-attenuation lesions are suspected. MRI liver or PET-CT may be helpful for further evaluation. 3. No findings suspicious for pulmonary metastatic disease. 4. Stable atherosclerotic calcifications involving the thoracic aorta and coronary arteries. 5. Emphysema and aortic atherosclerosis. Aortic Atherosclerosis (ICD10-I70.0) and Emphysema (ICD10-J43.9). Electronically Signed   By: Marijo Sanes M.D.   On: 08/10/2020 17:09    Labs:  CBC: Recent Labs    06/17/20 1116 07/15/20 0930 08/12/20 0942 08/18/20 1130  WBC 4.4 4.1 4.1 6.3  HGB 14.6 14.1 14.2 14.2  HCT 40.3 39.7 39.5 40.3  PLT 184 196 199 209    COAGS: Recent Labs    10/30/19 0558 08/18/20 1130  INR 1.1 1.1  APTT 31  --     BMP: Recent Labs    01/07/20 1106 01/16/20 1348 02/05/20 1139 02/19/20 1403 03/24/20 1116 06/17/20 1116 07/15/20 0930 08/12/20 0942 08/18/20 1130  NA 129* 125* 130* 128*   < > 130* 130* 129* 127*  K 4.3 4.5 4.6 4.6   < > 4.6 4.4 4.5 4.2  CL 99 96* 100 101   < > 99 100 99 97*  CO2 22 19* 23 21*   < > 21* 21* 20* 22  GLUCOSE 92 101* 86 86   < > 100* 77 82 84  BUN 8 11 8  7*   < > 6* 5* 6* 6*  CALCIUM 9.6 9.3 9.5 8.8*   < > 9.4 8.9 9.0 8.9  CREATININE 0.72 0.77 0.74 0.71   < > 0.73 0.71 0.70 0.68   GFRNONAA >60 >60 >60 >60   < > >60 >60 >60 >60  GFRAA >60 >60 >60 >60  --   --   --   --   --    < > = values in this interval not displayed.    LIVER FUNCTION TESTS: Recent Labs    06/17/20 1116 07/15/20 0930 08/12/20 0942 08/18/20  1130  BILITOT 0.8 0.6 0.4 0.7  AST 26 21 21 21   ALT 11 9 8 11   ALKPHOS 43 45 47 43  PROT 7.5 7.4 7.3 7.2  ALBUMIN 3.8 3.8 3.8 3.8    TUMOR MARKERS: No results for input(s): AFPTM, CEA, CA199, CHROMGRNA in the last 8760 hours.  Assessment and Plan:  Lung cancer with possible liver metastasis: Ashby Dawes. Korte, 70 year old male, presents today to the [facility] department for an image-guided liver lesion biopsy.  Risks and benefits of this procedure were discussed with the patient and/or patient's family including, but not limited to bleeding, infection, damage to adjacent structures or low yield requiring additional tests.  All of the questions were answered and there is agreement to proceed. He has been NPO. Labs and vitals have been reviewed. Last dose of Xarelto was 08/17/20  Consent signed and in chart.  Thank you for this interesting consult.  I greatly enjoyed meeting MACGREGOR AESCHLIMAN and look forward to participating in their care.  A copy of this report was sent to the requesting provider on this date.  Electronically Signed: Soyla Dryer, AGACNP-BC [phone] 08/18/2020, 12:23 PM   I spent a total of  30 Minutes   in face to face in clinical consultation, greater than 50% of which was counseling/coordinating care for liver lesion biopsy.

## 2020-08-18 NOTE — Progress Notes (Signed)
Patient on schedule at [facility] for liver contrast/biopsy with Dr Serafina Royals on 3/16, called and spoke with Melinda/wife on phone. Made aware to be here @ 0830, NPO after MN prior to procedure and a driver post procedure/recovery/holding xarelto still since unable to have biopsy at [facility] earlier today.stated understanding.

## 2020-08-18 NOTE — Sedation Documentation (Signed)
Unable to see lesion for bx

## 2020-08-19 ENCOUNTER — Other Ambulatory Visit (HOSPITAL_COMMUNITY): Payer: Medicare Other

## 2020-08-19 ENCOUNTER — Other Ambulatory Visit: Payer: Self-pay | Admitting: Student

## 2020-08-20 ENCOUNTER — Other Ambulatory Visit: Payer: Self-pay

## 2020-08-20 ENCOUNTER — Ambulatory Visit
Admission: RE | Admit: 2020-08-20 | Discharge: 2020-08-20 | Disposition: A | Discharge status: Home / Self Care | Payer: Medicare Other | Source: Ambulatory Visit | Attending: Internal Medicine | Admitting: Internal Medicine

## 2020-08-20 DIAGNOSIS — Z9221 Personal history of antineoplastic chemotherapy: Secondary | ICD-10-CM | POA: Insufficient documentation

## 2020-08-20 DIAGNOSIS — R160 Hepatomegaly, not elsewhere classified: Secondary | ICD-10-CM | POA: Insufficient documentation

## 2020-08-20 DIAGNOSIS — Z87891 Personal history of nicotine dependence: Secondary | ICD-10-CM | POA: Insufficient documentation

## 2020-08-20 DIAGNOSIS — Z923 Personal history of irradiation: Secondary | ICD-10-CM | POA: Insufficient documentation

## 2020-08-20 DIAGNOSIS — K7689 Other specified diseases of liver: Secondary | ICD-10-CM | POA: Diagnosis not present

## 2020-08-20 DIAGNOSIS — R9389 Abnormal findings on diagnostic imaging of other specified body structures: Secondary | ICD-10-CM

## 2020-08-20 DIAGNOSIS — C3491 Malignant neoplasm of unspecified part of right bronchus or lung: Secondary | ICD-10-CM | POA: Insufficient documentation

## 2020-08-20 DIAGNOSIS — D376 Neoplasm of uncertain behavior of liver, gallbladder and bile ducts: Secondary | ICD-10-CM | POA: Diagnosis not present

## 2020-08-20 DIAGNOSIS — C3490 Malignant neoplasm of unspecified part of unspecified bronchus or lung: Secondary | ICD-10-CM | POA: Diagnosis not present

## 2020-08-20 IMAGING — US US BIOPSY CORE LIVER
1 series · 13 of 25 positions shown · non-contrast
Comparison: none

INDICATION: 70-year-old male with history of non-small cell lung cancer and
indeterminate liver mass noted on chest CT from [DATE].
Sonographically occult lesion at time of previously scheduled
ultrasound-guided liver mass biopsy on [DATE]. Presents today
for contrast enhanced ultrasound guided liver mass biopsy.

[Series 1: us liver w/cm 1st lesion · 56 acquisitions, 13 frames shown]
[im 1/56]
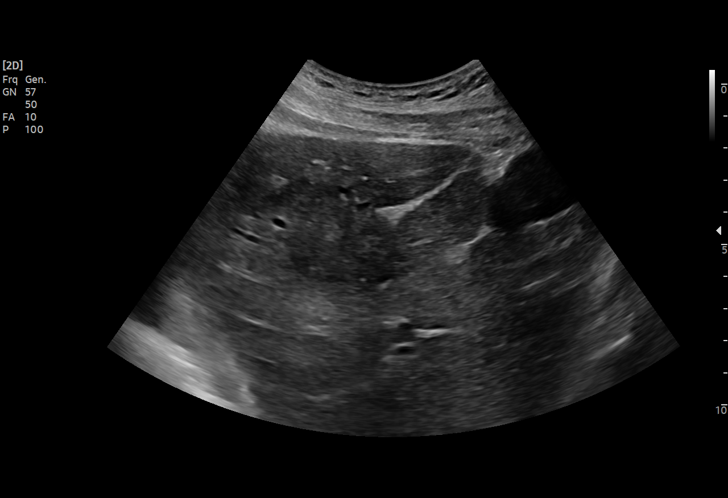
[im 5/56]
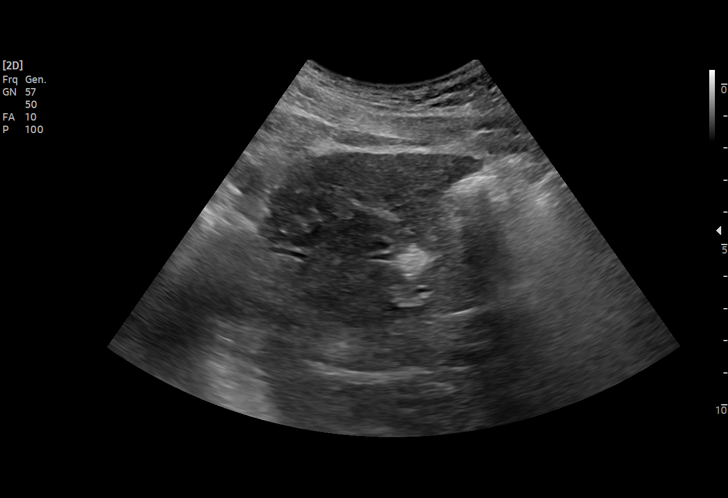
[im 10/56]
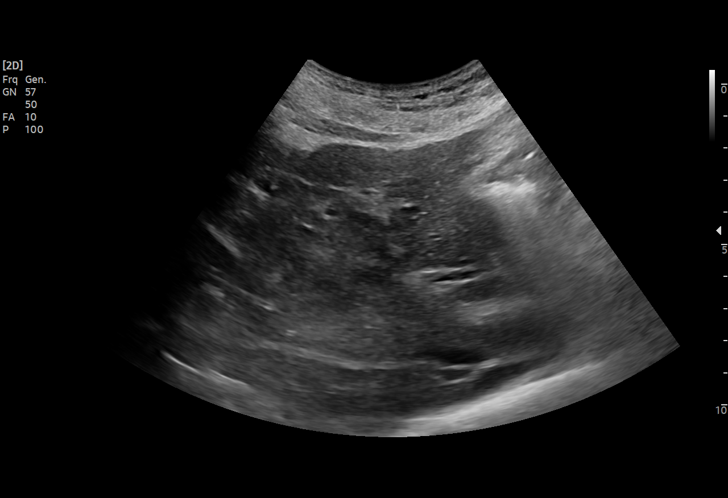
[im 14/56]
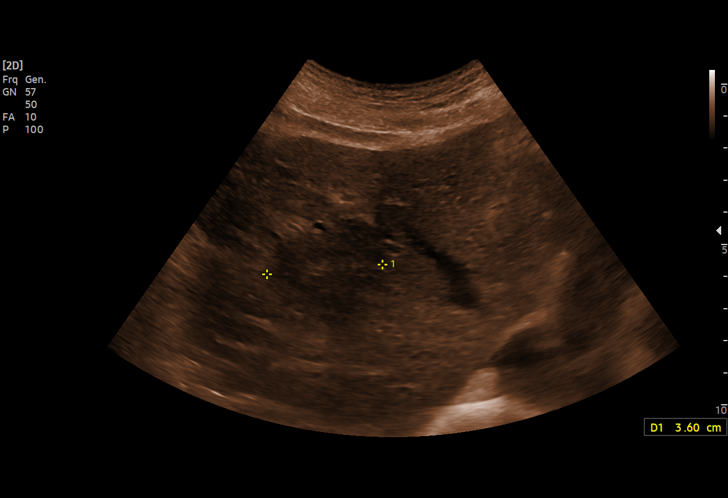
[im 19/56]
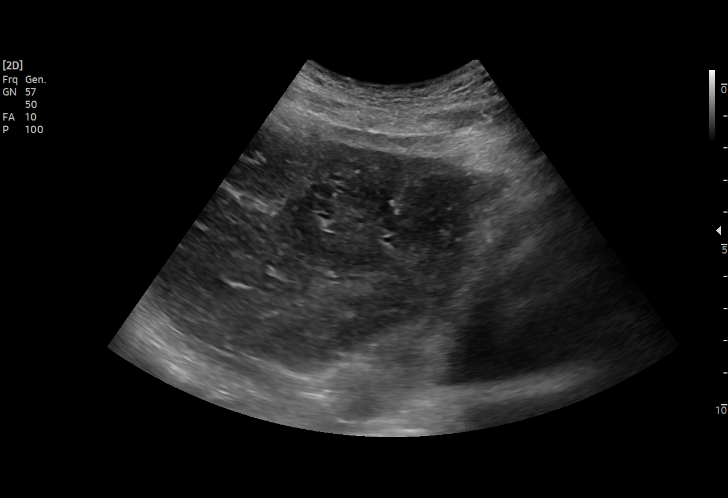
[im 23/56]
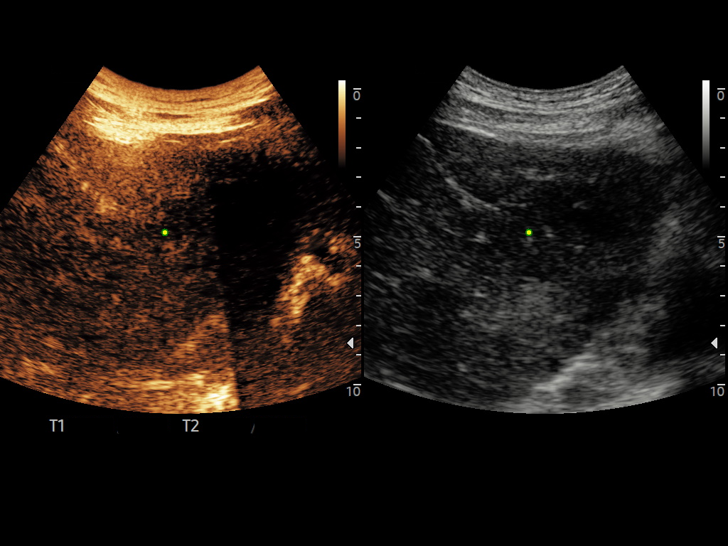
[im 28/56]
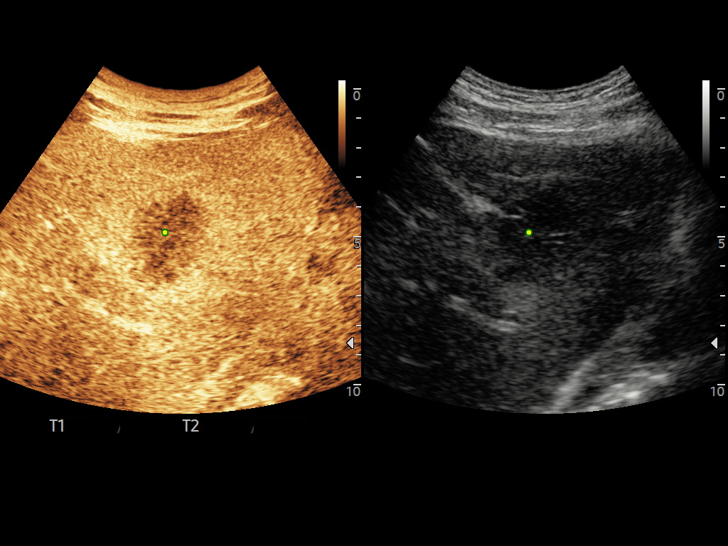
[im 33/56]
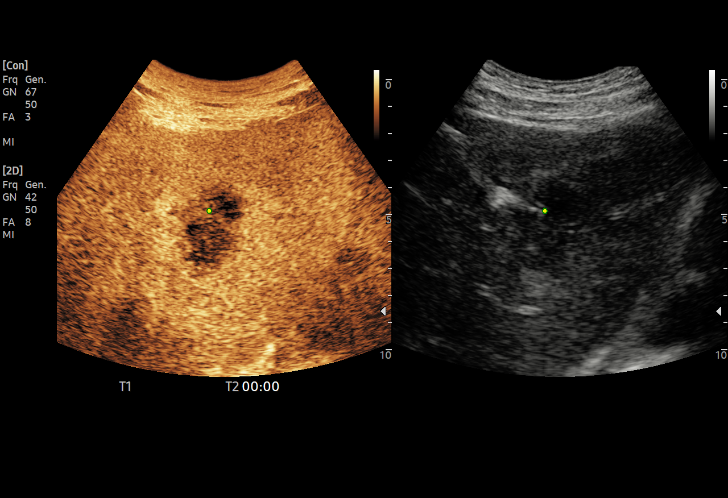
[im 37/56]
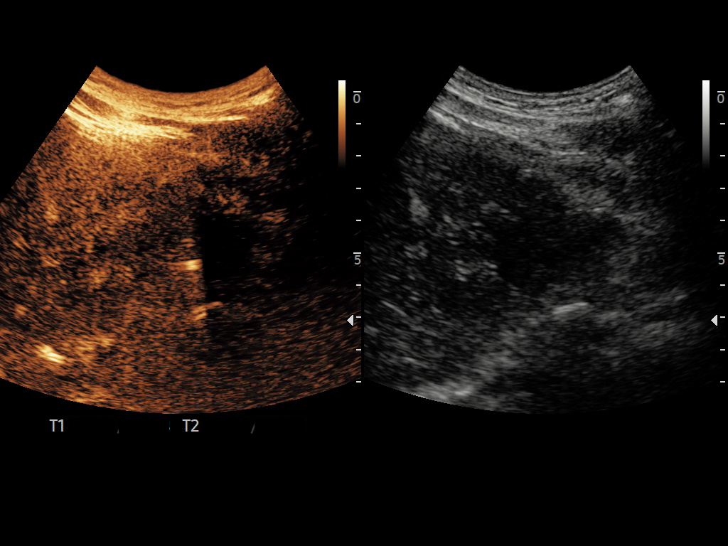
[im 42/56]
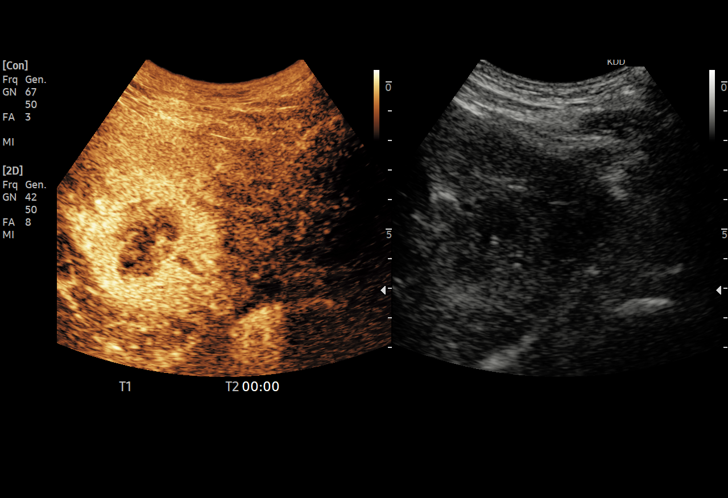
[im 46/56]
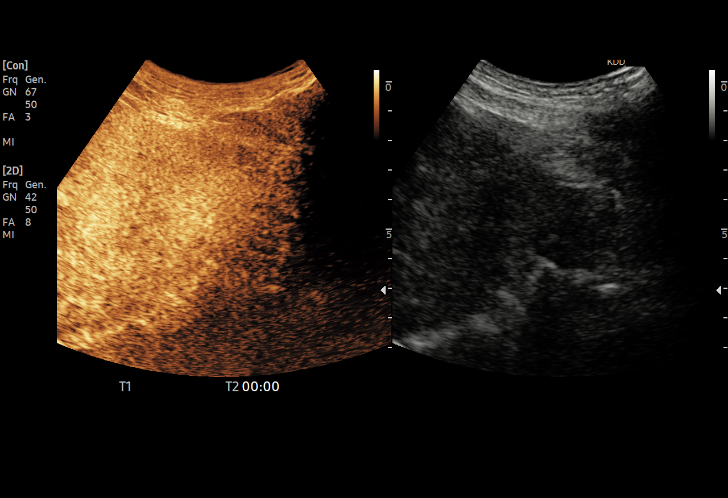
[im 51/56]
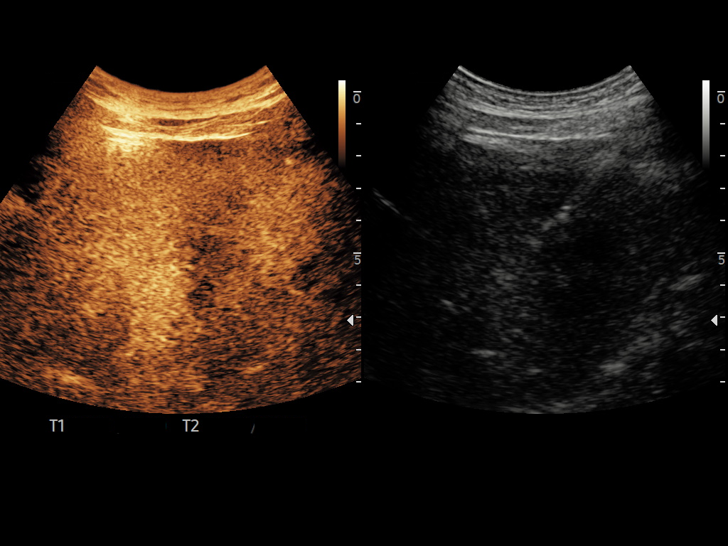
[im 56/56]
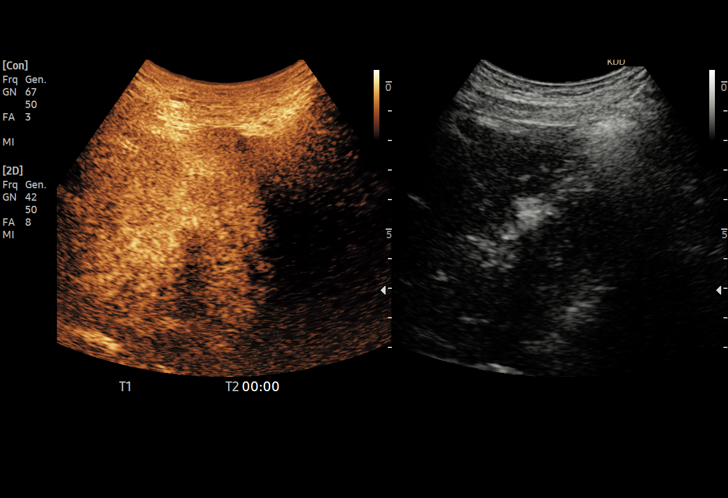

[13 of 25 positions shown; findings below may reference images not displayed]

EXAM:
ULTRASOUND BIOPSY CORE LIVER; US LIVER WITH  CONTRAST 1ST LESION

MEDICATIONS:
5 mL Lumason, intravenous, 2 injections total

ANESTHESIA/SEDATION:
Moderate (conscious) sedation was employed during this procedure. A
total of Versed 2 mg and Fentanyl 100 mcg was administered
intravenously.

Moderate Sedation Time: 14 minutes. The patient's level of
consciousness and vital signs were monitored continuously by
radiology nursing throughout the procedure under my direct
supervision.

COMPLICATIONS:
None immediate.

PROCEDURE:
Informed written consent was obtained from the patient after a
thorough discussion of the procedural risks, benefits and
alternatives. All questions were addressed. Maximal Sterile Barrier
Technique was utilized including caps, mask, sterile gowns, sterile
gloves, sterile drape, hand hygiene and skin antiseptic. A timeout
was performed prior to the initiation of the procedure.

Preprocedure ultrasound demonstrated a subtle, mildly hypoechoic
mass in segment 4 compatible with findings on CT. Contrast enhanced
ultrasound was utilized to further characterize and identify the
mass:

Observation 1

Lobe: Left

Size: 3.0 x 3.3 x 3.6 cm

Arterial phase enhancement features: Rim with core of
non-enhancement

Onset of washout: 120 seconds

Degree of washout: Mild

Tumor in vein: No

SAFIYA category: Not applicable.

The right upper quadrant was prepped and draped in standard fashion.
Local anesthesia was administered subdermally at the planned entry
site as well as under ultrasound guidance along the hepatic capsule.
A skin nick was made. A 17 gauge introducer needle was advanced to
the hepatic parenchyma under ultrasound guidance.

An additional dose of ultrasound contrast was administered for
precise needle placement. The 17 gauge introducer needle was
directed into the superior aspect of the mass to a region of
hyperenhancement.

Next, a total of 3, 18 gauge core biopsies were obtained. The
samples were placed in formalin and sent to Pathology. Under
ultrasound guidance, a Gel-Foam slurry was administered along the
needle entry tract as the introducer needle was withdrawn.
Postprocedure ultrasound demonstrated no evidence of perihepatic
fluid collection. The patient tolerated the procedure well.
IMPRESSION: 1. Contrast enhanced ultrasound of hepatic segment 4 demonstrates a
3.6 cm mass with enhancement features compatible with metastasis.

2. Technically successful contrast enhanced ultrasound-guided
nonfocal core liver biopsy from mass in hepatic segment IV.

## 2020-08-20 MED ORDER — FENTANYL CITRATE (PF) 100 MCG/2ML IJ SOLN
INTRAMUSCULAR | Status: AC | PRN
  Administered 2020-08-20 (×2): 50 ug via INTRAVENOUS

## 2020-08-20 MED ORDER — SULFUR HEXAFLUORIDE MICROSPH 60.7-25 MG IV SUSR
5.0000 mL | Freq: Once | INTRAVENOUS | Status: AC | PRN
  Administered 2020-08-20: 5 mL via INTRAVENOUS

## 2020-08-20 MED ORDER — MIDAZOLAM HCL 2 MG/2ML IJ SOLN
INTRAMUSCULAR | Status: AC | PRN
  Administered 2020-08-20 (×2): 1 mg via INTRAVENOUS

## 2020-08-20 MED ORDER — MIDAZOLAM HCL 2 MG/2ML IJ SOLN
INTRAMUSCULAR | Status: AC
  Filled 2020-08-20: qty 2

## 2020-08-20 MED ORDER — FENTANYL CITRATE (PF) 100 MCG/2ML IJ SOLN
INTRAMUSCULAR | Status: AC
  Filled 2020-08-20: qty 2

## 2020-08-20 MED ORDER — SODIUM CHLORIDE 0.9 % IV SOLN: INTRAVENOUS | Status: DC

## 2020-08-20 NOTE — Discharge Instructions (Signed)
Moderate Conscious Sedation, Adult, Care After This sheet gives you information about how to care for yourself after your procedure. Your health care provider may also give you more specific instructions. If you have problems or questions, contact your health care provider. What can I expect after the procedure? After the procedure, it is common to have:  Sleepiness for several hours.  Impaired judgment for several hours.  Difficulty with balance.  Vomiting if you eat too soon. Follow these instructions at home: For the time period you were told by your health care provider:  Rest.  Do not participate in activities where you could fall or become injured.  Do not drive or use machinery.  Do not drink alcohol.  Do not take sleeping pills or medicines that cause drowsiness.  Do not make important decisions or sign legal documents.  Do not take care of children on your own.      Eating and drinking  Follow the diet recommended by your health care provider.  Drink enough fluid to keep your urine pale yellow.  If you vomit: ? Drink water, juice, or soup when you can drink without vomiting. ? Make sure you have little or no nausea before eating solid foods.   General instructions  Take over-the-counter and prescription medicines only as told by your health care provider.  Have a responsible adult stay with you for the time you are told. It is important to have someone help care for you until you are awake and alert.  Do not smoke.  Keep all follow-up visits as told by your health care provider. This is important. Contact a health care provider if:  You are still sleepy or having trouble with balance after 24 hours.  You feel light-headed.  You keep feeling nauseous or you keep vomiting.  You develop a rash.  You have a fever.  You have redness or swelling around the IV site. Get help right away if:  You have trouble breathing.  You have new-onset confusion at  home. Summary  After the procedure, it is common to feel sleepy, have impaired judgment, or feel nauseous if you eat too soon.  Rest after you get home. Know the things you should not do after the procedure.  Follow the diet recommended by your health care provider and drink enough fluid to keep your urine pale yellow.  Get help right away if you have trouble breathing or new-onset confusion at home. This information is not intended to replace advice given to you by your health care provider. Make sure you discuss any questions you have with your health care provider. Document Revised: 09/21/2019 Document Reviewed: 04/19/2019 Elsevier Patient Education  2021 [employer]. Liver Biopsy, Care After These instructions give you information on caring for yourself after your procedure. Your doctor may also give you more specific instructions. Call your doctor if you have any problems or questions after your procedure. What can I expect after the procedure? After the procedure, it is common to have:  Pain and soreness where the biopsy was done.  Bruising around the area where the biopsy was done.  Sleepiness and be tired for a few days. Follow these instructions at home: Medicines  Take over-the-counter and prescription medicines only as told by your doctor.  If you were prescribed an antibiotic medicine, take it as told by your doctor. Do not stop taking the antibiotic even if you start to feel better.  Do not take medicines such as aspirin and ibuprofen.  These medicines can thin your blood. Do not take these medicines unless your doctor tells you to take them.  If you are taking prescription pain medicine, take actions to prevent or treat constipation. Your doctor may recommend that you: ? Drink enough fluid to keep your pee (urine) clear or pale yellow. ? Take over-the-counter or prescription medicines. ? Eat foods that are high in fiber, such as fresh fruits and vegetables, whole  grains, and beans. ? Limit foods that are high in fat and processed sugars, such as fried and sweet foods. Caring for your cut  Follow instructions from your doctor about how to take care of your cuts from surgery (incisions). Make sure you: ? Wash your hands with soap and water before you change your bandage (dressing). If you cannot use soap and water, use hand sanitizer. ? Change your bandage as told by your doctor. ? Leave stitches (sutures), skin glue, or skin tape (adhesive) strips in place. They may need to stay in place for 2 weeks or longer. If tape strips get loose and curl up, you may trim the loose edges. Do not remove tape strips completely unless your doctor says it is okay.  Check your cuts every day for signs of infection. Check for: ? Redness, swelling, or more pain. ? Fluid or blood. ? Pus or a bad smell. ? Warmth.  Do not take baths, swim, or use a hot tub until your doctor says it is okay to do so. Activity  Rest at home for 1-2 days or as told by your doctor. ? Avoid sitting for a long time without moving. Get up to take short walks every 1-2 hours.  Return to your normal activities as told by your doctor. Ask what activities are safe for you.  Do not do these things in the first 24 hours: ? Drive. ? Use machinery. ? Take a bath or shower.  Do not lift more than 10 pounds (4.5 kg) or play contact sports for the first 2 weeks.   General instructions  Do not drink alcohol in the first week after the procedure.  Have someone stay with you for at least 24 hours after the procedure.  Get your test results. Ask your doctor or the department that is doing the test: ? When will my results be ready? ? How will I get my results? ? What are my treatment options? ? What other tests do I need? ? What are my next steps?  Keep all follow-up visits as told by your doctor. This is important.   Contact a doctor if:  A cut bleeds and leaves more than just a small spot  of blood.  A cut is red, puffs up (swells), or hurts more than before.  Fluid or something else comes from a cut.  A cut smells bad.  You have a fever or chills. Get help right away if:  You have swelling, bloating, or pain in your belly (abdomen).  You get dizzy or faint.  You have a rash.  You feel sick to your stomach (nauseous) or throw up (vomit).  You have trouble breathing, feel short of breath, or feel faint.  Your chest hurts.  You have problems talking or seeing.  You have trouble with your balance or moving your arms or legs. Summary  After the procedure, it is common to have pain, soreness, bruising, and tiredness.  Your doctor will tell you how to take care of yourself at home. Change your bandage,  take your medicines, and limit your activities as told by your doctor.  Call your doctor if you have symptoms of infection. Get help right away if your belly swells, your cut bleeds a lot, or you have trouble talking or breathing. This information is not intended to replace advice given to you by your health care provider. Make sure you discuss any questions you have with your health care provider. Document Revised: 06/02/2017 Document Reviewed: 06/03/2017 Elsevier Patient Education  2021 [employer].

## 2020-08-20 NOTE — Progress Notes (Signed)
Patient clinically stable post Liver Contrast/Biopsy per Dr Serafina Royals, tolerated well. Vitals stable pre and post procedure. Denies complaints at this time. Received Versed 2 mg along with Fentanyl 100 mcg IV for procedure. Awake/alert and oriented post procedure. Report given to Fransico Michael Rn post procedure/recovery.

## 2020-08-20 NOTE — Procedures (Signed)
Interventional Radiology Procedure Note  Procedure: Contrast enhanced ultrasound guided liver mass biopsy  Findings: Please refer to procedural dictation for full description. 18 ga core biopsy x3 of peripheral, hypervascular aspect of segment IV mass.  Needle track gelfoam embolization.  Complications: None immediate  Estimated Blood Loss: < 5 mL  Recommendations: Strict bedrest for 3 hours. Follow up Pathology results.   Ruthann Cancer, MD Pager: [phone]

## 2020-08-20 NOTE — H&P (Signed)
Chief Complaint: Patient was seen in consultation today for liver mass biopsy  Referring Physician(s): [name],[name]  Patient Status: [facility] - Out-pt  History of Present Illness: Christopher Harding is a 70 y.o. male with history of recently diagnosed non-small cell lung cancer and right upper quadrant pain found to have an enhancing mass in segment IV.  He presented to [facility] earlier this week for ultrasound guided liver mass biopsy, however the mass was sonographically occult and therefore no biopsy was performed.  He presents today to [facility] for contrast enhanced ultrasound guided liver mass biopsy.  No change in health since visit on 08/18/20.  Past Medical History:  Diagnosis Date  . A-fib [facility])    s/p DCCV 12/07/18  . Cancer ([place])   . Depression    situational  . Dysrhythmia   . Hypertension     Past Surgical History:  Procedure Laterality Date  . CARDIOVERSION N/A 12/07/2018   Procedure: CARDIOVERSION;  Surgeon: Josue Hector, MD;  Location: [facility] ENDOSCOPY;  Service: Cardiovascular;  Laterality: N/A;  . FINE NEEDLE ASPIRATION  10/30/2019   Procedure: FINE NEEDLE ASPIRATION (FNA) LINEAR;  Surgeon: Candee Furbish, MD;  Location: [facility] ENDOSCOPY;  Service: Pulmonary;;  . INGUINAL HERNIA REPAIR Right 01/25/2019   Procedure: RIGHT INGUINAL HERNIA REPAIR WITH MESH;  Surgeon: Coralie Keens, MD;  Location: [place];  Service: General;  Laterality: Right;  TAP BLOCK  . INSERTION OF MESH Right 01/25/2019   Procedure: Insertion Of Mesh;  Surgeon: Coralie Keens, MD;  Location: [place];  Service: General;  Laterality: Right;  Marland Kitchen VIDEO BRONCHOSCOPY WITH ENDOBRONCHIAL ULTRASOUND N/A 10/30/2019   Procedure: VIDEO BRONCHOSCOPY WITH ENDOBRONCHIAL ULTRASOUND;  Surgeon: Candee Furbish, MD;  Location: [facility] ENDOSCOPY;  Service: Pulmonary;  Laterality: N/A;    Allergies: Patient has no known allergies.  Medications: Prior to Admission medications   Medication Sig Start Date End Date Taking?  Authorizing Provider  metoprolol tartrate (LOPRESSOR) 50 MG tablet Take 50 mg by mouth 2 (two) times daily with a meal. 09/14/18  Yes [provider]  ibuprofen (ADVIL) 200 MG tablet Take 400 mg by mouth every 8 (eight) hours as needed (for pain).  Patient not taking: No sig reported    [provider]  Omeprazole (PRILOSEC PO) Take 20 mg by mouth.    [provider]  prochlorperazine (COMPAZINE) 10 MG tablet Take 1 tablet (10 mg total) by mouth every 6 (six) hours as needed. Patient not taking: No sig reported 11/13/19   Heilingoetter, Cassandra L, PA-C  sucralfate (CARAFATE) 1 g tablet Crush and mix in 1 oz water and drink 5 min TID before meals and at HS for radiation induced esophagitis Patient not taking: No sig reported 12/19/19   Hayden Pedro, PA-C  XARELTO 20 MG TABS tablet Take 20 mg by mouth daily in the afternoon.  09/08/18   [provider]     Family History  Problem Relation Age of Onset  . Dementia Mother   . Alzheimer's disease Mother   . Hypertension Mother   . Stroke Father   . Clotting disorder Father   . Esophageal cancer Neg Hx   . Stomach cancer Neg Hx   . Rectal cancer Neg Hx   . Colon polyps Neg Hx     Social History   Socioeconomic History  . Marital status: Married    Spouse name: Not on file  . Number of children: 2  . Years of education: Not on  file  . Highest education level: Not on file  Occupational History  . Occupation: retired  Tobacco Use  . Smoking status: Former Smoker    Packs/day: 0.50    Years: 45.00    Pack years: 22.50    Types: Cigarettes  . Smokeless tobacco: Never Used  Vaping Use  . Vaping Use: Never used  Substance and Sexual Activity  . Alcohol use: Yes    Alcohol/week: 50.0 standard drinks    Types: 50 Cans of beer per week  . Drug use: No  . Sexual activity: Not on file  Other Topics Concern  . Not on file  Social History Narrative  . Not on file   Social Determinants of  Health   Financial Resource Strain: Not on file  Food Insecurity: Not on file  Transportation Needs: Not on file  Physical Activity: Not on file  Stress: Not on file  Social Connections: Not on file    Review of Systems: A 12 point ROS discussed and pertinent positives are indicated in the HPI above.  All other systems are negative.  Vital Signs: BP 129/70   Pulse 61   Temp 98 F (36.7 C) (Oral)   Resp 18   Ht 6\' 2"  (1.88 m)   Wt 74.8 kg   SpO2 100%   BMI 21.18 kg/m   Physical Exam Constitutional:      Appearance: Normal appearance.  HENT:     Head: Normocephalic.     Mouth/Throat:     Mouth: Mucous membranes are moist.     Comments: MP2  Cardiovascular:     Rate and Rhythm: Normal rate and regular rhythm.     Heart sounds: Normal heart sounds.  Pulmonary:     Effort: Pulmonary effort is normal. No respiratory distress.     Breath sounds: Normal breath sounds.  Abdominal:     General: There is no distension.  Musculoskeletal:        General: No swelling.  Skin:    General: Skin is warm and dry.  Neurological:     Mental Status: He is alert and oriented to person, place, and time.     Imaging: CT Chest W Contrast  Result Date: 08/10/2020 CLINICAL DATA:  Restaging non-small cell lung cancer. History of chemotherapy and radiation therapy. EXAM: CT CHEST WITH CONTRAST TECHNIQUE: Multidetector CT imaging of the chest was performed during intravenous contrast administration. CONTRAST:  74mL OMNIPAQUE IOHEXOL 300 MG/ML  SOLN COMPARISON:  PET-CT 11/12/2019 and chest CTs 02/05/2020 and 05/19/2020 FINDINGS: Cardiovascular: CP the heart is normal in size. No pericardial effusion. Mild tortuosity, ectasia and atherosclerotic calcification involving the thoracic aorta. No dissection or focal aneurysm. Stable coronary artery calcifications. Mediastinum/Nodes: Small scattered mediastinal and hilar lymph nodes are stable. No findings for new adenopathy. The esophagus is grossly  normal. Thyroid gland is unremarkable. Lungs/Pleura: Extensive radiation changes involving the right paramediastinal lung and right hilum. I do not see any findings suspicious for recurrent tumor. Stable calcified granuloma the right lung base. A few tiny scattered left lower lobe pulmonary nodules likely inflammatory given their appearance. No findings suspicious for pulmonary metastatic disease. Upper Abdomen: Enlarging peripherally enhancing mass in the left hepatic lobe in segment 4A highly suspicious for metastatic disease. This measures approximately 4.5 x 4.0 cm. A few other smaller low-attenuation lesions are suspected. MRI liver or PET-CT may be helpful for further evaluation. No findings suspicious for adrenal gland metastasis. No upper abdominal lymphadenopathy. Musculoskeletal: No significant bony findings. No  evidence of osseous metastatic disease. IMPRESSION: 1. Extensive radiation changes involving the right paramediastinal lung and right hilum but no findings suspicious for recurrent tumor. 2. Peripherally enhancing mass in the left hepatic lobe in segment 4A highly suspicious for metastatic disease. A few other smaller low-attenuation lesions are suspected. MRI liver or PET-CT may be helpful for further evaluation. 3. No findings suspicious for pulmonary metastatic disease. 4. Stable atherosclerotic calcifications involving the thoracic aorta and coronary arteries. 5. Emphysema and aortic atherosclerosis. Aortic Atherosclerosis (ICD10-I70.0) and Emphysema (ICD10-J43.9). Electronically Signed   By: Marijo Sanes M.D.   On: 08/10/2020 17:09   US Abdomen Limited RUQ (LIVER/GB)  Result Date: 08/18/2020 CLINICAL DATA:  70 year old male with non-small cell lung cancer in incidentally noted segment 4 a peripherally enhancing mass on recent chest CT. Presents today for ultrasound-guided liver mass biopsy. EXAM: ULTRASOUND ABDOMEN LIMITED RIGHT UPPER QUADRANT COMPARISON:  08/08/2020 FINDINGS: No focal  lesion identified. Within normal limits in parenchymal echogenicity and echotexture. IMPRESSION: The previously identified segment 4A mass is completely sonographically occult. No biopsy was performed. PLAN: The patient is rescheduled for contrast enhanced ultrasound guided liver mass biopsy later this week. Ruthann Cancer, MD [facility] Electronically Signed   By: Ruthann Cancer MD   On: 08/18/2020 14:11    Labs:  CBC: Recent Labs    06/17/20 1116 07/15/20 0930 08/12/20 0942 08/18/20 1130  WBC 4.4 4.1 4.1 6.3  HGB 14.6 14.1 14.2 14.2  HCT 40.3 39.7 39.5 40.3  PLT 184 196 199 209    COAGS: Recent Labs    10/30/19 0558 08/18/20 1130  INR 1.1 1.1  APTT 31  --     BMP: Recent Labs    01/07/20 1106 01/16/20 1348 02/05/20 1139 02/19/20 1403 03/24/20 1116 06/17/20 1116 07/15/20 0930 08/12/20 0942 08/18/20 1130  NA 129* 125* 130* 128*   < > 130* 130* 129* 127*  K 4.3 4.5 4.6 4.6   < > 4.6 4.4 4.5 4.2  CL 99 96* 100 101   < > 99 100 99 97*  CO2 22 19* 23 21*   < > 21* 21* 20* 22  GLUCOSE 92 101* 86 86   < > 100* 77 82 84  BUN 8 11 8  7*   < > 6* 5* 6* 6*  CALCIUM 9.6 9.3 9.5 8.8*   < > 9.4 8.9 9.0 8.9  CREATININE 0.72 0.77 0.74 0.71   < > 0.73 0.71 0.70 0.68  GFRNONAA >60 >60 >60 >60   < > >60 >60 >60 >60  GFRAA >60 >60 >60 >60  --   --   --   --   --    < > = values in this interval not displayed.    LIVER FUNCTION TESTS: Recent Labs    06/17/20 1116 07/15/20 0930 08/12/20 0942 08/18/20 1130  BILITOT 0.8 0.6 0.4 0.7  AST 26 21 21 21   ALT 11 9 8 11   ALKPHOS 43 45 47 43  PROT 7.5 7.4 7.3 7.2  ALBUMIN 3.8 3.8 3.8 3.8    TUMOR MARKERS: No results for input(s): AFPTM, CEA, CA199, CHROMGRNA in the last 8760 hours.  Assessment and Plan:  70 year old male with non-small cell lung cancer and indeterminate hepatic mass.  Plan for contrast enhanced ultrasound guided biopsy with moderate  sedation.   Electronically Signed: Suzette Battiest, MD 08/20/2020, 9:08 AM   I spent a total of  15 Minutes in  face to face in clinical consultation, greater than 50% of which was counseling/coordinating care for contrast enhanced liver mass biopsy.

## 2020-08-26 ENCOUNTER — Other Ambulatory Visit: Payer: Self-pay

## 2020-08-26 ENCOUNTER — Inpatient Hospital Stay (HOSPITAL_BASED_OUTPATIENT_CLINIC_OR_DEPARTMENT_OTHER): Payer: Medicare Other | Admitting: Internal Medicine

## 2020-08-26 VITALS — BP 131/82 | HR 62 | Temp 97.9°F | Resp 15 | Ht 74.0 in | Wt 158.4 lb

## 2020-08-26 DIAGNOSIS — I4891 Unspecified atrial fibrillation: Secondary | ICD-10-CM | POA: Diagnosis not present

## 2020-08-26 DIAGNOSIS — C3491 Malignant neoplasm of unspecified part of right bronchus or lung: Secondary | ICD-10-CM

## 2020-08-26 DIAGNOSIS — K769 Liver disease, unspecified: Secondary | ICD-10-CM | POA: Diagnosis not present

## 2020-08-26 DIAGNOSIS — Z923 Personal history of irradiation: Secondary | ICD-10-CM | POA: Diagnosis not present

## 2020-08-26 DIAGNOSIS — I10 Essential (primary) hypertension: Secondary | ICD-10-CM | POA: Diagnosis not present

## 2020-08-26 DIAGNOSIS — Z7901 Long term (current) use of anticoagulants: Secondary | ICD-10-CM | POA: Diagnosis not present

## 2020-08-26 DIAGNOSIS — C3411 Malignant neoplasm of upper lobe, right bronchus or lung: Secondary | ICD-10-CM | POA: Diagnosis not present

## 2020-08-26 DIAGNOSIS — Z9221 Personal history of antineoplastic chemotherapy: Secondary | ICD-10-CM | POA: Diagnosis not present

## 2020-08-26 DIAGNOSIS — Z79899 Other long term (current) drug therapy: Secondary | ICD-10-CM | POA: Diagnosis not present

## 2020-08-26 DIAGNOSIS — Z5112 Encounter for antineoplastic immunotherapy: Secondary | ICD-10-CM | POA: Diagnosis not present

## 2020-08-26 NOTE — Progress Notes (Signed)
[place] [phone]   Fax:([phone]) [place], MD [address] [place] [address]  DIAGNOSIS:  Metastatic non-small cell lung cancer initially diagnosed as stage IIIA/B (T3, N1/N2, M0)non-small cell lung cancer, adenocarcinoma. He presented with a right upper lobe lung mass with direct extension across the major fissure into the superior segment of the right lower lobe and ipsilateral hilar lymphadenopathy. There is questionable subcarinal lymphadenopathy versus esophageal hypermetabolism this was evaluated by gastroenterology with upper endoscopy on 01/30/2020 and there was no mass lesion or malignancy in that area. The patient was diagnosed in May 2021.  Biomarker Findings Microsatellite status - MS-Stable Tumor Mutational Burden - 9 Muts/Mb Genomic Findings For a complete list of the genes assayed, please refer to the Appendix. KRAS G12C CDKN2A/B p16INK4a S12* NKX2-1 amplification - equivocal? PMS2 R3Q TP53 P63fs*38 7 Disease relevant genes with no reportable alterations: ALK, BRAF, EGFR, ERBB2, MET, RET, ROS1  PDL1: 70%  PRIOR THERAPY: Weekly concurrent chemoradiation with carboplatin for an AUC of 2 and paclitaxel 45 mg/m. First dose expected on 11/26/2019. Status post 7 cycles.   Last dose was given 01/07/2020 with partial response.   CURRENT THERAPY:  Consolidation treatment with immunotherapy with Imfinzi 1500 mg IV every 4 weeks.  First dose 02/19/2020. Status post 6 cycles.  His treatment is currently on hold until the availability of the biopsy results  INTERVAL HISTORY: Christopher Harding 70 y.o. male returns to the clinic today for follow-up visit.  The patient is feeling fine today with no concerning complaints.  He underwent ultrasound-guided core biopsy of the suspicious liver lesion but the final pathology is still pending.  His treatment with immunotherapy has been on hold  until the biopsy results is available.  He denied having any current chest pain, shortness of breath, cough or hemoptysis.  He denied having any fever or chills.  He has no nausea, vomiting, diarrhea or constipation.  He has no headache or visual changes.  He is here today for evaluation and recommendation regarding his condition.  MEDICAL HISTORY: Past Medical History:  Diagnosis Date  . A-fib [facility])    s/p DCCV 12/07/18  . Cancer ([place])   . Depression    situational  . Dysrhythmia   . Hypertension     ALLERGIES:  has No Known Allergies.  MEDICATIONS:  Current Outpatient Medications  Medication Sig Dispense Refill  . ibuprofen (ADVIL) 200 MG tablet Take 400 mg by mouth every 8 (eight) hours as needed (for pain).  (Patient not taking: No sig reported)    . metoprolol tartrate (LOPRESSOR) 50 MG tablet Take 50 mg by mouth 2 (two) times daily with a meal.    . Omeprazole (PRILOSEC PO) Take 20 mg by mouth.    . prochlorperazine (COMPAZINE) 10 MG tablet Take 1 tablet (10 mg total) by mouth every 6 (six) hours as needed. (Patient not taking: No sig reported) 30 tablet 2  . sucralfate (CARAFATE) 1 g tablet Crush and mix in 1 oz water and drink 5 min TID before meals and at HS for radiation induced esophagitis (Patient not taking: No sig reported) 120 tablet 2  . XARELTO 20 MG TABS tablet Take 20 mg by mouth daily in the afternoon.      Current Facility-Administered Medications  Medication Dose Route Frequency Provider Last Rate Last Admin  . 0.9 %  sodium chloride infusion  500 mL Intravenous Continuous Beavers,  Cala Bradford, MD      . 0.9 %  sodium chloride infusion  500 mL Intravenous Once Tressia Danas, MD        SURGICAL HISTORY:  Past Surgical History:  Procedure Laterality Date  . CARDIOVERSION N/A 12/07/2018   Procedure: CARDIOVERSION;  Surgeon: Wendall Stade, MD;  Location: [facility] ENDOSCOPY;  Service: Cardiovascular;  Laterality: N/A;  . FINE NEEDLE ASPIRATION  10/30/2019   Procedure:  FINE NEEDLE ASPIRATION (FNA) LINEAR;  Surgeon: Lorin Glass, MD;  Location: [facility] ENDOSCOPY;  Service: Pulmonary;;  . INGUINAL HERNIA REPAIR Right 01/25/2019   Procedure: RIGHT INGUINAL HERNIA REPAIR WITH MESH;  Surgeon: Abigail Miyamoto, MD;  Location: [facility] OR;  Service: General;  Laterality: Right;  TAP BLOCK  . INSERTION OF MESH Right 01/25/2019   Procedure: Insertion Of Mesh;  Surgeon: Abigail Miyamoto, MD;  Location: [facility] OR;  Service: General;  Laterality: Right;  Marland Kitchen VIDEO BRONCHOSCOPY WITH ENDOBRONCHIAL ULTRASOUND N/A 10/30/2019   Procedure: VIDEO BRONCHOSCOPY WITH ENDOBRONCHIAL ULTRASOUND;  Surgeon: Lorin Glass, MD;  Location: [facility] ENDOSCOPY;  Service: Pulmonary;  Laterality: N/A;    REVIEW OF SYSTEMS:  A comprehensive review of systems was negative.   PHYSICAL EXAMINATION: General appearance: alert, cooperative and no distress Head: Normocephalic, without obvious abnormality, atraumatic Neck: no adenopathy, no JVD, supple, symmetrical, trachea midline and thyroid not enlarged, symmetric, no tenderness/mass/nodules Lymph nodes: Cervical, supraclavicular, and axillary nodes normal. Resp: clear to auscultation bilaterally Back: symmetric, no curvature. ROM normal. No CVA tenderness. Cardio: regular rate and rhythm, S1, S2 normal, no murmur, click, rub or gallop GI: soft, non-tender; bowel sounds normal; no masses,  no organomegaly Extremities: extremities normal, atraumatic, no cyanosis or edema  ECOG PERFORMANCE STATUS: 1 - Symptomatic but completely ambulatory  Blood pressure 131/82, pulse 62, temperature 97.9 F (36.6 C), temperature source Tympanic, resp. rate 15, height 6\' 2"  (1.88 m), weight 158 lb 6.4 oz (71.8 kg), SpO2 100 %.  LABORATORY DATA: Lab Results  Component Value Date   WBC 6.3 08/18/2020   HGB 14.2 08/18/2020   HCT 40.3 08/18/2020   MCV 95.7 08/18/2020   PLT 209 08/18/2020      Chemistry      Component Value Date/Time   NA 127 (L) 08/18/2020 1130   NA 129  (L) 11/24/2018 1520   K 4.2 08/18/2020 1130   CL 97 (L) 08/18/2020 1130   CO2 22 08/18/2020 1130   BUN 6 (L) 08/18/2020 1130   BUN 8 11/24/2018 1520   CREATININE 0.68 08/18/2020 1130   CREATININE 0.70 08/12/2020 0942      Component Value Date/Time   CALCIUM 8.9 08/18/2020 1130   ALKPHOS 43 08/18/2020 1130   AST 21 08/18/2020 1130   AST 21 08/12/2020 0942   ALT 11 08/18/2020 1130   ALT 8 08/12/2020 0942   BILITOT 0.7 08/18/2020 1130   BILITOT 0.4 08/12/2020 0942       RADIOGRAPHIC STUDIES: CT Chest W Contrast  Result Date: 08/10/2020 CLINICAL DATA:  Restaging non-small cell lung cancer. History of chemotherapy and radiation therapy. EXAM: CT CHEST WITH CONTRAST TECHNIQUE: Multidetector CT imaging of the chest was performed during intravenous contrast administration. CONTRAST:  64mL OMNIPAQUE IOHEXOL 300 MG/ML  SOLN COMPARISON:  PET-CT 11/12/2019 and chest CTs 02/05/2020 and 05/19/2020 FINDINGS: Cardiovascular: CP the heart is normal in size. No pericardial effusion. Mild tortuosity, ectasia and atherosclerotic calcification involving the thoracic aorta. No dissection or focal aneurysm. Stable coronary artery calcifications. Mediastinum/Nodes: Small scattered mediastinal and hilar lymph  nodes are stable. No findings for new adenopathy. The esophagus is grossly normal. Thyroid gland is unremarkable. Lungs/Pleura: Extensive radiation changes involving the right paramediastinal lung and right hilum. I do not see any findings suspicious for recurrent tumor. Stable calcified granuloma the right lung base. A few tiny scattered left lower lobe pulmonary nodules likely inflammatory given their appearance. No findings suspicious for pulmonary metastatic disease. Upper Abdomen: Enlarging peripherally enhancing mass in the left hepatic lobe in segment 4A highly suspicious for metastatic disease. This measures approximately 4.5 x 4.0 cm. A few other smaller low-attenuation lesions are suspected. MRI liver  or PET-CT may be helpful for further evaluation. No findings suspicious for adrenal gland metastasis. No upper abdominal lymphadenopathy. Musculoskeletal: No significant bony findings. No evidence of osseous metastatic disease. IMPRESSION: 1. Extensive radiation changes involving the right paramediastinal lung and right hilum but no findings suspicious for recurrent tumor. 2. Peripherally enhancing mass in the left hepatic lobe in segment 4A highly suspicious for metastatic disease. A few other smaller low-attenuation lesions are suspected. MRI liver or PET-CT may be helpful for further evaluation. 3. No findings suspicious for pulmonary metastatic disease. 4. Stable atherosclerotic calcifications involving the thoracic aorta and coronary arteries. 5. Emphysema and aortic atherosclerosis. Aortic Atherosclerosis (ICD10-I70.0) and Emphysema (ICD10-J43.9). Electronically Signed   By: Marijo Sanes M.D.   On: 08/10/2020 17:09   [place] CORE BIOPSY (LIVER)  Result Date: 08/20/2020 INDICATION: 70 year old male with history of non-small cell lung cancer and indeterminate liver mass noted on chest CT from 08/08/2020. Sonographically occult lesion at time of previously scheduled ultrasound-guided liver mass biopsy on 08/18/2020. Presents today for contrast enhanced ultrasound guided liver mass biopsy. EXAM: ULTRASOUND BIOPSY CORE LIVER; US LIVER WITH  CONTRAST 1ST LESION MEDICATIONS: 5 mL Lumason, intravenous, 2 injections total ANESTHESIA/SEDATION: Moderate (conscious) sedation was employed during this procedure. A total of Versed 2 mg and Fentanyl 100 mcg was administered intravenously. Moderate Sedation Time: 14 minutes. The patient's level of consciousness and vital signs were monitored continuously by radiology nursing throughout the procedure under my direct supervision. COMPLICATIONS: None immediate. PROCEDURE: Informed written consent was obtained from the patient after a thorough discussion of the procedural risks,  benefits and alternatives. All questions were addressed. Maximal Sterile Barrier Technique was utilized including caps, mask, sterile gowns, sterile gloves, sterile drape, hand hygiene and skin antiseptic. A timeout was performed prior to the initiation of the procedure. Preprocedure ultrasound demonstrated a subtle, mildly hypoechoic mass in segment 4 compatible with findings on CT. Contrast enhanced ultrasound was utilized to further characterize and identify the mass: Observation 1 Lobe: Left Size: 3.0 x 3.3 x 3.6 cm Arterial phase enhancement features: Rim with core of non-enhancement Onset of washout: 120 seconds Degree of washout: Mild Tumor in vein: No CEUS LI-RADS category: Not applicable. The right upper quadrant was prepped and draped in standard fashion. Local anesthesia was administered subdermally at the planned entry site as well as under ultrasound guidance along the hepatic capsule. A skin nick was made. A 17 gauge introducer needle was advanced to the hepatic parenchyma under ultrasound guidance. An additional dose of ultrasound contrast was administered for precise needle placement. The 17 gauge introducer needle was directed into the superior aspect of the mass to a region of hyperenhancement. Next, a total of 3, 18 gauge core biopsies were obtained. The samples were placed in formalin and sent to Pathology. Under ultrasound guidance, a Gel-Foam slurry was administered along the needle entry tract as the introducer needle was  withdrawn. Postprocedure ultrasound demonstrated no evidence of perihepatic fluid collection. The patient tolerated the procedure well. IMPRESSION: 1. Contrast enhanced ultrasound of hepatic segment 4 demonstrates a 3.6 cm mass with enhancement features compatible with metastasis. 2. Technically successful contrast enhanced ultrasound-guided nonfocal core liver biopsy from mass in hepatic segment IV. Ruthann Cancer, MD [facility]  [facility] Radiology Electronically Signed   By: Ruthann Cancer MD   On: 08/20/2020 10:25   US LIVER W/CM 1ST LESION  Result Date: 08/20/2020 INDICATION: 70 year old male with history of non-small cell lung cancer and indeterminate liver mass noted on chest CT from 08/08/2020. Sonographically occult lesion at time of previously scheduled ultrasound-guided liver mass biopsy on 08/18/2020. Presents today for contrast enhanced ultrasound guided liver mass biopsy. EXAM: ULTRASOUND BIOPSY CORE LIVER; US LIVER WITH  CONTRAST 1ST LESION MEDICATIONS: 5 mL Lumason, intravenous, 2 injections total ANESTHESIA/SEDATION: Moderate (conscious) sedation was employed during this procedure. A total of Versed 2 mg and Fentanyl 100 mcg was administered intravenously. Moderate Sedation Time: 14 minutes. The patient's level of consciousness and vital signs were monitored continuously by radiology nursing throughout the procedure under my direct supervision. COMPLICATIONS: None immediate. PROCEDURE: Informed written consent was obtained from the patient after a thorough discussion of the procedural risks, benefits and alternatives. All questions were addressed. Maximal Sterile Barrier Technique was utilized including caps, mask, sterile gowns, sterile gloves, sterile drape, hand hygiene and skin antiseptic. A timeout was performed prior to the initiation of the procedure. Preprocedure ultrasound demonstrated a subtle, mildly hypoechoic mass in segment 4 compatible with findings on CT. Contrast enhanced ultrasound was utilized to further characterize and identify the mass: Observation 1 Lobe: Left Size: 3.0 x 3.3 x 3.6 cm Arterial phase enhancement features: Rim with core of non-enhancement Onset of washout: 120 seconds Degree of washout: Mild Tumor in vein: No CEUS LI-RADS category: Not applicable. The right upper quadrant was prepped and draped in standard fashion. Local anesthesia was administered subdermally at the planned entry  site as well as under ultrasound guidance along the hepatic capsule. A skin nick was made. A 17 gauge introducer needle was advanced to the hepatic parenchyma under ultrasound guidance. An additional dose of ultrasound contrast was administered for precise needle placement. The 17 gauge introducer needle was directed into the superior aspect of the mass to a region of hyperenhancement. Next, a total of 3, 18 gauge core biopsies were obtained. The samples were placed in formalin and sent to Pathology. Under ultrasound guidance, a Gel-Foam slurry was administered along the needle entry tract as the introducer needle was withdrawn. Postprocedure ultrasound demonstrated no evidence of perihepatic fluid collection. The patient tolerated the procedure well. IMPRESSION: 1. Contrast enhanced ultrasound of hepatic segment 4 demonstrates a 3.6 cm mass with enhancement features compatible with metastasis. 2. Technically successful contrast enhanced ultrasound-guided nonfocal core liver biopsy from mass in hepatic segment IV. Ruthann Cancer, MD [facility] Radiology Electronically Signed   By: Ruthann Cancer MD   On: 08/20/2020 10:25   US Abdomen Limited RUQ (LIVER/GB)  Result Date: 08/18/2020 CLINICAL DATA:  70 year old male with non-small cell lung cancer in incidentally noted segment 4 a peripherally enhancing mass on recent chest CT. Presents today for ultrasound-guided liver mass biopsy. EXAM: ULTRASOUND ABDOMEN LIMITED RIGHT UPPER QUADRANT COMPARISON:  08/08/2020 FINDINGS: No focal lesion identified. Within normal limits in parenchymal echogenicity and echotexture. IMPRESSION: The previously identified segment 4A mass is completely sonographically occult. No biopsy was performed.  PLAN: The patient is rescheduled for contrast enhanced ultrasound guided liver mass biopsy later this week. Ruthann Cancer, MD [facility] Radiology  Electronically Signed   By: Ruthann Cancer MD   On: 08/18/2020 14:11    ASSESSMENT AND PLAN: This is a very pleasant 70 years old white male with suspicious metastatic non-small cell lung cancer initially diagnosed as stage IIIA/B (T3, N1/N2, M0) non-small cell lung cancer, adenocarcinoma presented with right upper lobe lung mass with direct extension across the major fissure into the superior segment of the right lower lobe and ipsilateral hilar lymphadenopathy as well as questionable subcarinal lymphadenopathy. The patient completed a course of concurrent chemoradiation with weekly carboplatin and paclitaxel status post 7 cycles.  He tolerated this treatment well with no concerning adverse effect except for mild sore throat and mouth. The patient had partial response to this treatment. The patient is currently undergoing a course of consolidation treatment with immunotherapy with Imfinzi 1500 mg IV every 4 weeks status post 6 cycles.  He has been tolerating his treatment well with no concerning adverse effects. His last CT scan showed no concerning findings for disease progression in the chest but the patient has a highly suspicious liver lesion concerning for metastatic disease.  The patient underwent ultrasound-guided core biopsy of this lesion but the final pathology is still pending and it was sent for consultation at a tertiary center. I have the final pathology is not consistent with metastatic lung cancer we will resume his treatment with immunotherapy with Imfinzi but if the final pathology is consistent with metastatic non-small cell lung cancer, we may consider switching his treatment to oral Lumakras (Sotorasib). I recommended for the patient to hold his treatment for now until the biopsy becomes available. I will see him back for follow-up visit after the biopsy for reevaluation and more discussion of his treatment options. He was advised to call immediately if he has any concerning symptoms  in the interval.  The patient voices understanding of current disease status and treatment options and is in agreement with the current care plan.  All questions were answered. The patient knows to call the clinic with any problems, questions or concerns. We can certainly see the patient much sooner if necessary.  Disclaimer: This note was dictated with voice recognition software. Similar sounding words can inadvertently be transcribed and may not be corrected upon review.

## 2020-09-01 ENCOUNTER — Telehealth: Payer: Self-pay | Admitting: Medical Oncology

## 2020-09-01 NOTE — Telephone Encounter (Signed)
Per Dr Julien Nordmann , I told wife::  " if the final pathology is consistent with metastatic non-small cell lung cancer, we may consider switching his treatment to oral Lumakras (Sotorasib). I recommended for the patient to hold his treatment for now until the biopsy becomes available."  Wife aware of above.

## 2020-09-03 NOTE — Telephone Encounter (Signed)
Pts wife left a message wanting to know if the pathology report has finalized.  I reviewed pts chart and advised the path report is still in process. She expressed she doesn't like that it's still in process but understands and will call back again in a few days.

## 2020-09-08 ENCOUNTER — Telehealth: Payer: Self-pay | Admitting: Medical Oncology

## 2020-09-08 NOTE — Telephone Encounter (Signed)
What did the biopsy show? Next appt 4/20.

## 2020-09-08 NOTE — Telephone Encounter (Addendum)
Pt did not request hospital bed.

## 2020-09-09 ENCOUNTER — Inpatient Hospital Stay: Payer: Medicare Other

## 2020-09-09 ENCOUNTER — Inpatient Hospital Stay: Payer: Medicare Other | Admitting: Internal Medicine

## 2020-09-09 ENCOUNTER — Telehealth: Payer: Self-pay | Admitting: Medical Oncology

## 2020-09-09 NOTE — Telephone Encounter (Signed)
Surgical pathology- Per [place] surgical path dept, [place] notified.that path report will be available on wed .  case # [id number].

## 2020-09-11 ENCOUNTER — Telehealth: Payer: Self-pay | Admitting: Medical Oncology

## 2020-09-11 ENCOUNTER — Telehealth: Payer: Self-pay | Admitting: Internal Medicine

## 2020-09-11 NOTE — Telephone Encounter (Signed)
Scheduled per 4/7 sch msg. RN Diane will call pts wife to confirm appt times

## 2020-09-11 NOTE — Telephone Encounter (Signed)
Path -Staff said the path report is back from [facility] and he has to wait for his lead team member to further direct him. Dr Allena Napoleon is pathologist. Message sent to Dr Allena Napoleon.

## 2020-09-11 NOTE — Telephone Encounter (Addendum)
Pt is very upset as path results not back.  Dr.[name] got the path report  and requested pt see him tomorrow at 0915 with labs and schedule treatment. LVM on pts wife"s phone. Schedule message sent.

## 2020-09-12 ENCOUNTER — Other Ambulatory Visit: Payer: Self-pay

## 2020-09-12 ENCOUNTER — Inpatient Hospital Stay: Payer: Medicare Other

## 2020-09-12 ENCOUNTER — Inpatient Hospital Stay: Payer: Medicare Other | Attending: Physician Assistant | Admitting: Internal Medicine

## 2020-09-12 ENCOUNTER — Encounter: Payer: Self-pay | Admitting: Internal Medicine

## 2020-09-12 ENCOUNTER — Telehealth: Payer: Self-pay | Admitting: Medical Oncology

## 2020-09-12 VITALS — BP 135/82 | HR 59 | Resp 16 | Wt 158.0 lb

## 2020-09-12 DIAGNOSIS — K769 Liver disease, unspecified: Secondary | ICD-10-CM | POA: Diagnosis not present

## 2020-09-12 DIAGNOSIS — Z9221 Personal history of antineoplastic chemotherapy: Secondary | ICD-10-CM | POA: Diagnosis not present

## 2020-09-12 DIAGNOSIS — I4891 Unspecified atrial fibrillation: Secondary | ICD-10-CM | POA: Diagnosis not present

## 2020-09-12 DIAGNOSIS — Z923 Personal history of irradiation: Secondary | ICD-10-CM | POA: Diagnosis not present

## 2020-09-12 DIAGNOSIS — Z5112 Encounter for antineoplastic immunotherapy: Secondary | ICD-10-CM | POA: Diagnosis not present

## 2020-09-12 DIAGNOSIS — Z79899 Other long term (current) drug therapy: Secondary | ICD-10-CM | POA: Diagnosis not present

## 2020-09-12 DIAGNOSIS — C3491 Malignant neoplasm of unspecified part of right bronchus or lung: Secondary | ICD-10-CM

## 2020-09-12 DIAGNOSIS — C3411 Malignant neoplasm of upper lobe, right bronchus or lung: Secondary | ICD-10-CM | POA: Diagnosis not present

## 2020-09-12 DIAGNOSIS — K8309 Other cholangitis: Secondary | ICD-10-CM

## 2020-09-12 DIAGNOSIS — Z7901 Long term (current) use of anticoagulants: Secondary | ICD-10-CM | POA: Diagnosis not present

## 2020-09-12 DIAGNOSIS — I10 Essential (primary) hypertension: Secondary | ICD-10-CM | POA: Insufficient documentation

## 2020-09-12 LAB — CBC WITH DIFFERENTIAL (CANCER CENTER ONLY)
Abs Immature Granulocytes: 0.01 10*3/uL (ref 0.00–0.07)
Basophils Absolute: 0 10*3/uL (ref 0.0–0.1)
Basophils Relative: 1 %
Eosinophils Absolute: 0.1 10*3/uL (ref 0.0–0.5)
Eosinophils Relative: 1 %
HCT: 41.1 % (ref 39.0–52.0)
Hemoglobin: 14.7 g/dL (ref 13.0–17.0)
Immature Granulocytes: 0 %
Lymphocytes Relative: 14 %
Lymphs Abs: 0.7 10*3/uL (ref 0.7–4.0)
MCH: 33.6 pg (ref 26.0–34.0)
MCHC: 35.8 g/dL (ref 30.0–36.0)
MCV: 94.1 fL (ref 80.0–100.0)
Monocytes Absolute: 0.6 10*3/uL (ref 0.1–1.0)
Monocytes Relative: 13 %
Neutro Abs: 3.3 10*3/uL (ref 1.7–7.7)
Neutrophils Relative %: 71 %
Platelet Count: 208 10*3/uL (ref 150–400)
RBC: 4.37 MIL/uL (ref 4.22–5.81)
RDW: 12.8 % (ref 11.5–15.5)
WBC Count: 4.7 10*3/uL (ref 4.0–10.5)
nRBC: 0 % (ref 0.0–0.2)

## 2020-09-12 LAB — CMP (CANCER CENTER ONLY)
ALT: 8 U/L (ref 0–44)
AST: 25 U/L (ref 15–41)
Albumin: 3.8 g/dL (ref 3.5–5.0)
Alkaline Phosphatase: 64 U/L (ref 38–126)
Anion gap: 14 (ref 5–15)
BUN: 6 mg/dL — ABNORMAL LOW (ref 8–23)
CO2: 18 mmol/L — ABNORMAL LOW (ref 22–32)
Calcium: 9 mg/dL (ref 8.9–10.3)
Chloride: 100 mmol/L (ref 98–111)
Creatinine: 0.76 mg/dL (ref 0.61–1.24)
GFR, Estimated: >60 mL/min (ref >60)
Glucose, Bld: 112 mg/dL — ABNORMAL HIGH (ref 70–99)
Potassium: 4.5 mmol/L (ref 3.5–5.1)
Sodium: 132 mmol/L — ABNORMAL LOW (ref 135–145)
Total Bilirubin: 0.6 mg/dL (ref 0.3–1.2)
Total Protein: 7.6 g/dL (ref 6.5–8.1)

## 2020-09-12 LAB — TSH: TSH: 1.594 u[IU]/mL (ref 0.320–4.118)

## 2020-09-12 MED ORDER — PANTOPRAZOLE SODIUM 40 MG PO TBEC: 40.0000 mg | DELAYED_RELEASE_TABLET | Freq: Every day | ORAL | 1 refills | Status: DC

## 2020-09-12 MED ORDER — PREDNISONE 20 MG PO TABS: ORAL_TABLET | ORAL | 0 refills | Status: DC

## 2020-09-12 NOTE — Telephone Encounter (Signed)
Called [facility] with referral.  Appt with GI Laqueta Jean ,PA 09/16/20 @ 1330-wife confirmed appt.

## 2020-09-12 NOTE — Telephone Encounter (Signed)
LVM that pt has appt today.

## 2020-09-12 NOTE — Progress Notes (Signed)
[place] [phone]   Fax:([phone]) [place], MD [address] [place] [address]  DIAGNOSIS:  Metastatic non-small cell lung cancer initially diagnosed as stage IIIA/B (T3, N1/N2, M0)non-small cell lung cancer, adenocarcinoma. He presented with a right upper lobe lung mass with direct extension across the major fissure into the superior segment of the right lower lobe and ipsilateral hilar lymphadenopathy. There is questionable subcarinal lymphadenopathy versus esophageal hypermetabolism this was evaluated by gastroenterology with upper endoscopy on 01/30/2020 and there was no mass lesion or malignancy in that area. The patient was diagnosed in May 2021.  Biomarker Findings Microsatellite status - MS-Stable Tumor Mutational Burden - 9 Muts/Mb Genomic Findings For a complete list of the genes assayed, please refer to the Appendix. KRAS G12C CDKN2A/B p16INK4a S12* NKX2-1 amplification - equivocal? PMS2 R3Q TP53 P75f*38 7 Disease relevant genes with no reportable alterations: ALK, BRAF, EGFR, ERBB2, MET, RET, ROS1  PDL1: 70%  PRIOR THERAPY:  1) Weekly concurrent chemoradiation with carboplatin for an AUC of 2 and paclitaxel 45 mg/m. First dose expected on 11/26/2019. Status post 7 cycles.   Last dose was given 01/07/2020 with partial response. 2) Consolidation treatment with immunotherapy with Imfinzi 1500 mg IV every 4 weeks.  First dose 02/19/2020. Status post 6 cycles.  This was discontinued secondary to suspicious immunotherapy mediated cholangitis   CURRENT THERAPY:  Observation.  INTERVAL HISTORY: Christopher [id number].[id number]. male returns to the clinic today for follow-up visit accompanied by his wife.  The patient is complaining of fatigue and feeling tired most of the time.  He denied having any current chest pain, shortness of breath, cough or hemoptysis.  He denied having any fever  or chills.  He has no nausea, vomiting, diarrhea or constipation.  He has no headache or visual changes.  He has no weight loss or night sweats.  His treatment has been on hold now for several weeks secondary to suspicious lesion in the liver.  The patient underwent ultrasound-guided core biopsy of the liver lesion [facility] but likely inflammatory process possibly a biliary process.  The patient is here today for evaluation and recommendation regarding his condition and treatment options.  MEDICAL HISTORY: Past Medical History:  Diagnosis Date  . A-fib ([facility]    s/p DCCV 12/07/18  . Cancer ([facility]   . Depression    situational  . Dysrhythmia   . Hypertension     ALLERGIES:  has No Known Allergies.  MEDICATIONS:  Current Outpatient Medications  Medication Sig Dispense Refill  . ibuprofen (ADVIL) 200 MG tablet Take 400 mg by mouth every 8 (eight) hours as needed (for pain).  (Patient not taking: No sig reported)    . metoprolol tartrate (LOPRESSOR) 50 MG tablet Take 50 mg by mouth 2 (two) times daily with a meal.    . Omeprazole (PRILOSEC PO) Take 20 mg by mouth.    . prochlorperazine (COMPAZINE) 10 MG tablet Take 1 tablet (10 mg total) by mouth every 6 (six) hours as needed. (Patient not taking: No sig reported) 30 tablet 2  . sucralfate (CARAFATE) 1 g tablet Crush and mix in 1 oz water and drink 5 min TID before meals and at HS for radiation induced esophagitis (Patient not taking: No sig reported) 120 tablet 2  . XARELTO 20 MG  TABS tablet Take 20 mg by mouth daily in the afternoon.      Current Facility-Administered Medications  Medication Dose Route Frequency Provider Last Rate Last Admin  . 0.9 %  sodium chloride infusion  500 mL Intravenous Continuous Thornton Park, MD      . 0.9 %  sodium chloride infusion  500 mL Intravenous Once Thornton Park, MD        SURGICAL  HISTORY:  Past Surgical History:  Procedure Laterality Date  . CARDIOVERSION N/A 12/07/2018   Procedure: CARDIOVERSION;  Surgeon: Josue Hector, MD;  Location: [facility] ENDOSCOPY;  Service: Cardiovascular;  Laterality: N/A;  . FINE NEEDLE ASPIRATION  10/30/2019   Procedure: FINE NEEDLE ASPIRATION (FNA) LINEAR;  Surgeon: Candee Furbish, MD;  Location: [facility] ENDOSCOPY;  Service: Pulmonary;;  . INGUINAL HERNIA REPAIR Right 01/25/2019   Procedure: RIGHT INGUINAL HERNIA REPAIR WITH MESH;  Surgeon: Coralie Keens, MD;  Location: [place];  Service: General;  Laterality: Right;  TAP BLOCK  . INSERTION OF MESH Right 01/25/2019   Procedure: Insertion Of Mesh;  Surgeon: Coralie Keens, MD;  Location: [place];  Service: General;  Laterality: Right;  Marland Kitchen VIDEO BRONCHOSCOPY WITH ENDOBRONCHIAL ULTRASOUND N/A 10/30/2019   Procedure: VIDEO BRONCHOSCOPY WITH ENDOBRONCHIAL ULTRASOUND;  Surgeon: Candee Furbish, MD;  Location: [facility] ENDOSCOPY;  Service: Pulmonary;  Laterality: N/A;    REVIEW OF SYSTEMS:  Constitutional: positive for fatigue Eyes: negative Ears, nose, mouth, throat, and face: negative Respiratory: negative Cardiovascular: negative Gastrointestinal: negative Genitourinary:negative Integument/breast: negative Hematologic/lymphatic: negative Musculoskeletal:negative Neurological: negative Behavioral/Psych: negative Endocrine: negative Allergic/Immunologic: negative   PHYSICAL EXAMINATION: General appearance: alert, cooperative, fatigued and no distress Head: Normocephalic, without obvious abnormality, atraumatic Neck: no adenopathy, no JVD, supple, symmetrical, trachea midline and thyroid not enlarged, symmetric, no tenderness/mass/nodules Lymph nodes: Cervical, supraclavicular, and axillary nodes normal. Resp: clear to auscultation bilaterally Back: symmetric, no curvature. ROM normal. No CVA tenderness. Cardio: regular rate and rhythm, S1, S2 normal, no murmur, click, rub or gallop GI: soft,  non-tender; bowel sounds normal; no masses,  no organomegaly Extremities: extremities normal, atraumatic, no cyanosis or edema Neurologic: Alert and oriented X 3, normal strength and tone. Normal symmetric reflexes. Normal coordination and gait  ECOG PERFORMANCE STATUS: 1 - Symptomatic but completely ambulatory  Blood pressure 135/82, pulse (!) 59, resp. rate 16, weight 158 lb 0.7 oz (71.7 kg), SpO2 100 %.  LABORATORY DATA: Lab Results  Component Value Date   WBC 4.7 09/12/2020   HGB 14.7 09/12/2020   HCT 41.1 09/12/2020   MCV 94.1 09/12/2020   PLT 208 09/12/2020      Chemistry      Component Value Date/Time   NA 127 (L) 08/18/2020 1130   NA 129 (L) 11/24/2018 1520   K 4.2 08/18/2020 1130   CL 97 (L) 08/18/2020 1130   CO2 22 08/18/2020 1130   BUN 6 (L) 08/18/2020 1130   BUN 8 11/24/2018 1520   CREATININE 0.68 08/18/2020 1130   CREATININE 0.70 08/12/2020 0942      Component Value Date/Time   CALCIUM 8.9 08/18/2020 1130   ALKPHOS 43 08/18/2020 1130   AST 21 08/18/2020 1130   AST 21 08/12/2020 0942   ALT 11 08/18/2020 1130   ALT 8 08/12/2020 0942   BILITOT 0.7 08/18/2020 1130   BILITOT 0.4 08/12/2020 0942       RADIOGRAPHIC STUDIES: [place] CORE BIOPSY (LIVER)  Result Date: 08/20/2020 INDICATION: 70 year old male with history of non-small cell lung cancer and indeterminate liver mass  noted on chest CT from 08/08/2020. Sonographically occult lesion at time of previously scheduled ultrasound-guided liver mass biopsy on 08/18/2020. Presents today for contrast enhanced ultrasound guided liver mass biopsy. EXAM: ULTRASOUND BIOPSY CORE LIVER; US LIVER WITH  CONTRAST 1ST LESION MEDICATIONS: 5 mL Lumason, intravenous, 2 injections total ANESTHESIA/SEDATION: Moderate (conscious) sedation was employed during this procedure. A total of Versed 2 mg and Fentanyl 100 mcg was administered intravenously. Moderate Sedation Time: 14 minutes. The patient's level of consciousness and vital signs  were monitored continuously by radiology nursing throughout the procedure under my direct supervision. COMPLICATIONS: None immediate. PROCEDURE: Informed written consent was obtained from the patient after a thorough discussion of the procedural risks, benefits and alternatives. All questions were addressed. Maximal Sterile Barrier Technique was utilized including caps, mask, sterile gowns, sterile gloves, sterile drape, hand hygiene and skin antiseptic. A timeout was performed prior to the initiation of the procedure. Preprocedure ultrasound demonstrated a subtle, mildly hypoechoic mass in segment 4 compatible with findings on CT. Contrast enhanced ultrasound was utilized to further characterize and identify the mass: Observation 1 Lobe: Left Size: 3.0 x 3.3 x 3.6 cm Arterial phase enhancement features: Rim with core of non-enhancement Onset of washout: 120 seconds Degree of washout: Mild Tumor in vein: No CEUS LI-RADS category: Not applicable. The right upper quadrant was prepped and draped in standard fashion. Local anesthesia was administered subdermally at the planned entry site as well as under ultrasound guidance along the hepatic capsule. A skin nick was made. A 17 gauge introducer needle was advanced to the hepatic parenchyma under ultrasound guidance. An additional dose of ultrasound contrast was administered for precise needle placement. The 17 gauge introducer needle was directed into the superior aspect of the mass to a region of hyperenhancement. Next, a total of 3, 18 gauge core biopsies were obtained. The samples were placed in formalin and sent to Pathology. Under ultrasound guidance, a Gel-Foam slurry was administered along the needle entry tract as the introducer needle was withdrawn. Postprocedure ultrasound demonstrated no evidence of perihepatic fluid collection. The patient tolerated the procedure well. IMPRESSION: 1. Contrast enhanced ultrasound of hepatic segment 4 demonstrates a 3.6 cm mass  with enhancement features compatible with metastasis. 2. Technically successful contrast enhanced ultrasound-guided nonfocal core liver biopsy from mass in hepatic segment IV. [name] Suttle, MD [facility] Electronically Signed   By: [name]  Suttle MD   On: 08/20/2020 10:25   US LIVER W/CM 1ST LESION  Result Date: 08/20/2020 INDICATION: 70-year-old male with history of non-small cell lung cancer and indeterminate liver mass noted on chest CT from 08/08/2020. Sonographically occult lesion at time of previously scheduled ultrasound-guided liver mass biopsy on 08/18/2020. Presents today for contrast enhanced ultrasound guided liver mass biopsy. EXAM: ULTRASOUND BIOPSY CORE LIVER; US LIVER WITH  CONTRAST 1ST LESION MEDICATIONS: 5 mL Lumason, intravenous, 2 injections total ANESTHESIA/SEDATION: Moderate (conscious) sedation was employed during this procedure. A total of Versed 2 mg and Fentanyl 100 mcg was administered intravenously. Moderate Sedation Time: 14 minutes. The patient's level of consciousness and vital signs were monitored continuously by radiology nursing throughout the procedure under my direct supervision. COMPLICATIONS: None immediate. PROCEDURE: Informed written consent was obtained from the patient after a thorough discussion of the procedural risks, benefits and alternatives. All questions were addressed. Maximal Sterile Barrier Technique was utilized including caps, mask, sterile gowns, sterile gloves, sterile drape, hand hygiene and skin antiseptic. A timeout was performed prior to the initiation of the   procedure. Preprocedure ultrasound demonstrated a subtle, mildly hypoechoic mass in segment 4 compatible with findings on CT. Contrast enhanced ultrasound was utilized to further characterize and identify the mass: Observation 1 Lobe: Left Size: 3.0 x 3.3 x 3.6 cm Arterial phase enhancement features: Rim with core of non-enhancement Onset  of washout: 120 seconds Degree of washout: Mild Tumor in vein: No CEUS LI-RADS category: Not applicable. The right upper quadrant was prepped and draped in standard fashion. Local anesthesia was administered subdermally at the planned entry site as well as under ultrasound guidance along the hepatic capsule. A skin nick was made. A 17 gauge introducer needle was advanced to the hepatic parenchyma under ultrasound guidance. An additional dose of ultrasound contrast was administered for precise needle placement. The 17 gauge introducer needle was directed into the superior aspect of the mass to a region of hyperenhancement. Next, a total of 3, 18 gauge core biopsies were obtained. The samples were placed in formalin and sent to Pathology. Under ultrasound guidance, a Gel-Foam slurry was administered along the needle entry tract as the introducer needle was withdrawn. Postprocedure ultrasound demonstrated no evidence of perihepatic fluid collection. The patient tolerated the procedure well. IMPRESSION: 1. Contrast enhanced ultrasound of hepatic segment 4 demonstrates a 3.6 cm mass with enhancement features compatible with metastasis. 2. Technically successful contrast enhanced ultrasound-guided nonfocal core liver biopsy from mass in hepatic segment IV. [name] Suttle, MD [facility] Electronically Signed   By: [name]  Suttle MD   On: 08/20/2020 10:25   US Abdomen Limited RUQ (LIVER/GB)  Result Date: 08/18/2020 CLINICAL DATA:  70-year-old male with non-small cell lung cancer in incidentally noted segment 4 a peripherally enhancing mass on recent chest CT. Presents today for ultrasound-guided liver mass biopsy. EXAM: ULTRASOUND ABDOMEN LIMITED RIGHT UPPER QUADRANT COMPARISON:  08/08/2020 FINDINGS: No focal lesion identified. Within normal limits in parenchymal echogenicity and echotexture. IMPRESSION: The previously identified segment 4A mass is completely  sonographically occult. No biopsy was performed. PLAN: The patient is rescheduled for contrast enhanced ultrasound guided liver mass biopsy later this week. [name] Suttle, MD [facility] [place] Electronically Signed   By: [name]  Suttle MD   On: 08/18/2020 14:11    ASSESSMENT AND PLAN: This is a very pleasant 70 years old white male with suspicious metastatic non-small cell lung cancer initially diagnosed as stage IIIA/B (T3, N1/N2, M0) non-small cell lung cancer, adenocarcinoma presented with right upper lobe lung mass with direct extension across the major fissure into the superior segment of the right lower lobe and ipsilateral hilar lymphadenopathy as well as questionable subcarinal lymphadenopathy. The patient completed a course of concurrent chemoradiation with weekly carboplatin and paclitaxel status post 7 cycles.  He tolerated this treatment well with no concerning adverse effect except for mild sore throat and mouth. The patient had partial response to this treatment. The patient is currently undergoing a course of consolidation treatment with immunotherapy with Imfinzi 1500 mg IV every 4 weeks status post 6 cycles.  He has been tolerating his treatment well with no concerning adverse effects. His last CT scan showed no concerning findings for disease progression in the chest but the patient has a highly suspicious liver lesion concerning for metastatic disease.  The patient underwent ultrasound-guided core biopsy of this lesion. The final pathology read at [facility] in addition to [facility] showed no evidence of malignancy and there was suspicious inflammatory process of biliary origin. I had   a lengthy discussion with the patient and his wife today about his current condition and treatment options. The inflammatory process in the liver could be a result of immunotherapy mediated cholangitis which has been reported in several  cases of patient treated with immunotherapy. The patient has a stage III lung cancer and I recommended for him to discontinue his consolidation treatment with immunotherapy at this point.  He has no evidence of disease progression in the chest. I will continue to monitor him closely and if he has evidence for disease progression in the future, I would consider him for systemic therapy or treatment with Lumakras (Sotorasib) if needed. For the suspicious immunotherapy mediated sclerosing cholangitis, I will start the patient on a tapered dose of prednisone at 1 mg/KG daily to be tapered over the next 5 weeks.  I will also refer the patient to his gastroenterologist for evaluation and recommendation regarding this condition. For the acid reflux, I will give the patient prescription for Protonix to be used daily during the treatment with prednisone. The patient will come back for follow-up visit in around 5 weeks for evaluation and repeat blood work. He was advised to call immediately if he has any other concerning symptoms in the interval. The patient voices understanding of current disease status and treatment options and is in agreement with the current care plan.  All questions were answered. The patient knows to call the clinic with any problems, questions or concerns. We can certainly see the patient much sooner if necessary.  Disclaimer: This note was dictated with voice recognition software. Similar sounding words can inadvertently be transcribed and may not be corrected upon review.        

## 2020-09-15 ENCOUNTER — Telehealth: Payer: Self-pay | Admitting: Internal Medicine

## 2020-09-15 DIAGNOSIS — C3491 Malignant neoplasm of unspecified part of right bronchus or lung: Secondary | ICD-10-CM | POA: Diagnosis not present

## 2020-09-15 DIAGNOSIS — I4891 Unspecified atrial fibrillation: Secondary | ICD-10-CM | POA: Diagnosis not present

## 2020-09-15 DIAGNOSIS — I10 Essential (primary) hypertension: Secondary | ICD-10-CM | POA: Diagnosis not present

## 2020-09-15 NOTE — Telephone Encounter (Signed)
Scheduled per los. Called and spoke with patient. Confirmed appt 

## 2020-09-16 ENCOUNTER — Ambulatory Visit: Payer: Medicare Other | Admitting: Physician Assistant

## 2020-09-24 ENCOUNTER — Inpatient Hospital Stay: Payer: Medicare Other

## 2020-09-24 ENCOUNTER — Inpatient Hospital Stay: Payer: Medicare Other | Admitting: Internal Medicine

## 2020-10-04 ENCOUNTER — Other Ambulatory Visit: Payer: Self-pay | Admitting: Internal Medicine

## 2020-10-08 ENCOUNTER — Ambulatory Visit (INDEPENDENT_AMBULATORY_CARE_PROVIDER_SITE_OTHER): Payer: Medicare Other | Admitting: Physician Assistant

## 2020-10-08 ENCOUNTER — Encounter: Payer: Self-pay | Admitting: Physician Assistant

## 2020-10-08 VITALS — BP 102/68 | HR 58 | Ht 74.0 in | Wt 156.8 lb

## 2020-10-08 DIAGNOSIS — R932 Abnormal findings on diagnostic imaging of liver and biliary tract: Secondary | ICD-10-CM

## 2020-10-08 NOTE — Patient Instructions (Signed)
If you are age 70 or older, your body mass index should be between 23-30. Your Body mass index is 20.13 kg/m. If this is out of the aforementioned range listed, please consider follow up with your Primary Care Provider.  If you are age 78 or younger, your body mass index should be between 19-25. Your Body mass index is 20.13 kg/m. If this is out of the aformentioned range listed, please consider follow up with your Primary Care Provider.   Thank you for choosing me and [facility].  Ellouise Newer, PA-C

## 2020-10-08 NOTE — Progress Notes (Signed)
Chief Complaint: Abnormal liver biopsy  HPI:    Christopher Harding is a 70 year old Caucasian male with a past medical history of liver cancer, A. fib and others listed below, who was referred to me by Dr. Earlie Server for an abnormal liver biopsy..      01/30/2020 EGD with severe radiation esophagitis with bleeding, small hiatal hernia otherwise normal.    08/08/2020 CT of the chest with contrast showed enlarging peripherally enhancing mass in the left hepatic lobe highly suspicious for metastatic disease measuring 4.5 x 4 cm.    08/20/2020 pathology from a left liver lobe lesion showed negative for malignancy.  Is recommended that they have external consultation for the evaluation of a possible biliary process.    09/12/2020 CMP with a sodium minimally decreased at 132 and otherwise normal.  CBC normal.    09/12/2020 patient saw Dr. Earlie Server and at that time he discussed that the liver biopsy showed no evidence for malignancy but likely inflammatory process, possibly biliary.  Dr. Earlie Server went ahead and started a tapered dose of prednisone at 1 mg/kg daily to be tapered over the next 5 weeks.  He was referred to our clinic for further evaluation.    Today, the patient tells me that he was sent here because of biopsy results.  He tells me he is feeling completely fine and has no GI complaints at all.  Explains that he has 1 week left of Prednisone.    Denies fever, chills, weight loss, nausea, vomiting, abdominal pain or change in bowel habits.  Past Medical History:  Diagnosis Date  . A-fib [facility])    s/p DCCV 12/07/18  . Cancer ([place])   . Depression    situational  . Dysrhythmia   . Hypertension     Past Surgical History:  Procedure Laterality Date  . CARDIOVERSION N/A 12/07/2018   Procedure: CARDIOVERSION;  Surgeon: Christopher Hector, MD;  Location: [facility] ENDOSCOPY;  Service: Cardiovascular;  Laterality: N/A;  . FINE NEEDLE ASPIRATION  10/30/2019   Procedure: FINE NEEDLE ASPIRATION (FNA) LINEAR;  Surgeon:  Christopher Furbish, MD;  Location: [facility] ENDOSCOPY;  Service: Pulmonary;;  . INGUINAL HERNIA REPAIR Right 01/25/2019   Procedure: RIGHT INGUINAL HERNIA REPAIR WITH MESH;  Surgeon: Christopher Keens, MD;  Location: [place];  Service: General;  Laterality: Right;  TAP BLOCK  . INSERTION OF MESH Right 01/25/2019   Procedure: Insertion Of Mesh;  Surgeon: Christopher Keens, MD;  Location: [place];  Service: General;  Laterality: Right;  Marland Kitchen VIDEO BRONCHOSCOPY WITH ENDOBRONCHIAL ULTRASOUND N/A 10/30/2019   Procedure: VIDEO BRONCHOSCOPY WITH ENDOBRONCHIAL ULTRASOUND;  Surgeon: Christopher Furbish, MD;  Location: [facility] ENDOSCOPY;  Service: Pulmonary;  Laterality: N/A;    Current Outpatient Medications  Medication Sig Dispense Refill  . metoprolol tartrate (LOPRESSOR) 50 MG tablet Take 50 mg by mouth 2 (two) times daily with a meal.    . pantoprazole (PROTONIX) 40 MG tablet TAKE 1 TABLET BY MOUTH EVERY DAY 30 tablet 1  . predniSONE (DELTASONE) 20 MG tablet 4 tablet p.o. daily for 1 week followed by 3 tablet p.o. daily for 1 week followed by 2 tablet p.o. daily for 1 week followed by 1 tablet p.o. daily for 1 week followed by 0.5 tablet p.o. daily for 1 week 74 tablet 0  . XARELTO 20 MG TABS tablet Take 20 mg by mouth daily in the afternoon.      No current facility-administered medications for this visit.    Allergies as of 10/08/2020  . (  No Known Allergies)    Family History  Problem Relation Age of Onset  . Dementia Mother   . Alzheimer's disease Mother   . Hypertension Mother   . Stroke Father   . Clotting disorder Father   . Esophageal cancer Neg Hx   . Stomach cancer Neg Hx   . Rectal cancer Neg Hx   . Colon polyps Neg Hx     Social History   Socioeconomic History  . Marital status: Married    Spouse name: Not on file  . Number of children: 2  . Years of education: Not on file  . Highest education level: Not on file  Occupational History  . Occupation: retired  Tobacco Use  . Smoking status:  Former Smoker    Packs/day: 0.50    Years: 45.00    Pack years: 22.50    Types: Cigarettes  . Smokeless tobacco: Never Used  Vaping Use  . Vaping Use: Never used  Substance and Sexual Activity  . Alcohol use: Yes    Alcohol/week: 50.0 standard drinks    Types: 50 Cans of beer per week  . Drug use: No  . Sexual activity: Not on file  Other Topics Concern  . Not on file  Social History Narrative  . Not on file   Social Determinants of Health   Financial Resource Strain: Not on file  Food Insecurity: Not on file  Transportation Needs: Not on file  Physical Activity: Not on file  Stress: Not on file  Social Connections: Not on file  Intimate Partner Violence: Not on file    Review of Systems:    Constitutional: No weight loss, fever or chills Cardiovascular: No chest pain   Respiratory: No SOB  Gastrointestinal: See HPI and otherwise negative   Physical Exam:  Vital signs: BP 102/68   Pulse (!) 58   Ht 6\' 2"  (1.88 m)   Wt 156 lb 12.8 oz (71.1 kg)   SpO2 98%   BMI 20.13 kg/m   Constitutional:   Pleasant Caucasian male appears to be in NAD, Well developed, Well nourished, alert and cooperative Respiratory: Respirations even and unlabored. Lungs clear to auscultation bilaterally.   No wheezes, crackles, or rhonchi.  Cardiovascular: Normal S1, S2. No MRG. Regular rate and rhythm. No peripheral edema, cyanosis or pallor.  Gastrointestinal:  Soft, nondistended, nontender. No rebound or guarding. Normal bowel sounds. No appreciable masses or hepatomegaly. Rectal:  Not performed.  Psychiatric: Oriented to person, place and time. Demonstrates good judgement and reason without abnormal affect or behaviors.  RELEVANT LABS AND IMAGING: CBC    Component Value Date/Time   WBC 4.7 09/12/2020 0916   WBC 6.3 08/18/2020 1130   RBC 4.37 09/12/2020 0916   HGB 14.7 09/12/2020 0916   HGB 15.2 11/24/2018 1520   HCT 41.1 09/12/2020 0916   HCT 43.5 11/24/2018 1520   PLT 208  09/12/2020 0916   PLT 221 11/24/2018 1520   MCV 94.1 09/12/2020 0916   MCV 102 (H) 11/24/2018 1520   MCH 33.6 09/12/2020 0916   MCHC 35.8 09/12/2020 0916   RDW 12.8 09/12/2020 0916   RDW 12.6 11/24/2018 1520   LYMPHSABS 0.7 09/12/2020 0916   LYMPHSABS 1.5 01/25/2018 1636   MONOABS 0.6 09/12/2020 0916   EOSABS 0.1 09/12/2020 0916   EOSABS 0.0 01/25/2018 1636   BASOSABS 0.0 09/12/2020 0916   BASOSABS 0.0 01/25/2018 1636    CMP     Component Value Date/Time   NA 132 (L)  09/12/2020 0916   NA 129 (L) 11/24/2018 1520   K 4.5 09/12/2020 0916   CL 100 09/12/2020 0916   CO2 18 (L) 09/12/2020 0916   GLUCOSE 112 (H) 09/12/2020 0916   BUN 6 (L) 09/12/2020 0916   BUN 8 11/24/2018 1520   CREATININE 0.76 09/12/2020 0916   CALCIUM 9.0 09/12/2020 0916   PROT 7.6 09/12/2020 0916   PROT 7.2 02/15/2017 1639   ALBUMIN 3.8 09/12/2020 0916   ALBUMIN 4.4 10/03/2017 1650   AST 25 09/12/2020 0916   ALT 8 09/12/2020 0916   ALKPHOS 64 09/12/2020 0916   BILITOT 0.6 09/12/2020 0916   GFRNONAA >60 09/12/2020 0916   GFRAA >60 02/19/2020 1403    Assessment: 1.  Abnormal liver biopsy: Question of biliary cholangitis, which Dr. Julien Nordmann thinks can sometimes be caused by the chemo patient is on.  He was started on prednisone taper with 1 week left, LFTs are completely normal, patient with no symptoms  Plan: 1.  Discussed with patient that I do not believe there is anything else further that we need to do, especially since he has no lab abnormalities.  Explained that I would let Dr. Tarri Glenn review his chart to make sure she does not recommend anything else. 2.  If anything could consider imaging of the liver specifically, possibly MRI/MRCP? 3.  Patient to follow in clinic per recommendations from Dr. Tarri Glenn after time of review.  Christopher Newer, PA-C [facility] 10/08/2020, 2:26 PM  Cc: Lujean Amel, MD

## 2020-10-09 ENCOUNTER — Telehealth: Payer: Self-pay | Admitting: Medical Oncology

## 2020-10-09 ENCOUNTER — Other Ambulatory Visit: Payer: Self-pay | Admitting: Internal Medicine

## 2020-10-09 NOTE — Progress Notes (Signed)
Metastatic non-small cell lung cancer who initially presented with right upper lobe lung mass with direct extension across the major fissure into the superior segment of the right lower lobe and ipsilateral hilar lymphadenopathy as well as questionable subcarinal lymphadenopathy. He completed a course of concurrent chemoradiation with weekly carboplatin and paclitaxel status post 7 cycles and is currently undergoing a course of consolidation treatment with immunotherapy with Imfinzi 1500 mg IV every 4 weeks status post 6 cycles Treatment has been on hold for several weeks due to a suspicious lesion in the liver. Liver enzymes are normal. Last CT otherwise showed no concerning findings or progression in the chest. Liver biopsy of the 3.6cm mass with CT features of metastasis were negative for malignancy but suggested a possible biliary process. The slides were sent to [facility] pathology for a second opinion. Those results are not available to me today. He was started on steroids for possible immunotherapy-mediated cholangitis.  I will ask Dr. Julien Nordmann for details about the second opinion. Could consider MRI with [facility] to further characterize the liver lesion.

## 2020-10-09 NOTE — Telephone Encounter (Signed)
Pts path report is not completed . [facility] is waiting on additional block and slides from [place]. [place] confirmed they received [facility] request today.

## 2020-10-09 NOTE — Progress Notes (Signed)
Do you have access to the [facility] pathology report re: Christopher Harding liver biopsy? Thank you.

## 2020-10-14 NOTE — Progress Notes (Signed)
Still awaiting pathology report.

## 2020-10-15 ENCOUNTER — Telehealth: Payer: Self-pay

## 2020-10-15 NOTE — Telephone Encounter (Signed)
Staff message sent to Abelina Bachelor, RN at Oncology to follow up on this request. Will await response.

## 2020-10-15 NOTE — Telephone Encounter (Signed)
-----   Message from Thornton Park, MD sent at 10/14/2020  1:38 PM EDT ----- Any success with the pathology report from [facility]?  ----- Message ----- From: Levin Erp, PA Sent: 10/08/2020   2:55 PM EDT To: Thornton Park, MD

## 2020-10-16 ENCOUNTER — Inpatient Hospital Stay: Payer: Medicare Other

## 2020-10-16 ENCOUNTER — Inpatient Hospital Stay: Payer: Medicare Other | Attending: Physician Assistant | Admitting: Internal Medicine

## 2020-10-16 NOTE — Telephone Encounter (Signed)
Pt did not show up for labs and appt today. I left VM to return my call to r/s.  Path update- Per [facility] path dept  Kevan Ny). [facility] told him today that pt  path report  should be finished in  " a few more days".

## 2020-10-22 ENCOUNTER — Ambulatory Visit: Payer: Medicare Other

## 2020-10-22 ENCOUNTER — Ambulatory Visit: Payer: Medicare Other | Admitting: Internal Medicine

## 2020-10-22 ENCOUNTER — Other Ambulatory Visit: Payer: Medicare Other

## 2020-10-22 ENCOUNTER — Other Ambulatory Visit: Payer: Self-pay | Admitting: Internal Medicine

## 2020-10-22 NOTE — Telephone Encounter (Signed)
Diane, just wanted to follow up and see if the [facility] pathology report was received? Thanks

## 2020-10-23 ENCOUNTER — Telehealth: Payer: Self-pay | Admitting: Medical Oncology

## 2020-10-23 NOTE — Telephone Encounter (Signed)
Pt did not know about appt on Monday 05/16. When does he need to return and does pt need a scan before his next visit?

## 2020-10-24 ENCOUNTER — Other Ambulatory Visit: Payer: Self-pay | Admitting: Physician Assistant

## 2020-10-24 ENCOUNTER — Telehealth: Payer: Self-pay | Admitting: Internal Medicine

## 2020-10-24 ENCOUNTER — Telehealth: Payer: Self-pay

## 2020-10-24 DIAGNOSIS — C3490 Malignant neoplasm of unspecified part of unspecified bronchus or lung: Secondary | ICD-10-CM

## 2020-10-24 NOTE — Telephone Encounter (Signed)
Called pt to r/s appt per 5/20 sch msg. No answer. Left msg for pt to call back to r/s appt.

## 2020-10-24 NOTE — Telephone Encounter (Signed)
Dr. Tarri Glenn would like to be updated once you all receive the pathology report. Thanks

## 2020-10-24 NOTE — Telephone Encounter (Signed)
Pts wife called stating the pt is having on-going left side pain that is dull and achy and the pt doesn't want to tell this to his providers.  Per review of pts visit notes, pt was referred to GI given his "final pathology read at [facility] in addition to [facility] showed no evidence of malignancy and there was suspicious inflammatory process of biliary origin". 10/08/20 note from GI suggests maybe and MRI or MRCP. Pts wife was advised to also contact them with regard to this.  Dr. Deanna Artis PA-C have also ordered a chest, abdomen and pelvis CT w/contrast. I connected her with Radiology scheduling to have them scheduled and advised she can schedule them sooner but the pt is going out of town next week. Per the appt screen, she as scheduled it for June 2nd. I have also sent a scheduling message to move pts 6/9 appt with Dr. Julien Nordmann to a little sooner, maybe June 6th if that date is available and the pt is agreeable.

## 2020-10-27 ENCOUNTER — Telehealth: Payer: Self-pay | Admitting: Internal Medicine

## 2020-10-27 NOTE — Telephone Encounter (Signed)
Scheduled appts per 5/20 sch msg. Pt's wife is aware.

## 2020-10-27 NOTE — Telephone Encounter (Signed)
Ardeen Garland, RN  Yevette Edwards, RN Caller: Unspecified (1 week ago) I just saw this in [facility] note on April 8.   "The patient underwent ultrasound-guided core biopsy of the liver lesion and the pathology done at [facility] and consultation with [facility] showed no evidence for malignancy but likely inflammatory process possibly a biliary process."   Dr. Tarri Glenn, please see note from oncology. Thanks

## 2020-10-28 NOTE — Telephone Encounter (Signed)
Thanks, I did see that a few weeks ago. I was looking for the formal report - as it should have considerable more detail than that. Thanks.

## 2020-10-30 NOTE — Telephone Encounter (Signed)
[facility] pathology [phone]), I spoke with [facility] in surgical pathology. Holley Raring states that the pathology report is still pending and they have not received the additional block yet. I called to [facility] and spoke with [place] who stated that Block A3 was shipped on 10/09/20. Shipping #[phone]). TJ called [facility] as well and confirmed that the additional block was received and that they are still waiting on some additional stains. Report is still pending.

## 2020-11-06 ENCOUNTER — Encounter (HOSPITAL_COMMUNITY): Payer: Self-pay

## 2020-11-06 ENCOUNTER — Inpatient Hospital Stay: Payer: Medicare Other | Attending: Physician Assistant

## 2020-11-06 ENCOUNTER — Other Ambulatory Visit: Payer: Self-pay

## 2020-11-06 ENCOUNTER — Ambulatory Visit (HOSPITAL_COMMUNITY)
Admission: RE | Admit: 2020-11-06 | Discharge: 2020-11-06 | Disposition: A | Discharge status: Home / Self Care | Payer: Medicare Other | Source: Ambulatory Visit | Attending: Physician Assistant | Admitting: Physician Assistant

## 2020-11-06 DIAGNOSIS — C3490 Malignant neoplasm of unspecified part of unspecified bronchus or lung: Secondary | ICD-10-CM | POA: Insufficient documentation

## 2020-11-06 DIAGNOSIS — C787 Secondary malignant neoplasm of liver and intrahepatic bile duct: Secondary | ICD-10-CM | POA: Insufficient documentation

## 2020-11-06 DIAGNOSIS — R590 Localized enlarged lymph nodes: Secondary | ICD-10-CM | POA: Diagnosis not present

## 2020-11-06 DIAGNOSIS — C3431 Malignant neoplasm of lower lobe, right bronchus or lung: Secondary | ICD-10-CM | POA: Insufficient documentation

## 2020-11-06 DIAGNOSIS — C3411 Malignant neoplasm of upper lobe, right bronchus or lung: Secondary | ICD-10-CM | POA: Diagnosis present

## 2020-11-06 DIAGNOSIS — C3491 Malignant neoplasm of unspecified part of right bronchus or lung: Secondary | ICD-10-CM

## 2020-11-06 DIAGNOSIS — Z79899 Other long term (current) drug therapy: Secondary | ICD-10-CM | POA: Insufficient documentation

## 2020-11-06 DIAGNOSIS — N3289 Other specified disorders of bladder: Secondary | ICD-10-CM | POA: Diagnosis not present

## 2020-11-06 DIAGNOSIS — Z923 Personal history of irradiation: Secondary | ICD-10-CM | POA: Diagnosis not present

## 2020-11-06 DIAGNOSIS — I700 Atherosclerosis of aorta: Secondary | ICD-10-CM | POA: Diagnosis not present

## 2020-11-06 DIAGNOSIS — K5750 Diverticulosis of both small and large intestine without perforation or abscess without bleeding: Secondary | ICD-10-CM | POA: Diagnosis not present

## 2020-11-06 DIAGNOSIS — K7689 Other specified diseases of liver: Secondary | ICD-10-CM | POA: Diagnosis not present

## 2020-11-06 DIAGNOSIS — Z9221 Personal history of antineoplastic chemotherapy: Secondary | ICD-10-CM | POA: Diagnosis not present

## 2020-11-06 LAB — CBC WITH DIFFERENTIAL (CANCER CENTER ONLY)
Abs Immature Granulocytes: 0.04 10*3/uL (ref 0.00–0.07)
Basophils Absolute: 0.1 10*3/uL (ref 0.0–0.1)
Basophils Relative: 1 %
Eosinophils Absolute: 0 10*3/uL (ref 0.0–0.5)
Eosinophils Relative: 0 %
HCT: 37.4 % — ABNORMAL LOW (ref 39.0–52.0)
Hemoglobin: 13.3 g/dL (ref 13.0–17.0)
Immature Granulocytes: 1 %
Lymphocytes Relative: 10 %
Lymphs Abs: 0.6 10*3/uL — ABNORMAL LOW (ref 0.7–4.0)
MCH: 33.4 pg (ref 26.0–34.0)
MCHC: 35.6 g/dL (ref 30.0–36.0)
MCV: 94 fL (ref 80.0–100.0)
Monocytes Absolute: 0.6 10*3/uL (ref 0.1–1.0)
Monocytes Relative: 10 %
Neutro Abs: 4.5 10*3/uL (ref 1.7–7.7)
Neutrophils Relative %: 78 %
Platelet Count: 267 10*3/uL (ref 150–400)
RBC: 3.98 MIL/uL — ABNORMAL LOW (ref 4.22–5.81)
RDW: 12.8 % (ref 11.5–15.5)
WBC Count: 5.7 10*3/uL (ref 4.0–10.5)
nRBC: 0 % (ref 0.0–0.2)

## 2020-11-06 LAB — CMP (CANCER CENTER ONLY)
ALT: 7 U/L (ref 0–44)
AST: 26 U/L (ref 15–41)
Albumin: 3.3 g/dL — ABNORMAL LOW (ref 3.5–5.0)
Alkaline Phosphatase: 105 U/L (ref 38–126)
Anion gap: 14 (ref 5–15)
BUN: <4 mg/dL — ABNORMAL LOW (ref 8–23)
CO2: 21 mmol/L — ABNORMAL LOW (ref 22–32)
Calcium: 9.2 mg/dL (ref 8.9–10.3)
Chloride: 98 mmol/L (ref 98–111)
Creatinine: 0.69 mg/dL (ref 0.61–1.24)
GFR, Estimated: >60 mL/min (ref >60)
Glucose, Bld: 84 mg/dL (ref 70–99)
Potassium: 4.2 mmol/L (ref 3.5–5.1)
Sodium: 133 mmol/L — ABNORMAL LOW (ref 135–145)
Total Bilirubin: 0.4 mg/dL (ref 0.3–1.2)
Total Protein: 7.2 g/dL (ref 6.5–8.1)

## 2020-11-06 LAB — SURGICAL PATHOLOGY

## 2020-11-06 LAB — TSH: TSH: 1.095 u[IU]/mL (ref 0.320–4.118)

## 2020-11-06 IMAGING — CT CT ABD-PELV W/ CM
2 of 5 series · 12 of 36 positions shown, 15 images · IV contrast (OMNIPAQUE)
Comparison: CT chest, [DATE], PET-CT, [DATE]

CLINICAL DATA: Metastatic lung cancer restaging

EXAM:
CT CHEST, ABDOMEN, AND PELVIS WITH CONTRAST
TECHNIQUE: Multidetector CT imaging of the chest, abdomen and pelvis was
performed following the standard protocol during bolus
administration of intravenous contrast.
CONTRAST:  100mL OMNIPAQUE IOHEXOL 300 MG/ML SOLN, additional oral
enteric contrast

[Series 2: cap with · axial · 0.77mm/px · z∈[-626,-76]mm · 9 of 138 slices shown, 12 images]
[im 14/138  mediastinal]
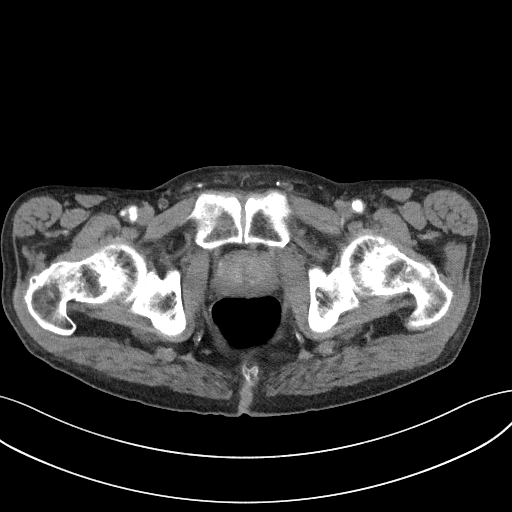
[im 14/138  lung]
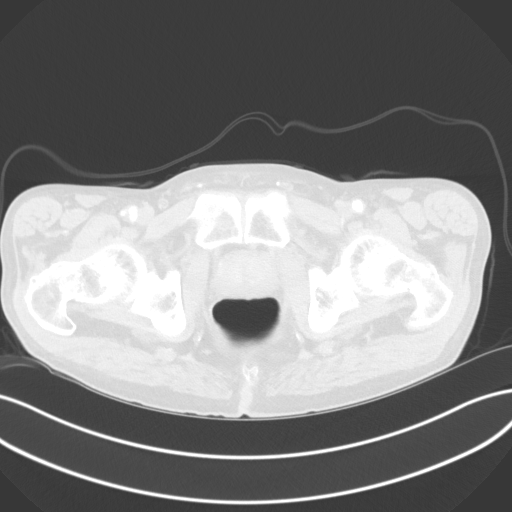
[im 28/138  lung]
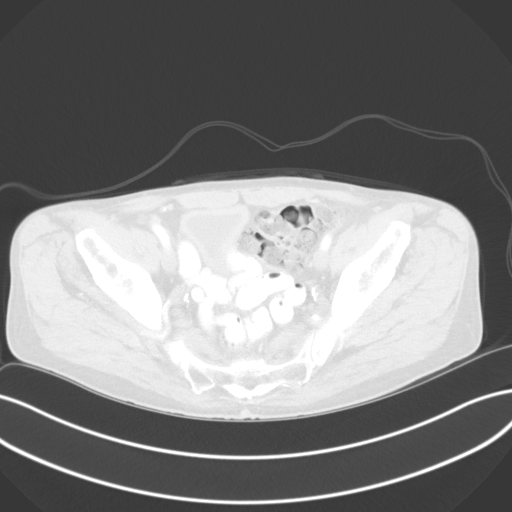
[im 42/138  lung]
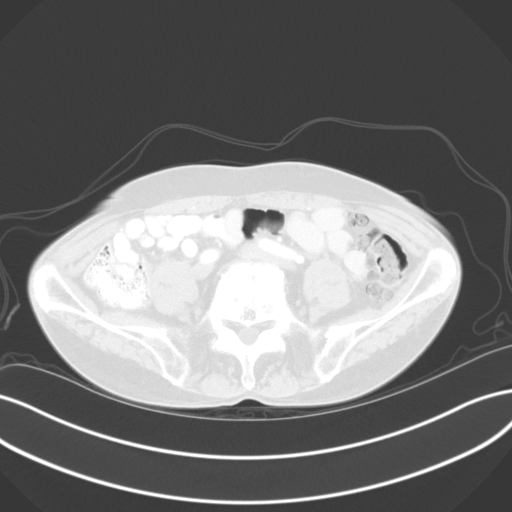
[im 55/138  lung]
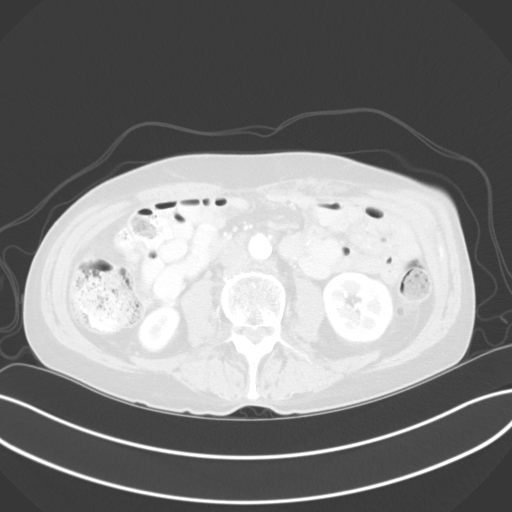
[im 69/138  mediastinal]
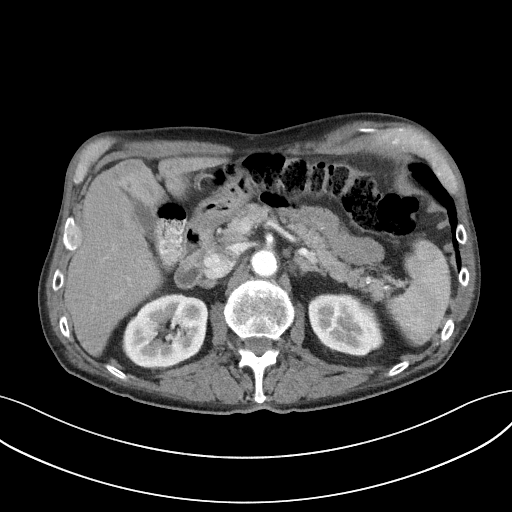
[im 69/138  lung]
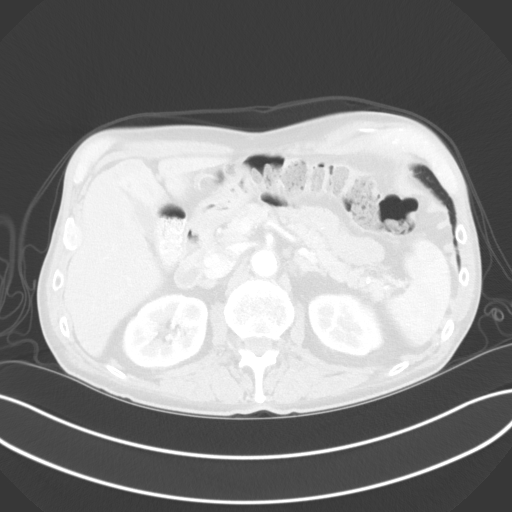
[im 83/138  lung]
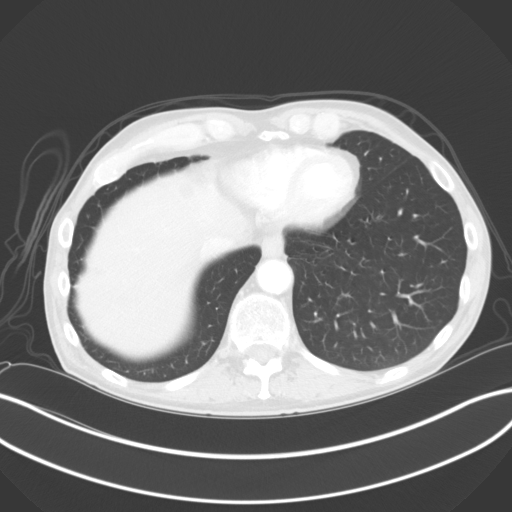
[im 96/138  lung]
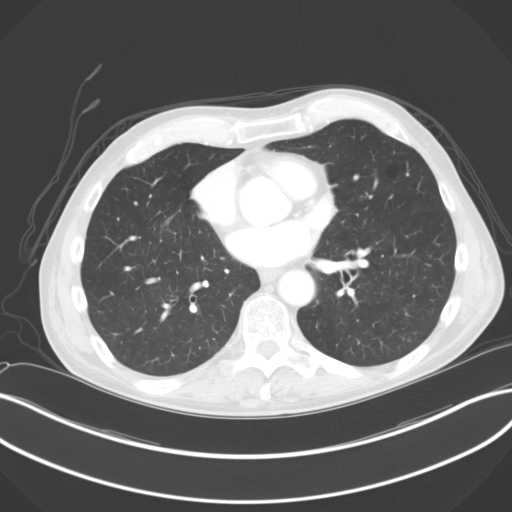
[im 110/138  lung]
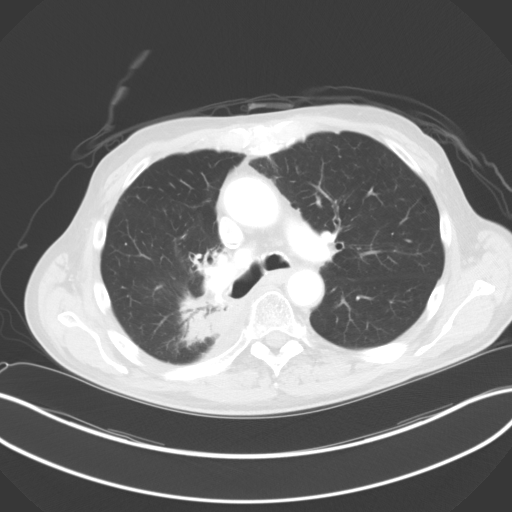
[im 124/138  mediastinal]
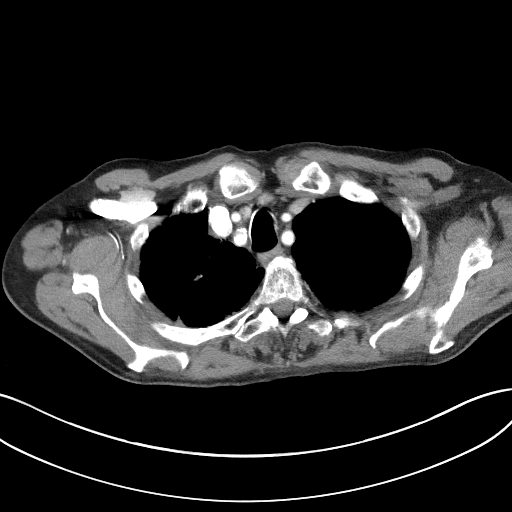
[im 124/138  lung]
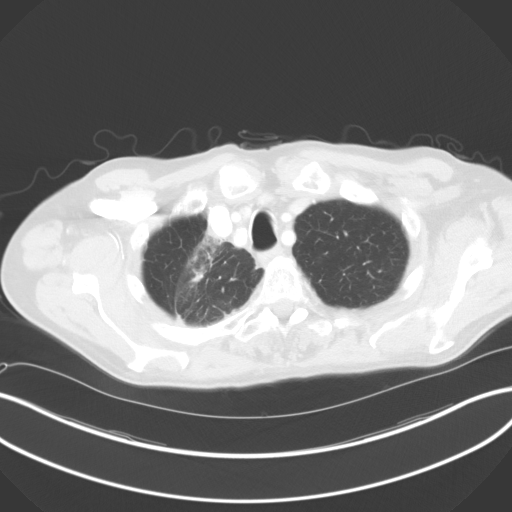

[Series 4: coronals · coronal · 0.84mm/px · 3 of 145 slices shown]
[im 29/145  lung]
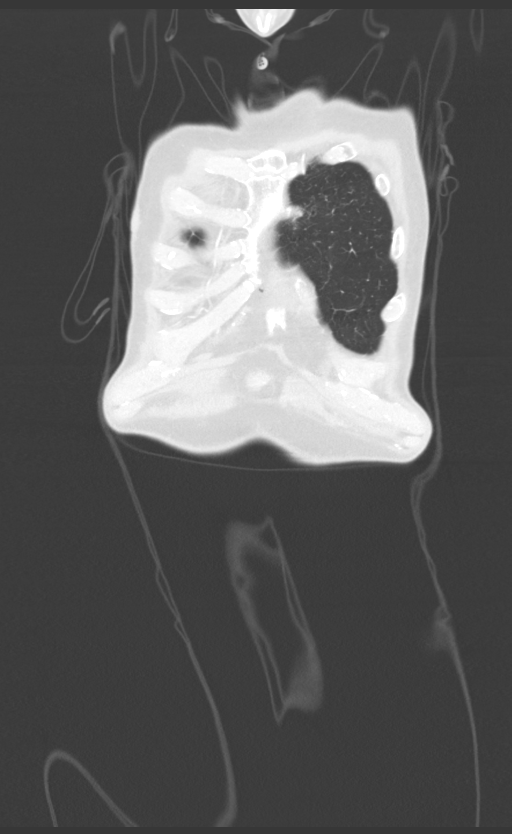
[im 58/145  lung]
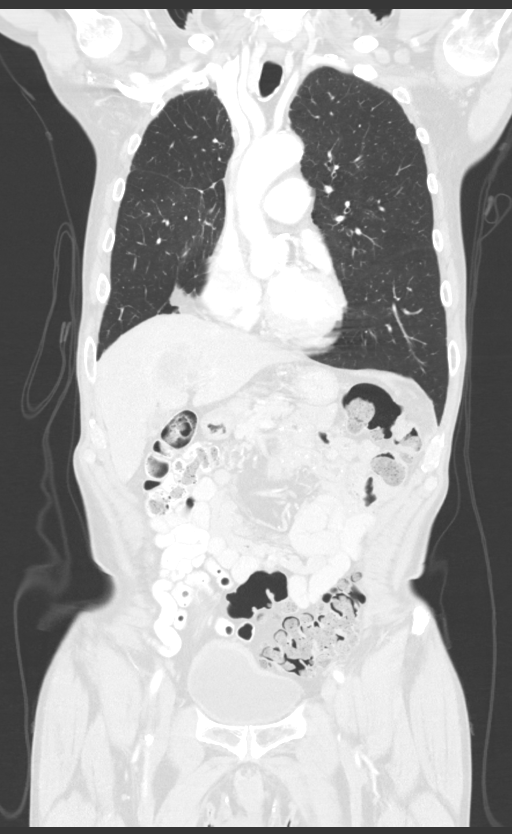
[im 87/145  lung]
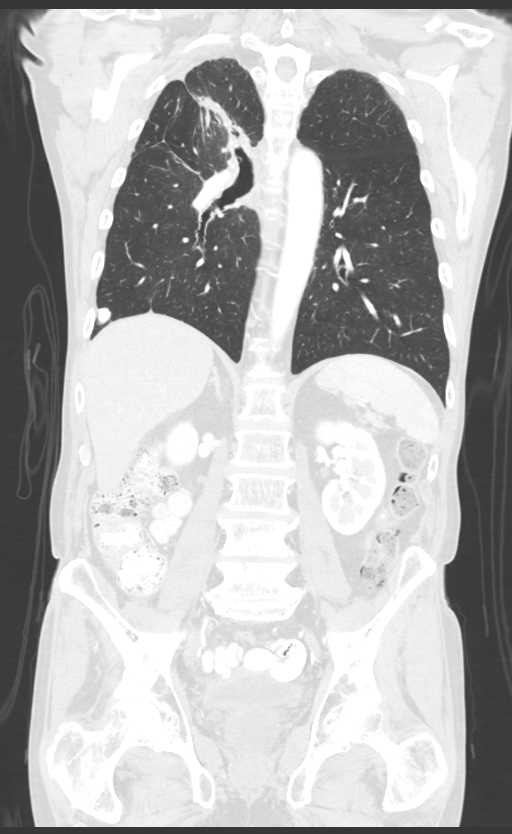

[12 of 36 positions shown; findings below may reference images not displayed]

FINDINGS: CT CHEST FINDINGS

Cardiovascular: Aortic atherosclerosis. Normal heart size. Scattered
left coronary artery calcifications. No pericardial effusion.

Mediastinum/Nodes: No enlarged mediastinal, hilar, or axillary lymph
nodes. Thyroid gland, trachea, and esophagus demonstrate no
significant findings.

Lungs/Pleura: No significant interval change in post treatment
appearance of the right chest, with dense, bandlike consolidation,
fibrosis, and volume loss of the perihilar right lung, particularly
the paramedian right upper lobe (series 6, image 54, 71). Background
of very fine centrilobular nodularity, most concentrated in the lung
apices. No pleural effusion or pneumothorax.

Musculoskeletal: No chest wall mass or suspicious bone lesions
identified.

CT ABDOMEN PELVIS FINDINGS

Hepatobiliary: Marked interval increase in size of a rim enhancing,
hypodense mass of the anterior midline liver, hepatic segment LOCARD/LOCARD,
measuring 6.5 x 5.1 cm, previously 3.7 x 3.3 cm when measured
similarly (series 2, image 63). There is associated segmental
biliary ductal dilatation (series 2, image 60). There is an
additional rim enhancing, internally hypodense lesion of the
inferior tip of the right lobe of the liver, hepatic segment VII,
measuring 2.1 x 2.0 cm (series 2, image 82). This was not previously
included in the field of view on prior imaging of the chest. No
gallstones, gallbladder wall thickening, or biliary dilatation.

Pancreas: Unremarkable. No pancreatic ductal dilatation or
surrounding inflammatory changes.

Spleen: Normal in size without significant abnormality.

Adrenals/Urinary Tract: Adrenal glands are unremarkable. Kidneys are
normal, without renal calculi, solid lesion, or hydronephrosis. Mild
thickening of the urinary bladder wall, likely due to chronic outlet
obstruction.

Stomach/Bowel: Stomach is within normal limits. Appendix appears
normal. No evidence of bowel wall thickening, distention, or
inflammatory changes. Severe descending and sigmoid diverticulosis.

Vascular/Lymphatic: Aortic atherosclerosis. No enlarged abdominal or
pelvic lymph nodes.

Reproductive: Prostatomegaly

Other: No abdominal wall hernia or abnormality. No abdominopelvic
ascites.

Musculoskeletal: No acute or significant osseous findings.
IMPRESSION: 1. Marked interval increase in size of a rim enhancing, hypodense
mass of the anterior midline liver, hepatic segment LOCARD/LOCARD, measuring
6.5 x 5.1 cm, previously 3.7 x 3.3 cm when measured similarly. There
is associated segmental biliary ductal dilatation. Findings are
consistent with worsened hepatic metastatic disease.
2. There is an additional rim enhancing, internally hypodense lesion
of the inferior tip of the right lobe of the liver, hepatic segment
VII, measuring 2.1 x 2.0 cm. This is consistent with an additional
metastasis but was not previously included in the field of view on
prior imaging of the chest.
3. No significant interval change in post treatment appearance of
the right chest, with dense, bandlike consolidation, fibrosis, and
volume loss.
4. Background of very fine centrilobular nodularity, most
concentrated in the lung apices, consistent with smoking-related
respiratory bronchiolitis.
5. Coronary artery disease.

Aortic Atherosclerosis ([0G]-[0G]).

## 2020-11-06 IMAGING — CT CT CHEST W/ CM
2 of 5 series · 12 of 36 positions shown, 15 images · IV contrast (OMNIPAQUE)
Comparison: CT chest, [DATE], PET-CT, [DATE]

CLINICAL DATA: Metastatic lung cancer restaging

EXAM:
CT CHEST, ABDOMEN, AND PELVIS WITH CONTRAST
TECHNIQUE: Multidetector CT imaging of the chest, abdomen and pelvis was
performed following the standard protocol during bolus
administration of intravenous contrast.
CONTRAST:  100mL OMNIPAQUE IOHEXOL 300 MG/ML SOLN, additional oral
enteric contrast

[Series 2: cap with · axial · 0.77mm/px · z∈[-626,-76]mm · 9 of 138 slices shown, 12 images]
[im 14/138  mediastinal]
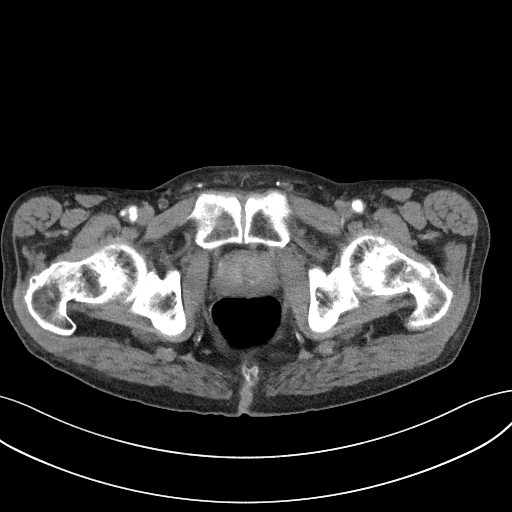
[im 14/138  lung]
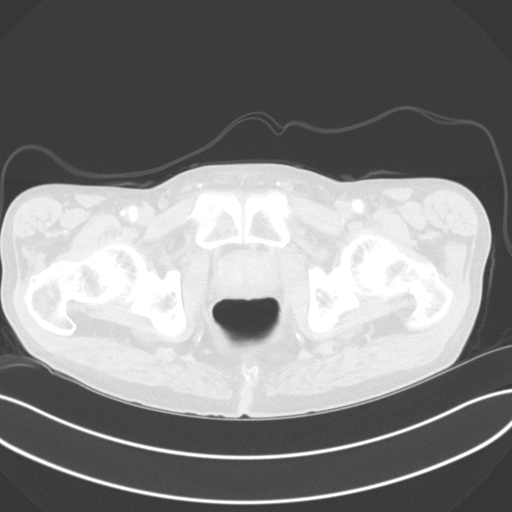
[im 28/138  lung]
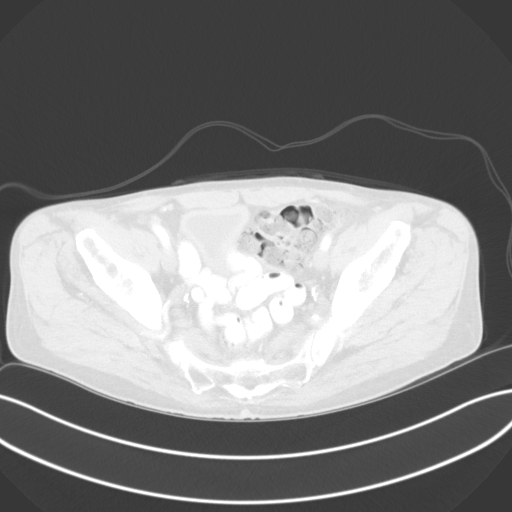
[im 42/138  lung]
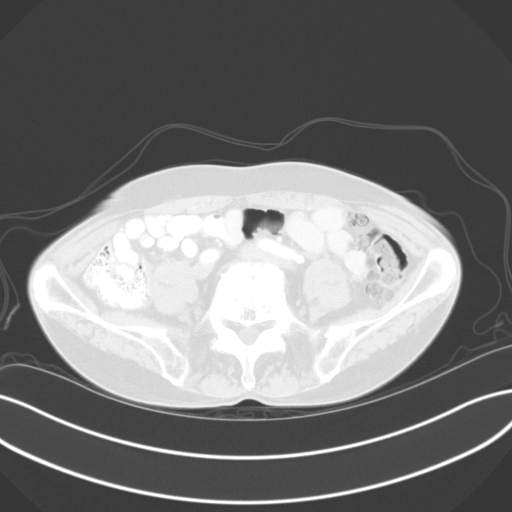
[im 55/138  lung]
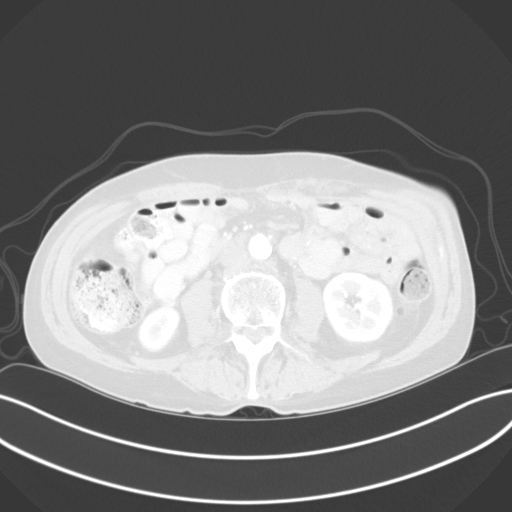
[im 69/138  mediastinal]
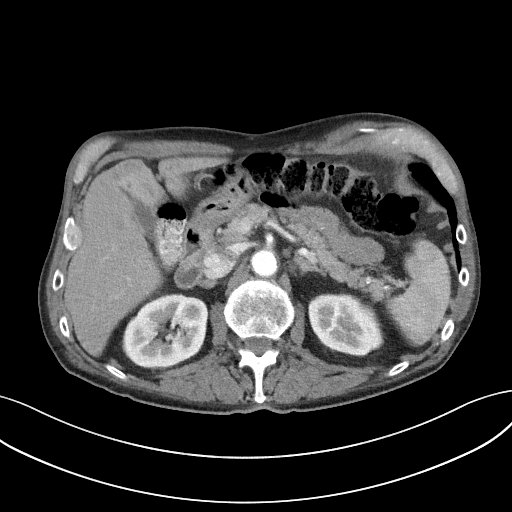
[im 69/138  lung]
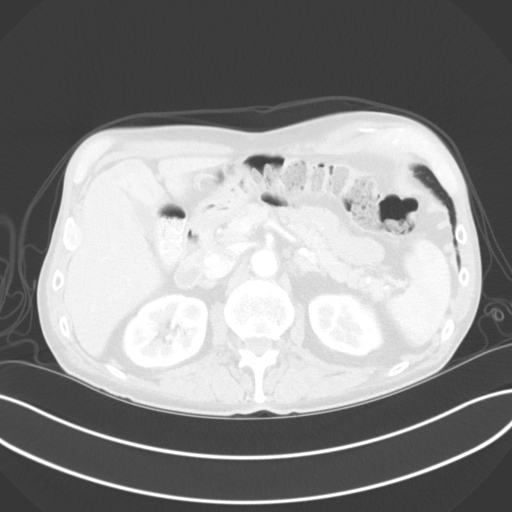
[im 83/138  lung]
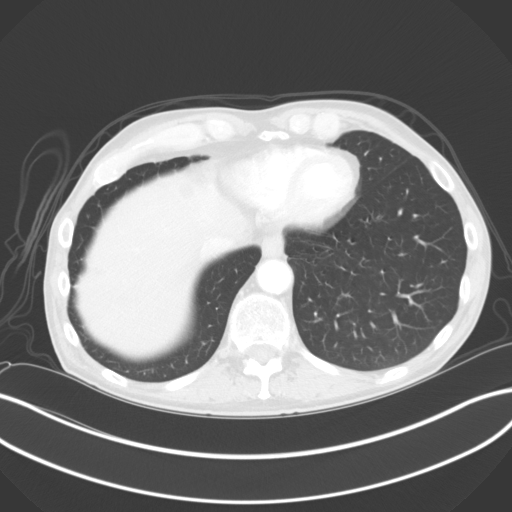
[im 96/138  lung]
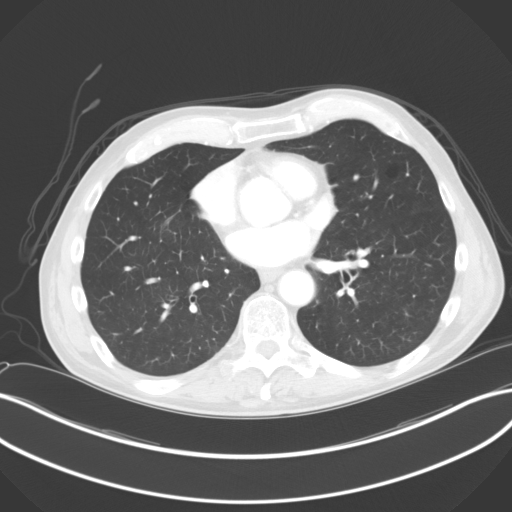
[im 110/138  lung]
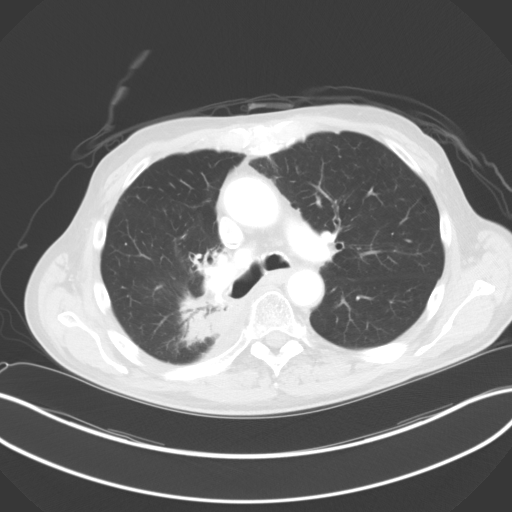
[im 124/138  mediastinal]
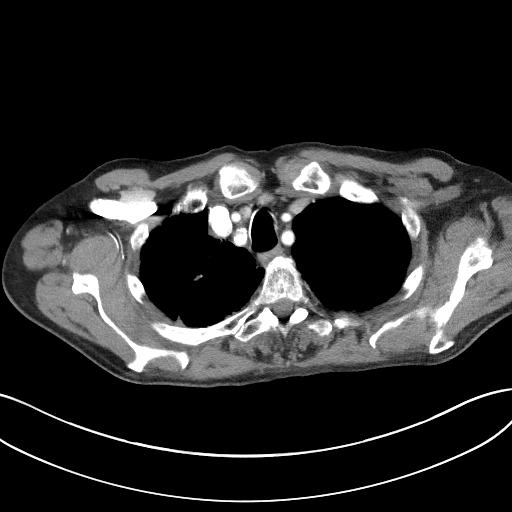
[im 124/138  lung]
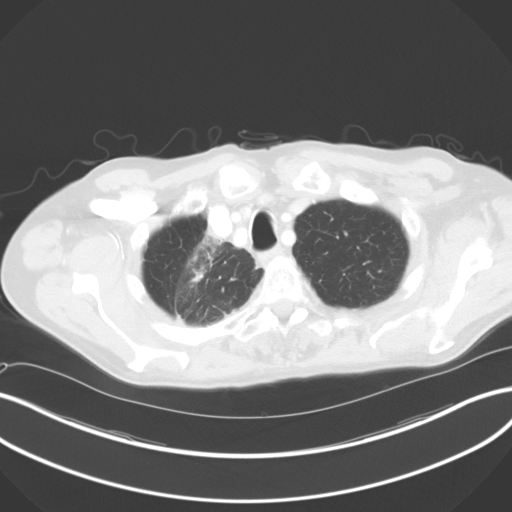

[Series 4: coronals · coronal · 0.84mm/px · 3 of 145 slices shown]
[im 29/145  lung]
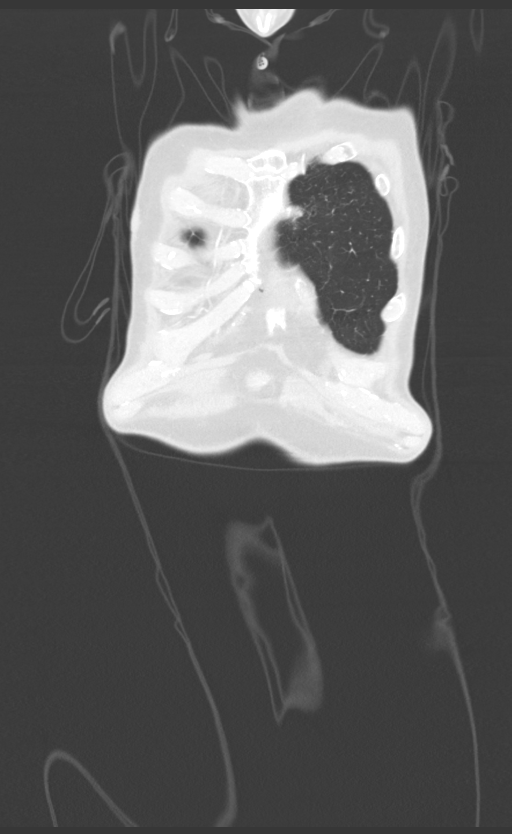
[im 58/145  lung]
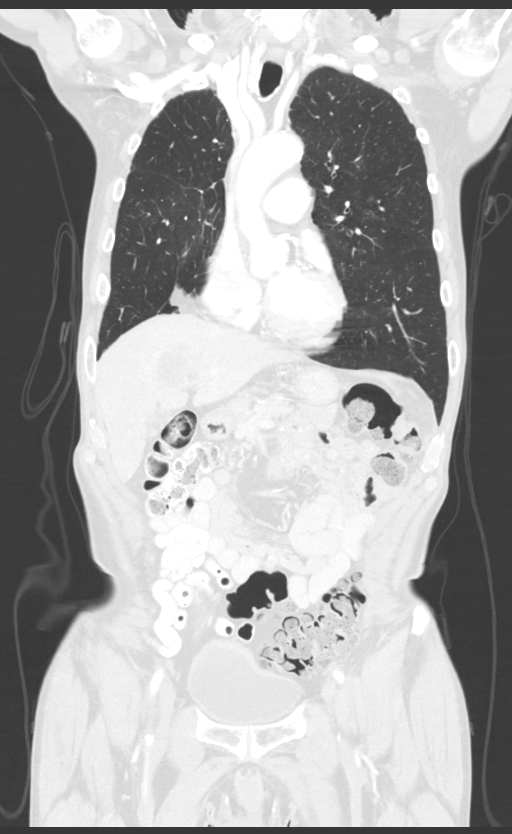
[im 87/145  lung]
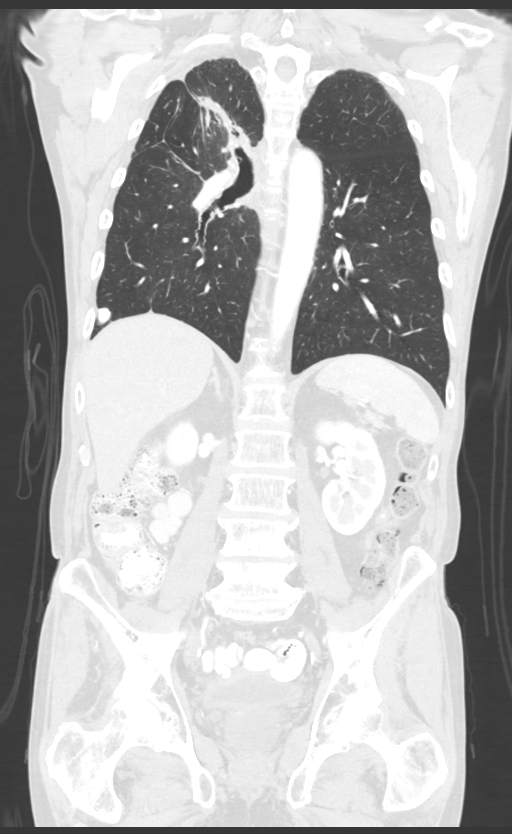

[12 of 36 positions shown; findings below may reference images not displayed]

FINDINGS: CT CHEST FINDINGS

Cardiovascular: Aortic atherosclerosis. Normal heart size. Scattered
left coronary artery calcifications. No pericardial effusion.

Mediastinum/Nodes: No enlarged mediastinal, hilar, or axillary lymph
nodes. Thyroid gland, trachea, and esophagus demonstrate no
significant findings.

Lungs/Pleura: No significant interval change in post treatment
appearance of the right chest, with dense, bandlike consolidation,
fibrosis, and volume loss of the perihilar right lung, particularly
the paramedian right upper lobe (series 6, image 54, 71). Background
of very fine centrilobular nodularity, most concentrated in the lung
apices. No pleural effusion or pneumothorax.

Musculoskeletal: No chest wall mass or suspicious bone lesions
identified.

CT ABDOMEN PELVIS FINDINGS

Hepatobiliary: Marked interval increase in size of a rim enhancing,
hypodense mass of the anterior midline liver, hepatic segment LOCARD/LOCARD,
measuring 6.5 x 5.1 cm, previously 3.7 x 3.3 cm when measured
similarly (series 2, image 63). There is associated segmental
biliary ductal dilatation (series 2, image 60). There is an
additional rim enhancing, internally hypodense lesion of the
inferior tip of the right lobe of the liver, hepatic segment VII,
measuring 2.1 x 2.0 cm (series 2, image 82). This was not previously
included in the field of view on prior imaging of the chest. No
gallstones, gallbladder wall thickening, or biliary dilatation.

Pancreas: Unremarkable. No pancreatic ductal dilatation or
surrounding inflammatory changes.

Spleen: Normal in size without significant abnormality.

Adrenals/Urinary Tract: Adrenal glands are unremarkable. Kidneys are
normal, without renal calculi, solid lesion, or hydronephrosis. Mild
thickening of the urinary bladder wall, likely due to chronic outlet
obstruction.

Stomach/Bowel: Stomach is within normal limits. Appendix appears
normal. No evidence of bowel wall thickening, distention, or
inflammatory changes. Severe descending and sigmoid diverticulosis.

Vascular/Lymphatic: Aortic atherosclerosis. No enlarged abdominal or
pelvic lymph nodes.

Reproductive: Prostatomegaly

Other: No abdominal wall hernia or abnormality. No abdominopelvic
ascites.

Musculoskeletal: No acute or significant osseous findings.
IMPRESSION: 1. Marked interval increase in size of a rim enhancing, hypodense
mass of the anterior midline liver, hepatic segment LOCARD/LOCARD, measuring
6.5 x 5.1 cm, previously 3.7 x 3.3 cm when measured similarly. There
is associated segmental biliary ductal dilatation. Findings are
consistent with worsened hepatic metastatic disease.
2. There is an additional rim enhancing, internally hypodense lesion
of the inferior tip of the right lobe of the liver, hepatic segment
VII, measuring 2.1 x 2.0 cm. This is consistent with an additional
metastasis but was not previously included in the field of view on
prior imaging of the chest.
3. No significant interval change in post treatment appearance of
the right chest, with dense, bandlike consolidation, fibrosis, and
volume loss.
4. Background of very fine centrilobular nodularity, most
concentrated in the lung apices, consistent with smoking-related
respiratory bronchiolitis.
5. Coronary artery disease.

Aortic Atherosclerosis ([0G]-[0G]).

## 2020-11-06 MED ORDER — IOHEXOL 300 MG/ML  SOLN
100.0000 mL | Freq: Once | INTRAMUSCULAR | Status: AC | PRN
  Administered 2020-11-06: 100 mL via INTRAVENOUS

## 2020-11-06 MED ORDER — SODIUM CHLORIDE (PF) 0.9 % IJ SOLN
INTRAMUSCULAR | Status: AC
  Filled 2020-11-06: qty 50

## 2020-11-13 ENCOUNTER — Other Ambulatory Visit: Payer: Self-pay

## 2020-11-13 ENCOUNTER — Inpatient Hospital Stay (HOSPITAL_BASED_OUTPATIENT_CLINIC_OR_DEPARTMENT_OTHER): Payer: Medicare Other | Admitting: Internal Medicine

## 2020-11-13 VITALS — BP 128/81 | HR 66 | Temp 98.0°F | Resp 20 | Ht 74.0 in | Wt 154.7 lb

## 2020-11-13 DIAGNOSIS — C3491 Malignant neoplasm of unspecified part of right bronchus or lung: Secondary | ICD-10-CM

## 2020-11-13 DIAGNOSIS — C3431 Malignant neoplasm of lower lobe, right bronchus or lung: Secondary | ICD-10-CM | POA: Diagnosis not present

## 2020-11-13 DIAGNOSIS — I10 Essential (primary) hypertension: Secondary | ICD-10-CM

## 2020-11-13 DIAGNOSIS — C3411 Malignant neoplasm of upper lobe, right bronchus or lung: Secondary | ICD-10-CM | POA: Diagnosis not present

## 2020-11-13 DIAGNOSIS — R590 Localized enlarged lymph nodes: Secondary | ICD-10-CM | POA: Diagnosis not present

## 2020-11-13 DIAGNOSIS — C787 Secondary malignant neoplasm of liver and intrahepatic bile duct: Secondary | ICD-10-CM | POA: Diagnosis not present

## 2020-11-13 DIAGNOSIS — Z9221 Personal history of antineoplastic chemotherapy: Secondary | ICD-10-CM | POA: Diagnosis not present

## 2020-11-13 DIAGNOSIS — Z79899 Other long term (current) drug therapy: Secondary | ICD-10-CM | POA: Diagnosis not present

## 2020-11-13 DIAGNOSIS — Z923 Personal history of irradiation: Secondary | ICD-10-CM | POA: Diagnosis not present

## 2020-11-13 NOTE — Progress Notes (Signed)
[place] Telephone:([phone]   [phone]  OFFICE PROGRESS NOTE  Koirala, Dibas, MD [address] [place] [address]  DIAGNOSIS:  Metastatic non-small cell lung cancer initially diagnosed as stage IIIA/B (T3, N1/N2, M0) non-small cell lung cancer, adenocarcinoma.  He presented with a right upper lobe lung mass with direct extension across the major fissure into the superior segment of the right lower lobe and ipsilateral hilar lymphadenopathy.  There is questionable subcarinal lymphadenopathy versus esophageal hypermetabolism this was evaluated by gastroenterology with upper endoscopy on 01/30/2020 and there was no mass lesion or malignancy in that area. The patient was diagnosed in May 2021.    Biomarker Findings Microsatellite status - MS-Stable Tumor Mutational Burden - 9 Muts/Mb Genomic Findings For a complete list of the genes assayed, please refer to the Appendix. KRAS G12C CDKN2A/B p16INK4a S12* NKX2-1 amplification - equivocal? PMS2 R3Q TP53 P83f*38 7 Disease relevant genes with no reportable alterations: ALK, BRAF, EGFR, ERBB2, MET, RET, ROS1   PDL1: 70%   PRIOR THERAPY:  1) Weekly concurrent chemoradiation with carboplatin for an AUC of 2 and paclitaxel 45 mg/m.  First dose expected on 11/26/2019. Status post 7 cycles.   Last dose was given 01/07/2020 with partial response. 2) Consolidation treatment with immunotherapy with Imfinzi 1500 mg IV every 4 weeks.  First dose 02/19/2020. Status post 6 cycles.  This was discontinued secondary to suspicious immunotherapy mediated cholangitis    CURRENT THERAPY:  Observation.  INTERVAL HISTORY: Christopher LOVINGS89y.o. male returns to the clinic today for follow-up visit accompanied by his wife.  The patient is feeling fine today with no concerning complaints except for the persistent fatigue and lack of appetite.  He lost around 3 pounds since his last visit.  He denied having  any current chest pain, shortness of breath, cough or hemoptysis.  He denied having any nausea, vomiting, diarrhea or constipation.  He has no headache or visual changes.  His previous ultrasound-guided biopsy of the liver was suspicious for inflammatory process.  The patient is currently on observation and he had repeat CT scan of the chest performed recently and is here for evaluation and discussion of his scan results.  MEDICAL HISTORY: Past Medical History:  Diagnosis Date   A-fib ([facility]    s/p DCCV 12/07/18   Cancer ([facility]    Depression    situational   Dysrhythmia    Hypertension     ALLERGIES:  has No Known Allergies.  MEDICATIONS:  Current Outpatient Medications  Medication Sig Dispense Refill   metoprolol tartrate (LOPRESSOR) 50 MG tablet Take 50 mg by mouth 2 (two) times daily with a meal.     pantoprazole (PROTONIX) 40 MG tablet TAKE 1 TABLET BY MOUTH EVERY DAY 90 tablet 1   XARELTO 20 MG TABS tablet Take 20 mg by mouth daily in the afternoon.      predniSONE (DELTASONE) 20 MG tablet 4 tablet p.o. daily for 1 week followed by 3 tablet p.o. daily for 1 week followed by 2 tablet p.o. daily for 1 week followed by 1 tablet p.o. daily for 1 week followed by 0.5 tablet p.o. daily for 1 week 74 tablet 0   No current facility-administered medications for this visit.    SURGICAL HISTORY:  Past Surgical History:  Procedure Laterality Date   CARDIOVERSION N/A 12/07/2018   Procedure: CARDIOVERSION;  Surgeon: NJosue Hector MD;  [place]  Service: Cardiovascular;  Laterality: N/A;   FINE NEEDLE ASPIRATION  10/30/2019   Procedure: FINE NEEDLE ASPIRATION (FNA) LINEAR;  Surgeon: Candee Furbish, MD;  Location: [facility] ENDOSCOPY;  Service: Pulmonary;;   INGUINAL HERNIA REPAIR Right 01/25/2019   Procedure: RIGHT INGUINAL HERNIA REPAIR WITH MESH;  Surgeon: Coralie Keens, MD;  Location: [place];  Service: General;  Laterality: Right;  TAP BLOCK   INSERTION OF MESH Right 01/25/2019    Procedure: Insertion Of Mesh;  Surgeon: Coralie Keens, MD;  Location: [place];  Service: General;  Laterality: Right;   VIDEO BRONCHOSCOPY WITH ENDOBRONCHIAL ULTRASOUND N/A 10/30/2019   Procedure: VIDEO BRONCHOSCOPY WITH ENDOBRONCHIAL ULTRASOUND;  Surgeon: Candee Furbish, MD;  Location: [facility] ENDOSCOPY;  Service: Pulmonary;  Laterality: N/A;    REVIEW OF SYSTEMS:  Constitutional: positive for anorexia, fatigue, and weight loss Eyes: negative Ears, nose, mouth, throat, and face: negative Respiratory: negative Cardiovascular: negative Gastrointestinal: negative Genitourinary:negative Integument/breast: negative Hematologic/lymphatic: negative Musculoskeletal:negative Neurological: negative Behavioral/Psych: negative Endocrine: negative Allergic/Immunologic: negative   PHYSICAL EXAMINATION: General appearance: alert, cooperative, fatigued, and no distress Head: Normocephalic, without obvious abnormality, atraumatic Neck: no adenopathy, no JVD, supple, symmetrical, trachea midline, and thyroid not enlarged, symmetric, no tenderness/mass/nodules Lymph nodes: Cervical, supraclavicular, and axillary nodes normal. Resp: clear to auscultation bilaterally Back: symmetric, no curvature. ROM normal. No CVA tenderness. Cardio: regular rate and rhythm, S1, S2 normal, no murmur, click, rub or gallop GI: soft, non-tender; bowel sounds normal; no masses,  no organomegaly Extremities: extremities normal, atraumatic, no cyanosis or edema Neurologic: Alert and oriented X 3, normal strength and tone. Normal symmetric reflexes. Normal coordination and gait  ECOG PERFORMANCE STATUS: 1 - Symptomatic but completely ambulatory  Blood pressure 128/81, pulse 66, temperature 98 F (36.7 C), temperature source Tympanic, resp. rate 20, height _0  (1.88 m), weight 154 lb 11.2 oz (70.2 kg), SpO2 100 %.  LABORATORY DATA: Lab Results  Component Value Date   WBC 5.7 11/06/2020   HGB 13.3 11/06/2020   HCT 37.4  (L) 11/06/2020   MCV 94.0 11/06/2020   PLT 267 11/06/2020      Chemistry      Component Value Date/Time   NA 133 (L) 11/06/2020 0911   NA 129 (L) 11/24/2018 1520   K 4.2 11/06/2020 0911   CL 98 11/06/2020 0911   CO2 21 (L) 11/06/2020 0911   BUN <4 (L) 11/06/2020 0911   BUN 8 11/24/2018 1520   CREATININE 0.69 11/06/2020 0911      Component Value Date/Time   CALCIUM 9.2 11/06/2020 0911   ALKPHOS 105 11/06/2020 0911   AST 26 11/06/2020 0911   ALT 7 11/06/2020 0911   BILITOT 0.4 11/06/2020 0911       RADIOGRAPHIC STUDIES: CT Chest W Contrast  Result Date: 11/06/2020 CLINICAL DATA:  Metastatic lung cancer restaging EXAM: CT CHEST, ABDOMEN, AND PELVIS WITH CONTRAST TECHNIQUE: Multidetector CT imaging of the chest, abdomen and pelvis was performed following the standard protocol during bolus administration of intravenous contrast. CONTRAST:  130m OMNIPAQUE IOHEXOL 300 MG/ML SOLN, additional oral enteric contrast COMPARISON:  CT chest, 08/08/2020, PET-CT, 11/12/2019 FINDINGS: CT CHEST FINDINGS Cardiovascular: Aortic atherosclerosis. Normal heart size. Scattered left coronary artery calcifications. No pericardial effusion. Mediastinum/Nodes: No enlarged mediastinal, hilar, or axillary lymph nodes. Thyroid gland, trachea, and esophagus demonstrate no significant findings. Lungs/Pleura: No significant interval change in post treatment appearance of the right chest, with dense, bandlike consolidation, fibrosis, and volume loss of the perihilar right lung, particularly the paramedian right upper lobe (series 6,  image 54, 71). Background of very fine centrilobular nodularity, most concentrated in the lung apices. No pleural effusion or pneumothorax. Musculoskeletal: No chest wall mass or suspicious bone lesions identified. CT ABDOMEN PELVIS FINDINGS Hepatobiliary: Marked interval increase in size of a rim enhancing, hypodense mass of the anterior midline liver, hepatic segment IVA/B, measuring 6.5 x  5.1 cm, previously 3.7 x 3.3 cm when measured similarly (series 2, image 63). There is associated segmental biliary ductal dilatation (series 2, image 60). There is an additional rim enhancing, internally hypodense lesion of the inferior tip of the right lobe of the liver, hepatic segment VII, measuring 2.1 x 2.0 cm (series 2, image 82). This was not previously included in the field of view on prior imaging of the chest. No gallstones, gallbladder wall thickening, or biliary dilatation. Pancreas: Unremarkable. No pancreatic ductal dilatation or surrounding inflammatory changes. Spleen: Normal in size without significant abnormality. Adrenals/Urinary Tract: Adrenal glands are unremarkable. Kidneys are normal, without renal calculi, solid lesion, or hydronephrosis. Mild thickening of the urinary bladder wall, likely due to chronic outlet obstruction. Stomach/Bowel: Stomach is within normal limits. Appendix appears normal. No evidence of bowel wall thickening, distention, or inflammatory changes. Severe descending and sigmoid diverticulosis. Vascular/Lymphatic: Aortic atherosclerosis. No enlarged abdominal or pelvic lymph nodes. Reproductive: Prostatomegaly Other: No abdominal wall hernia or abnormality. No abdominopelvic ascites. Musculoskeletal: No acute or significant osseous findings. IMPRESSION: 1. Marked interval increase in size of a rim enhancing, hypodense mass of the anterior midline liver, hepatic segment IVA/B, measuring 6.5 x 5.1 cm, previously 3.7 x 3.3 cm when measured similarly. There is associated segmental biliary ductal dilatation. 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 2. There is an additional rim enhancing, internally hypodense lesion of the inferior tip of the right lobe of the liver, hepatic segment VII, measuring 2.1 x 2.0 cm. This is consistent with an additional metastasis but was not previously included in the field of view on prior imaging of the chest. 3. No  significant interval change in post treatment appearance of the right chest, with dense, bandlike consolidation, fibrosis, and volume loss. 4. Background of very fine centrilobular nodularity, most concentrated in the lung apices, consistent with smoking-related respiratory bronchiolitis. 5. Coronary artery disease. Aortic Atherosclerosis (ICD10-I70.0). Electronically Signed   By: Eddie Candle M.D.   On: 11/06/2020 15:04   CT Abdomen Pelvis W Contrast  Result Date: 11/06/2020 CLINICAL DATA:  Metastatic lung cancer restaging EXAM: CT CHEST, ABDOMEN, AND PELVIS WITH CONTRAST TECHNIQUE: Multidetector CT imaging of the chest, abdomen and pelvis was performed following the standard protocol during bolus administration of intravenous contrast. CONTRAST:  170m OMNIPAQUE IOHEXOL 300 MG/ML SOLN, additional oral enteric contrast COMPARISON:  CT chest, 08/08/2020, PET-CT, 11/12/2019 FINDINGS: CT CHEST FINDINGS Cardiovascular: Aortic atherosclerosis. Normal heart size. Scattered left coronary artery calcifications. No pericardial effusion. Mediastinum/Nodes: No enlarged mediastinal, hilar, or axillary lymph nodes. Thyroid gland, trachea, and esophagus demonstrate no significant findings. Lungs/Pleura: No significant interval change in post treatment appearance of the right chest, with dense, bandlike consolidation, fibrosis, and volume loss of the perihilar right lung, particularly the paramedian right upper lobe (series 6, image 54, 71). Background of very fine centrilobular nodularity, most concentrated in the lung apices. No pleural effusion or pneumothorax. Musculoskeletal: No chest wall mass or suspicious bone lesions identified. CT ABDOMEN PELVIS FINDINGS Hepatobiliary: Marked interval increase in size of a rim enhancing, hypodense mass of the anterior midline liver, hepatic segment IVA/B, measuring 6.5 x 5.1 cm, previously 3.7 x 3.3  cm when measured similarly (series 2, image 63). There is associated segmental  biliary ductal dilatation (series 2, image 60). There is an additional rim enhancing, internally hypodense lesion of the inferior tip of the right lobe of the liver, hepatic segment VII, measuring 2.1 x 2.0 cm (series 2, image 82). This was not previously included in the field of view on prior imaging of the chest. No gallstones, gallbladder wall thickening, or biliary dilatation. Pancreas: Unremarkable. No pancreatic ductal dilatation or surrounding inflammatory changes. Spleen: Normal in size without significant abnormality. Adrenals/Urinary Tract: Adrenal glands are unremarkable. Kidneys are normal, without renal calculi, solid lesion, or hydronephrosis. Mild thickening of the urinary bladder wall, likely due to chronic outlet obstruction. Stomach/Bowel: Stomach is within normal limits. Appendix appears normal. No evidence of bowel wall thickening, distention, or inflammatory changes. Severe descending and sigmoid diverticulosis. Vascular/Lymphatic: Aortic atherosclerosis. No enlarged abdominal or pelvic lymph nodes. Reproductive: Prostatomegaly Other: No abdominal wall hernia or abnormality. No abdominopelvic ascites. Musculoskeletal: No acute or significant osseous findings. IMPRESSION: 1. Marked interval increase in size of a rim enhancing, hypodense mass of the anterior midline liver, hepatic segment IVA/B, measuring 6.5 x 5.1 cm, previously 3.7 x 3.3 cm when measured similarly. There is associated segmental biliary ductal dilatation. 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 2. There is an additional rim enhancing, internally hypodense lesion of the inferior tip of the right lobe of the liver, hepatic segment VII, measuring 2.1 x 2.0 cm. This is consistent with an additional metastasis but was not previously included in the field of view on prior imaging of the chest. 3. No significant interval change in post treatment appearance of the right chest, with dense, bandlike consolidation,  fibrosis, and volume loss. 4. Background of very fine centrilobular nodularity, most concentrated in the lung apices, consistent with smoking-related respiratory bronchiolitis. 5. Coronary artery disease. Aortic Atherosclerosis (ICD10-I70.0). Electronically Signed   By: Eddie Candle M.D.   On: 11/06/2020 15:04     ASSESSMENT AND PLAN: This is a very pleasant 70 years old white male with suspicious metastatic non-small cell lung cancer initially diagnosed as stage IIIA/B (T3, N1/N2, M0) non-small cell lung cancer, adenocarcinoma presented with right upper lobe lung mass with direct extension across the major fissure into the superior segment of the right lower lobe and ipsilateral hilar lymphadenopathy as well as questionable subcarinal lymphadenopathy. The patient completed a course of concurrent chemoradiation with weekly carboplatin and paclitaxel status post 7 cycles.  He tolerated this treatment well with no concerning adverse effect except for mild sore throat and mouth. The patient had partial response to this treatment. The patient is currently undergoing a course of consolidation treatment with immunotherapy with Imfinzi 1500 mg IV every 4 weeks status post 6 cycles.  He has been tolerating his treatment well with no concerning adverse effects. His last CT scan of the chest several months ago showed no concerning findings for disease progression in the chest but the patient has a highly suspicious liver lesion concerning for metastatic disease.  The patient underwent ultrasound-guided core biopsy of this lesion. The final pathology read at [facility] addition to [facility] showed no evidence of malignancy and there was suspicious inflammatory process of biliary origin. The patient continued on observation. He had repeat CT scan of the chest performed recently.  I personally and independently reviewed the scan images and discussed the result and showed the images to the patient  and his wife. Has a scan  showed no concerning findings for disease progression in the chest but he had marked interval increase in the size of the rim-enhancing hypodense mass of the anterior midline liver hepatic segment IVA/B measuring 6.5 x 5.1 cm.  There was also additional rim-enhancing internally hypodense lesion of the inferior table of the right lobe of the liver consistent with additional metastasis. I had a lengthy discussion with the patient and his wife about his condition and further investigation of these lesions.  I recommended for the patient to have repeat ultrasound-guided core biopsy of the liver lesion to be done at [facility] or [facility] and reviewed by the [facility] pathology. I will see the patient back for follow-up visit in 2 weeks for evaluation and discussion of his biopsy results and treatment options. If the biopsy is consistent with metastatic disease, I will consider him for treatment with Lumakras (Sotorasib). The patient was advised to call immediately if he has any concerning symptoms in the interval.  The patient voices understanding of current disease status and treatment options and is in agreement with the current care plan.  All questions were answered. The patient knows to call the clinic with any problems, questions or concerns. We can certainly see the patient much sooner if necessary.  Disclaimer: This note was dictated with voice recognition software. Similar sounding words can inadvertently be transcribed and may not be corrected upon review.

## 2020-11-14 ENCOUNTER — Encounter (HOSPITAL_COMMUNITY): Payer: Self-pay

## 2020-11-14 ENCOUNTER — Telehealth (HOSPITAL_COMMUNITY): Payer: Self-pay

## 2020-11-14 NOTE — Progress Notes (Signed)
       Patient Demographics  Patient Name  Christopher Harding, Christopher Harding Legal Sex  Male DOB  1950-12-27 SSN  [id number] Address  [address] [place] [address] Phone  [phone] [facility])  [phone] (Mobile)     RE: Biopsy Received: Today Curt Bears, MD  Kirstie Peri, RN Yes okay to hold.  Thank you.         Previous Messages    ----- Message -----  From: Lenore Cordia  Sent: 11/14/2020  10:04 AM EDT  To: Curt Bears, MD, Ardeen Garland, RN  Subject: FW: Biopsy                                     Hello   Pt. [name] is on the Blood thinner Xarelto  He will need to hold it one day prior to Biopsy  Permission is needed.   [name]  ----- Message -----  From: Suzette Battiest, MD  Sent: 11/14/2020   8:21 AM EDT  To: Lenore Cordia  Subject: RE: Biopsy                                     Approved for ultrasound guided liver mass biopsy with contrast enhanced ultrasound.     [name]  ----- Message -----  From: Lenore Cordia  Sent: 11/13/2020   4:12 PM EDT  To: Ir Procedure Requests  Subject: Biopsy                                         Procedure Requested: Ultrasound-guided core biopsy    Reason for Procedure: suspicious liver mass    Provider Requesting:  Dr Julien Nordmann  Provider Telephone:  [phone]    Other Info:

## 2020-11-18 ENCOUNTER — Telehealth: Payer: Self-pay | Admitting: Internal Medicine

## 2020-11-18 NOTE — Telephone Encounter (Signed)
Scheduled per los. Called and spoke with patients wife. Confirmed appt

## 2020-11-20 ENCOUNTER — Other Ambulatory Visit: Payer: Self-pay | Admitting: Radiology

## 2020-11-21 ENCOUNTER — Ambulatory Visit (HOSPITAL_COMMUNITY): Payer: Medicare Other

## 2020-11-21 ENCOUNTER — Other Ambulatory Visit: Payer: Self-pay | Admitting: Student

## 2020-11-24 ENCOUNTER — Encounter (HOSPITAL_COMMUNITY): Payer: Self-pay

## 2020-11-24 ENCOUNTER — Other Ambulatory Visit: Payer: Self-pay

## 2020-11-24 ENCOUNTER — Ambulatory Visit (HOSPITAL_COMMUNITY)
Admission: RE | Admit: 2020-11-24 | Discharge: 2020-11-24 | Disposition: A | Discharge status: Home / Self Care | Payer: Medicare Other | Source: Ambulatory Visit | Attending: Internal Medicine | Admitting: Internal Medicine

## 2020-11-24 DIAGNOSIS — C787 Secondary malignant neoplasm of liver and intrahepatic bile duct: Secondary | ICD-10-CM | POA: Insufficient documentation

## 2020-11-24 DIAGNOSIS — K7689 Other specified diseases of liver: Secondary | ICD-10-CM | POA: Diagnosis not present

## 2020-11-24 DIAGNOSIS — C229 Malignant neoplasm of liver, not specified as primary or secondary: Secondary | ICD-10-CM | POA: Diagnosis not present

## 2020-11-24 DIAGNOSIS — Z85118 Personal history of other malignant neoplasm of bronchus and lung: Secondary | ICD-10-CM | POA: Insufficient documentation

## 2020-11-24 DIAGNOSIS — R160 Hepatomegaly, not elsewhere classified: Secondary | ICD-10-CM | POA: Diagnosis not present

## 2020-11-24 DIAGNOSIS — C3490 Malignant neoplasm of unspecified part of unspecified bronchus or lung: Secondary | ICD-10-CM | POA: Diagnosis not present

## 2020-11-24 DIAGNOSIS — C3491 Malignant neoplasm of unspecified part of right bronchus or lung: Secondary | ICD-10-CM

## 2020-11-24 LAB — CBC
HCT: 38.3 % — ABNORMAL LOW (ref 39.0–52.0)
Hemoglobin: 13.2 g/dL (ref 13.0–17.0)
MCH: 33.4 pg (ref 26.0–34.0)
MCHC: 34.5 g/dL (ref 30.0–36.0)
MCV: 97 fL (ref 80.0–100.0)
Platelets: 269 10*3/uL (ref 150–400)
RBC: 3.95 MIL/uL — ABNORMAL LOW (ref 4.22–5.81)
RDW: 13.2 % (ref 11.5–15.5)
WBC: 4.7 10*3/uL (ref 4.0–10.5)
nRBC: 0 % (ref 0.0–0.2)

## 2020-11-24 LAB — PROTIME-INR
INR: 1.1 (ref 0.8–1.2)
Prothrombin Time: 14.3 seconds (ref 11.4–15.2)

## 2020-11-24 IMAGING — US US BIOPSY CORE LIVER
1 series · 10 of 10 positions shown · non-contrast
Comparison: none

INDICATION: 70-year-old male with a history liver mass concerning for carcinoma.
Prior biopsy [DATE] was negative for carcinoma.

[Series 1: us biopsy (liver) · 10 of 10 slices shown]
[im 1/10]
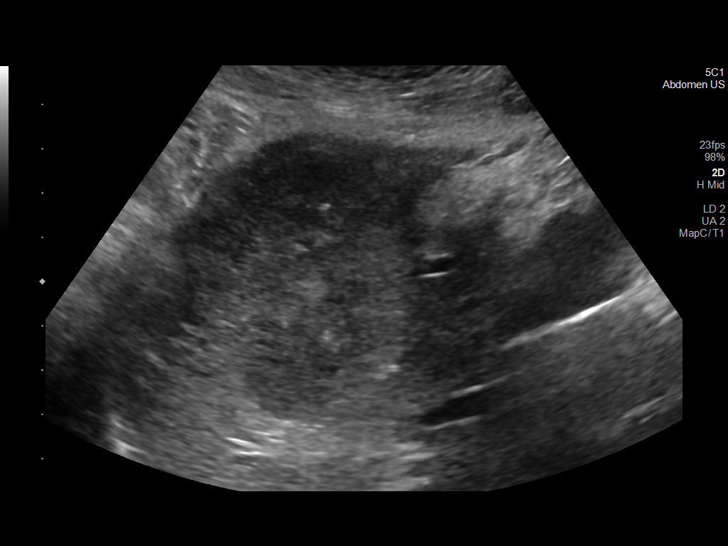
[im 2/10]
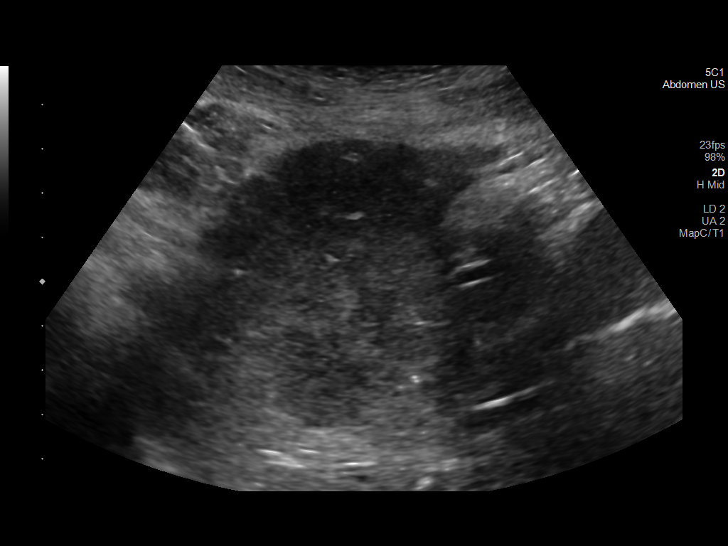
[im 3/10]
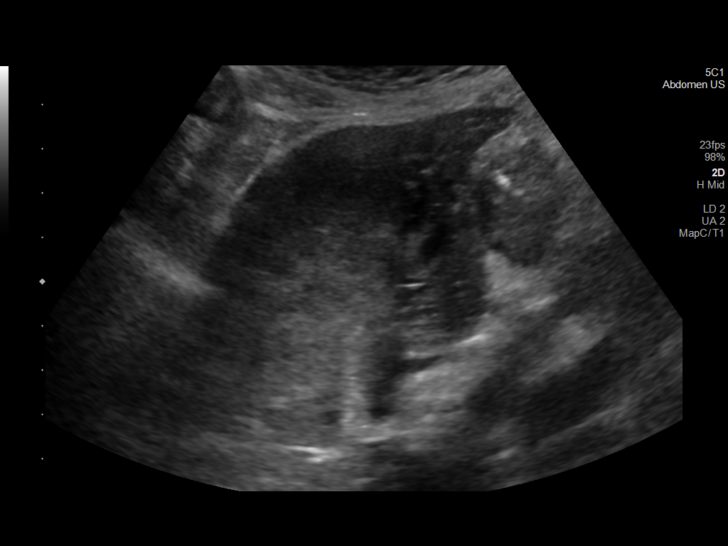
[im 4/10]
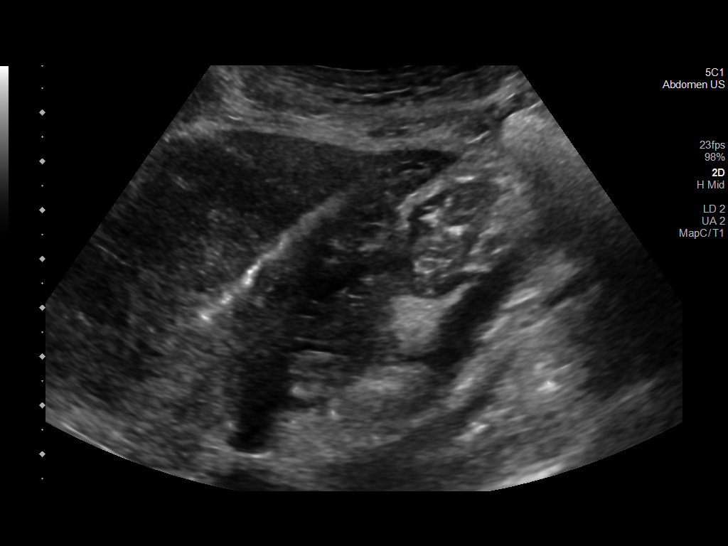
[im 5/10]
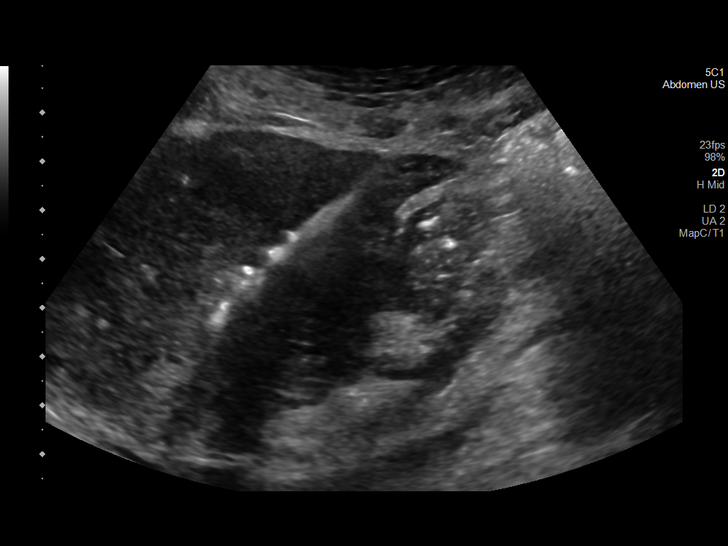
[im 6/10]
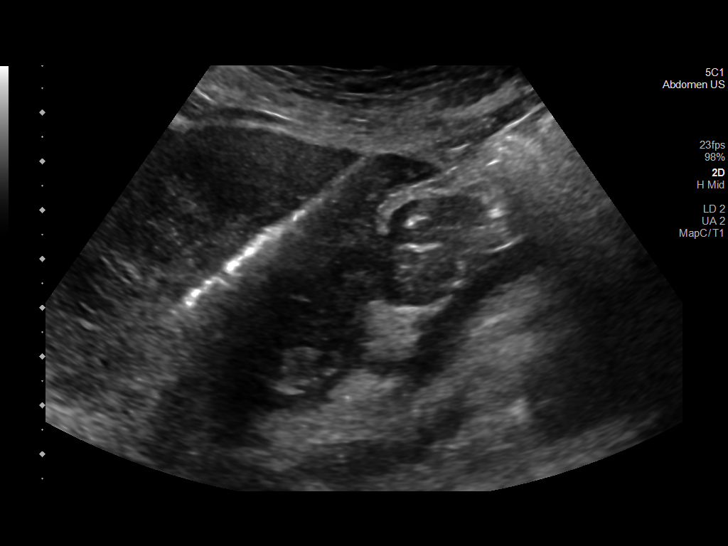
[im 7/10]
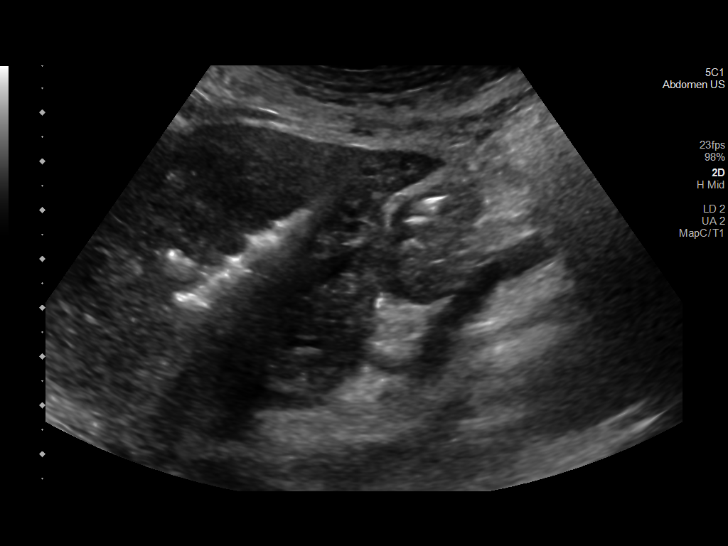
[im 8/10]
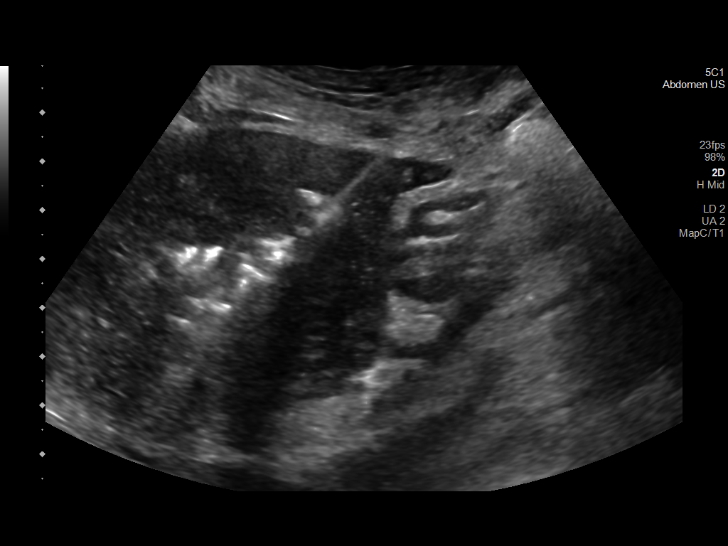
[im 9/10]
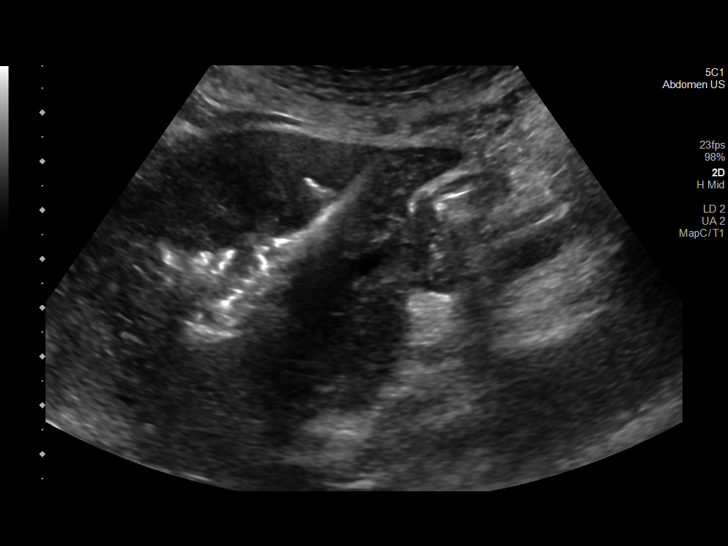
[im 10/10]
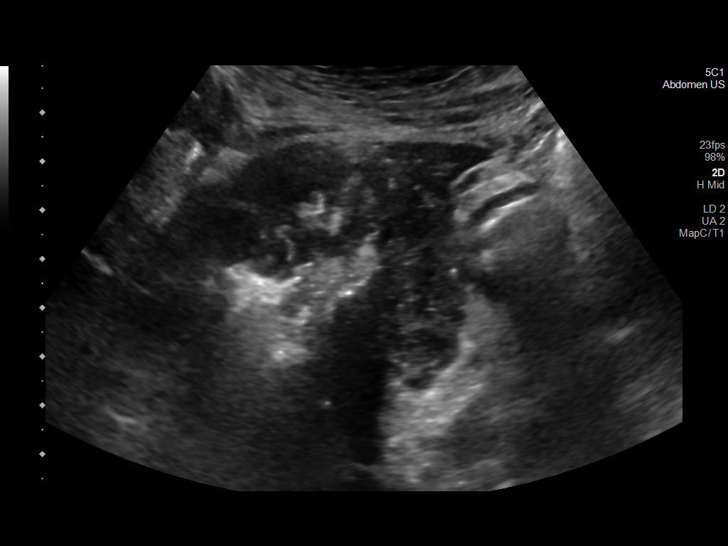

[10 of 10 positions shown; findings below may reference images not displayed]

Additional history of non-small cell lung cancer

EXAM:
ULTRASOUND-GUIDED LIVER MASS BIOPSY

MEDICATIONS:
None.

ANESTHESIA/SEDATION:
Moderate (conscious) sedation was employed during this procedure. A
total of Versed 1.0 mg and Fentanyl 50 mcg was administered
intravenously.

Moderate Sedation Time: 15 minutes. The patient's level of
consciousness and vital signs were monitored continuously by
radiology nursing throughout the procedure under my direct
supervision.

FLUOROSCOPY TIME:  Ultrasound

COMPLICATIONS:
None

PROCEDURE:
Informed written consent was obtained from the patient after a
thorough discussion of the procedural risks, benefits and
alternatives. All questions were addressed. Maximal Sterile Barrier
Technique was utilized including caps, mask, sterile gowns, sterile
gloves, sterile drape, hand hygiene and skin antiseptic. A timeout
was performed prior to the initiation of the procedure.

Ultrasound survey of the right liver lobe performed with images
stored and sent to PACs. Tumor of the left liver was identified. The
ultrasound characteristics are mixed hypoechoic and hyperechoic,
fairly vague on ultrasound imaging when compared to the prior CT
imaging.

The right lower thorax/right upper abdomen was prepped with
chlorhexidine in a sterile fashion, and a sterile drape was applied
covering the operative field. A sterile gown and sterile gloves were
used for the procedure. Local anesthesia was provided with 1%
Lidocaine.

The patient was prepped and draped sterilely and the skin and
subcutaneous tissues were generously infiltrated with 1% lidocaine.

A 17 gauge introducer needle was then advanced under ultrasound
guidance in subxiphoid location into the left liver lobe. The stylet
was removed, and multiple separate 18 gauge core biopsy were
retrieved. Samples were placed into formalin for transportation to
the lab.

Gel-Foam pledgets were then infused with a small amount of saline
for assistance with hemostasis.

The needle was removed, and a final ultrasound image was performed.

The patient tolerated the procedure well and remained
hemodynamically stable throughout.

No complications were encountered and no significant blood loss was
encounter.
IMPRESSION: Status post ultrasound-guided biopsy of left liver mass.

## 2020-11-24 MED ORDER — GELATIN ABSORBABLE 12-7 MM EX MISC
CUTANEOUS | Status: AC
  Filled 2020-11-24: qty 1

## 2020-11-24 MED ORDER — FENTANYL CITRATE (PF) 100 MCG/2ML IJ SOLN
INTRAMUSCULAR | Status: AC | PRN
  Administered 2020-11-24: 50 ug via INTRAVENOUS

## 2020-11-24 MED ORDER — SODIUM CHLORIDE 0.9 % IV SOLN: INTRAVENOUS | Status: DC

## 2020-11-24 MED ORDER — MIDAZOLAM HCL 2 MG/2ML IJ SOLN
INTRAMUSCULAR | Status: AC | PRN
  Administered 2020-11-24: 1 mg via INTRAVENOUS

## 2020-11-24 MED ORDER — LIDOCAINE HCL (PF) 1 % IJ SOLN
INTRAMUSCULAR | Status: AC
  Filled 2020-11-24: qty 30

## 2020-11-24 MED ORDER — FENTANYL CITRATE (PF) 100 MCG/2ML IJ SOLN
INTRAMUSCULAR | Status: AC
  Filled 2020-11-24: qty 2

## 2020-11-24 MED ORDER — MIDAZOLAM HCL 2 MG/2ML IJ SOLN
INTRAMUSCULAR | Status: AC
  Filled 2020-11-24: qty 2

## 2020-11-24 NOTE — H&P (Signed)
Chief Complaint: Patient was seen in consultation today for liver lesion biopsy at the request of [facility]  Referring Physician(s): [name],[name]  Supervising Physician: Corrie Mckusick  Patient Status: [facility] - Out-pt  History of Present Illness: Christopher Harding is a 70 y.o. male   Known [place] Lung Cancer dx 10/2019 Follows with Dr Julien Nordmann Discovered liver lesion 08/08/20--- biopsy done at [facility] 08/20/20: DIAGNOSIS:  A. LIVER, LEFT LOBE; ULTRASOUND-GUIDED CORE NEEDLE BIOPSY:  - NEGATIVE FOR MALIGNANCY.   Follow up scan 11/06/20: IMPRESSION: 1. Marked interval increase in size of a rim enhancing, hypodense mass of the anterior midline liver, hepatic segment IVA/B, measuring 6.5 x 5.1 cm, previously 3.7 x 3.3 cm when measured similarly. There is associated segmental biliary ductal dilatation. 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 2. There is an additional rim enhancing, internally hypodense lesion of the inferior tip of the right lobe of the liver, hepatic segment VII, measuring 2.1 x 2.0 cm. This is consistent with an additional metastasis but was not previously included in the field of view on prior imaging of the chest.  Pt experiencing wt loss- 20 lbs in recent months  Dr Julien Nordmann requesting repeat biopsy Pt is scheduled today for same.  LD Xarelto Sat 6/18 Scheduled today for re BX   Past Medical History:  Diagnosis Date   A-fib [facility])    s/p DCCV 12/07/18   Cancer ([place])    Depression    situational   Dysrhythmia    Hypertension     Past Surgical History:  Procedure Laterality Date   CARDIOVERSION N/A 12/07/2018   Procedure: CARDIOVERSION;  Surgeon: Josue Hector, MD;  Location: [place];  Service: Cardiovascular;  Laterality: N/A;   FINE NEEDLE ASPIRATION  10/30/2019   Procedure: FINE NEEDLE ASPIRATION (FNA) LINEAR;  Surgeon: Candee Furbish, MD;  Location: [facility] ENDOSCOPY;  Service: Pulmonary;;   INGUINAL HERNIA REPAIR Right  01/25/2019   Procedure: RIGHT INGUINAL HERNIA REPAIR WITH MESH;  Surgeon: Coralie Keens, MD;  Location: [place];  Service: General;  Laterality: Right;  TAP BLOCK   INSERTION OF MESH Right 01/25/2019   Procedure: Insertion Of Mesh;  Surgeon: Coralie Keens, MD;  Location: [place];  Service: General;  Laterality: Right;   VIDEO BRONCHOSCOPY WITH ENDOBRONCHIAL ULTRASOUND N/A 10/30/2019   Procedure: VIDEO BRONCHOSCOPY WITH ENDOBRONCHIAL ULTRASOUND;  Surgeon: Candee Furbish, MD;  Location: [facility] ENDOSCOPY;  Service: Pulmonary;  Laterality: N/A;    Allergies: Patient has no known allergies.  Medications: Prior to Admission medications   Medication Sig Start Date End Date Taking? Authorizing Provider  metoprolol tartrate (LOPRESSOR) 50 MG tablet Take 50 mg by mouth 2 (two) times daily with a meal. 09/14/18  Yes [provider]  naproxen sodium (ALEVE) 220 MG tablet Take 220 mg by mouth daily as needed (pain).   Yes [provider]  XARELTO 20 MG TABS tablet Take 20 mg by mouth daily. 09/08/18  Yes [provider]  pantoprazole (PROTONIX) 40 MG tablet TAKE 1 TABLET BY MOUTH EVERY DAY Patient not taking: Reported on 11/18/2020 10/22/20   Curt Bears, MD  predniSONE (DELTASONE) 20 MG tablet 4 tablet p.o. daily for 1 week followed by 3 tablet p.o. daily for 1 week followed by 2 tablet p.o. daily for 1 week followed by 1 tablet p.o. daily for 1 week followed by 0.5 tablet p.o. daily for 1 week Patient not taking: Reported on 11/18/2020 09/12/20   Curt Bears, MD     Family  History  Problem Relation Age of Onset   Dementia Mother    Alzheimer's disease Mother    Hypertension Mother    Stroke Father    Clotting disorder Father    Esophageal cancer Neg Hx    Stomach cancer Neg Hx    Rectal cancer Neg Hx    Colon polyps Neg Hx     Social History   Socioeconomic History   Marital status: Married    Spouse name: Not on file   Number of children: 2   Years of  education: Not on file   Highest education level: Not on file  Occupational History   Occupation: retired  Tobacco Use   Smoking status: Former    Packs/day: 0.50    Years: 45.00    Pack years: 22.50    Types: Cigarettes   Smokeless tobacco: Never  Vaping Use   Vaping Use: Never used  Substance and Sexual Activity   Alcohol use: Yes    Alcohol/week: 50.0 standard drinks    Types: 50 Cans of beer per week   Drug use: No   Sexual activity: Not on file  Other Topics Concern   Not on file  Social History Narrative   Not on file   Social Determinants of Health   Financial Resource Strain: Not on file  Food Insecurity: Not on file  Transportation Needs: Not on file  Physical Activity: Not on file  Stress: Not on file  Social Connections: Not on file    Review of Systems: A 12 point ROS discussed and pertinent positives are indicated in the HPI above.  All other systems are negative.  Review of Systems  Constitutional:  Positive for activity change, appetite change, fatigue and unexpected weight change.  Respiratory:  Negative for cough and shortness of breath.   Cardiovascular:  Negative for chest pain.  Gastrointestinal:  Negative for abdominal pain.  Psychiatric/Behavioral:  Negative for behavioral problems and confusion.    Vital Signs: BP 114/74   Pulse 60   Temp 98.2 F (36.8 C) (Oral)   Resp 16   Ht 6\' 2"  (1.88 m)   Wt 160 lb (72.6 kg)   SpO2 100%   BMI 20.54 kg/m   Physical Exam Vitals reviewed.  HENT:     Mouth/Throat:     Mouth: Mucous membranes are moist.  Cardiovascular:     Rate and Rhythm: Normal rate and regular rhythm.     Heart sounds: Normal heart sounds.  Pulmonary:     Effort: Pulmonary effort is normal.     Breath sounds: Normal breath sounds.  Abdominal:     Palpations: Abdomen is soft.     Tenderness: There is no abdominal tenderness.  Musculoskeletal:     Right lower leg: No edema.     Left lower leg: No edema.  Skin:     General: Skin is warm.  Neurological:     Mental Status: He is alert and oriented to person, place, and time.  Psychiatric:        Behavior: Behavior normal.    Imaging: CT Chest W Contrast  Result Date: 11/06/2020 CLINICAL DATA:  Metastatic lung cancer restaging EXAM: CT CHEST, ABDOMEN, AND PELVIS WITH CONTRAST TECHNIQUE: Multidetector CT imaging of the chest, abdomen and pelvis was performed following the standard protocol during bolus administration of intravenous contrast. CONTRAST:  132mL OMNIPAQUE IOHEXOL 300 MG/ML SOLN, additional oral enteric contrast COMPARISON:  CT chest, 08/08/2020, PET-CT, 11/12/2019 FINDINGS: CT CHEST FINDINGS Cardiovascular: Aortic  atherosclerosis. Normal heart size. Scattered left coronary artery calcifications. No pericardial effusion. Mediastinum/Nodes: No enlarged mediastinal, hilar, or axillary lymph nodes. Thyroid gland, trachea, and esophagus demonstrate no significant findings. Lungs/Pleura: No significant interval change in post treatment appearance of the right chest, with dense, bandlike consolidation, fibrosis, and volume loss of the perihilar right lung, particularly the paramedian right upper lobe (series 6, image 54, 71). Background of very fine centrilobular nodularity, most concentrated in the lung apices. No pleural effusion or pneumothorax. Musculoskeletal: No chest wall mass or suspicious bone lesions identified. CT ABDOMEN PELVIS FINDINGS Hepatobiliary: Marked interval increase in size of a rim enhancing, hypodense mass of the anterior midline liver, hepatic segment IVA/B, measuring 6.5 x 5.1 cm, previously 3.7 x 3.3 cm when measured similarly (series 2, image 63). There is associated segmental biliary ductal dilatation (series 2, image 60). There is an additional rim enhancing, internally hypodense lesion of the inferior tip of the right lobe of the liver, hepatic segment VII, measuring 2.1 x 2.0 cm (series 2, image 82). This was not previously included  in the field of view on prior imaging of the chest. No gallstones, gallbladder wall thickening, or biliary dilatation. Pancreas: Unremarkable. No pancreatic ductal dilatation or surrounding inflammatory changes. Spleen: Normal in size without significant abnormality. Adrenals/Urinary Tract: Adrenal glands are unremarkable. Kidneys are normal, without renal calculi, solid lesion, or hydronephrosis. Mild thickening of the urinary bladder wall, likely due to chronic outlet obstruction. Stomach/Bowel: Stomach is within normal limits. Appendix appears normal. No evidence of bowel wall thickening, distention, or inflammatory changes. Severe descending and sigmoid diverticulosis. Vascular/Lymphatic: Aortic atherosclerosis. No enlarged abdominal or pelvic lymph nodes. Reproductive: Prostatomegaly Other: No abdominal wall hernia or abnormality. No abdominopelvic ascites. Musculoskeletal: No acute or significant osseous findings. IMPRESSION: 1. Marked interval increase in size of a rim enhancing, hypodense mass of the anterior midline liver, hepatic segment IVA/B, measuring 6.5 x 5.1 cm, previously 3.7 x 3.3 cm when measured similarly. There is associated segmental biliary ductal dilatation. 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 2. There is an additional rim enhancing, internally hypodense lesion of the inferior tip of the right lobe of the liver, hepatic segment VII, measuring 2.1 x 2.0 cm. This is consistent with an additional metastasis but was not previously included in the field of view on prior imaging of the chest. 3. No significant interval change in post treatment appearance of the right chest, with dense, bandlike consolidation, fibrosis, and volume loss. 4. Background of very fine centrilobular nodularity, most concentrated in the lung apices, consistent with smoking-related respiratory bronchiolitis. 5. Coronary artery disease. Aortic Atherosclerosis (ICD10-I70.0). Electronically Signed    By: Eddie Candle M.D.   On: 11/06/2020 15:04   CT Abdomen Pelvis W Contrast  Result Date: 11/06/2020 CLINICAL DATA:  Metastatic lung cancer restaging EXAM: CT CHEST, ABDOMEN, AND PELVIS WITH CONTRAST TECHNIQUE: Multidetector CT imaging of the chest, abdomen and pelvis was performed following the standard protocol during bolus administration of intravenous contrast. CONTRAST:  120mL OMNIPAQUE IOHEXOL 300 MG/ML SOLN, additional oral enteric contrast COMPARISON:  CT chest, 08/08/2020, PET-CT, 11/12/2019 FINDINGS: CT CHEST FINDINGS Cardiovascular: Aortic atherosclerosis. Normal heart size. Scattered left coronary artery calcifications. No pericardial effusion. Mediastinum/Nodes: No enlarged mediastinal, hilar, or axillary lymph nodes. Thyroid gland, trachea, and esophagus demonstrate no significant findings. Lungs/Pleura: No significant interval change in post treatment appearance of the right chest, with dense, bandlike consolidation, fibrosis, and volume loss of the perihilar right lung, particularly the paramedian right upper lobe (series  6, image 54, 71). Background of very fine centrilobular nodularity, most concentrated in the lung apices. No pleural effusion or pneumothorax. Musculoskeletal: No chest wall mass or suspicious bone lesions identified. CT ABDOMEN PELVIS FINDINGS Hepatobiliary: Marked interval increase in size of a rim enhancing, hypodense mass of the anterior midline liver, hepatic segment IVA/B, measuring 6.5 x 5.1 cm, previously 3.7 x 3.3 cm when measured similarly (series 2, image 63). There is associated segmental biliary ductal dilatation (series 2, image 60). There is an additional rim enhancing, internally hypodense lesion of the inferior tip of the right lobe of the liver, hepatic segment VII, measuring 2.1 x 2.0 cm (series 2, image 82). This was not previously included in the field of view on prior imaging of the chest. No gallstones, gallbladder wall thickening, or biliary dilatation.  Pancreas: Unremarkable. No pancreatic ductal dilatation or surrounding inflammatory changes. Spleen: Normal in size without significant abnormality. Adrenals/Urinary Tract: Adrenal glands are unremarkable. Kidneys are normal, without renal calculi, solid lesion, or hydronephrosis. Mild thickening of the urinary bladder wall, likely due to chronic outlet obstruction. Stomach/Bowel: Stomach is within normal limits. Appendix appears normal. No evidence of bowel wall thickening, distention, or inflammatory changes. Severe descending and sigmoid diverticulosis. Vascular/Lymphatic: Aortic atherosclerosis. No enlarged abdominal or pelvic lymph nodes. Reproductive: Prostatomegaly Other: No abdominal wall hernia or abnormality. No abdominopelvic ascites. Musculoskeletal: No acute or significant osseous findings. IMPRESSION: 1. Marked interval increase in size of a rim enhancing, hypodense mass of the anterior midline liver, hepatic segment IVA/B, measuring 6.5 x 5.1 cm, previously 3.7 x 3.3 cm when measured similarly. There is associated segmental biliary ductal dilatation. 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 2. There is an additional rim enhancing, internally hypodense lesion of the inferior tip of the right lobe of the liver, hepatic segment VII, measuring 2.1 x 2.0 cm. This is consistent with an additional metastasis but was not previously included in the field of view on prior imaging of the chest. 3. No significant interval change in post treatment appearance of the right chest, with dense, bandlike consolidation, fibrosis, and volume loss. 4. Background of very fine centrilobular nodularity, most concentrated in the lung apices, consistent with smoking-related respiratory bronchiolitis. 5. Coronary artery disease. Aortic Atherosclerosis (ICD10-I70.0). Electronically Signed   By: Eddie Candle M.D.   On: 11/06/2020 15:04    Labs:  CBC: Recent Labs    08/12/20 0942 08/18/20 1130  09/12/20 0916 11/06/20 0911  WBC 4.1 6.3 4.7 5.7  HGB 14.2 14.2 14.7 13.3  HCT 39.5 40.3 41.1 37.4*  PLT 199 209 208 267    COAGS: Recent Labs    08/18/20 1130  INR 1.1    BMP: Recent Labs    01/07/20 1106 01/16/20 1348 02/05/20 1139 02/19/20 1403 03/24/20 1116 08/12/20 0942 08/18/20 1130 09/12/20 0916 11/06/20 0911  NA 129* 125* 130* 128*   < > 129* 127* 132* 133*  K 4.3 4.5 4.6 4.6   < > 4.5 4.2 4.5 4.2  CL 99 96* 100 101   < > 99 97* 100 98  CO2 22 19* 23 21*   < > 20* 22 18* 21*  GLUCOSE 92 101* 86 86   < > 82 84 112* 84  BUN 8 11 8  7*   < > 6* 6* 6* <4*  CALCIUM 9.6 9.3 9.5 8.8*   < > 9.0 8.9 9.0 9.2  CREATININE 0.72 0.77 0.74 0.71   < > 0.70 0.68 0.76 0.69  GFRNONAA >60 >  60 >60 >60   < > >60 >60 >60 >60  GFRAA >60 >60 >60 >60  --   --   --   --   --    < > = values in this interval not displayed.    LIVER FUNCTION TESTS: Recent Labs    08/12/20 0942 08/18/20 1130 09/12/20 0916 11/06/20 0911  BILITOT 0.4 0.7 0.6 0.4  AST 21 21 25 26   ALT 8 11 8 7   ALKPHOS 47 43 64 105  PROT 7.3 7.2 7.6 7.2  ALBUMIN 3.8 3.8 3.8 3.3*    TUMOR MARKERS: No results for input(s): AFPTM, CEA, CA199, CHROMGRNA in the last 8760 hours.  Assessment and Plan:  Hx [facility] Lung Cancer- 10/2019 Liver lesion was biopsied 08/20/20--- showing no malignancy Follow up scan with enlargement of lesion Now for re biopsy per Dr Julien Nordmann LD Xarelto 6/18 Here today for biopsy Risks and benefits of enlarging liver lesion biopsy was discussed with the patient and/or patient's family including, but not limited to bleeding, infection, damage to adjacent structures or low yield requiring additional tests.  All of the questions were answered and there is agreement to proceed. Consent signed and in chart.   Thank you for this interesting consult.  I greatly enjoyed meeting KHASIR WOODROME and look forward to participating in their care.  A copy of this report was sent to the requesting provider  on this date.  Electronically Signed: Lavonia Drafts, PA-C 11/24/2020, 6:59 AM   I spent a total of  30 Minutes   in face to face in clinical consultation, greater than 50% of which was counseling/coordinating care for enlarging liver lesion re biopsy

## 2020-11-24 NOTE — Procedures (Signed)
Interventional Radiology Procedure Note  Procedure: US guided biopsy of liver mass bx, mx 18g core.  Complications: None EBL: None Recommendations: - Bedrest 2 hours.   - Routine wound care - Follow up pathology - Advance diet   Signed,  Corrie Mckusick, DO

## 2020-11-26 LAB — SURGICAL PATHOLOGY

## 2020-11-27 ENCOUNTER — Telehealth: Payer: Self-pay | Admitting: Internal Medicine

## 2020-11-27 NOTE — Telephone Encounter (Signed)
Rs 6/27 appt per provider request. Moved to 6/24. Called and left msg

## 2020-11-28 ENCOUNTER — Telehealth: Payer: Self-pay | Admitting: Pharmacist

## 2020-11-28 ENCOUNTER — Other Ambulatory Visit: Payer: Self-pay

## 2020-11-28 ENCOUNTER — Other Ambulatory Visit (HOSPITAL_COMMUNITY): Payer: Self-pay

## 2020-11-28 ENCOUNTER — Telehealth: Payer: Self-pay

## 2020-11-28 ENCOUNTER — Encounter: Payer: Self-pay | Admitting: *Deleted

## 2020-11-28 ENCOUNTER — Inpatient Hospital Stay (HOSPITAL_BASED_OUTPATIENT_CLINIC_OR_DEPARTMENT_OTHER): Payer: Medicare Other | Admitting: Internal Medicine

## 2020-11-28 VITALS — BP 127/88 | HR 66 | Temp 98.9°F | Resp 19 | Ht 74.0 in | Wt 152.7 lb

## 2020-11-28 DIAGNOSIS — R590 Localized enlarged lymph nodes: Secondary | ICD-10-CM | POA: Diagnosis not present

## 2020-11-28 DIAGNOSIS — I10 Essential (primary) hypertension: Secondary | ICD-10-CM | POA: Diagnosis not present

## 2020-11-28 DIAGNOSIS — C3431 Malignant neoplasm of lower lobe, right bronchus or lung: Secondary | ICD-10-CM | POA: Diagnosis not present

## 2020-11-28 DIAGNOSIS — Z79899 Other long term (current) drug therapy: Secondary | ICD-10-CM | POA: Diagnosis not present

## 2020-11-28 DIAGNOSIS — C3491 Malignant neoplasm of unspecified part of right bronchus or lung: Secondary | ICD-10-CM | POA: Diagnosis not present

## 2020-11-28 DIAGNOSIS — F0631 Mood disorder due to known physiological condition with depressive features: Secondary | ICD-10-CM

## 2020-11-28 DIAGNOSIS — Z9221 Personal history of antineoplastic chemotherapy: Secondary | ICD-10-CM | POA: Diagnosis not present

## 2020-11-28 DIAGNOSIS — C3490 Malignant neoplasm of unspecified part of unspecified bronchus or lung: Secondary | ICD-10-CM | POA: Diagnosis not present

## 2020-11-28 DIAGNOSIS — K222 Esophageal obstruction: Secondary | ICD-10-CM | POA: Diagnosis not present

## 2020-11-28 DIAGNOSIS — C787 Secondary malignant neoplasm of liver and intrahepatic bile duct: Secondary | ICD-10-CM | POA: Diagnosis not present

## 2020-11-28 DIAGNOSIS — C3411 Malignant neoplasm of upper lobe, right bronchus or lung: Secondary | ICD-10-CM | POA: Diagnosis not present

## 2020-11-28 DIAGNOSIS — Z5111 Encounter for antineoplastic chemotherapy: Secondary | ICD-10-CM

## 2020-11-28 DIAGNOSIS — Z923 Personal history of irradiation: Secondary | ICD-10-CM | POA: Diagnosis not present

## 2020-11-28 MED ORDER — MIRTAZAPINE 30 MG PO TABS: 30.0000 mg | ORAL_TABLET | Freq: Every day | ORAL | 2 refills | Status: DC

## 2020-11-28 MED ORDER — LUMAKRAS 120 MG PO TABS
960.0000 mg | ORAL_TABLET | Freq: Every day | ORAL | 2 refills | Status: DC
  Filled 2020-11-28: qty 240, fill #0

## 2020-11-28 NOTE — Telephone Encounter (Signed)
This nurse received a call from patients wife.  Patient was sen by Dr. Julien Nordmann today and they are requesting a call from MD because they have some follow up questions.  This nurse advised that Dr. Julien Nordmann was not in the office at the time and her questions would be forwarded to him .  Patient knows to call clinic with any further questions, concerns or problems.

## 2020-11-28 NOTE — Telephone Encounter (Signed)
Oral Oncology Patient Advocate Encounter   Received notification from [place] [facility] that prior authorization for Lumakras is required.   PA submitted on CoverMyMeds Key [id number] Status is pending   Oral Oncology Clinic will continue to follow.   [place] Patient [place] Phone [phone] Fax [phone] 11/28/2020 12:19 PM

## 2020-11-28 NOTE — Progress Notes (Signed)
Per Dr. Julien Nordmann, he would like patient to get molecular testing. I completed and faxed forms to [facility].

## 2020-11-28 NOTE — Progress Notes (Signed)
DISCONTINUE ON PATHWAY REGIMEN - Non-Small Cell Lung     A cycle is every 28 days:     Durvalumab   **Always confirm dose/schedule in your pharmacy ordering system**  REASON: Disease Progression PRIOR TREATMENT: [id number]: Durvalumab 1,500 mg q28 Days x up to 12 Months TREATMENT RESPONSE: Progressive Disease (PD)  START OFF PATHWAY REGIMEN - Non-Small Cell Lung   [id number]:Sotorasib 960 mg PO Daily D1-28 q28 Days:   A cycle is every 28 days:     Sotorasib   **Always confirm dose/schedule in your pharmacy ordering system**  Patient Characteristics: Stage IV Metastatic, Nonsquamous, Molecular Analysis Completed, Molecular Alteration Present and Targeted Therapy Exhausted OR EGFR Exon 20+ or KRAS G12C+ Present and No Prior Chemo/Immunotherapy OR No Alteration Present, Second Line -  Chemotherapy/Immunotherapy, PS = 0, 1, No Prior PD-1/PD-L1  Inhibitor or Prior PD-1/PD-L1 Inhibitor + Chemotherapy, and Not a Candidate for Immunotherapy Therapeutic Status: Stage IV Metastatic Histology: Nonsquamous Cell Broad Molecular Profiling Status: Molecular Analysis Completed Molecular Analysis Results: EGFR Exon 20 Insertion Present or KRAS G12C Present, and No Prior Chemo/Immunotherapy ECOG Performance Status: 1 Chemotherapy/Immunotherapy Line of Therapy: Second Line Chemotherapy/Immunotherapy Immunotherapy Candidate Status: Not a Candidate for Immunotherapy Prior Immunotherapy Status: Prior PD-1/PD-L1 Inhibitor + Chemotherapy Intent of Therapy: Non-Curative / Palliative Intent, Discussed with Patient

## 2020-11-28 NOTE — Telephone Encounter (Signed)
Oral Oncology Pharmacist Encounter  Received new prescription for Lumakras (sotorasib) for the treatment of metastatic non-small cell lung cancer, KRAS G12C-mutated cancer, planned duration until disease progression or unacceptable drug toxicity.  Prescription dose and frequency assessed for appropriateness. Appropriate for therapy initiation.   CBC w/ Diff and CMP from 11/06/20 assessed, no baseline dose adjustments required.  Current medication list in Epic reviewed, DDIs with Lumakras identified: Patient previously noted to be on pantoprazole - this is contraindicated with Lumakras due to PPIs significantly decreasing efficacy of Lumakras. Patient confirmed he is not currently taking. Patient aware that he can take tums spaced out either 4 hours before or 10 hours after Lumakras administration.   Evaluated chart and no patient barriers to medication adherence noted.   Patient agreement for treatment documented in MD note on 11/28/20.  Prescription has been e-scribed to the [facility] for benefits analysis and approval.  Oral Oncology Clinic will continue to follow for insurance authorization, copayment issues, initial counseling and start date.  Leron Croak, PharmD, BCPS Hematology/Oncology Clinical Pharmacist [facility] [phone] 11/28/2020 10:39 AM

## 2020-11-28 NOTE — Telephone Encounter (Addendum)
Oral Chemotherapy Pharmacist Encounter   I spoke with patient and patient's wife for overview of new oral chemotherapy medication: Lumakras (sotorasib) for the treatment of metastatic non-small cell lung cancer, KRAS G12C-mutated, planned duration until disease progression or unacceptable drug toxicity.   Pt is doing well. Counseled patient on administration, dosing, side effects, monitoring, drug-food interactions, safe handling, storage, and disposal.   Patient will take Lumakras 120 mg tablets, 8 tablets (960 mg total) by mouth daily. Given patient's concern with swallowing this many tablets at once, there is data to support dissolving 8 tablets of sotorasib in 4 oz of water and then immediately drinking solution after tablets have dissolved.    Patient knows to avoid proton pump inhibitors, histamine blockers, and grapefruit/grapefruit juice while on Lumakras.    Start date: patient's wife to call and set up shipment through manufacturer 12/04/20, expected medication start date week of 12/08/20   Side effects include but are not limited to: decrease in blood counts, diarrhea, hepatotoxicity, arthralgias/musculoskeletal pains. Also reviewed rare but serious side effect of interstitial lung disease/pneumonitis that have been reported with use of medication.    Reviewed with patient importance of keeping a medication schedule and plan for any missed doses.   After discussion with patient no patient barriers to medication adherence identified.    Given patient's high copay, will proceed with applying for manufacturer assistance. Obtained patient's signatures on application while in clinic. Patient approved for assistance through [place]. Wife provided phone number to set up shipment (tele: [phone])  All questions answered.  Mr. Aja voiced understanding and appreciation.   Medication education handout given to patient. Patient knows to call the office with questions or concerns. Oral  Chemotherapy Clinic phone number provided to patient.  Leron Croak, PharmD, BCPS Hematology/Oncology Clinical Pharmacist [facility] [phone] 11/28/2020 10:45 AM

## 2020-11-28 NOTE — Telephone Encounter (Signed)
Oral Oncology Patient Advocate Encounter  Prior Authorization for Christopher Harding has been approved.    PA# [id number] Effective dates: 11/28/20 through 11/28/21  Patients co-pay is Christopher Harding Clinic will continue to follow.   [name] Patient [place] Phone [phone] Fax [phone] 11/28/2020 12:21 PM

## 2020-11-28 NOTE — Progress Notes (Signed)
[place] [phone]   [phone]  OFFICE PROGRESS NOTE  Koirala, Dibas, MD [address] [place] [address]  DIAGNOSIS:  Metastatic non-small cell lung cancer initially diagnosed as stage IIIA/B (T3, N1/N2, M0) non-small cell lung cancer, adenocarcinoma.  He presented with a right upper lobe lung mass with direct extension across the major fissure into the superior segment of the right lower lobe and ipsilateral hilar lymphadenopathy.  There is questionable subcarinal lymphadenopathy versus esophageal hypermetabolism this was evaluated by gastroenterology with upper endoscopy on 01/30/2020 and there was no mass lesion or malignancy in that area. The patient was diagnosed in May 2021.    Biomarker Findings Microsatellite status - MS-Stable Tumor Mutational Burden - 9 Muts/Mb Genomic Findings For a complete list of the genes assayed, please refer to the Appendix. KRAS G12C CDKN2A/B p16INK4a S12* NKX2-1 amplification - equivocal? PMS2 R3Q TP53 P14f*38 7 Disease relevant genes with no reportable alterations: ALK, BRAF, EGFR, ERBB2, MET, RET, ROS1   PDL1: 70%   PRIOR THERAPY:  1) Weekly concurrent chemoradiation with carboplatin for an AUC of 2 and paclitaxel 45 mg/m.  First dose expected on 11/26/2019. Status post 7 cycles.   Last dose was given 01/07/2020 with partial response. 2) Consolidation treatment with immunotherapy with Imfinzi 1500 mg IV every 4 weeks.  First dose 02/19/2020. Status post 6 cycles.  This was discontinued secondary to suspicious immunotherapy mediated cholangitis    CURRENT THERAPY: Lumakras (Sotorasib) 960 mg p.o. daily.  Expected to start in the next few days.  INTERVAL HISTORY: Christopher [id number].[id number]. male returns to the clinic today for follow-up visit accompanied by his wife.  The patient continues to complain of increasing fatigue and weakness as well as sleeping most of the time and  difficulty swallowing.  He lost few pounds since his last visit.  He has 2 dental extraction performed on 11/20/2020.  He also continues to have headache mainly in the right lower back of the neck.  He denied having any chest pain but has shortness of breath with exertion with no cough or hemoptysis.  He denied having any fever or chills.  He has no nausea, vomiting, diarrhea or constipation.  He has no fever or chills.  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  The patient is here today for evaluation and discussion of his treatment options.  MEDICAL HISTORY: Past Medical History:  Diagnosis Date   A-fib ([facility]    s/p DCCV 12/07/18   Cancer ([facility]    Depression    situational   Dysrhythmia    Hypertension     ALLERGIES:  has No Known Allergies.  MEDICATIONS:  Current Outpatient Medications  Medication Sig Dispense Refill   metoprolol tartrate (LOPRESSOR) 50 MG tablet Take 50 mg by mouth 2 (two) times daily with a meal.     naproxen sodium (ALEVE) 220 MG tablet Take 220 mg by mouth daily as needed (pain).     pantoprazole (PROTONIX) 40 MG tablet TAKE 1 TABLET BY MOUTH EVERY DAY (Patient not taking: Reported on 11/18/2020) 90 tablet 1   predniSONE (DELTASONE) 20 MG tablet 4 tablet p.o. daily for 1 week followed by 3 tablet p.o. daily for 1 week followed by 2 tablet p.o. daily for 1 week followed by 1  tablet p.o. daily for 1 week followed by 0.5 tablet p.o. daily for 1 week (Patient not taking: Reported on 11/18/2020) 74 tablet 0   XARELTO 20 MG TABS tablet Take 20 mg by mouth daily.     No current facility-administered medications for this visit.    SURGICAL HISTORY:  Past Surgical History:  Procedure Laterality Date   CARDIOVERSION N/A 12/07/2018   Procedure: CARDIOVERSION;  Surgeon: Nishan,  Peter C, MD;  Location: [facility] END[facility];  Service: Cardiovascular;  Laterality: N/A;   FINE NEEDLE ASPIRATION  10/30/2019   Procedure: FINE NEEDLE ASPIRATION (FNA) LINEAR;  Surgeon: Smith, Daniel C, MD;  Location: [facility] ENDOSCOPY;  Service: Pulmonary;;   INGUINAL HERNIA REPAIR Right 01/25/2019   Procedure: RIGHT INGUINAL HERNIA REPAIR WITH MESH;  Surgeon: Blackman, Douglas, MD;  Location: [facility] OR;  Service: General;  Laterality: Right;  TAP BLOCK   INSERTION OF MESH Right 01/25/2019   Procedure: Insertion Of Mesh;  Surgeon: Blackman, Douglas, MD;  Location: [facility] OR;  Service: General;  Laterality: Right;   VIDEO BRONCHOSCOPY WITH ENDOBRONCHIAL ULTRASOUND N/A 10/30/2019   Procedure: VIDEO BRONCHOSCOPY WITH ENDOBRONCHIAL ULTRASOUND;  Surgeon: Smith, Daniel C, MD;  Location: [facility] ENDOS[facility];  Service: Pulmonary;  Laterality: N/A;    REVIEW OF SYSTEMS:  Constitutional: positive for anorexia, fatigue, and weight loss Eyes: negative Ears, nose, mouth, throat, and face: negative Respiratory: negative Cardiovascular: negative Gastrointestinal: positive for dysphagia Genitourinary:negative Integument/breast: negative Hematologic/lymphatic: negative Musculoskeletal:negative Neurological: positive for headaches Behavioral/Psych: negative Endocrine: negative Allergic/Immunologic: negative   PHYSICAL EXAMINATION: General appearance: alert, cooperative, fatigued, and no distress Head: Normocephalic, without obvious abnormality, atraumatic Neck: no adenopathy, no JVD, supple, symmetrical, trachea midline, and thyroid not enlarged, symmetric, no tenderness/mass/nodules Lymph nodes: Cervical, supraclavicular, and axillary nodes normal. Resp: clear to auscultation bilaterally Back: symmetric, no curvature. ROM normal. No CVA tenderness. Cardio: regular rate and rhythm, S1, S2 normal, no murmur, click, rub or gallop GI: soft, non-tender; bowel sounds normal; no masses,  no organomegaly Extremities: extremities normal,  atraumatic, no cyanosis or edema Neurologic: Alert and oriented X 3, normal strength and tone. Normal symmetric reflexes. Normal coordination and gait  ECOG PERFORMANCE STATUS: 1 - Symptomatic but completely ambulatory  Blood pressure 127/88, pulse 66, temperature 98.9 F (37.2 C), temperature source Tympanic, resp. rate 19, height 6' 2" (1.88 m), weight 152 lb 11.2 oz (69.3 kg), SpO2 100 %.  LABORATORY DATA: Lab Results  Component Value Date   WBC 4.7 11/24/2020   HGB 13.2 11/24/2020   HCT 38.3 (L) 11/24/2020   MCV 97.0 11/24/2020   PLT 269 11/24/2020      Chemistry      Component Value Date/Time   NA 133 (L) 11/06/2020 0911   NA 129 (L) 11/24/2018 1520   K 4.2 11/06/2020 0911   CL 98 11/06/2020 0911   CO2 21 (L) 11/06/2020 0911   BUN <4 (L) 11/06/2020 0911   BUN 8 11/24/2018 1520   CREATININE 0.69 11/06/2020 0911      Component Value Date/Time   CALCIUM 9.2 11/06/2020 0911   ALKPHOS 105 11/06/2020 0911   AST 26 11/06/2020 0911   ALT 7 11/06/2020 0911   BILITOT 0.4 11/06/2020 0911       RADIOGRAPHIC STUDIES: CT Chest W Contrast  Result Date: 11/06/2020 CLINICAL DATA:  Metastatic lung cancer restaging EXAM: CT CHEST, ABDOMEN, AND PELVIS WITH CONTRAST TECHNIQUE: Multidetector CT imaging of the chest, abdomen and pelvis was performed following the standard protocol during bolus administration of intravenous contrast. CONTRAST:    100mL OMNIPAQUE IOHEXOL 300 MG/ML SOLN, additional oral enteric contrast COMPARISON:  CT chest, 08/08/2020, PET-CT, 11/12/2019 FINDINGS: CT CHEST FINDINGS Cardiovascular: Aortic atherosclerosis. Normal heart size. Scattered left coronary artery calcifications. No pericardial effusion. Mediastinum/Nodes: No enlarged mediastinal, hilar, or axillary lymph nodes. Thyroid gland, trachea, and esophagus demonstrate no significant findings. Lungs/Pleura: No significant interval change in post treatment appearance of the right chest, with dense, bandlike  consolidation, fibrosis, and volume loss of the perihilar right lung, particularly the paramedian right upper lobe (series 6, image 54, 71). Background of very fine centrilobular nodularity, most concentrated in the lung apices. No pleural effusion or pneumothorax. Musculoskeletal: No chest wall mass or suspicious bone lesions identified. CT ABDOMEN PELVIS FINDINGS Hepatobiliary: Marked interval increase in size of a rim enhancing, hypodense mass of the anterior midline liver, hepatic segment IVA/B, measuring 6.5 x 5.1 cm, previously 3.7 x 3.3 cm when measured similarly (series 2, image 63). There is associated segmental biliary ductal dilatation (series 2, image 60). There is an additional rim enhancing, internally hypodense lesion of the inferior tip of the right lobe of the liver, hepatic segment VII, measuring 2.1 x 2.0 cm (series 2, image 82). This was not previously included in the field of view on prior imaging of the chest. No gallstones, gallbladder wall thickening, or biliary dilatation. Pancreas: Unremarkable. No pancreatic ductal dilatation or surrounding inflammatory changes. Spleen: Normal in size without significant abnormality. Adrenals/Urinary Tract: Adrenal glands are unremarkable. Kidneys are normal, without renal calculi, solid lesion, or hydronephrosis. Mild thickening of the urinary bladder wall, likely due to chronic outlet obstruction. Stomach/Bowel: Stomach is within normal limits. Appendix appears normal. No evidence of bowel wall thickening, distention, or inflammatory changes. Severe descending and sigmoid diverticulosis. Vascular/Lymphatic: Aortic atherosclerosis. No enlarged abdominal or pelvic lymph nodes. Reproductive: Prostatomegaly Other: No abdominal wall hernia or abnormality. No abdominopelvic ascites. Musculoskeletal: No acute or significant osseous findings. IMPRESSION: 1. Marked interval increase in size of a rim enhancing, hypodense mass of the anterior midline liver, hepatic  segment IVA/B, measuring 6.5 x 5.1 cm, previously 3.7 x 3.3 cm when measured similarly. There is associated segmental biliary ductal dilatation. 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 2. There is an additional rim enhancing, internally hypodense lesion of the inferior tip of the right lobe of the liver, hepatic segment VII, measuring 2.1 x 2.0 cm. This is consistent with an additional metastasis but was not previously included in the field of view on prior imaging of the chest. 3. No significant interval change in post treatment appearance of the right chest, with dense, bandlike consolidation, fibrosis, and volume loss. 4. Background of very fine centrilobular nodularity, most concentrated in the lung apices, consistent with smoking-related respiratory bronchiolitis. 5. Coronary artery disease. Aortic Atherosclerosis (ICD10-I70.0). Electronically Signed   By: Alex  Bibbey M.D.   On: 11/06/2020 15:04   CT Abdomen Pelvis W Contrast  Result Date: 11/06/2020 CLINICAL DATA:  Metastatic lung cancer restaging EXAM: CT CHEST, ABDOMEN, AND PELVIS WITH CONTRAST TECHNIQUE: Multidetector CT imaging of the chest, abdomen and pelvis was performed following the standard protocol during bolus administration of intravenous contrast. CONTRAST:  100mL OMNIPAQUE IOHEXOL 300 MG/ML SOLN, additional oral enteric contrast COMPARISON:  CT chest, 08/08/2020, PET-CT, 11/12/2019 FINDINGS: CT CHEST FINDINGS Cardiovascular: Aortic atherosclerosis. Normal heart size. Scattered left coronary artery calcifications. No pericardial effusion. Mediastinum/Nodes: No enlarged mediastinal, hilar, or axillary lymph nodes. Thyroid gland, trachea, and esophagus demonstrate no significant findings. Lungs/Pleura: No significant interval change in post treatment appearance of   the right chest, with dense, bandlike consolidation, fibrosis, and volume loss of the perihilar right lung, particularly the paramedian right upper lobe  (series 6, image 54, 71). Background of very fine centrilobular nodularity, most concentrated in the lung apices. No pleural effusion or pneumothorax. Musculoskeletal: No chest wall mass or suspicious bone lesions identified. CT ABDOMEN PELVIS FINDINGS Hepatobiliary: Marked interval increase in size of a rim enhancing, hypodense mass of the anterior midline liver, hepatic segment IVA/B, measuring 6.5 x 5.1 cm, previously 3.7 x 3.3 cm when measured similarly (series 2, image 63). There is associated segmental biliary ductal dilatation (series 2, image 60). There is an additional rim enhancing, internally hypodense lesion of the inferior tip of the right lobe of the liver, hepatic segment VII, measuring 2.1 x 2.0 cm (series 2, image 82). This was not previously included in the field of view on prior imaging of the chest. No gallstones, gallbladder wall thickening, or biliary dilatation. Pancreas: Unremarkable. No pancreatic ductal dilatation or surrounding inflammatory changes. Spleen: Normal in size without significant abnormality. Adrenals/Urinary Tract: Adrenal glands are unremarkable. Kidneys are normal, without renal calculi, solid lesion, or hydronephrosis. Mild thickening of the urinary bladder wall, likely due to chronic outlet obstruction. Stomach/Bowel: Stomach is within normal limits. Appendix appears normal. No evidence of bowel wall thickening, distention, or inflammatory changes. Severe descending and sigmoid diverticulosis. Vascular/Lymphatic: Aortic atherosclerosis. No enlarged abdominal or pelvic lymph nodes. Reproductive: Prostatomegaly Other: No abdominal wall hernia or abnormality. No abdominopelvic ascites. Musculoskeletal: No acute or significant osseous findings. IMPRESSION: 1. Marked interval increase in size of a rim enhancing, hypodense mass of the anterior midline liver, hepatic segment IVA/B, measuring 6.5 x 5.1 cm, previously 3.7 x 3.3 cm when measured similarly. There is associated  segmental biliary ductal dilatation. 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 2. There is an additional rim enhancing, internally hypodense lesion of the inferior tip of the right lobe of the liver, hepatic segment VII, measuring 2.1 x 2.0 cm. This is consistent with an additional metastasis but was not previously included in the field of view on prior imaging of the chest. 3. No significant interval change in post treatment appearance of the right chest, with dense, bandlike consolidation, fibrosis, and volume loss. 4. Background of very fine centrilobular nodularity, most concentrated in the lung apices, consistent with smoking-related respiratory bronchiolitis. 5. Coronary artery disease. Aortic Atherosclerosis (ICD10-I70.0). Electronically Signed   By: Alex  Bibbey M.D.   On: 11/06/2020 15:04   US BIOPSY (LIVER)  Result Date: 11/24/2020 INDICATION: 70-year-old male with a history liver mass concerning for carcinoma. Prior biopsy 08/20/2020 was negative for carcinoma. Additional history of non-small cell lung cancer EXAM: ULTRASOUND-GUIDED LIVER MASS BIOPSY MEDICATIONS: None. ANESTHESIA/SEDATION: Moderate (conscious) sedation was employed during this procedure. A total of Versed 1.0 mg and Fentanyl 50 mcg was administered intravenously. Moderate Sedation Time: 15 minutes. The patient's level of consciousness and vital signs were monitored continuously by radiology nursing throughout the procedure under my direct supervision. FLUOROSCOPY TIME:  Ultrasound COMPLICATIONS: None PROCEDURE: Informed written consent was obtained from the patient after a thorough discussion of the procedural risks, benefits and alternatives. All questions were addressed. Maximal Sterile Barrier Technique was utilized including caps, mask, sterile gowns, sterile gloves, sterile drape, hand hygiene and skin antiseptic. A timeout was performed prior to the initiation of the procedure. Ultrasound survey of the  right liver lobe performed with images stored and sent to PACs. Tumor of the left liver was identified. The ultrasound characteristics   are mixed hypoechoic and hyperechoic, fairly vague on ultrasound imaging when compared to the prior CT imaging. The right lower thorax/right upper abdomen was prepped with chlorhexidine in a sterile fashion, and a sterile drape was applied covering the operative field. A sterile gown and sterile gloves were used for the procedure. Local anesthesia was provided with 1% Lidocaine. The patient was prepped and draped sterilely and the skin and subcutaneous tissues were generously infiltrated with 1% lidocaine. A 17 gauge introducer needle was then advanced under ultrasound guidance in subxiphoid location into the left liver lobe. The stylet was removed, and multiple separate 18 gauge core biopsy were retrieved. Samples were placed into formalin for transportation to the lab. Gel-Foam pledgets were then infused with a small amount of saline for assistance with hemostasis. The needle was removed, and a final ultrasound image was performed. The patient tolerated the procedure well and remained hemodynamically stable throughout. No complications were encountered and no significant blood loss was encounter. IMPRESSION: Status post ultrasound-guided biopsy of left liver mass. Signed, Christopher S. Wagner, DO, RPVI [facility] Electronically Signed   By: Christopher  Harding D.O.   On: 11/24/2020 09:53     ASSESSMENT AND PLAN: This is a very pleasant 70 years old white male with metastatic non-small cell lung cancer initially diagnosed as stage IIIA/B (T3, N1/N2, M0) non-small cell lung cancer, adenocarcinoma presented with right upper lobe lung mass with direct extension across the major fissure into the superior segment of the right lower lobe and ipsilateral hilar lymphadenopathy as well as questionable subcarinal lymphadenopathy.  The  patient had evidence for disease metastasis confirmed in June 2022. The patient completed a course of concurrent chemoradiation with weekly carboplatin and paclitaxel status post 7 cycles.  He tolerated this treatment well with no concerning adverse effect except for mild sore throat and mouth. The patient had partial response to this treatment. The patient is currently undergoing a course of consolidation treatment with immunotherapy with Imfinzi 1500 mg IV every 4 weeks status post 6 cycles.  He has been tolerating his treatment well with no concerning adverse effects. His last CT scan of the chest several months ago showed no concerning findings for disease progression in the chest but the patient has a highly suspicious liver lesion concerning for metastatic disease.  The patient underwent ultrasound-guided core biopsy of this lesion. The final pathology read at [facility] in addition to [facility] showed no evidence of malignancy and there was suspicious inflammatory process of biliary origin. He has been on observation and repeat CT scan of the chest, abdomen pelvis performed few weeks ago showed further increase in the size of the suspicious liver metastasis.  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 I had a lengthy discussion with the patient and his wife today about his current condition and treatment options. I recommended to send the new tissue block for molecular studies to confirm the the molecular status of his disease. The patient and his wife have no interest in chemotherapy. I discussed with the patient his treatment options including second line treatment with Lumakras (Sotorasib) 960 mg p.o. daily.  The patient will see the pharmacist for oral oncolytic today for  education as well as helping the patient receiving his medication.  There may be an issue with the   swallowing of the 8 capsules every day because of the dysphagia.  He will also need to stop his treatment with Protonix because of the drug drug interaction with Lumakras (Sotorasib) and significant decrease in the absorption of Lumakras (Sotorasib). I will refer the patient back to his gastroenterologist Dr. Beavers for consideration of esophageal dilatation. For the lack of appetite and depression, I will start the patient on Remeron 30 mg p.o. nightly. For the persistent headache, I will arrange for the patient to have repeat MRI of the brain to rule out brain metastasis. The patient will come back for follow-up visit in 2-3 weeks for evaluation and management of any adverse effect of his treatment. He was advised to call immediately if he has any other concerning symptoms in the interval.  The patient voices understanding of current disease status and treatment options and is in agreement with the current care plan.  All questions were answered. The patient knows to call the clinic with any problems, questions or concerns. We can certainly see the patient much sooner if necessary.  Disclaimer: This note was dictated with voice recognition software. Similar sounding words can inadvertently be transcribed and may not be corrected upon review.        

## 2020-12-01 ENCOUNTER — Telehealth: Payer: Self-pay | Admitting: Medical Oncology

## 2020-12-01 ENCOUNTER — Other Ambulatory Visit: Payer: Self-pay | Admitting: Internal Medicine

## 2020-12-01 ENCOUNTER — Inpatient Hospital Stay: Payer: Medicare Other | Admitting: Internal Medicine

## 2020-12-01 DIAGNOSIS — C3491 Malignant neoplasm of unspecified part of right bronchus or lung: Secondary | ICD-10-CM

## 2020-12-01 NOTE — Telephone Encounter (Signed)
Returned [employer]. She requested a second opinion at [facility].

## 2020-12-02 ENCOUNTER — Telehealth: Payer: Self-pay

## 2020-12-02 ENCOUNTER — Telehealth: Payer: Self-pay | Admitting: Internal Medicine

## 2020-12-02 NOTE — Telephone Encounter (Signed)
Oral Oncology Patient Advocate Encounter  Met patient in lobby to complete application for [employer] in an effort to reduce patient's out of pocket expense for Lumakras to $0.    Application completed and faxed to [phone].   [employer] patient assistance phone number for follow up is [phone].   This encounter will be updated until final determination.   [place] Patient [place] Phone [phone] Fax [phone] 12/02/2020 10:26 AM

## 2020-12-02 NOTE — Telephone Encounter (Signed)
Scheduled per los. Called and spoke with patients wife. Confirmed appt

## 2020-12-02 NOTE — Telephone Encounter (Signed)
Wife's concern: "Christopher Harding is struggling. He said he is worse day by day. What do we do if he cannot tolerate the Lumakras . Is there a plan B? How will we know if the [facility] is working? I told her he needs to take the medication first to see how he tolerates it and how he feels and keep appts with [facility] after he goes to [place].  I told her that his appt at [facility] will be next week per Webb Laws at [facility].

## 2020-12-04 ENCOUNTER — Other Ambulatory Visit: Payer: Self-pay | Admitting: Internal Medicine

## 2020-12-04 ENCOUNTER — Telehealth: Payer: Self-pay | Admitting: Medical Oncology

## 2020-12-04 MED ORDER — OXYCODONE HCL 5 MG PO TABS: 5.0000 mg | ORAL_TABLET | Freq: Three times a day (TID) | ORAL | 0 refills | Status: DC | PRN

## 2020-12-04 NOTE — Telephone Encounter (Signed)
Uncontrolled pain  " RUQ , back , he is not getting out of bed, not driving. He needs something for pain".  [facility] appt -next thursday

## 2020-12-04 NOTE — Telephone Encounter (Signed)
Patient is approved for Lumakras at no cost from [place] 12/04/20-06/06/21  [employer]  uses Rx [place] Patient [place] Phone [phone] Fax [phone] 12/04/2020 2:41 PM

## 2020-12-09 ENCOUNTER — Other Ambulatory Visit: Payer: Self-pay

## 2020-12-09 ENCOUNTER — Ambulatory Visit (HOSPITAL_COMMUNITY)
Admission: RE | Admit: 2020-12-09 | Discharge: 2020-12-09 | Disposition: A | Discharge status: Home / Self Care | Payer: Medicare Other | Source: Ambulatory Visit | Attending: Internal Medicine | Admitting: Internal Medicine

## 2020-12-09 DIAGNOSIS — C3490 Malignant neoplasm of unspecified part of unspecified bronchus or lung: Secondary | ICD-10-CM | POA: Diagnosis not present

## 2020-12-09 DIAGNOSIS — I725 Aneurysm of other precerebral arteries: Secondary | ICD-10-CM | POA: Diagnosis not present

## 2020-12-09 DIAGNOSIS — I639 Cerebral infarction, unspecified: Secondary | ICD-10-CM | POA: Diagnosis not present

## 2020-12-09 IMAGING — MR MR HEAD WO/W CM
14 series · 48 of 48 positions shown · IV contrast (gadavist)
Comparison: [DATE]

CLINICAL DATA: Lung cancer, staging

EXAM:
MRI HEAD WITHOUT AND WITH CONTRAST
TECHNIQUE: Multiplanar, multiecho pulse sequences of the brain and surrounding
structures were obtained without and with intravenous contrast.
CONTRAST:  7mL GADAVIST GADOBUTROL 1 MMOL/ML IV SOLN

[Series 5: DWI · axial · 3.0mm · 1.36mm/px · z∈[-97,+41]mm · 6 of 95 slices shown (1 of 2)]
[im 1/95]
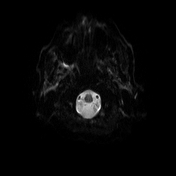
[im 19/95]
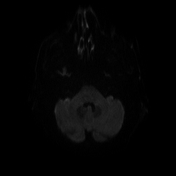
[im 38/95]
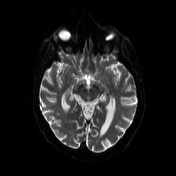
[im 57/95]
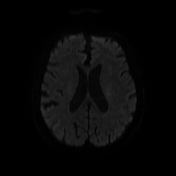
[im 76/95]
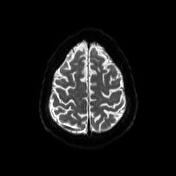
[im 95/95]
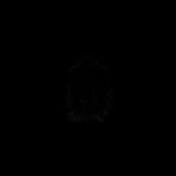

[Series 6: DWI · axial · 3.0mm · 1.36mm/px · z∈[-97,+41]mm · 4 of 48 slices shown (2 of 2)]
[im 1/48]
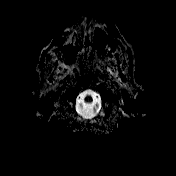
[im 16/48]
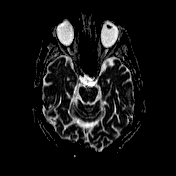
[im 32/48]
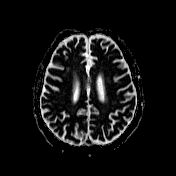
[im 48/48]
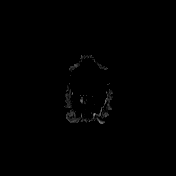

[Series 7: T1 · sagittal · 5.0mm · 0.75mm/px · 1 of 24 slices shown (1 of 2)]
[im 1/24]
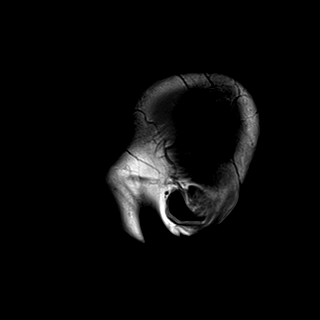

[Series 8: T2 · axial · 5.0mm · 0.62mm/px · 1 of 26 slices shown]
[im 1/26]
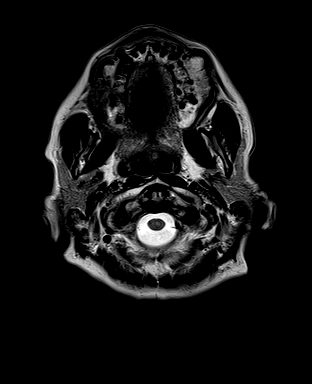

[Series 9: FLAIR · axial · 3.0mm · 0.75mm/px · z∈[-100,+50]mm · 3 of 52 slices shown]
[im 1/52]
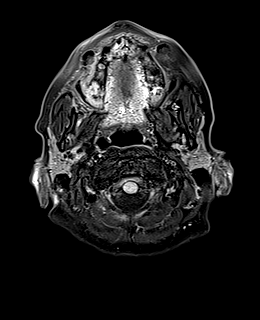
[im 26/52]
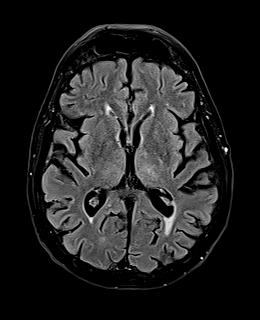
[im 52/52]
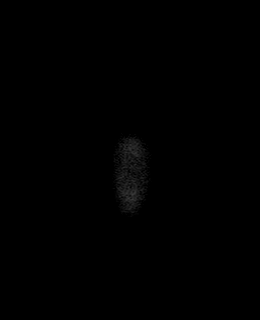

[Series 10: swi_images · axial · 3.0mm · 0.75mm/px · z∈[-112,+61]mm · 3 of 60 slices shown]
[im 1/60]
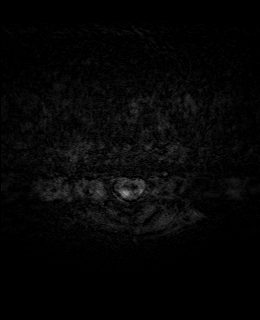
[im 30/60]
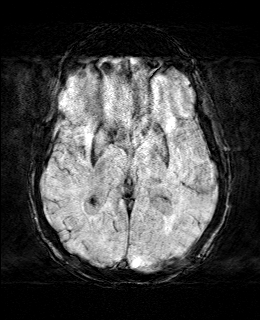
[im 60/60]
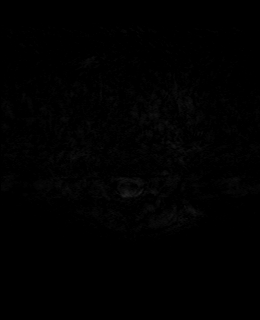

[Series 11: mip_images(sw) · axial · 24.0mm · 0.75mm/px · z∈[-101,+51]mm · 3 of 53 slices shown]
[im 1/53]
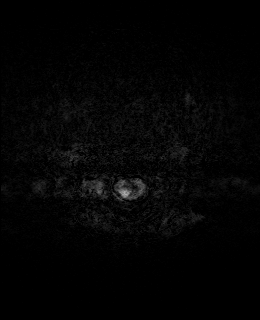
[im 27/53]
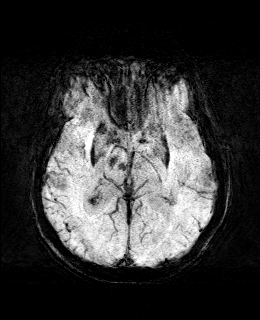
[im 53/53]
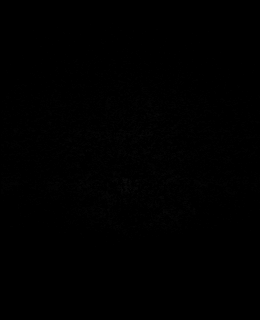

[Series 12: T1 · axial · 1.0mm · 0.94mm/px · z∈[-103,+53]mm · 9 of 160 slices shown (2 of 2)]
[im 1/160]
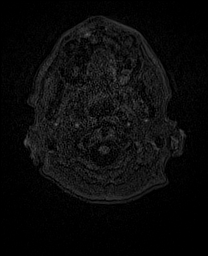
[im 20/160]
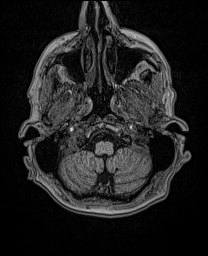
[im 40/160]
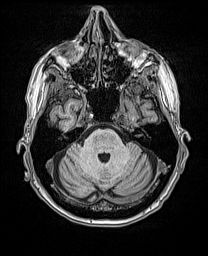
[im 60/160]
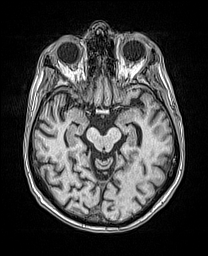
[im 80/160]
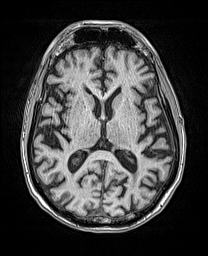
[im 100/160]
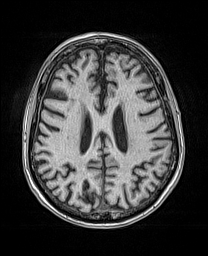
[im 120/160]
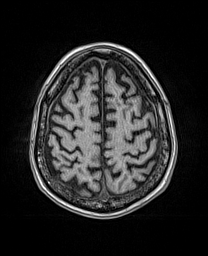
[im 140/160]
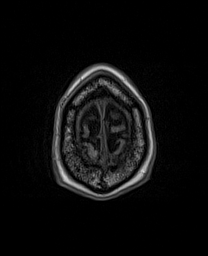
[im 160/160]
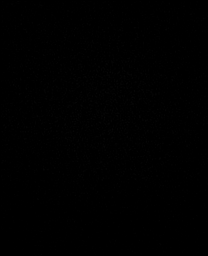

[Series 13: cor dwi_tracew · coronal · 5.0mm · 1.53mm/px · 3 of 52 slices shown]
[im 1/52]
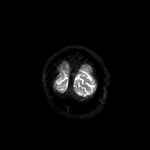
[im 26/52]
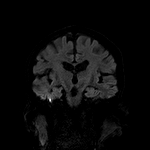
[im 52/52]
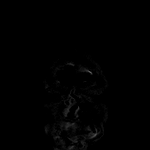

[Series 14: cor dwi_adc · coronal · 5.0mm · 1.53mm/px · 1 of 26 slices shown]
[im 1/26]
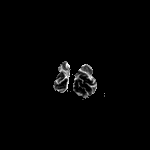

[Series 15: T2 post-contrast · coronal · 5.0mm · 0.57mm/px · 2 of 30 slices shown]
[im 1/30]
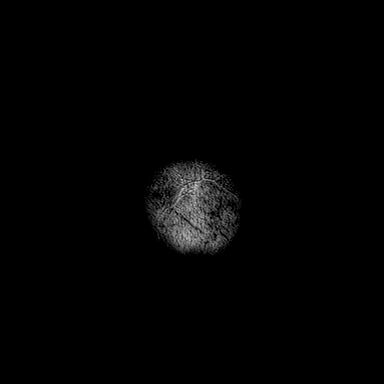
[im 30/30]
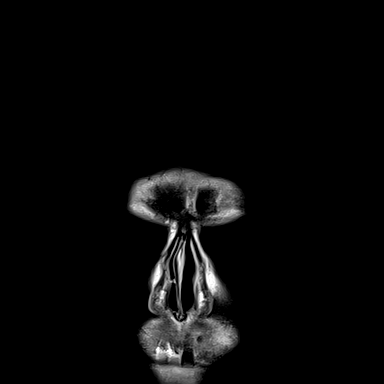

[Series 16: T1 post-contrast · axial · 1.0mm · 0.94mm/px · z∈[-103,+53]mm · 9 of 160 slices shown (1 of 3)]
[im 1/160]
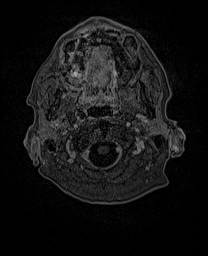
[im 20/160]
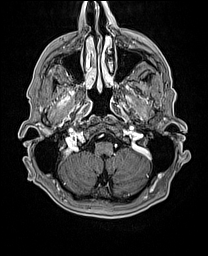
[im 40/160]
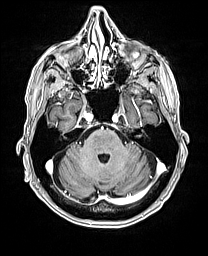
[im 60/160]
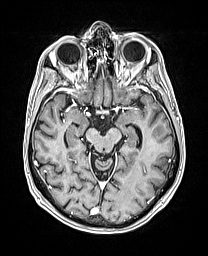
[im 80/160]
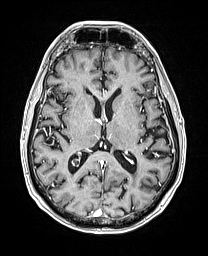
[im 100/160]
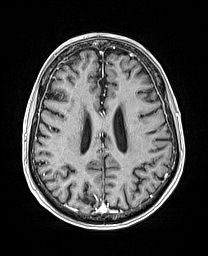
[im 120/160]
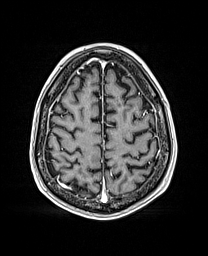
[im 140/160]
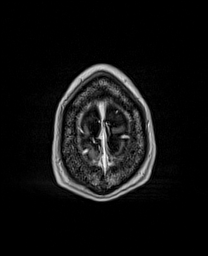
[im 160/160]
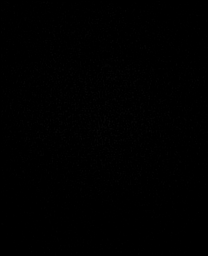

[Series 17: T1 post-contrast · coronal · 5.0mm · 0.43mm/px · 2 of 30 slices shown (2 of 3)]
[im 1/30]
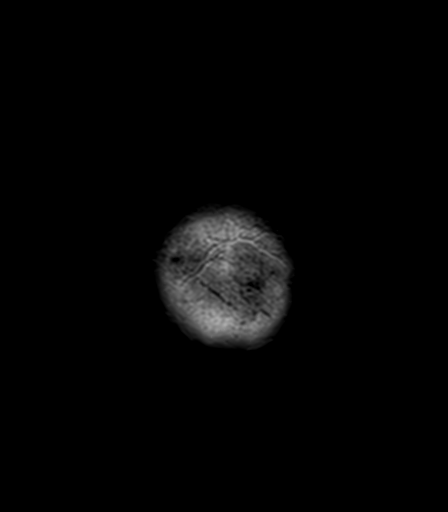
[im 30/30]
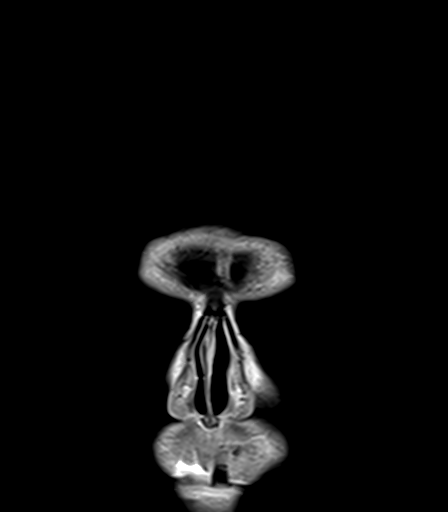

[Series 18: T1 post-contrast · sagittal · 5.0mm · 0.75mm/px · 1 of 24 slices shown (3 of 3)]
[im 1/24]
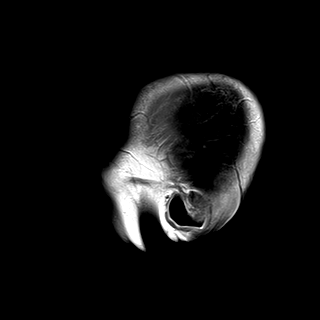

[48 of 48 positions shown; findings below may reference images not displayed]

FINDINGS: Brain: There is no acute infarction or intracranial hemorrhage.
There is no intracranial mass, mass effect, or edema. There is no
hydrocephalus or extra-axial fluid collection.

Prominence of the ventricles and sulci reflects generalized
parenchymal volume loss. Patchy and confluent areas of T2
hyperintensity in the supratentorial and pontine white matter are
nonspecific but probably reflect mild to moderate chronic
microvascular ischemic changes. There are small chronic right
cerebellar infarcts.

No abnormal enhancement.

Vascular: Major vessel flow voids at the skull base are preserved. 9
x 8 mm basilar tip aneurysm. No substantial change from the prior
study allowing for differences in measurement technique.

Skull and upper cervical spine: Normal marrow signal is preserved.

Sinuses/Orbits: Paranasal sinuses are aerated. Right lens
replacement.

Other: Sella is unremarkable.  Mastoid air cells are clear.
IMPRESSION: No evidence of intracranial metastatic disease.

Stable chronic findings detailed above.

No substantial change in basilar tip aneurysm.

## 2020-12-09 MED ORDER — GADOBUTROL 1 MMOL/ML IV SOLN
7.0000 mL | Freq: Once | INTRAVENOUS | Status: AC | PRN
  Administered 2020-12-09: 7 mL via INTRAVENOUS

## 2020-12-12 DIAGNOSIS — C3411 Malignant neoplasm of upper lobe, right bronchus or lung: Secondary | ICD-10-CM | POA: Diagnosis not present

## 2020-12-12 DIAGNOSIS — C787 Secondary malignant neoplasm of liver and intrahepatic bile duct: Secondary | ICD-10-CM | POA: Diagnosis not present

## 2020-12-15 ENCOUNTER — Encounter (HOSPITAL_COMMUNITY): Payer: Self-pay

## 2020-12-15 ENCOUNTER — Telehealth: Payer: Self-pay | Admitting: Medical Oncology

## 2020-12-15 NOTE — Telephone Encounter (Signed)
Constipation-LBM Tuesday before starting Lumakras  Miralax 1 capful bid x 3 days-has not been effective. Denies nausea.   I recommended he take Senakot -s tonight and keep appt with [name] in am.  I recommended pt get magnesium sulfate OTC to have on hand.

## 2020-12-16 ENCOUNTER — Other Ambulatory Visit: Payer: Self-pay

## 2020-12-16 ENCOUNTER — Encounter (HOSPITAL_COMMUNITY): Payer: Self-pay

## 2020-12-16 ENCOUNTER — Inpatient Hospital Stay: Payer: Medicare Other | Attending: Physician Assistant | Admitting: Internal Medicine

## 2020-12-16 ENCOUNTER — Inpatient Hospital Stay: Payer: Medicare Other

## 2020-12-16 VITALS — BP 139/82 | HR 66 | Temp 98.2°F | Resp 17 | Ht 74.0 in | Wt 147.3 lb

## 2020-12-16 DIAGNOSIS — Z923 Personal history of irradiation: Secondary | ICD-10-CM | POA: Insufficient documentation

## 2020-12-16 DIAGNOSIS — Z9221 Personal history of antineoplastic chemotherapy: Secondary | ICD-10-CM | POA: Diagnosis not present

## 2020-12-16 DIAGNOSIS — R634 Abnormal weight loss: Secondary | ICD-10-CM | POA: Insufficient documentation

## 2020-12-16 DIAGNOSIS — C3411 Malignant neoplasm of upper lobe, right bronchus or lung: Secondary | ICD-10-CM | POA: Diagnosis not present

## 2020-12-16 DIAGNOSIS — C3491 Malignant neoplasm of unspecified part of right bronchus or lung: Secondary | ICD-10-CM | POA: Diagnosis not present

## 2020-12-16 DIAGNOSIS — R590 Localized enlarged lymph nodes: Secondary | ICD-10-CM | POA: Diagnosis not present

## 2020-12-16 DIAGNOSIS — Z5111 Encounter for antineoplastic chemotherapy: Secondary | ICD-10-CM

## 2020-12-16 DIAGNOSIS — R630 Anorexia: Secondary | ICD-10-CM | POA: Insufficient documentation

## 2020-12-16 DIAGNOSIS — F0631 Mood disorder due to known physiological condition with depressive features: Secondary | ICD-10-CM | POA: Diagnosis not present

## 2020-12-16 DIAGNOSIS — Z79899 Other long term (current) drug therapy: Secondary | ICD-10-CM | POA: Insufficient documentation

## 2020-12-16 DIAGNOSIS — C787 Secondary malignant neoplasm of liver and intrahepatic bile duct: Secondary | ICD-10-CM | POA: Diagnosis not present

## 2020-12-16 DIAGNOSIS — C3490 Malignant neoplasm of unspecified part of unspecified bronchus or lung: Secondary | ICD-10-CM | POA: Diagnosis not present

## 2020-12-16 DIAGNOSIS — C3431 Malignant neoplasm of lower lobe, right bronchus or lung: Secondary | ICD-10-CM | POA: Insufficient documentation

## 2020-12-16 LAB — CBC WITH DIFFERENTIAL (CANCER CENTER ONLY)
Abs Immature Granulocytes: 0.02 10*3/uL (ref 0.00–0.07)
Basophils Absolute: 0 10*3/uL (ref 0.0–0.1)
Basophils Relative: 1 %
Eosinophils Absolute: 0 10*3/uL (ref 0.0–0.5)
Eosinophils Relative: 1 %
HCT: 35.4 % — ABNORMAL LOW (ref 39.0–52.0)
Hemoglobin: 12.8 g/dL — ABNORMAL LOW (ref 13.0–17.0)
Immature Granulocytes: 1 %
Lymphocytes Relative: 10 %
Lymphs Abs: 0.4 10*3/uL — ABNORMAL LOW (ref 0.7–4.0)
MCH: 33 pg (ref 26.0–34.0)
MCHC: 36.2 g/dL — ABNORMAL HIGH (ref 30.0–36.0)
MCV: 91.2 fL (ref 80.0–100.0)
Monocytes Absolute: 0.7 10*3/uL (ref 0.1–1.0)
Monocytes Relative: 16 %
Neutro Abs: 3.1 10*3/uL (ref 1.7–7.7)
Neutrophils Relative %: 71 %
Platelet Count: 210 10*3/uL (ref 150–400)
RBC: 3.88 MIL/uL — ABNORMAL LOW (ref 4.22–5.81)
RDW: 13 % (ref 11.5–15.5)
WBC Count: 4.3 10*3/uL (ref 4.0–10.5)
nRBC: 0 % (ref 0.0–0.2)

## 2020-12-16 LAB — CMP (CANCER CENTER ONLY)
ALT: 8 U/L (ref 0–44)
AST: 19 U/L (ref 15–41)
Albumin: 3 g/dL — ABNORMAL LOW (ref 3.5–5.0)
Alkaline Phosphatase: 136 U/L — ABNORMAL HIGH (ref 38–126)
Anion gap: 10 (ref 5–15)
BUN: 10 mg/dL (ref 8–23)
CO2: 21 mmol/L — ABNORMAL LOW (ref 22–32)
Calcium: 9.1 mg/dL (ref 8.9–10.3)
Chloride: 94 mmol/L — ABNORMAL LOW (ref 98–111)
Creatinine: 0.72 mg/dL (ref 0.61–1.24)
GFR, Estimated: >60 mL/min (ref >60)
Glucose, Bld: 93 mg/dL (ref 70–99)
Potassium: 4.3 mmol/L (ref 3.5–5.1)
Sodium: 125 mmol/L — ABNORMAL LOW (ref 135–145)
Total Bilirubin: 0.7 mg/dL (ref 0.3–1.2)
Total Protein: 7.1 g/dL (ref 6.5–8.1)

## 2020-12-16 MED ORDER — METHYLPREDNISOLONE 4 MG PO TBPK: ORAL_TABLET | ORAL | 0 refills | Status: DC

## 2020-12-16 MED ORDER — MORPHINE SULFATE ER 30 MG PO TBCR: 30.0000 mg | EXTENDED_RELEASE_TABLET | Freq: Two times a day (BID) | ORAL | 0 refills | Status: DC

## 2020-12-16 NOTE — Progress Notes (Signed)
[place] [phone]   [phone]  OFFICE PROGRESS NOTE  Koirala, Dibas, MD [address] [place] [address]  DIAGNOSIS:  Metastatic non-small cell lung cancer initially diagnosed as stage IIIA/B (T3, N1/N2, M0) non-small cell lung cancer, adenocarcinoma.  He presented with a right upper lobe lung mass with direct extension across the major fissure into the superior segment of the right lower lobe and ipsilateral hilar lymphadenopathy.  There is questionable subcarinal lymphadenopathy versus esophageal hypermetabolism this was evaluated by gastroenterology with upper endoscopy on 01/30/2020 and there was no mass lesion or malignancy in that area. The patient was diagnosed in May 2021.    Biomarker Findings Microsatellite status - MS-Stable Tumor Mutational Burden - 9 Muts/Mb Genomic Findings For a complete list of the genes assayed, please refer to the Appendix. KRAS G12C CDKN2A/B [id number] S12* NKX2-1 amplification - equivocal? PMS2 R3Q TP53 P35f*38 7 Disease relevant genes with no reportable alterations: ALK, BRAF, EGFR, ERBB2, MET, RET, ROS1   PDL1: 70%  REPEAT MOLECULAR STUDIES BY [facility] ON THE LIVER BIOPSY:  Results with Therapy Associations BIOMARKER METHOD ANALYTE RESULT THERAPY ASSOCIATION BIOMARKER LEVEL* . alectinib, ceritinib, crizotinib, lorlatinib Level 1 . IHC Protein Negative  0 brigatinib Level 2 . ALK Seq RNA-Tumor Fusion Not Detected LACK OF BENEFIT alectinib, brigatinib, ceritinib, crizotinib, lorlatinib Level 2 .BRAF Seq DNA-Tumor Mutation Not Detected LACK OF BENEFIT dabrafenib and trametinib combination therapy, vemurafenib Level 2 .EGFR Seq DNA-Tumor Mutation Not Detected LACK OF BENEFIT erlotinib, gefitinib Level 2 .KRAS Seq DNA-Tumor Mutation Not Detected LACK OF BENEFIT sotorasib Level 2 .RET Seq RNA-Tumor Fusion Not Detected LACK OF BENEFIT pralsetinib, selpercatinib Level  2 .ROS1 Seq RNA-Tumor Fusion Not Detected LACK OF BENEFIT ceritinib, crizotinib, entrectinib, lorlatinib Level 2 . CNA-Seq DNA-Tumor Amplification Not Detected .MET Seq RNA-Tumor Variant Transcript Not Detected LACK OFBENEFIT crizotinib Level 3  PRIOR THERAPY:  1) Weekly concurrent chemoradiation with carboplatin for an AUC of 2 and paclitaxel 45 mg/m.  First dose expected on 11/26/2019. Status post 7 cycles.   Last dose was given 01/07/2020 with partial response. 2) Consolidation treatment with immunotherapy with Imfinzi 1500 mg IV every 4 weeks.  First dose 02/19/2020. Status post 6 cycles.  This was discontinued secondary to suspicious immunotherapy mediated cholangitis    CURRENT THERAPY: Lumakras (Sotorasib) 960 mg p.o. daily.  First dose started December 12, 2020.  INTERVAL HISTORY: Christopher DELAUDER70y.o. male returns to the clinic today for follow-up visit accompanied by his wife.  The patient continues to complain of increasing fatigue and weakness as well as weight loss and lack of appetite.  He also has pain in the epigastric area and he is currently on oxycodone with minimal relief.  He denied having any nausea or vomiting but has constipation for several days.  He has no current chest pain, shortness of breath, cough or hemoptysis.  He has no fever or chills.  He started his treatment with Lumakras (Sotorasib) 4 days ago and he continues to tolerate the treatment well with no concerning adverse effects.  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  The patient is here today for evaluation and management of his condition.  MEDICAL HISTORY: Past Medical History:  Diagnosis Date   [place] 12/07/18   Cancer ([place])    Depression    situational   Dysrhythmia    Hypertension     ALLERGIES:   has No Known Allergies.  MEDICATIONS:  Current Outpatient Medications  Medication Sig Dispense Refill   metoprolol tartrate (LOPRESSOR) 50 MG tablet Take 50 mg by mouth 2 (two) times daily with a meal.     mirtazapine (REMERON) 30 MG tablet Take 1 tablet (30 mg total) by mouth at bedtime. 30 tablet 2   naproxen sodium (ALEVE) 220 MG tablet Take 220 mg by mouth daily as needed (pain).     oxyCODONE (OXY IR/ROXICODONE) 5 MG immediate release tablet Take 1 tablet (5 mg total) by mouth 3 (three) times daily as needed for severe pain. 30 tablet 0   predniSONE (DELTASONE) 20 MG tablet 4 tablet p.o. daily for 1 week followed by 3 tablet p.o. daily for 1 week followed by 2 tablet p.o. daily for 1 week followed by 1 tablet p.o. daily for 1 week followed by 0.5 tablet p.o. daily for 1 week 74 tablet 0   sotorasib (LUMAKRAS) 120 MG TABS Take 8 tablets (960 mg) by mouth daily. 240 tablet 2   XARELTO 20 MG TABS tablet Take 20 mg by mouth daily.     No current facility-administered medications for this visit.    SURGICAL HISTORY:  Past Surgical History:  Procedure Laterality Date   CARDIOVERSION N/A 12/07/2018   Procedure: CARDIOVERSION;  Surgeon: Josue Hector, MD;  Location: [place];  Service: Cardiovascular;  Laterality: N/A;   FINE NEEDLE ASPIRATION  10/30/2019   Procedure: FINE NEEDLE ASPIRATION (FNA) LINEAR;  Surgeon: Candee Furbish, MD;  Location: [facility] ENDOSCOPY;  Service: Pulmonary;;   INGUINAL HERNIA REPAIR Right 01/25/2019   Procedure: RIGHT INGUINAL HERNIA REPAIR WITH MESH;  Surgeon: Coralie Keens, MD;  Location: [place];  Service: General;  Laterality: Right;  TAP BLOCK   INSERTION OF MESH Right 01/25/2019   Procedure: Insertion Of Mesh;  Surgeon: Coralie Keens, MD;  Location: [place];  Service: General;  Laterality: Right;   VIDEO BRONCHOSCOPY WITH ENDOBRONCHIAL ULTRASOUND N/A 10/30/2019   Procedure: VIDEO BRONCHOSCOPY WITH ENDOBRONCHIAL ULTRASOUND;  Surgeon: Candee Furbish, MD;   Location[facility] ENDOSCOPY;  Service: Pulmonary;  Laterality: N/A;    REVIEW OF SYSTEMS:  Constitutional: positive for anorexia, fatigue, and weight loss Eyes: negative Ears, nose, mouth, throat, and face: negative Respiratory: negative Cardiovascular: negative Gastrointestinal: positive for abdominal pain, constipation, and dysphagia Genitourinary:negative Integument/breast: negative Hematologic/lymphatic: negative Musculoskeletal:positive for muscle weakness Neurological: positive for weakness Behavioral/Psych: negative Endocrine: negative Allergic/Immunologic: negative   PHYSICAL EXAMINATION: General appearance: alert, cooperative, fatigued, and no distress Head: Normocephalic, without obvious abnormality, atraumatic Neck: no adenopathy, no JVD, supple, symmetrical, trachea midline, and thyroid not enlarged, symmetric, no tenderness/mass/nodules Lymph nodes: Cervical, supraclavicular, and axillary nodes normal. Resp: clear to auscultation bilaterally Back: symmetric, no curvature. ROM normal. No CVA tenderness. Cardio: regular rate and rhythm, S1, S2 normal, no murmur, click, rub or gallop GI: Tender to palpation in the epigastric area Extremities: extremities normal, atraumatic, no cyanosis or edema Neurologic: Alert and oriented X 3, normal strength and tone. Normal symmetric reflexes. Normal coordination and gait  ECOG PERFORMANCE STATUS: 1 - Symptomatic but completely ambulatory  Blood pressure 139/82, pulse 66, temperature 98.2 F (36.8 C), temperature source Oral, resp. rate 17, height _0  (1.88 m), weight 147 lb 4.8 oz (66.8 kg), SpO2 98 %.  LABORATORY DATA: Lab Results  Component Value  Date   WBC 4.3 12/16/2020   HGB 12.8 (L) 12/16/2020   HCT 35.4 (L) 12/16/2020   MCV 91.2 12/16/2020   PLT 210 12/16/2020      Chemistry      Component Value Date/Time   NA 133 (L) 11/06/2020 0911   NA 129 (L) 11/24/2018 1520   K 4.2 11/06/2020 0911   CL 98 11/06/2020 0911    CO2 21 (L) 11/06/2020 0911   BUN <4 (L) 11/06/2020 0911   BUN 8 11/24/2018 1520   CREATININE 0.69 11/06/2020 0911      Component Value Date/Time   CALCIUM 9.2 11/06/2020 0911   ALKPHOS 105 11/06/2020 0911   AST 26 11/06/2020 0911   ALT 7 11/06/2020 0911   BILITOT 0.4 11/06/2020 0911       RADIOGRAPHIC STUDIES: MR BRAIN W WO CONTRAST  Result Date: 12/10/2020 CLINICAL DATA:  Lung cancer, staging EXAM: MRI HEAD WITHOUT AND WITH CONTRAST TECHNIQUE: Multiplanar, multiecho pulse sequences of the brain and surrounding structures were obtained without and with intravenous contrast. CONTRAST:  50m GADAVIST GADOBUTROL 1 MMOL/ML IV SOLN COMPARISON:  June 2021 FINDINGS: Brain: There is no acute infarction or intracranial hemorrhage. There is no intracranial mass, mass effect, or edema. There is no hydrocephalus or extra-axial fluid collection. Prominence of the ventricles and sulci reflects generalized parenchymal volume loss. Patchy and confluent areas of T2 hyperintensity in the supratentorial and pontine white matter are nonspecific but probably reflect mild to moderate chronic microvascular ischemic changes. 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 No abnormal enhancement. Vascular: Major vessel flow voids at the skull base are preserved. 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 No substantial change from the prior study allowing for differences in measurement technique. Skull and upper cervical spine: Normal marrow signal is preserved. Sinuses/Orbits: Paranasal sinuses are aerated. Right lens replacement. Other: Sella is unremarkable.  Mastoid air cells are clear. IMPRESSION: No evidence of intracranial metastatic disease. Stable chronic findings detailed above. 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 Electronically Signed   By: PMacy MisM.D.   On: 12/10/2020 11:42   [place]LIVER[place]  Result Date: 11/24/2020 INDICATION: 70year old male with a history liver mass concerning for carcinoma.  Prior biopsy 08/20/2020 was negative for carcinoma. Additional history of non-small cell lung cancer EXAM: ULTRASOUND-GUIDED LIVER MASS BIOPSY MEDICATIONS: None. ANESTHESIA/SEDATION: Moderate (conscious) sedation was employed during this procedure. A total of Versed 1.0 mg and Fentanyl 50 mcg was administered intravenously. Moderate Sedation Time: 15 minutes. The patient's level of consciousness and vital signs were monitored continuously by radiology nursing throughout the procedure under my direct supervision. FLUOROSCOPY TIME:  Ultrasound COMPLICATIONS: None PROCEDURE: Informed written consent was obtained from the patient after a thorough discussion of the procedural risks, benefits and alternatives. All questions were addressed. Maximal Sterile Barrier Technique was utilized including caps, mask, sterile gowns, sterile gloves, sterile drape, hand hygiene and skin antiseptic. A timeout was performed prior to the initiation of the procedure. Ultrasound survey of the right liver lobe performed with images stored and sent to PACs. Tumor of the left liver was identified. The ultrasound characteristics are mixed hypoechoic and hyperechoic, fairly vague on ultrasound imaging when compared to the prior CT imaging. The right lower thorax/right upper abdomen was prepped with chlorhexidine in a sterile fashion, and a sterile drape was applied covering the operative field. A sterile gown and sterile gloves were used for the procedure. Local anesthesia was provided with 1% Lidocaine. The patient was prepped and  draped sterilely and the skin and subcutaneous tissues were generously infiltrated with 1% lidocaine. A 17 gauge introducer needle was then advanced under ultrasound guidance in subxiphoid location into the left liver lobe. The stylet was removed, and multiple separate 18 gauge core biopsy were retrieved. Samples were placed into formalin for transportation to the lab. Gel-Foam pledgets were then infused with a small  amount of saline for assistance with hemostasis. The needle was removed, and a final ultrasound image was performed. The patient tolerated the procedure well and remained hemodynamically stable throughout. No complications were encountered and no significant blood loss was encounter. IMPRESSION: Status post ultrasound-guided biopsy of left liver mass. Signed, Dulcy Fanny. Dellia Nims, RPVI [facility] Electronically Signed   By: Corrie Mckusick D.O.   On: 11/24/2020 09:53     ASSESSMENT AND PLAN: This is a very pleasant 70 years old white male with metastatic non-small cell lung cancer initially diagnosed as stage IIIA/B (T3, N1/N2, M0) non-small cell lung cancer, adenocarcinoma presented with right upper lobe lung mass with direct extension across the major fissure into the superior segment of the right lower lobe and ipsilateral hilar lymphadenopathy as well as questionable subcarinal lymphadenopathy.  The patient had evidence for disease metastasis confirmed in June 2022. The patient completed a course of concurrent chemoradiation with weekly carboplatin and paclitaxel status post 7 cycles.  He tolerated this treatment well with no concerning adverse effect except for mild sore throat and mouth. The patient had partial response to this treatment. The patient is currently undergoing a course of consolidation treatment with immunotherapy with Imfinzi 1500 mg IV every 4 weeks status post 6 cycles.  He has been tolerating his treatment well with no concerning adverse effects. His last CT scan of the chest several months ago showed no concerning findings for disease progression in the chest but the patient has a highly suspicious liver lesion concerning for metastatic disease.  The patient underwent ultrasound-guided core biopsy of this lesion. The final pathology read at [facility] in addition to [facility] showed no evidence of malignancy  and there was suspicious inflammatory process of biliary origin. He has been on observation and repeat CT scan of the chest, abdomen pelvis performed few weeks ago showed further increase in the size of the suspicious liver metastasis.  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 He has no interest in systemic chemotherapy.  The patient is started treatment with Lumakras (Sotorasib) 960 mg p.o. daily 4 days ago and has been tolerating this treatment well. He continues to have increasing fatigue and weakness as well as lack of appetite and weight loss.  He also has significant pain in the epigastric area. 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 I recommended for the patient to continue his current treatment with the [place] [facility] [place] (Sotorasib) for now. The patient was also referred to [facility] for second opinion. For the epigastric pain, he was referred to Dr. Tarri Glenn with gastroenterology. For pain management I will  adjust his pain medication by adding MS Contin 30 mg p.o. every 12 hours in addition to the oxycodone for breakthrough pain. For the previous headache, MRI of the brain showed no evidence of metastatic disease to the brain. For the lack of appetite and weight loss, he will continue his current treatment with Remeron and I added Medrol Dosepak today. The patient will have blood work in 2 weeks and I will see him back for follow-up visit in 1 months  with repeat CT scan of the chest, abdomen pelvis for restaging of his disease. He was advised to call immediately if he has any concerning symptoms in the interval. The patient voices understanding of current disease status and treatment options and is in agreement with the current care plan.  All questions were answered. The patient knows to call the clinic with any problems, questions or concerns. We can certainly see the patient much sooner if necessary.  Disclaimer: This note was dictated with voice recognition software. Similar sounding words can inadvertently be transcribed and may not be corrected upon review.

## 2020-12-17 ENCOUNTER — Telehealth: Payer: Self-pay

## 2020-12-17 ENCOUNTER — Other Ambulatory Visit: Payer: Self-pay | Admitting: Internal Medicine

## 2020-12-17 MED ORDER — MORPHINE SULFATE ER 30 MG PO TBCR: 30.0000 mg | EXTENDED_RELEASE_TABLET | Freq: Two times a day (BID) | ORAL | 0 refills | Status: DC

## 2020-12-17 NOTE — Telephone Encounter (Signed)
Pt's wife called stating MS CONTIN was not available at [facility] because they are saying it was not sent over. I called [facility] and spoke with pharmacist who states on their end it is not showing we sent it. We have confirmation receipt showing it was received 12/16/20 at 4:40 PM. Pharmacist states MD will need to resend. MD aware and will do this. Wife is aware and verbalizes thanks.

## 2020-12-17 NOTE — Telephone Encounter (Signed)
Notified Patient that prior authorization request for approval of Morphine Sulfate ER 30mg  tablets had been forwarded to insurance company. Awaiting response.

## 2020-12-18 ENCOUNTER — Telehealth: Payer: Self-pay

## 2020-12-18 NOTE — Telephone Encounter (Signed)
Patient notified of Prior Authorization approval for Morphine Sulfate 30mg  ER tablets. Authorization good from 12/17/2020 through 12/17/2021

## 2020-12-19 ENCOUNTER — Telehealth: Payer: Self-pay | Admitting: Internal Medicine

## 2020-12-19 NOTE — Telephone Encounter (Signed)
Scheduled per los. Called and spoke with patients wife. Confirmed appts 

## 2020-12-20 ENCOUNTER — Other Ambulatory Visit: Payer: Self-pay | Admitting: Internal Medicine

## 2020-12-31 ENCOUNTER — Inpatient Hospital Stay: Payer: Medicare Other

## 2020-12-31 ENCOUNTER — Telehealth: Payer: Self-pay | Admitting: Internal Medicine

## 2020-12-31 NOTE — Telephone Encounter (Signed)
Cancelled appt per 7/27 sch msg. Spoke to pt's wife who declined to r/s appt as pt is coming in for labs next week.

## 2021-01-05 ENCOUNTER — Telehealth: Payer: Self-pay | Admitting: Physician Assistant

## 2021-01-05 NOTE — Telephone Encounter (Signed)
Please send to [employer] nurse [facility]) thanks

## 2021-01-05 NOTE — Telephone Encounter (Signed)
Patients wife called states he is having a lot of BM issues chronic constipation and there is no more Magnesium available in the stores (It's been recalled). Pain medication is helping but they are seeking advise. Wondering if he can take Magnesium sulfate powder.

## 2021-01-05 NOTE — Telephone Encounter (Signed)
The pt's wife states that the pt has constipation from pain medications.  He was taking mag citrate, however, that has been recalled. I have recommended the pt try miralax 2 doses twice daily and titrate as needed.  She will call back if this dose not produce results.

## 2021-01-09 ENCOUNTER — Telehealth: Payer: Self-pay | Admitting: Medical Oncology

## 2021-01-09 NOTE — Telephone Encounter (Signed)
Wife asking when to pick up contrast for CT . I returned her call but no answer.

## 2021-01-12 ENCOUNTER — Other Ambulatory Visit: Payer: Self-pay | Admitting: Internal Medicine

## 2021-01-12 ENCOUNTER — Encounter (HOSPITAL_COMMUNITY): Payer: Self-pay

## 2021-01-12 ENCOUNTER — Inpatient Hospital Stay: Payer: Medicare Other | Attending: Physician Assistant

## 2021-01-12 ENCOUNTER — Ambulatory Visit (HOSPITAL_COMMUNITY)
Admission: RE | Admit: 2021-01-12 | Discharge: 2021-01-12 | Disposition: A | Discharge status: Home / Self Care | Payer: Medicare Other | Source: Ambulatory Visit | Attending: Internal Medicine | Admitting: Internal Medicine

## 2021-01-12 ENCOUNTER — Telehealth: Payer: Self-pay | Admitting: Medical Oncology

## 2021-01-12 ENCOUNTER — Other Ambulatory Visit: Payer: Self-pay

## 2021-01-12 DIAGNOSIS — Z79899 Other long term (current) drug therapy: Secondary | ICD-10-CM | POA: Diagnosis not present

## 2021-01-12 DIAGNOSIS — R630 Anorexia: Secondary | ICD-10-CM | POA: Insufficient documentation

## 2021-01-12 DIAGNOSIS — F32A Depression, unspecified: Secondary | ICD-10-CM | POA: Diagnosis not present

## 2021-01-12 DIAGNOSIS — R911 Solitary pulmonary nodule: Secondary | ICD-10-CM | POA: Diagnosis not present

## 2021-01-12 DIAGNOSIS — N400 Enlarged prostate without lower urinary tract symptoms: Secondary | ICD-10-CM | POA: Insufficient documentation

## 2021-01-12 DIAGNOSIS — R1310 Dysphagia, unspecified: Secondary | ICD-10-CM | POA: Diagnosis not present

## 2021-01-12 DIAGNOSIS — C3490 Malignant neoplasm of unspecified part of unspecified bronchus or lung: Secondary | ICD-10-CM

## 2021-01-12 DIAGNOSIS — Z7901 Long term (current) use of anticoagulants: Secondary | ICD-10-CM | POA: Diagnosis not present

## 2021-01-12 DIAGNOSIS — Z923 Personal history of irradiation: Secondary | ICD-10-CM | POA: Diagnosis not present

## 2021-01-12 DIAGNOSIS — R590 Localized enlarged lymph nodes: Secondary | ICD-10-CM | POA: Diagnosis not present

## 2021-01-12 DIAGNOSIS — I2510 Atherosclerotic heart disease of native coronary artery without angina pectoris: Secondary | ICD-10-CM | POA: Diagnosis not present

## 2021-01-12 DIAGNOSIS — J90 Pleural effusion, not elsewhere classified: Secondary | ICD-10-CM | POA: Diagnosis not present

## 2021-01-12 DIAGNOSIS — C3491 Malignant neoplasm of unspecified part of right bronchus or lung: Secondary | ICD-10-CM

## 2021-01-12 DIAGNOSIS — C3431 Malignant neoplasm of lower lobe, right bronchus or lung: Secondary | ICD-10-CM | POA: Diagnosis not present

## 2021-01-12 DIAGNOSIS — C787 Secondary malignant neoplasm of liver and intrahepatic bile duct: Secondary | ICD-10-CM | POA: Diagnosis not present

## 2021-01-12 DIAGNOSIS — I10 Essential (primary) hypertension: Secondary | ICD-10-CM | POA: Diagnosis not present

## 2021-01-12 DIAGNOSIS — C3411 Malignant neoplasm of upper lobe, right bronchus or lung: Secondary | ICD-10-CM | POA: Diagnosis present

## 2021-01-12 DIAGNOSIS — Z5111 Encounter for antineoplastic chemotherapy: Secondary | ICD-10-CM | POA: Insufficient documentation

## 2021-01-12 DIAGNOSIS — K7689 Other specified diseases of liver: Secondary | ICD-10-CM | POA: Diagnosis not present

## 2021-01-12 DIAGNOSIS — K5900 Constipation, unspecified: Secondary | ICD-10-CM | POA: Diagnosis not present

## 2021-01-12 DIAGNOSIS — M5136 Other intervertebral disc degeneration, lumbar region: Secondary | ICD-10-CM | POA: Diagnosis not present

## 2021-01-12 DIAGNOSIS — I700 Atherosclerosis of aorta: Secondary | ICD-10-CM | POA: Diagnosis not present

## 2021-01-12 DIAGNOSIS — I4891 Unspecified atrial fibrillation: Secondary | ICD-10-CM | POA: Insufficient documentation

## 2021-01-12 LAB — CBC WITH DIFFERENTIAL (CANCER CENTER ONLY)
Abs Immature Granulocytes: 0.02 10*3/uL (ref 0.00–0.07)
Basophils Absolute: 0.1 10*3/uL (ref 0.0–0.1)
Basophils Relative: 1 %
Eosinophils Absolute: 0 10*3/uL (ref 0.0–0.5)
Eosinophils Relative: 1 %
HCT: 37.5 % — ABNORMAL LOW (ref 39.0–52.0)
Hemoglobin: 13.1 g/dL (ref 13.0–17.0)
Immature Granulocytes: 0 %
Lymphocytes Relative: 4 %
Lymphs Abs: 0.3 10*3/uL — ABNORMAL LOW (ref 0.7–4.0)
MCH: 32.3 pg (ref 26.0–34.0)
MCHC: 34.9 g/dL (ref 30.0–36.0)
MCV: 92.6 fL (ref 80.0–100.0)
Monocytes Absolute: 0.8 10*3/uL (ref 0.1–1.0)
Monocytes Relative: 11 %
Neutro Abs: 5.6 10*3/uL (ref 1.7–7.7)
Neutrophils Relative %: 83 %
Platelet Count: 226 10*3/uL (ref 150–400)
RBC: 4.05 MIL/uL — ABNORMAL LOW (ref 4.22–5.81)
RDW: 13.6 % (ref 11.5–15.5)
WBC Count: 6.8 10*3/uL (ref 4.0–10.5)
nRBC: 0 % (ref 0.0–0.2)

## 2021-01-12 LAB — CMP (CANCER CENTER ONLY)
ALT: 26 U/L (ref 0–44)
AST: 50 U/L — ABNORMAL HIGH (ref 15–41)
Albumin: 3.1 g/dL — ABNORMAL LOW (ref 3.5–5.0)
Alkaline Phosphatase: 253 U/L — ABNORMAL HIGH (ref 38–126)
Anion gap: 14 (ref 5–15)
BUN: 5 mg/dL — ABNORMAL LOW (ref 8–23)
CO2: 22 mmol/L (ref 22–32)
Calcium: 9.5 mg/dL (ref 8.9–10.3)
Chloride: 95 mmol/L — ABNORMAL LOW (ref 98–111)
Creatinine: 0.66 mg/dL (ref 0.61–1.24)
GFR, Estimated: >60 mL/min (ref >60)
Glucose, Bld: 105 mg/dL — ABNORMAL HIGH (ref 70–99)
Potassium: 3.9 mmol/L (ref 3.5–5.1)
Sodium: 131 mmol/L — ABNORMAL LOW (ref 135–145)
Total Bilirubin: 1.1 mg/dL (ref 0.3–1.2)
Total Protein: 7.1 g/dL (ref 6.5–8.1)

## 2021-01-12 LAB — TSH: TSH: 1.237 u[IU]/mL (ref 0.320–4.118)

## 2021-01-12 IMAGING — CT CT ABD-PELV W/ CM
2 of 5 series · 12 of 36 positions shown, 15 images · IV contrast (OMNIPAQUE)
Comparison: [DATE]

CLINICAL DATA: Restaging non-small cell lung cancer. Immunotherapy.

EXAM:
CT CHEST, ABDOMEN, AND PELVIS WITH CONTRAST
TECHNIQUE: Multidetector CT imaging of the chest, abdomen and pelvis was
performed following the standard protocol during bolus
administration of intravenous contrast.
CONTRAST:  80mL OMNIPAQUE IOHEXOL 350 MG/ML SOLN

[Series 2: cap with · axial · 0.84mm/px · z∈[-294,+296]mm · 9 of 141 slices shown, 12 images]
[im 15/141  mediastinal]
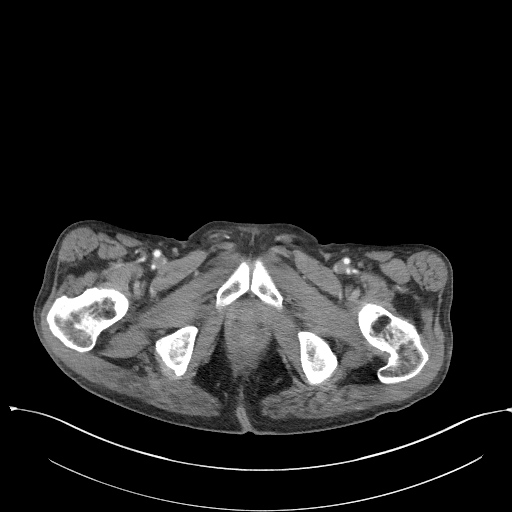
[im 15/141  lung]
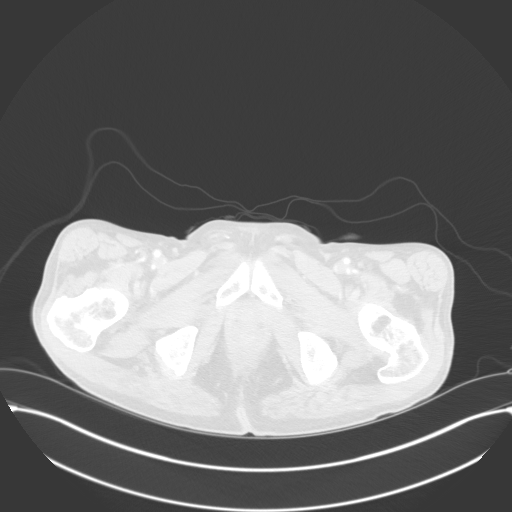
[im 30/141  lung]
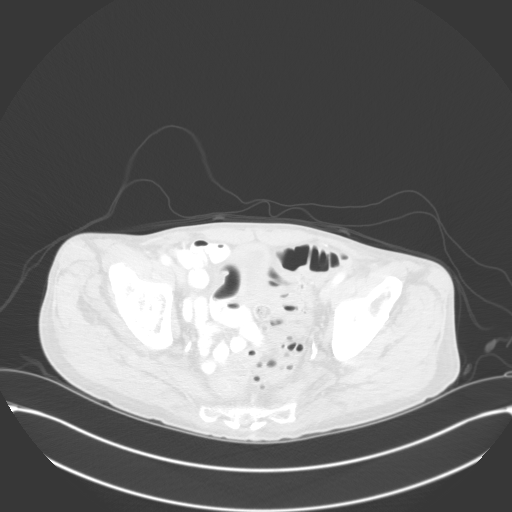
[im 45/141  lung]
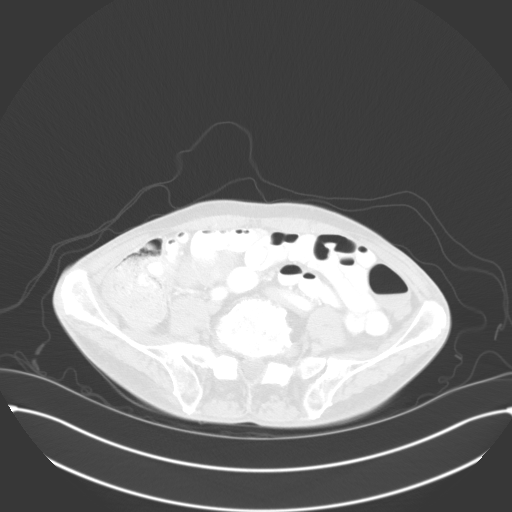
[im 59/141  lung]
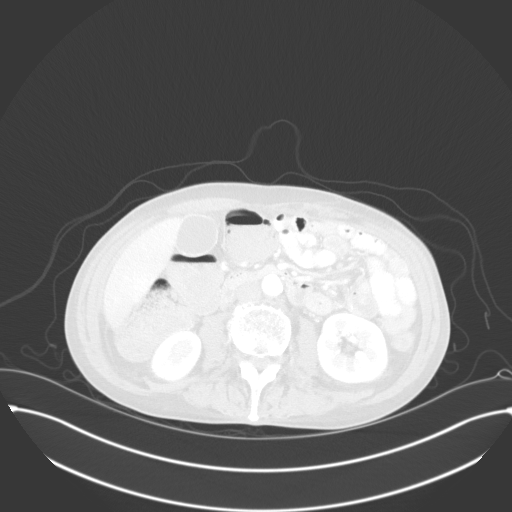
[im 74/141  mediastinal]
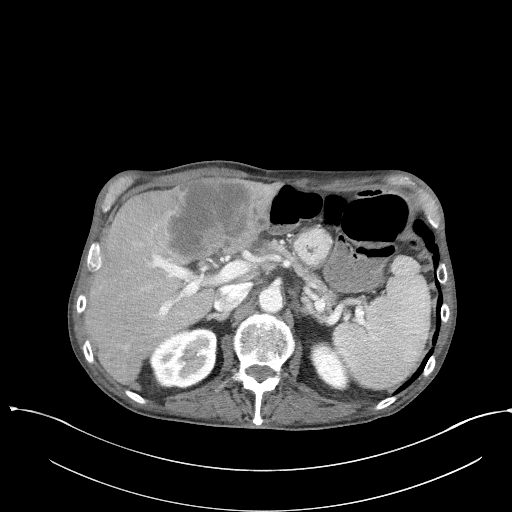
[im 74/141  lung]
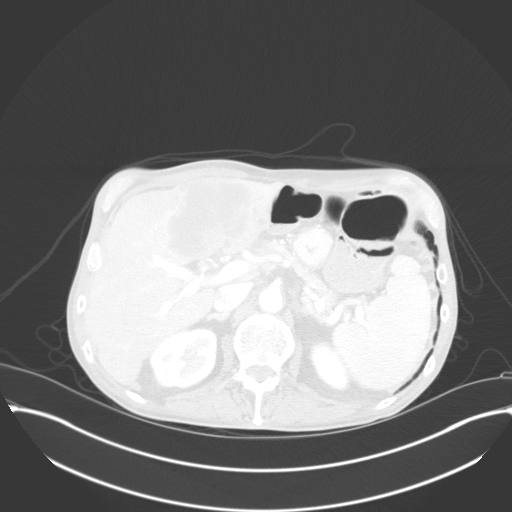
[im 89/141  lung]
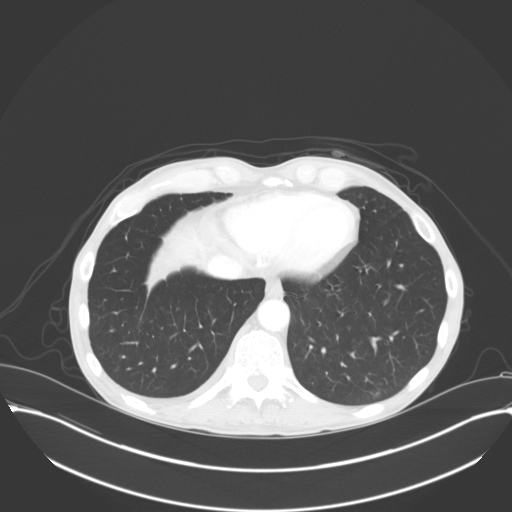
[im 104/141  lung]
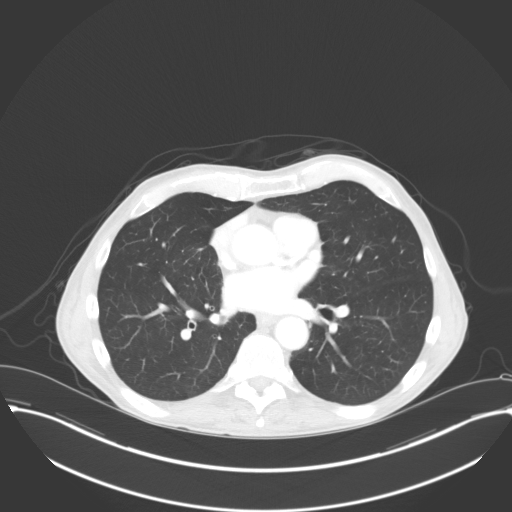
[im 118/141  lung]
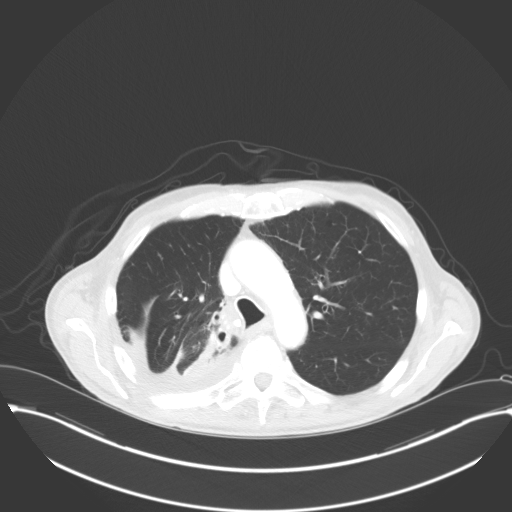
[im 133/141  mediastinal]
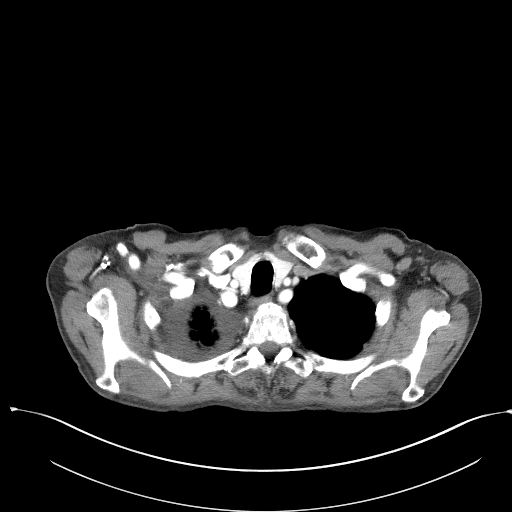
[im 133/141  lung]
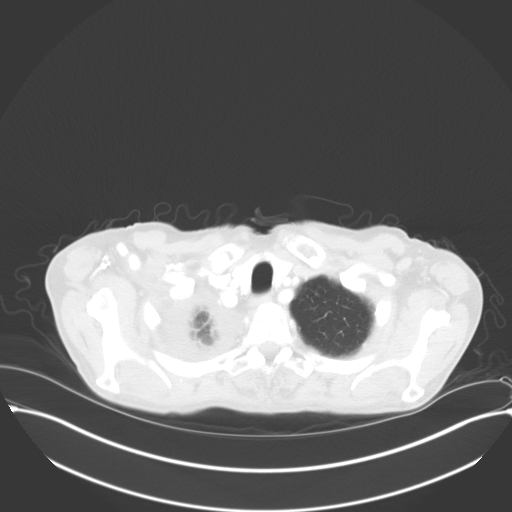

[Series 6: coronals · coronal · 0.84mm/px · 3 of 125 slices shown]
[im 25/125  lung]
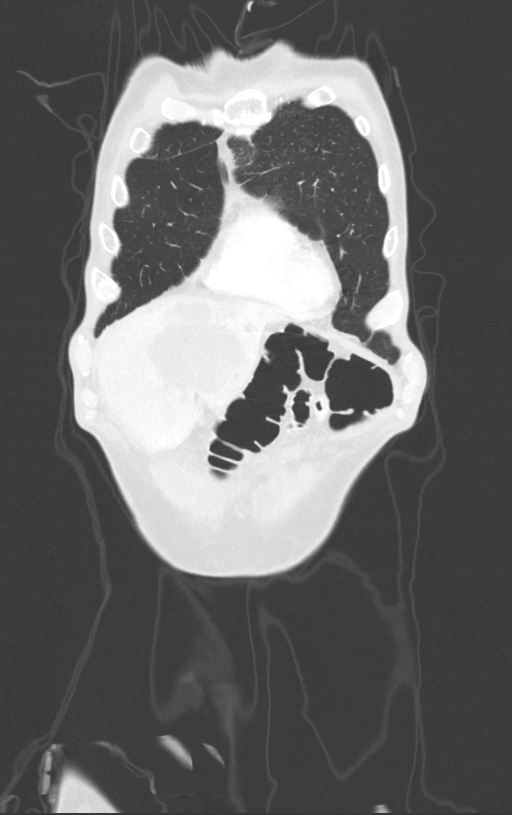
[im 50/125  lung]
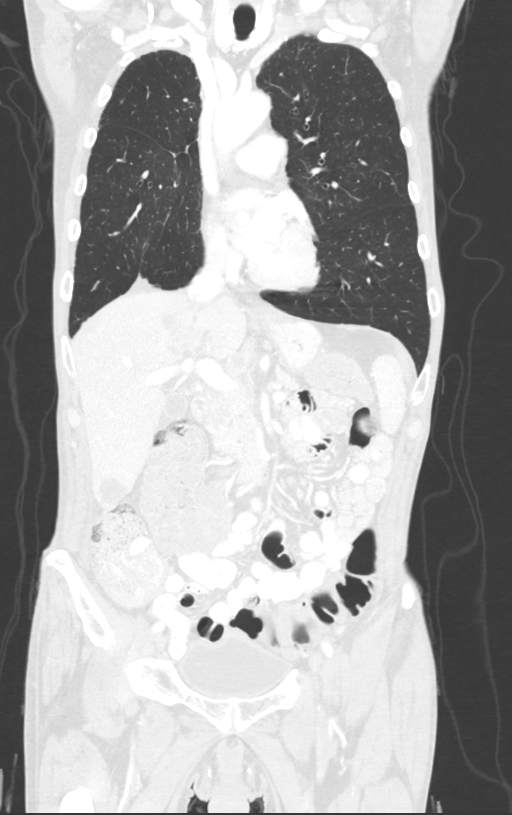
[im 75/125  lung]
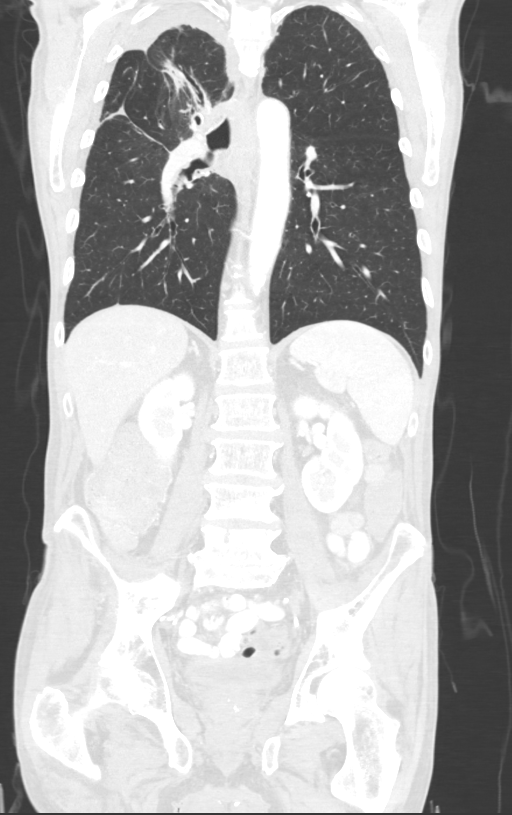

[12 of 36 positions shown; findings below may reference images not displayed]

FINDINGS: CT CHEST FINDINGS

Cardiovascular: Trace amount of gas along the anterior mediastinum
is thought to probably be injection related venous gas based on
distribution. Accordingly this is not thought to represent
pneumomediastinum.

Coronary, aortic arch, and branch vessel atherosclerotic vascular
disease.

Mediastinum/Nodes: No pathologic adenopathy identified.

Lungs/Pleura: Mild increase in the small right pleural effusion
which may have loculated components tracking along the posterior mid
lung and apex.

Similar appearance of bandlike consolidation particularly medially
in the right upper lobe and in the superior segment right lower
lobe. Proximal airway plugging along the right middle lobe bronchus.

Densely calcified right basilar nodule, image 139 series 8, stable.
Stable 3 mm right-sided nodule on image 124 series 8. Bilateral
airway thickening. The 4 by 3 mm left upper lobe nodule on image 56
series 8 previously measured 0.2 cm on [DATE].

Tree-in-bud reticulonodular opacity in the left lower lobe on image
132 series 8 similar to prior and probably from atypical infectious
bronchiolitis given the pattern.

Musculoskeletal: Old right rib deformities from healed fractures.

CT ABDOMEN PELVIS FINDINGS

Hepatobiliary: Further enlargement of the dominant metastatic lesion
involving the left hepatic lobe, measuring 9.3 by 7.1 cm on image 64
series 2, formerly about 6.7 by 5.8 cm by my measurements. Increased
associated biliary dilatation in the lateral segment left hepatic
lobe. Rim enhancing 2.6 by 2.4 cm lesion in the dome of the liver on
image 59 series 2, formerly 1.6 by 1.3 cm, this straddles between
the right and left hepatic lobe. There has been some interval
central necrosis of the inferior right hepatic lobe lesion which
measures 3.1 by 2.8 cm on image 87 series 2, formerly with more
diffuse enhancement and formerly measuring 3.0 by 2.3 cm.

The gallbladder appears unremarkable. No extrahepatic biliary
dilatation.

Pancreas: No pancreatic mass identified.

Spleen: Upper normal splenic size.

Adrenals/Urinary Tract: Both adrenal glands appear normal. No
significant renal abnormality.

Stomach/Bowel: Air-levels in the distal colon indicating diarrheal
process. Suspected sigmoid colon diverticulosis without compelling
findings of active diverticulitis. No substantially dilated small
bowel. Appendix within normal limits.

Vascular/Lymphatic: Aortoiliac atherosclerotic vascular disease.
Worsening adenopathy in the peripancreatic region and porta hepatis,
within upper peripancreatic node measuring 2.2 cm in short axis on
image 66 series 2 (formerly 0.9 cm), and a portacaval lymph node
measuring 1.4 cm in short axis on image 72 series 2 (formerly
cm).

Reproductive: Mild prostatomegaly. Speckled linear calcifications
along the spermatic cord.

Other: No supplemental non-categorized findings.

Musculoskeletal: Partially fused right sacroiliac joint. Notable
Schmorl's nodes at the L4-5 intervertebral space with associated
endplate sclerosis.
IMPRESSION: 1. Increase in size of hepatic metastatic lesions, with new
peripancreatic and porta hepatis adenopathy compatible with
progressive malignancy.
2. A 4 by 3 mm left upper lobe nodule previously measured 2 mm in
diameter, and could represent an early metastatic lesion.
3. Mildly increased volume of loculated right pleural effusion,
although overall the pleural effusion is still small.
4. Air fluid levels in the distal colon indicating diarrheal
process.
5. Similar appearance of the bandlike consolidation medially in the
right upper lobe and superior segment right lower lobe compared to
previous. Other imaging findings of potential clinical significance:
Aortic Atherosclerosis ([Q1]-[Q1]). Coronary atherosclerosis.
Proximal airway plugging along the right middle lobe bronchus. Mild
atypical infectious bronchiolitis in the left lower lobe. Mild
prostatomegaly. Degenerative disc disease at L4-5.

## 2021-01-12 MED ORDER — IOHEXOL 350 MG/ML SOLN
80.0000 mL | Freq: Once | INTRAVENOUS | Status: AC | PRN
  Administered 2021-01-12: 80 mL via INTRAVENOUS

## 2021-01-12 MED ORDER — OXYCODONE HCL 5 MG PO TABS: 5.0000 mg | ORAL_TABLET | Freq: Three times a day (TID) | ORAL | 0 refills | Status: AC | PRN

## 2021-01-12 NOTE — Telephone Encounter (Signed)
MS Contin refill requested . Message sent to [facility].

## 2021-01-14 ENCOUNTER — Other Ambulatory Visit: Payer: Self-pay | Admitting: Internal Medicine

## 2021-01-14 ENCOUNTER — Telehealth: Payer: Self-pay | Admitting: Medical Oncology

## 2021-01-14 MED ORDER — MORPHINE SULFATE ER 30 MG PO TBCR: 30.0000 mg | EXTENDED_RELEASE_TABLET | Freq: Two times a day (BID) | ORAL | 0 refills | Status: DC

## 2021-01-14 NOTE — Telephone Encounter (Signed)
I told wife the MScontin will be refilled today.

## 2021-01-15 ENCOUNTER — Other Ambulatory Visit: Payer: Self-pay

## 2021-01-15 ENCOUNTER — Inpatient Hospital Stay (HOSPITAL_BASED_OUTPATIENT_CLINIC_OR_DEPARTMENT_OTHER): Payer: Medicare Other | Admitting: Internal Medicine

## 2021-01-15 ENCOUNTER — Encounter: Payer: Self-pay | Admitting: Internal Medicine

## 2021-01-15 VITALS — BP 136/82 | HR 58 | Temp 97.5°F | Resp 19 | Ht 74.0 in | Wt 141.7 lb

## 2021-01-15 DIAGNOSIS — Z5111 Encounter for antineoplastic chemotherapy: Secondary | ICD-10-CM

## 2021-01-15 DIAGNOSIS — C3491 Malignant neoplasm of unspecified part of right bronchus or lung: Secondary | ICD-10-CM

## 2021-01-15 DIAGNOSIS — Z5112 Encounter for antineoplastic immunotherapy: Secondary | ICD-10-CM

## 2021-01-15 MED ORDER — FOLIC ACID 1 MG PO TABS: 1.0000 mg | ORAL_TABLET | Freq: Every day | ORAL | 4 refills | Status: DC

## 2021-01-15 MED ORDER — CYANOCOBALAMIN 1000 MCG/ML IJ SOLN
1000.0000 ug | Freq: Once | INTRAMUSCULAR | Status: AC
  Administered 2021-01-15: 1000 ug via INTRAMUSCULAR

## 2021-01-15 MED ORDER — PROCHLORPERAZINE MALEATE 10 MG PO TABS: 10.0000 mg | ORAL_TABLET | Freq: Four times a day (QID) | ORAL | 0 refills | Status: DC | PRN

## 2021-01-15 NOTE — Progress Notes (Signed)
DISCONTINUE OFF PATHWAY REGIMEN - Non-Small Cell Lung   [id number]:Sotorasib 960 mg PO Daily D1-28 q28 Days:   A cycle is every 28 days:     Sotorasib   **Always confirm dose/schedule in your pharmacy ordering system**  REASON: Disease Progression PRIOR TREATMENT: Off Pathway: Sotorasib 960 mg PO Daily D1-28 q28 Days TREATMENT RESPONSE: Progressive Disease (PD)  START ON PATHWAY REGIMEN - Non-Small Cell Lung     A cycle is every 21 days:     Pembrolizumab      Pemetrexed      Carboplatin   **Always confirm dose/schedule in your pharmacy ordering system**  Patient Characteristics: Stage IV Metastatic, Nonsquamous, Molecular Analysis Completed, Molecular Alteration Present and Targeted Therapy Exhausted OR EGFR Exon 20+ or KRAS G12C+ Present and No Prior Chemo/Immunotherapy OR No Alteration Present, Initial  Chemotherapy/Immunotherapy, PS = 0, 1, BRAF/MET/KRAS Mutation Positive, Candidate for Immunotherapy, PD-L1 Expression Positive  ? 50% (TPS) and Immunotherapy Candidate Therapeutic Status: Stage IV Metastatic Histology: Nonsquamous Cell Broad Molecular Profiling Status: Engineer, manufacturing Analysis Results: KRAS G12C Mutation Present and No Prior Chemo/Immunotherapy ECOG Performance Status: 1 Chemotherapy/Immunotherapy Line of Therapy: Initial Chemotherapy/Immunotherapy Immunotherapy Candidate Status: Candidate for Immunotherapy PD-L1 Expression Status: PD-L1 Positive ? 50% (TPS) Intent of Therapy: Non-Curative / Palliative Intent, Discussed with Patient

## 2021-01-15 NOTE — Progress Notes (Signed)
[place] Telephone:(336) [phone]   Fax:(336) [phone]  OFFICE PROGRESS NOTE  Koirala, Dibas, MD [address] [place] [address]  DIAGNOSIS:  Metastatic non-small cell lung cancer initially diagnosed as stage IIIA/B (T3, N1/N2, M0) non-small cell lung cancer, adenocarcinoma.  He presented with a right upper lobe lung mass with direct extension across the major fissure into the superior segment of the right lower lobe and ipsilateral hilar lymphadenopathy.  There is questionable subcarinal lymphadenopathy versus esophageal hypermetabolism this was evaluated by gastroenterology with upper endoscopy on 01/30/2020 and there was no mass lesion or malignancy in that area. The patient was diagnosed in May 2021.    Biomarker Findings Microsatellite status - MS-Stable Tumor Mutational Burden - 9 Muts/Mb Genomic Findings For a complete list of the genes assayed, please refer to the Appendix. KRAS G12C CDKN2A/B [id number] S12* NKX2-1 amplification - equivocal? PMS2 R3Q TP53 P73fs*38 7 Disease relevant genes with no reportable alterations: ALK, BRAF, EGFR, ERBB2, MET, RET, ROS1   PDL1: 70%  REPEAT MOLECULAR STUDIES BY CARIS ON THE LIVER BIOPSY:  Results with Therapy Associations BIOMARKER METHOD ANALYTE RESULT THERAPY ASSOCIATION BIOMARKER LEVEL* . alectinib, ceritinib, crizotinib, lorlatinib Level 1 . IHC Protein Negative  0 brigatinib Level 2 . ALK Seq RNA-Tumor Fusion Not Detected LACK OF BENEFIT alectinib, brigatinib, ceritinib, crizotinib, lorlatinib Level 2 .BRAF Seq DNA-Tumor Mutation Not Detected LACK OF BENEFIT dabrafenib and trametinib combination therapy, vemurafenib Level 2 .EGFR Seq DNA-Tumor Mutation Not Detected LACK OF BENEFIT erlotinib, gefitinib Level 2 .KRAS Seq DNA-Tumor Mutation Not Detected LACK OF BENEFIT sotorasib Level 2 .RET Seq RNA-Tumor Fusion Not Detected LACK OF BENEFIT pralsetinib, selpercatinib Level  2 .ROS1 Seq RNA-Tumor Fusion Not Detected LACK OF BENEFIT ceritinib, crizotinib, entrectinib, lorlatinib Level 2 . CNA-Seq DNA-Tumor Amplification Not Detected .MET Seq RNA-Tumor Variant Transcript Not Detected LACK OFBENEFIT crizotinib Level 3  PRIOR THERAPY:  1) Weekly concurrent chemoradiation with carboplatin for an AUC of 2 and paclitaxel 45 mg/m.  First dose expected on 11/26/2019. Status post 7 cycles.   Last dose was given 01/07/2020 with partial response. 2) Consolidation treatment with immunotherapy with Imfinzi 1500 mg IV every 4 weeks.  First dose 02/19/2020. Status post 6 cycles.  Discontinued secondary to disease progression. 3) Lumakras (Sotorasib) 960 mg p.o. daily.  First dose started December 12, 2020.  Status post 1 months of treatment discontinued secondary to disease progression in the liver    CURRENT THERAPY: Systemic chemotherapy with carboplatin for AUC of 5, Alimta 500 Mg/M2 and Keytruda 200 Mg IV every 3 weeks.  First dose 01/22/2021.  INTERVAL HISTORY: Christopher Harding 70 y.o. male returns to the clinic today for follow-up visit accompanied by his wife.  The patient continues to complain of increasing fatigue and weakness as well as pain on the right upper quadrant of the abdomen.  He is currently on MS Contin and oxycodone for breakthrough pain.  He denied having any chest pain, shortness of breath, cough or hemoptysis.  He continues to have lack of appetite and weight loss.  He has no nausea, vomiting, diarrhea or constipation.  He has no headache or visual changes.  He tolerated the first months of his treatment with Lumakras (Sotorasib) fairly well except for the fatigue and occasional diarrhea.  The patient had repeat CT scan of the chest, abdomen pelvis performed recently and he is here for evaluation and discussion of his scan results and treatment options.   MEDICAL HISTORY:  Past Medical History:  Diagnosis Date   A-fib [facility])    s/p DCCV 12/07/18   Cancer [facility])     Depression    situational   Dysrhythmia    Hypertension     ALLERGIES:  has No Known Allergies.  MEDICATIONS:  Current Outpatient Medications  Medication Sig Dispense Refill   methylPREDNISolone (MEDROL DOSEPAK) 4 MG TBPK tablet Use as instructed 21 tablet 0   metoprolol tartrate (LOPRESSOR) 50 MG tablet Take 50 mg by mouth 2 (two) times daily with a meal.     mirtazapine (REMERON) 30 MG tablet TAKE 1 TABLET BY MOUTH AT BEDTIME. 90 tablet 1   morphine (MS CONTIN) 30 MG 12 hr tablet Take 1 tablet (30 mg total) by mouth every 12 (twelve) hours. 60 tablet 0   naproxen sodium (ALEVE) 220 MG tablet Take 220 mg by mouth daily as needed (pain).     oxyCODONE (OXY IR/ROXICODONE) 5 MG immediate release tablet Take 1 tablet (5 mg total) by mouth 3 (three) times daily as needed for severe pain. 30 tablet 0   predniSONE (DELTASONE) 20 MG tablet 4 tablet p.o. daily for 1 week followed by 3 tablet p.o. daily for 1 week followed by 2 tablet p.o. daily for 1 week followed by 1 tablet p.o. daily for 1 week followed by 0.5 tablet p.o. daily for 1 week 74 tablet 0   sotorasib (LUMAKRAS) 120 MG TABS Take 8 tablets (960 mg) by mouth daily. 240 tablet 2   XARELTO 20 MG TABS tablet Take 20 mg by mouth daily.     No current facility-administered medications for this visit.    SURGICAL HISTORY:  Past Surgical History:  Procedure Laterality Date   CARDIOVERSION N/A 12/07/2018   Procedure: CARDIOVERSION;  Surgeon: Josue Hector, MD;  Location: [place];  Service: Cardiovascular;  Laterality: N/A;   FINE NEEDLE ASPIRATION  10/30/2019   Procedure: FINE NEEDLE ASPIRATION (FNA) LINEAR;  Surgeon: Candee Furbish, MD;  Location: [facility] ENDOSCOPY;  Service: Pulmonary;;   INGUINAL HERNIA REPAIR Right 01/25/2019   Procedure: RIGHT INGUINAL HERNIA REPAIR WITH MESH;  Surgeon: Coralie Keens, MD;  Location: [place];  Service: General;  Laterality: Right;  TAP BLOCK   INSERTION OF MESH Right 01/25/2019   Procedure:  Insertion Of Mesh;  Surgeon: Coralie Keens, MD;  Location: [place];  Service: General;  Laterality: Right;   VIDEO BRONCHOSCOPY WITH ENDOBRONCHIAL ULTRASOUND N/A 10/30/2019   Procedure: VIDEO BRONCHOSCOPY WITH ENDOBRONCHIAL ULTRASOUND;  Surgeon: Candee Furbish, MD;  Location: [facility] END[facility]COPY;  Service: Pulmonary;  Laterality: N/A;    REVIEW OF SYSTEMS:  Constitutional: positive for anorexia, fatigue, and weight loss Eyes: negative Ears, nose, mouth, throat, and face: negative Respiratory: negative Cardiovascular: negative Gastrointestinal: positive for abdominal pain, constipation, diarrhea, and dysphagia Genitourinary:negative Integument/breast: negative Hematologic/lymphatic: negative Musculoskeletal:positive for arthralgias and muscle weakness Neurological: positive for weakness Behavioral/Psych: negative Endocrine: negative Allergic/Immunologic: negative   PHYSICAL EXAMINATION: General appearance: alert, cooperative, fatigued, and no distress Head: Normocephalic, without obvious abnormality, atraumatic Neck: no adenopathy, no JVD, supple, symmetrical, trachea midline, and thyroid not enlarged, symmetric, no tenderness/mass/nodules Lymph nodes: Cervical, supraclavicular, and axillary nodes normal. Resp: clear to auscultation bilaterally Back: symmetric, no curvature. ROM normal. No CVA tenderness. Cardio: regular rate and rhythm, S1, S2 normal, no murmur, click, rub or gallop GI: Tender to palpation in the epigastric area Extremities: extremities normal, atraumatic, no cyanosis or edema Neurologic: Alert and oriented X 3, normal strength and tone. Normal symmetric reflexes. Normal  coordination and gait  ECOG PERFORMANCE STATUS: 1 - Symptomatic but completely ambulatory  Blood pressure 136/82, pulse (!) 58, temperature (!) 97.5 F (36.4 C), temperature source Tympanic, resp. rate 19, height $RemoveBe'6\' 2"'[id number]$  (1.88 m), weight 141 lb 11.2 oz (64.3 kg), SpO2 100 %.  LABORATORY DATA: Lab  Results  Component Value Date   WBC 6.8 01/12/2021   HGB 13.1 01/12/2021   HCT 37.5 (L) 01/12/2021   MCV 92.6 01/12/2021   PLT 226 01/12/2021      Chemistry      Component Value Date/Time   NA 131 (L) 01/12/2021 0918   NA 129 (L) 11/24/2018 1520   K 3.9 01/12/2021 0918   CL 95 (L) 01/12/2021 0918   CO2 22 01/12/2021 0918   BUN 5 (L) 01/12/2021 0918   BUN 8 11/24/2018 1520   CREATININE 0.66 01/12/2021 0918      Component Value Date/Time   CALCIUM 9.5 01/12/2021 0918   ALKPHOS 253 (H) 01/12/2021 0918   AST 50 (H) 01/12/2021 0918   ALT 26 01/12/2021 0918   BILITOT 1.1 01/12/2021 0918       RADIOGRAPHIC STUDIES: CT Chest W Contrast  Result Date: 01/12/2021 CLINICAL DATA:  Restaging non-small cell lung cancer. Immunotherapy. EXAM: CT CHEST, ABDOMEN, AND PELVIS WITH CONTRAST TECHNIQUE: Multidetector CT imaging of the chest, abdomen and pelvis was performed following the standard protocol during bolus administration of intravenous contrast. CONTRAST:  40mL OMNIPAQUE IOHEXOL 350 MG/ML SOLN COMPARISON:  11/06/2020 FINDINGS: CT CHEST FINDINGS Cardiovascular: Trace amount of gas along the anterior mediastinum is thought to probably be injection related venous gas based on distribution. Accordingly this is not thought to represent pneumomediastinum. Coronary, aortic arch, and branch vessel atherosclerotic vascular disease. Mediastinum/Nodes: No pathologic adenopathy identified. Lungs/Pleura: Mild increase in the small right pleural effusion which may have loculated components tracking along the posterior mid lung and apex. Similar appearance of bandlike consolidation particularly medially in the right upper lobe and in the superior segment right lower lobe. Proximal airway plugging along the right middle lobe bronchus. Densely calcified right basilar nodule, image 139 series 8, stable. Stable 3 mm right-sided nodule on image 124 series 8. Bilateral airway thickening. The 4 by 3 mm left upper  lobe nodule on image 56 series 8 previously measured 0.2 cm on 11/06/2020. Tree-in-bud reticulonodular opacity in the left lower lobe on image 132 series 8 similar to prior and probably from atypical infectious bronchiolitis given the pattern. Musculoskeletal: Old right rib deformities from healed fractures. CT ABDOMEN PELVIS FINDINGS Hepatobiliary: Further enlargement of the dominant metastatic lesion involving the left hepatic lobe, measuring 9.3 by 7.1 cm on image 64 series 2, formerly about 6.7 by 5.8 cm by my measurements. Increased associated biliary dilatation in the lateral segment left hepatic lobe. Rim enhancing 2.6 by 2.4 cm lesion in the dome of the liver on image 59 series 2, formerly 1.6 by 1.3 cm, this straddles between the right and left hepatic lobe. There has been some interval central necrosis of the inferior right hepatic lobe lesion which measures 3.1 by 2.8 cm on image 87 series 2, formerly with more diffuse enhancement and formerly measuring 3.0 by 2.3 cm. The gallbladder appears unremarkable. No extrahepatic biliary dilatation. Pancreas: No pancreatic mass identified. Spleen: Upper normal splenic size. Adrenals/Urinary Tract: Both adrenal glands appear normal. No significant renal abnormality. Stomach/Bowel: Air-levels in the distal colon indicating diarrheal process. Suspected sigmoid colon diverticulosis without compelling findings of active diverticulitis. No substantially dilated small bowel. Appendix  within normal limits. Vascular/Lymphatic: Aortoiliac atherosclerotic vascular disease. Worsening adenopathy in the peripancreatic region and porta hepatis, within upper peripancreatic node measuring 2.2 cm in short axis on image 66 series 2 (formerly 0.9 cm), and a portacaval lymph node measuring 1.4 cm in short axis on image 72 series 2 (formerly 0.5 cm). Reproductive: Mild prostatomegaly. Speckled linear calcifications along the spermatic cord. Other: No supplemental non-categorized  findings. Musculoskeletal: Partially fused right sacroiliac joint. Notable Schmorl's nodes at the L4-5 intervertebral space with associated endplate sclerosis. IMPRESSION: 1. Increase in size of hepatic metastatic lesions, with new peripancreatic and porta hepatis adenopathy compatible with progressive malignancy. 2. A 4 by 3 mm left upper lobe nodule previously measured 2 mm in diameter, and could represent an early metastatic lesion. 3. Mildly increased volume of loculated right pleural effusion, although overall the pleural effusion is still small. 4. Air fluid levels in the distal colon indicating diarrheal process. 5. Similar appearance of the bandlike consolidation medially in the right upper lobe and superior segment right lower lobe compared to previous. Other imaging findings of potential clinical significance: Aortic Atherosclerosis (ICD10-I70.0). Coronary atherosclerosis. Proximal airway plugging along the right middle lobe bronchus. Mild atypical infectious bronchiolitis in the left lower lobe. Mild prostatomegaly. Degenerative disc disease at L4-5. Electronically Signed   By: Van Clines M.D.   On: 01/12/2021 11:56   CT Abdomen Pelvis W Contrast  Result Date: 01/12/2021 CLINICAL DATA:  Restaging non-small cell lung cancer. Immunotherapy. EXAM: CT CHEST, ABDOMEN, AND PELVIS WITH CONTRAST TECHNIQUE: Multidetector CT imaging of the chest, abdomen and pelvis was performed following the standard protocol during bolus administration of intravenous contrast. CONTRAST:  44mL OMNIPAQUE IOHEXOL 350 MG/ML SOLN COMPARISON:  11/06/2020 FINDINGS: CT CHEST FINDINGS Cardiovascular: Trace amount of gas along the anterior mediastinum is thought to probably be injection related venous gas based on distribution. Accordingly this is not thought to represent pneumomediastinum. Coronary, aortic arch, and branch vessel atherosclerotic vascular disease. Mediastinum/Nodes: No pathologic adenopathy identified.  Lungs/Pleura: Mild increase in the small right pleural effusion which may have loculated components tracking along the posterior mid lung and apex. Similar appearance of bandlike consolidation particularly medially in the right upper lobe and in the superior segment right lower lobe. Proximal airway plugging along the right middle lobe bronchus. Densely calcified right basilar nodule, image 139 series 8, stable. Stable 3 mm right-sided nodule on image 124 series 8. Bilateral airway thickening. The 4 by 3 mm left upper lobe nodule on image 56 series 8 previously measured 0.2 cm on 11/06/2020. Tree-in-bud reticulonodular opacity in the left lower lobe on image 132 series 8 similar to prior and probably from atypical infectious bronchiolitis given the pattern. Musculoskeletal: Old right rib deformities from healed fractures. CT ABDOMEN PELVIS FINDINGS Hepatobiliary: Further enlargement of the dominant metastatic lesion involving the left hepatic lobe, measuring 9.3 by 7.1 cm on image 64 series 2, formerly about 6.7 by 5.8 cm by my measurements. Increased associated biliary dilatation in the lateral segment left hepatic lobe. Rim enhancing 2.6 by 2.4 cm lesion in the dome of the liver on image 59 series 2, formerly 1.6 by 1.3 cm, this straddles between the right and left hepatic lobe. There has been some interval central necrosis of the inferior right hepatic lobe lesion which measures 3.1 by 2.8 cm on image 87 series 2, formerly with more diffuse enhancement and formerly measuring 3.0 by 2.3 cm. The gallbladder appears unremarkable. No extrahepatic biliary dilatation. Pancreas: No pancreatic mass identified. Spleen: Upper normal  splenic size. Adrenals/Urinary Tract: Both adrenal glands appear normal. No significant renal abnormality. Stomach/Bowel: Air-levels in the distal colon indicating diarrheal process. Suspected sigmoid colon diverticulosis without compelling findings of active diverticulitis. No substantially  dilated small bowel. Appendix within normal limits. Vascular/Lymphatic: Aortoiliac atherosclerotic vascular disease. Worsening adenopathy in the peripancreatic region and porta hepatis, within upper peripancreatic node measuring 2.2 cm in short axis on image 66 series 2 (formerly 0.9 cm), and a portacaval lymph node measuring 1.4 cm in short axis on image 72 series 2 (formerly 0.5 cm). Reproductive: Mild prostatomegaly. Speckled linear calcifications along the spermatic cord. Other: No supplemental non-categorized findings. Musculoskeletal: Partially fused right sacroiliac joint. Notable Schmorl's nodes at the L4-5 intervertebral space with associated endplate sclerosis. IMPRESSION: 1. Increase in size of hepatic metastatic lesions, with new peripancreatic and porta hepatis adenopathy compatible with progressive malignancy. 2. A 4 by 3 mm left upper lobe nodule previously measured 2 mm in diameter, and could represent an early metastatic lesion. 3. Mildly increased volume of loculated right pleural effusion, although overall the pleural effusion is still small. 4. Air fluid levels in the distal colon indicating diarrheal process. 5. Similar appearance of the bandlike consolidation medially in the right upper lobe and superior segment right lower lobe compared to previous. Other imaging findings of potential clinical significance: Aortic Atherosclerosis (ICD10-I70.0). Coronary atherosclerosis. Proximal airway plugging along the right middle lobe bronchus. Mild atypical infectious bronchiolitis in the left lower lobe. Mild prostatomegaly. Degenerative disc disease at L4-5. Electronically Signed   By: Van Clines M.D.   On: 01/12/2021 11:56     ASSESSMENT AND PLAN: This is a very pleasant 69 years old white male with metastatic non-small cell lung cancer initially diagnosed as stage IIIA/B (T3, N1/N2, M0) non-small cell lung cancer, adenocarcinoma presented with right upper lobe lung mass with direct  extension across the major fissure into the superior segment of the right lower lobe and ipsilateral hilar lymphadenopathy as well as questionable subcarinal lymphadenopathy.  The patient had evidence for disease metastasis confirmed in June 2022. The patient completed a course of concurrent chemoradiation with weekly carboplatin and paclitaxel status post 7 cycles.  He tolerated this treatment well with no concerning adverse effect except for mild sore throat and mouth. The patient had partial response to this treatment. The patient is currently undergoing a course of consolidation treatment with immunotherapy with Imfinzi 1500 mg IV every 4 weeks status post 6 cycles.  He has been tolerating his treatment well with no concerning adverse effects. His last CT scan of the chest several months ago showed no concerning findings for disease progression in the chest but the patient has a highly suspicious liver lesion concerning for metastatic disease.  The patient underwent ultrasound-guided core biopsy of this lesion. The final pathology read at [facility] in addition to [facility] showed no evidence of malignancy and there was suspicious inflammatory process of biliary origin. He has been on observation and repeat CT scan of the chest, abdomen pelvis performed few weeks ago showed further increase in the size of the suspicious liver metastasis.  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 He has no interest in systemic chemotherapy.  The patient is started treatment with Lumakras (Sotorasib) 960 mg p.o. daily 4 days ago and has been tolerating this treatment well. He continues to  have increasing fatigue and weakness as well as lack of appetite and weight loss.  He also has significant pain  in the epigastric area. 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 The patient started treatment with Lumakras (Sotorasib) 960 mg p.o. daily status post 1 months.  He tolerated the treatment well except for fatigue and occasional diarrhea. He had repeat CT scan of the chest, abdomen pelvis performed recently. I personally and independently reviewed the scan images and discussed the result and showed the images to the patient and his wife. Unfortunately his scan showed further disease progression in the liver with enlargement of the dominant mass and development of new lesions in the liver. I recommended for the patient to discontinue his current treatment with Lumakras (Sotorasib) at this point.  I discussed with the patient other options for management of his condition including the palliative care versus palliative systemic chemotherapy with carboplatin for AUC of 5, Alimta 500 Mg/M2 and Keytruda 200 Mg IV every 3 weeks. The patient and his wife are interested in the treatment. I discussed with him the adverse effect of this treatment including but not limited to mild alopecia, myelosuppression, nausea and vomiting, peripheral neuropathy, liver or renal dysfunction as well as immunotherapy adverse effects. I will also provide them with a handout in the after visit summary. He is expected to start the first cycle of this treatment next week.  The patient will receive vitamin B12 injection today and I will call his pharmacy with prescription for Compazine 10 mg p.o. every 6 hours as needed for nausea in addition to folic acid 1 mg p.o. daily. For the pain management he will  continue on MS Contin as well as oxycodone for breakthrough pain. The patient will come back for follow-up visit in 2 weeks for evaluation and management of any adverse effect of his treatment. He was advised to call immediately if he has any other concerning symptoms in the interval.  The patient voices understanding of current disease status and treatment options and is in agreement with the current care plan.  All questions were answered. The patient knows to call the clinic with any problems, questions or concerns. We can certainly see the patient much sooner if necessary.  Disclaimer: This note was dictated with voice recognition software. Similar sounding words can inadvertently be transcribed and may not be corrected upon review.

## 2021-01-16 ENCOUNTER — Telehealth: Payer: Self-pay | Admitting: Internal Medicine

## 2021-01-16 ENCOUNTER — Telehealth: Payer: Self-pay | Admitting: Medical Oncology

## 2021-01-16 NOTE — Telephone Encounter (Signed)
I told wife his antiemetic will be given IV.

## 2021-01-16 NOTE — Telephone Encounter (Signed)
Scheduled appts per 8/11 los. Pt's wife is aware.

## 2021-01-21 MED FILL — Dexamethasone Sodium Phosphate Inj 100 MG/10ML: INTRAMUSCULAR | Qty: 1 | Status: AC

## 2021-01-21 MED FILL — Fosaprepitant Dimeglumine For IV Infusion 150 MG (Base Eq): INTRAVENOUS | Qty: 5 | Status: AC

## 2021-01-21 NOTE — Progress Notes (Signed)
Pharmacist Chemotherapy Monitoring - Initial Assessment    Anticipated start date: 01/22/21   The following has been reviewed per standard work regarding the patient's treatment regimen: The patient's diagnosis, treatment plan and drug doses, and organ/hematologic function Lab orders and baseline tests specific to treatment regimen  The treatment plan start date, drug sequencing, and pre-medications Prior authorization status  Patient's documented medication list, including drug-drug interaction screen and prescriptions for anti-emetics and supportive care specific to the treatment regimen The drug concentrations, fluid compatibility, administration routes, and timing of the medications to be used The patient's access for treatment and lifetime cumulative dose history, if applicable  The patient's medication allergies and previous infusion related reactions, if applicable   Changes made to treatment plan:  Unless pt does not tolerate Carboplatin, no additional premeds are needed w/ the planned 4 cycles of Carboplatin based on prior exposure.  Follow up needed:  life time cumulative doses Confirm pt stopped taking Lumakras (I removed from med list)   Tora Kindred, RPH, 01/21/2021  3:00 PM

## 2021-01-22 ENCOUNTER — Inpatient Hospital Stay: Payer: Medicare Other

## 2021-01-22 ENCOUNTER — Other Ambulatory Visit: Payer: Self-pay

## 2021-01-22 VITALS — BP 101/85 | HR 64 | Temp 97.7°F | Resp 18 | Wt 137.8 lb

## 2021-01-22 DIAGNOSIS — C787 Secondary malignant neoplasm of liver and intrahepatic bile duct: Secondary | ICD-10-CM | POA: Diagnosis not present

## 2021-01-22 DIAGNOSIS — C3411 Malignant neoplasm of upper lobe, right bronchus or lung: Secondary | ICD-10-CM | POA: Diagnosis not present

## 2021-01-22 DIAGNOSIS — K5900 Constipation, unspecified: Secondary | ICD-10-CM | POA: Diagnosis not present

## 2021-01-22 DIAGNOSIS — F32A Depression, unspecified: Secondary | ICD-10-CM | POA: Diagnosis not present

## 2021-01-22 DIAGNOSIS — C3491 Malignant neoplasm of unspecified part of right bronchus or lung: Secondary | ICD-10-CM

## 2021-01-22 DIAGNOSIS — Z79899 Other long term (current) drug therapy: Secondary | ICD-10-CM | POA: Diagnosis not present

## 2021-01-22 DIAGNOSIS — Z5111 Encounter for antineoplastic chemotherapy: Secondary | ICD-10-CM | POA: Diagnosis not present

## 2021-01-22 DIAGNOSIS — C3431 Malignant neoplasm of lower lobe, right bronchus or lung: Secondary | ICD-10-CM | POA: Diagnosis not present

## 2021-01-22 DIAGNOSIS — Z7901 Long term (current) use of anticoagulants: Secondary | ICD-10-CM | POA: Diagnosis not present

## 2021-01-22 DIAGNOSIS — I10 Essential (primary) hypertension: Secondary | ICD-10-CM | POA: Diagnosis not present

## 2021-01-22 DIAGNOSIS — Z923 Personal history of irradiation: Secondary | ICD-10-CM | POA: Diagnosis not present

## 2021-01-22 DIAGNOSIS — I4891 Unspecified atrial fibrillation: Secondary | ICD-10-CM | POA: Diagnosis not present

## 2021-01-22 DIAGNOSIS — R630 Anorexia: Secondary | ICD-10-CM | POA: Diagnosis not present

## 2021-01-22 DIAGNOSIS — I2510 Atherosclerotic heart disease of native coronary artery without angina pectoris: Secondary | ICD-10-CM | POA: Diagnosis not present

## 2021-01-22 DIAGNOSIS — N400 Enlarged prostate without lower urinary tract symptoms: Secondary | ICD-10-CM | POA: Diagnosis not present

## 2021-01-22 DIAGNOSIS — R1310 Dysphagia, unspecified: Secondary | ICD-10-CM | POA: Diagnosis not present

## 2021-01-22 DIAGNOSIS — R590 Localized enlarged lymph nodes: Secondary | ICD-10-CM | POA: Diagnosis not present

## 2021-01-22 LAB — CBC WITH DIFFERENTIAL (CANCER CENTER ONLY)
Abs Immature Granulocytes: 0.04 10*3/uL (ref 0.00–0.07)
Basophils Absolute: 0 10*3/uL (ref 0.0–0.1)
Basophils Relative: 0 %
Eosinophils Absolute: 0 10*3/uL (ref 0.0–0.5)
Eosinophils Relative: 0 %
HCT: 38.2 % — ABNORMAL LOW (ref 39.0–52.0)
Hemoglobin: 13 g/dL (ref 13.0–17.0)
Immature Granulocytes: 0 %
Lymphocytes Relative: 5 %
Lymphs Abs: 0.5 10*3/uL — ABNORMAL LOW (ref 0.7–4.0)
MCH: 31.9 pg (ref 26.0–34.0)
MCHC: 34 g/dL (ref 30.0–36.0)
MCV: 93.9 fL (ref 80.0–100.0)
Monocytes Absolute: 1.1 10*3/uL — ABNORMAL HIGH (ref 0.1–1.0)
Monocytes Relative: 11 %
Neutro Abs: 8.2 10*3/uL — ABNORMAL HIGH (ref 1.7–7.7)
Neutrophils Relative %: 84 %
Platelet Count: 269 10*3/uL (ref 150–400)
RBC: 4.07 MIL/uL — ABNORMAL LOW (ref 4.22–5.81)
RDW: 13.7 % (ref 11.5–15.5)
WBC Count: 9.9 10*3/uL (ref 4.0–10.5)
nRBC: 0 % (ref 0.0–0.2)

## 2021-01-22 LAB — CMP (CANCER CENTER ONLY)
ALT: 15 U/L (ref 0–44)
AST: 26 U/L (ref 15–41)
Albumin: 3.1 g/dL — ABNORMAL LOW (ref 3.5–5.0)
Alkaline Phosphatase: 216 U/L — ABNORMAL HIGH (ref 38–126)
Anion gap: 11 (ref 5–15)
BUN: 6 mg/dL — ABNORMAL LOW (ref 8–23)
CO2: 23 mmol/L (ref 22–32)
Calcium: 9.1 mg/dL (ref 8.9–10.3)
Chloride: 95 mmol/L — ABNORMAL LOW (ref 98–111)
Creatinine: 0.58 mg/dL — ABNORMAL LOW (ref 0.61–1.24)
GFR, Estimated: >60 mL/min (ref >60)
Glucose, Bld: 86 mg/dL (ref 70–99)
Potassium: 4 mmol/L (ref 3.5–5.1)
Sodium: 129 mmol/L — ABNORMAL LOW (ref 135–145)
Total Bilirubin: 0.8 mg/dL (ref 0.3–1.2)
Total Protein: 7 g/dL (ref 6.5–8.1)

## 2021-01-22 LAB — TSH: TSH: 1.554 u[IU]/mL (ref 0.320–4.118)

## 2021-01-22 MED ORDER — DEXAMETHASONE SODIUM PHOSPHATE 100 MG/10ML IJ SOLN
10.0000 mg | Freq: Once | INTRAMUSCULAR | Status: AC
  Administered 2021-01-22: 10 mg via INTRAVENOUS
  Filled 2021-01-22: qty 10

## 2021-01-22 MED ORDER — SODIUM CHLORIDE 0.9 % IV SOLN
437.5000 mg | Freq: Once | INTRAVENOUS | Status: AC
  Administered 2021-01-22: 440 mg via INTRAVENOUS
  Filled 2021-01-22: qty 44

## 2021-01-22 MED ORDER — SODIUM CHLORIDE 0.9 % IV SOLN
200.0000 mg | Freq: Once | INTRAVENOUS | Status: AC
  Administered 2021-01-22: 200 mg via INTRAVENOUS
  Filled 2021-01-22: qty 8

## 2021-01-22 MED ORDER — SODIUM CHLORIDE 0.9 % IV SOLN
500.0000 mg/m2 | Freq: Once | INTRAVENOUS | Status: AC
  Administered 2021-01-22: 900 mg via INTRAVENOUS
  Filled 2021-01-22: qty 20

## 2021-01-22 MED ORDER — SODIUM CHLORIDE 0.9 % IV SOLN: Freq: Once | INTRAVENOUS | Status: AC

## 2021-01-22 MED ORDER — SODIUM CHLORIDE 0.9 % IV SOLN
150.0000 mg | Freq: Once | INTRAVENOUS | Status: AC
  Administered 2021-01-22: 150 mg via INTRAVENOUS
  Filled 2021-01-22: qty 150

## 2021-01-22 MED ORDER — PALONOSETRON HCL INJECTION 0.25 MG/5ML
0.2500 mg | Freq: Once | INTRAVENOUS | Status: AC
  Administered 2021-01-22: 0.25 mg via INTRAVENOUS
  Filled 2021-01-22: qty 5

## 2021-01-22 NOTE — Patient Instructions (Signed)
[facility]  Discharge Instructions: Thank you for choosing [facility] to provide your [facility] and hematology care.   If you have a lab appointment with the [facility], please go directly to the [facility] and check in at the registration area.   Wear comfortable clothing and clothing appropriate for easy access to any Portacath or PICC line.   We strive to give you quality time with your provider. You may need to reschedule your appointment if you arrive late (15 or more minutes).  Arriving late affects you and other patients whose appointments are after yours.  Also, if you miss three or more appointments without notifying the office, you may be dismissed from the clinic at the provider's discretion.      For prescription refill requests, have your pharmacy contact our office and allow 72 hours for refills to be completed.    Today you received the following chemotherapy and/or immunotherapy agents Pembrolizumab (Keytruda), Pemetrexed (Alimta), and Carboplatin.      To help prevent nausea and vomiting after your treatment, we encourage you to take your nausea medication as directed.  BELOW ARE SYMPTOMS THAT SHOULD BE REPORTED IMMEDIATELY: *FEVER GREATER THAN 100.4 F (38 C) OR HIGHER *CHILLS OR SWEATING *NAUSEA AND VOMITING THAT IS NOT CONTROLLED WITH YOUR NAUSEA MEDICATION *UNUSUAL SHORTNESS OF BREATH *UNUSUAL BRUISING OR BLEEDING *URINARY PROBLEMS (pain or burning when urinating, or frequent urination) *BOWEL PROBLEMS (unusual diarrhea, constipation, pain near the anus) TENDERNESS IN MOUTH AND THROAT WITH OR WITHOUT PRESENCE OF ULCERS (sore throat, sores in mouth, or a toothache) UNUSUAL RASH, SWELLING OR PAIN  UNUSUAL VAGINAL DISCHARGE OR ITCHING   Items with * indicate a potential emergency and should be followed up as soon as possible or go to the Emergency Department if any problems should occur.  Please show the CHEMOTHERAPY  ALERT CARD or IMMUNOTHERAPY ALERT CARD at check-in to the Emergency Department and triage nurse.  Should you have questions after your visit or need to cancel or reschedule your appointment, please contact [facility]  Dept: [phone]  and follow the prompts.  Office hours are 8:00 a.m. to 4:30 p.m. Monday - Friday. Please note that voicemails left after 4:00 p.m. may not be returned until the following business day.  We are closed weekends and major holidays. You have access to a nurse at all times for urgent questions. Please call the main number to the clinic Dept: [phone] and follow the prompts.   For any non-urgent questions, you may also contact your provider using MyChart. We now offer e-Visits for anyone 49 and older to request care online for non-urgent symptoms. For details visit [email or web address].   Also download the MyChart app! Go to the app store, search "MyChart", open the app, select [facility], and log in with your MyChart username and password.  Due to Covid, a mask is required upon entering the hospital/clinic. If you do not have a mask, one will be given to you upon arrival. For doctor visits, patients may have 1 support person aged 5 or older with them. For treatment visits, patients cannot have anyone with them due to current Covid guidelines and our immunocompromised population.    Pembrolizumab injection What is this medication? PEMBROLIZUMAB (pem broe liz ue mab) is a monoclonal antibody. It is used totreat certain types of cancer. This medicine may be used for other purposes; ask your health care provider orpharmacist if you have questions. COMMON  BRAND NAME(S): Keytruda What should I tell my care team before I take this medication? They need to know if you have any of these conditions: autoimmune diseases like Crohn's disease, ulcerative colitis, or lupus have had or planning to have an allogeneic stem cell transplant (uses  someone else's stem cells) history of organ transplant history of chest radiation nervous system problems like myasthenia gravis or Guillain-Barre syndrome an unusual or allergic reaction to pembrolizumab, other medicines, foods, dyes, or preservatives pregnant or trying to get pregnant breast-feeding How should I use this medication? This medicine is for infusion into a vein. It is given by a health careprofessional in a hospital or clinic setting. A special MedGuide will be given to you before each treatment. Be sure to readthis information carefully each time. Talk to your pediatrician regarding the use of this medicine in children. While this drug may be prescribed for children as young as 6 months for selectedconditions, precautions do apply. Overdosage: If you think you have taken too much of this medicine contact apoison control center or emergency room at once. NOTE: This medicine is only for you. Do not share this medicine with others. What if I miss a dose? It is important not to miss your dose. Call your doctor or health careprofessional if you are unable to keep an appointment. What may interact with this medication? Interactions have not been studied. This list may not describe all possible interactions. Give your health care provider a list of all the medicines, herbs, non-prescription drugs, or dietary supplements you use. Also tell them if you smoke, drink alcohol, or use illegaldrugs. Some items may interact with your medicine. What should I watch for while using this medication? Your condition will be monitored carefully while you are receiving thismedicine. You may need blood work done while you are taking this medicine. Do not become pregnant while taking this medicine or for 4 months after stopping it. Women should inform their doctor if they wish to become pregnant or think they might be pregnant. There is a potential for serious side effects to an unborn child. Talk to  your health care professional or pharmacist for more information. Do not breast-feed an infant while taking this medicine orfor 4 months after the last dose. What side effects may I notice from receiving this medication? Side effects that you should report to your doctor or health care professionalas soon as possible: allergic reactions like skin rash, itching or hives, swelling of the face, lips, or tongue bloody or black, tarry breathing problems changes in vision chest pain chills confusion constipation cough diarrhea dizziness or feeling faint or lightheaded fast or irregular heartbeat fever flushing joint pain low blood counts - this medicine may decrease the number of white blood cells, red blood cells and platelets. You may be at increased risk for infections and bleeding. muscle pain muscle weakness pain, tingling, numbness in the hands or feet persistent headache redness, blistering, peeling or loosening of the skin, including inside the mouth signs and symptoms of high blood sugar such as dizziness; dry mouth; dry skin; fruity breath; nausea; stomach pain; increased hunger or thirst; increased urination signs and symptoms of kidney injury like trouble passing urine or change in the amount of urine signs and symptoms of liver injury like dark urine, light-colored stools, loss of appetite, nausea, right upper belly pain, yellowing of the eyes or skin sweating swollen lymph nodes weight loss Side effects that usually do not require medical attention (report to yourdoctor or  health care professional if they continue or are bothersome): decreased appetite hair loss tiredness This list may not describe all possible side effects. Call your doctor for medical advice about side effects. You may report side effects to FDA at1-800-FDA-1088. Where should I keep my medication? This drug is given in a hospital or clinic and will not be stored at home. NOTE: This sheet is a summary.  It may not cover all possible information. If you have questions about this medicine, talk to your doctor, pharmacist, orhealth care provider.  2022 Elsevier/Gold Standard (2019-04-25 21:44:53)  Pemetrexed injection What is this medication? PEMETREXED (PEM e TREX ed) is a chemotherapy drug used to treat lung cancers like non-small cell lung cancer and mesothelioma. It may also be used to treatother cancers. This medicine may be used for other purposes; ask your health care provider orpharmacist if you have questions. COMMON BRAND NAME(S): Alimta What should I tell my care team before I take this medication? They need to know if you have any of these conditions: infection (especially a virus infection such as chickenpox, cold sores, or herpes) kidney disease low blood counts, like low white cell, platelet, or red cell counts lung or breathing disease, like asthma radiation therapy an unusual or allergic reaction to pemetrexed, other medicines, foods, dyes, or preservative pregnant or trying to get pregnant breast-feeding How should I use this medication? This drug is given as an infusion into a vein. It is administered in a hospitalor clinic by a specially trained health care professional. Talk to your pediatrician regarding the use of this medicine in children.Special care may be needed. Overdosage: If you think you have taken too much of this medicine contact apoison control center or emergency room at once. NOTE: This medicine is only for you. Do not share this medicine with others. What if I miss a dose? It is important not to miss your dose. Call your doctor or health careprofessional if you are unable to keep an appointment. What may interact with this medication? This medicine may interact with the following medications: Ibuprofen This list may not describe all possible interactions. Give your health care provider a list of all the medicines, herbs, non-prescription drugs, or  dietary supplements you use. Also tell them if you smoke, drink alcohol, or use illegaldrugs. Some items may interact with your medicine. What should I watch for while using this medication? Visit your doctor for checks on your progress. This drug may make you feel generally unwell. This is not uncommon, as chemotherapy can affect healthy cells as well as cancer cells. Report any side effects. Continue your course oftreatment even though you feel ill unless your doctor tells you to stop. In some cases, you may be given additional medicines to help with side effects.Follow all directions for their use. Call your doctor or health care professional for advice if you get a fever, chills or sore throat, or other symptoms of a cold or flu. Do not treat yourself. This drug decreases your body's ability to fight infections. Try toavoid being around people who are sick. This medicine may increase your risk to bruise or bleed. Call your doctor orhealth care professional if you notice any unusual bleeding. Be careful brushing and flossing your teeth or using a toothpick because you may get an infection or bleed more easily. If you have any dental work done,tell your dentist you are receiving this medicine. Avoid taking products that contain aspirin, acetaminophen, ibuprofen, naproxen, or ketoprofen unless instructed by your  doctor. These medicines may hide afever. Call your doctor or health care professional if you get diarrhea or mouthsores. Do not treat yourself. To protect your kidneys, drink water or other fluids as directed while you aretaking this medicine. Do not become pregnant while taking this medicine or for 6 months after stopping it. Women should inform their doctor if they wish to become pregnant or think they might be pregnant. Men should not father a child while taking this medicine and for 3 months after stopping it. This may interfere with the ability to father a child. You should talk to your  doctor or health care professional if you are concerned about your fertility. There is a potential for serious side effects to an unborn child. Talk to your health care professional or pharmacist for more information. Do not breast-feed an infantwhile taking this medicine or for 1 week after stopping it. What side effects may I notice from receiving this medication? Side effects that you should report to your doctor or health care professionalas soon as possible: allergic reactions like skin rash, itching or hives, swelling of the face, lips, or tongue breathing problems redness, blistering, peeling or loosening of the skin, including inside the mouth signs and symptoms of bleeding such as bloody or black, tarry stools; red or dark-brown urine; spitting up blood or brown material that looks like coffee grounds; red spots on the skin; unusual bruising or bleeding from the eye, gums, or nose signs and symptoms of infection like fever or chills; cough; sore throat; pain or trouble passing urine signs and symptoms of kidney injury like trouble passing urine or change in the amount of urine signs and symptoms of liver injury like dark yellow or brown urine; general ill feeling or flu-like symptoms; light-colored stools; loss of appetite; nausea; right upper belly pain; unusually weak or tired; yellowing of the eyes or skin Side effects that usually do not require medical attention (report to yourdoctor or health care professional if they continue or are bothersome): constipation mouth sores nausea, vomiting unusually weak or tired This list may not describe all possible side effects. Call your doctor for medical advice about side effects. You may report side effects to FDA at1-800-FDA-1088. Where should I keep my medication? This drug is given in a hospital or clinic and will not be stored at home. NOTE: This sheet is a summary. It may not cover all possible information. If you have questions about this  medicine, talk to your doctor, pharmacist, orhealth care provider.  2022 Elsevier/Gold Standard (2017-07-13 16:11:33)  Carboplatin injection What is this medication? CARBOPLATIN (KAR boe pla tin) is a chemotherapy drug. It targets fast dividing cells, like cancer cells, and causes these cells to die. This medicine is usedto treat ovarian cancer and many other cancers. This medicine may be used for other purposes; ask your health care provider orpharmacist if you have questions. COMMON BRAND NAME(S): Paraplatin What should I tell my care team before I take this medication? They need to know if you have any of these conditions: blood disorders hearing problems kidney disease recent or ongoing radiation therapy an unusual or allergic reaction to carboplatin, cisplatin, other chemotherapy, other medicines, foods, dyes, or preservatives pregnant or trying to get pregnant breast-feeding How should I use this medication? This drug is usually given as an infusion into a vein. It is administered in [facility] or clinic by a specially trained health care professional. Talk to your pediatrician regarding the use of this medicine in children.Special  care may be needed. Overdosage: If you think you have taken too much of this medicine contact apoison control center or emergency room at once. NOTE: This medicine is only for you. Do not share this medicine with others. What if I miss a dose? It is important not to miss a dose. Call your doctor or health careprofessional if you are unable to keep an appointment. What may interact with this medication? medicines for seizures medicines to increase blood counts like filgrastim, pegfilgrastim, sargramostim some antibiotics like amikacin, gentamicin, neomycin, streptomycin, tobramycin vaccines Talk to your doctor or health care professional before taking any of thesemedicines: acetaminophen aspirin ibuprofen ketoprofen naproxen This list may not  describe all possible interactions. Give your health care provider a list of all the medicines, herbs, non-prescription drugs, or dietary supplements you use. Also tell them if you smoke, drink alcohol, or use illegaldrugs. Some items may interact with your medicine. What should I watch for while using this medication? Your condition will be monitored carefully while you are receiving this medicine. You will need important blood work done while you are taking thismedicine. This drug may make you feel generally unwell. This is not uncommon, as chemotherapy can affect healthy cells as well as cancer cells. Report any side effects. Continue your course of treatment even though you feel ill unless yourdoctor tells you to stop. In some cases, you may be given additional medicines to help with side effects.Follow all directions for their use. Call your doctor or health care professional for advice if you get a fever, chills or sore throat, or other symptoms of a cold or flu. Do not treat yourself. This drug decreases your body's ability to fight infections. Try toavoid being around people who are sick. This medicine may increase your risk to bruise or bleed. Call your doctor orhealth care professional if you notice any unusual bleeding. Be careful brushing and flossing your teeth or using a toothpick because you may get an infection or bleed more easily. If you have any dental work done,tell your dentist you are receiving this medicine. Avoid taking products that contain aspirin, acetaminophen, ibuprofen, naproxen, or ketoprofen unless instructed by your doctor. These medicines may hide afever. Do not become pregnant while taking this medicine. Women should inform their doctor if they wish to become pregnant or think they might be pregnant. There is a potential for serious side effects to an unborn child. Talk to your health care professional or pharmacist for more information. Do not breast-feed aninfant while  taking this medicine. What side effects may I notice from receiving this medication? Side effects that you should report to your doctor or health care professionalas soon as possible: allergic reactions like skin rash, itching or hives, swelling of the face, lips, or tongue signs of infection - fever or chills, cough, sore throat, pain or difficulty passing urine signs of decreased platelets or bleeding - bruising, pinpoint red spots on the skin, black, tarry stools, nosebleeds signs of decreased red blood cells - unusually weak or tired, fainting spells, lightheadedness breathing problems changes in hearing changes in vision chest pain high blood pressure low blood counts - This drug may decrease the number of white blood cells, red blood cells and platelets. You may be at increased risk for infections and bleeding. nausea and vomiting pain, swelling, redness or irritation at the injection site pain, tingling, numbness in the hands or feet problems with balance, talking, walking trouble passing urine or change in the amount of urine  Side effects that usually do not require medical attention (report to yourdoctor or health care professional if they continue or are bothersome): hair loss loss of appetite metallic taste in the mouth or changes in taste This list may not describe all possible side effects. Call your doctor for medical advice about side effects. You may report side effects to FDA at1-800-FDA-1088. Where should I keep my medication? This drug is given in a hospital or clinic and will not be stored at home. NOTE: This sheet is a summary. It may not cover all possible information. If you have questions about this medicine, talk to your doctor, pharmacist, orhealth care provider.  2022 Elsevier/Gold Standard (2007-08-29 14:38:05)

## 2021-01-23 ENCOUNTER — Telehealth: Payer: Self-pay

## 2021-01-23 NOTE — Telephone Encounter (Signed)
Pts wife called wanting to express a concern that pt left his tx yesterday w/o a wheelchair and she wants to ensure this does not occur again because he is a fall risk. She also states when he received his CT scan in July 2022, this occurred then as well.   During the discussion, I apologized this occurred and advised I would be sure to follow-up with his infusion RN regarding this. She acknowledges that pt does speaks softly because "he doesn't want to upset anyone" and maybe his request was not heard. I acknowledged this could possibly have been the case but we are here to help and I do encourage the pt to please be sure he lets his nurse and/or radiology tech know he needs assistance and ensure they acknowledge his request to confirm they are aware of his request.   I have gone to advise his infusion RN and the staff in Radiology of this.

## 2021-01-28 ENCOUNTER — Other Ambulatory Visit: Payer: Self-pay

## 2021-01-28 ENCOUNTER — Inpatient Hospital Stay (HOSPITAL_BASED_OUTPATIENT_CLINIC_OR_DEPARTMENT_OTHER): Payer: Medicare Other | Admitting: Internal Medicine

## 2021-01-28 ENCOUNTER — Encounter: Payer: Self-pay | Admitting: Internal Medicine

## 2021-01-28 ENCOUNTER — Inpatient Hospital Stay: Payer: Medicare Other

## 2021-01-28 VITALS — BP 126/91 | HR 71 | Temp 96.5°F | Resp 19 | Ht 74.0 in | Wt 132.8 lb

## 2021-01-28 DIAGNOSIS — Z5111 Encounter for antineoplastic chemotherapy: Secondary | ICD-10-CM

## 2021-01-28 DIAGNOSIS — Z923 Personal history of irradiation: Secondary | ICD-10-CM | POA: Diagnosis not present

## 2021-01-28 DIAGNOSIS — C3491 Malignant neoplasm of unspecified part of right bronchus or lung: Secondary | ICD-10-CM | POA: Diagnosis not present

## 2021-01-28 DIAGNOSIS — G893 Neoplasm related pain (acute) (chronic): Secondary | ICD-10-CM

## 2021-01-28 DIAGNOSIS — Z79899 Other long term (current) drug therapy: Secondary | ICD-10-CM | POA: Diagnosis not present

## 2021-01-28 DIAGNOSIS — C3411 Malignant neoplasm of upper lobe, right bronchus or lung: Secondary | ICD-10-CM | POA: Diagnosis not present

## 2021-01-28 DIAGNOSIS — R590 Localized enlarged lymph nodes: Secondary | ICD-10-CM | POA: Diagnosis not present

## 2021-01-28 DIAGNOSIS — K5900 Constipation, unspecified: Secondary | ICD-10-CM | POA: Diagnosis not present

## 2021-01-28 DIAGNOSIS — Z5112 Encounter for antineoplastic immunotherapy: Secondary | ICD-10-CM

## 2021-01-28 DIAGNOSIS — F32A Depression, unspecified: Secondary | ICD-10-CM | POA: Diagnosis not present

## 2021-01-28 DIAGNOSIS — C3431 Malignant neoplasm of lower lobe, right bronchus or lung: Secondary | ICD-10-CM | POA: Diagnosis not present

## 2021-01-28 DIAGNOSIS — K222 Esophageal obstruction: Secondary | ICD-10-CM

## 2021-01-28 DIAGNOSIS — I10 Essential (primary) hypertension: Secondary | ICD-10-CM | POA: Diagnosis not present

## 2021-01-28 DIAGNOSIS — C3490 Malignant neoplasm of unspecified part of unspecified bronchus or lung: Secondary | ICD-10-CM

## 2021-01-28 DIAGNOSIS — R630 Anorexia: Secondary | ICD-10-CM | POA: Diagnosis not present

## 2021-01-28 DIAGNOSIS — N400 Enlarged prostate without lower urinary tract symptoms: Secondary | ICD-10-CM | POA: Diagnosis not present

## 2021-01-28 DIAGNOSIS — I2510 Atherosclerotic heart disease of native coronary artery without angina pectoris: Secondary | ICD-10-CM | POA: Diagnosis not present

## 2021-01-28 DIAGNOSIS — Z7901 Long term (current) use of anticoagulants: Secondary | ICD-10-CM | POA: Diagnosis not present

## 2021-01-28 DIAGNOSIS — C787 Secondary malignant neoplasm of liver and intrahepatic bile duct: Secondary | ICD-10-CM | POA: Diagnosis not present

## 2021-01-28 DIAGNOSIS — R1310 Dysphagia, unspecified: Secondary | ICD-10-CM | POA: Diagnosis not present

## 2021-01-28 DIAGNOSIS — I4891 Unspecified atrial fibrillation: Secondary | ICD-10-CM | POA: Diagnosis not present

## 2021-01-28 LAB — CMP (CANCER CENTER ONLY)
ALT: 11 U/L (ref 0–44)
AST: 20 U/L (ref 15–41)
Albumin: 3.1 g/dL — ABNORMAL LOW (ref 3.5–5.0)
Alkaline Phosphatase: 147 U/L — ABNORMAL HIGH (ref 38–126)
Anion gap: 12 (ref 5–15)
BUN: 8 mg/dL (ref 8–23)
CO2: 25 mmol/L (ref 22–32)
Calcium: 9.4 mg/dL (ref 8.9–10.3)
Chloride: 92 mmol/L — ABNORMAL LOW (ref 98–111)
Creatinine: 0.59 mg/dL — ABNORMAL LOW (ref 0.61–1.24)
GFR, Estimated: >60 mL/min (ref >60)
Glucose, Bld: 116 mg/dL — ABNORMAL HIGH (ref 70–99)
Potassium: 3.9 mmol/L (ref 3.5–5.1)
Sodium: 129 mmol/L — ABNORMAL LOW (ref 135–145)
Total Bilirubin: 1 mg/dL (ref 0.3–1.2)
Total Protein: 7.2 g/dL (ref 6.5–8.1)

## 2021-01-28 LAB — CBC WITH DIFFERENTIAL (CANCER CENTER ONLY)
Abs Immature Granulocytes: 0.05 10*3/uL (ref 0.00–0.07)
Basophils Absolute: 0 10*3/uL (ref 0.0–0.1)
Basophils Relative: 1 %
Eosinophils Absolute: 0 10*3/uL (ref 0.0–0.5)
Eosinophils Relative: 0 %
HCT: 36.8 % — ABNORMAL LOW (ref 39.0–52.0)
Hemoglobin: 12.8 g/dL — ABNORMAL LOW (ref 13.0–17.0)
Immature Granulocytes: 2 %
Lymphocytes Relative: 10 %
Lymphs Abs: 0.3 10*3/uL — ABNORMAL LOW (ref 0.7–4.0)
MCH: 31.8 pg (ref 26.0–34.0)
MCHC: 34.8 g/dL (ref 30.0–36.0)
MCV: 91.5 fL (ref 80.0–100.0)
Monocytes Absolute: 0.2 10*3/uL (ref 0.1–1.0)
Monocytes Relative: 9 %
Neutro Abs: 2.2 10*3/uL (ref 1.7–7.7)
Neutrophils Relative %: 78 %
Platelet Count: 167 10*3/uL (ref 150–400)
RBC: 4.02 MIL/uL — ABNORMAL LOW (ref 4.22–5.81)
RDW: 13.5 % (ref 11.5–15.5)
WBC Count: 2.8 10*3/uL — ABNORMAL LOW (ref 4.0–10.5)
nRBC: 0 % (ref 0.0–0.2)

## 2021-01-28 MED ORDER — MEGESTROL ACETATE 625 MG/5ML PO SUSP: 625.0000 mg | Freq: Every day | ORAL | 0 refills | Status: DC

## 2021-01-28 NOTE — Progress Notes (Signed)
[place] [phone]   Fax:([phone]) [phone]  OFFICE PROGRESS NOTE  [place], Dibas, MD [address] [place] [address]  DIAGNOSIS:  Metastatic non-small cell lung cancer initially diagnosed as stage IIIA/B (T3, N1/N2, M0) non-small cell lung cancer, adenocarcinoma.  He presented with a right upper lobe lung mass with direct extension across the major fissure into the superior segment of the right lower lobe and ipsilateral hilar lymphadenopathy.  There is questionable subcarinal lymphadenopathy versus esophageal hypermetabolism this was evaluated by gastroenterology with upper endoscopy on 01/30/2020 and there was no mass lesion or malignancy in that area. The patient was diagnosed in May 2021.    Biomarker Findings Microsatellite status - MS-Stable Tumor Mutational Burden - 9 Muts/Mb Genomic Findings For a complete list of the genes assayed, please refer to the Appendix. KRAS G12C CDKN2A/B [id number] S12* NKX2-1 amplification - equivocal? PMS2 R3Q TP53 P59fs*38 7 Disease relevant genes with no reportable alterations: ALK, BRAF, EGFR, ERBB2, MET, RET, ROS1   PDL1: 70%  REPEAT MOLECULAR STUDIES BY CARIS ON THE LIVER BIOPSY:  Results with Therapy Associations BIOMARKER METHOD ANALYTE RESULT THERAPY ASSOCIATION BIOMARKER LEVEL* . alectinib, ceritinib, crizotinib, lorlatinib Level 1 . IHC Protein Negative  0 brigatinib Level 2 . ALK Seq RNA-Tumor Fusion Not Detected LACK OF BENEFIT alectinib, brigatinib, ceritinib, crizotinib, lorlatinib Level 2 .BRAF Seq DNA-Tumor Mutation Not Detected LACK OF BENEFIT dabrafenib and trametinib combination therapy, vemurafenib Level 2 .EGFR Seq DNA-Tumor Mutation Not Detected LACK OF BENEFIT erlotinib, gefitinib Level 2 .KRAS Seq DNA-Tumor Mutation Not Detected LACK OF BENEFIT sotorasib Level 2 .RET Seq RNA-Tumor Fusion Not Detected LACK OF BENEFIT pralsetinib, selpercatinib Level  2 .ROS1 Seq RNA-Tumor Fusion Not Detected LACK OF BENEFIT ceritinib, crizotinib, entrectinib, lorlatinib Level 2 . CNA-Seq DNA-Tumor Amplification Not Detected .MET Seq RNA-Tumor Variant Transcript Not Detected LACK OFBENEFIT crizotinib Level 3  PRIOR THERAPY:  1) Weekly concurrent chemoradiation with carboplatin for an AUC of 2 and paclitaxel 45 mg/m.  First dose expected on 11/26/2019. Status post 7 cycles.   Last dose was given 01/07/2020 with partial response. 2) Consolidation treatment with immunotherapy with Imfinzi 1500 mg IV every 4 weeks.  First dose 02/19/2020. Status post 6 cycles.  Discontinued secondary to disease progression. 3) Lumakras (Sotorasib) 960 mg p.o. daily.  First dose started December 12, 2020.  Status post 1 months of treatment discontinued secondary to disease progression in the liver    CURRENT THERAPY: Systemic chemotherapy with carboplatin for AUC of 5, Alimta 500 Mg/M2 and Keytruda 200 Mg IV every 3 weeks.  First dose 01/22/2021.  Status post 1 cycle.  INTERVAL HISTORY: Christopher Harding 70 y.o. male returns to the clinic today for follow-up visit accompanied by his wife.  The patient continues to complain of increasing fatigue and weakness as well as lack of concentration and weight loss.  He lost around 5 pounds since his last visit.  He tolerated the first week of his chemotherapy with carboplatin, Alimta and Keytruda fairly well except for 1 or 2 days of diarrhea followed by constipation.  He has no current nausea, vomiting, diarrhea or constipation.  He has no chest pain, shortness of breath, cough or hemoptysis.  He continues to have right upper quadrant pain with radiation to the right shoulder.  He is here today for evaluation and repeat blood work.   MEDICAL HISTORY: Past Medical History:  Diagnosis Date   A-fib [facility])    s/p DCCV 12/07/18  [facility]cer [facility])    Depression    situational   Dysrhythmia    Hypertension     ALLERGIES:  has No Known  Allergies.  MEDICATIONS:  Current Outpatient Medications  Medication Sig Dispense Refill   folic acid (FOLVITE) 1 MG tablet Take 1 tablet (1 mg total) by mouth daily. 30 tablet 4   metoprolol tartrate (LOPRESSOR) 50 MG tablet Take 50 mg by mouth 2 (two) times daily with a meal.     mirtazapine (REMERON) 30 MG tablet TAKE 1 TABLET BY MOUTH AT BEDTIME. 90 tablet 1   morphine (MS CONTIN) 30 MG 12 hr tablet Take 1 tablet (30 mg total) by mouth every 12 (twelve) hours. 60 tablet 0   naproxen sodium (ALEVE) 220 MG tablet Take 220 mg by mouth daily as needed (pain).     oxyCODONE (OXY IR/ROXICODONE) 5 MG immediate release tablet Take 1 tablet (5 mg total) by mouth 3 (three) times daily as needed for severe pain. (Patient not taking: Reported on 01/15/2021) 30 tablet 0   polyethylene glycol (MIRALAX / GLYCOLAX) 17 g packet Take 17 g by mouth daily.     prochlorperazine (COMPAZINE) 10 MG tablet Take 1 tablet (10 mg total) by mouth every 6 (six) hours as needed for nausea or vomiting. 30 tablet 0   XARELTO 20 MG TABS tablet Take 20 mg by mouth daily.     No current facility-administered medications for this visit.    SURGICAL HISTORY:  Past Surgical History:  Procedure Laterality Date   CARDIOVERSION N/A 12/07/2018   Procedure: CARDIOVERSION;  Surgeon: Josue Hector, MD;  Location: [place];  Service: Cardiovascular;  Laterality: N/A;   FINE NEEDLE ASPIRATION  10/30/2019   Procedure: FINE NEEDLE ASPIRATION (FNA) LINEAR;  Surgeon: Candee Furbish, MD;  Location: [facility] ENDOSCOPY;  Service: Pulmonary;;   INGUINAL HERNIA REPAIR Right 01/25/2019   Procedure: RIGHT INGUINAL HERNIA REPAIR WITH MESH;  Surgeon: Coralie Keens, MD;  Location: [place];  Service: General;  Laterality: Right;  TAP BLOCK   INSERTION OF MESH Right 01/25/2019   Procedure: Insertion Of Mesh;  Surgeon: Coralie Keens, MD;  Location: [place];  Service: General;  Laterality: Right;   VIDEO BRONCHOSCOPY WITH ENDOBRONCHIAL ULTRASOUND  N/A 10/30/2019   Procedure: VIDEO BRONCHOSCOPY WITH ENDOBRONCHIAL ULTRASOUND;  Surgeon: Candee Furbish, MD;  Location[facility] END[facility]COPY;  Service: Pulmonary;  Laterality: N/A;    REVIEW OF SYSTEMS:  A comprehensive review of systems was negative except for: Constitutional: positive for anorexia, fatigue, and weight loss Respiratory: positive for pleurisy/chest pain Gastrointestinal: positive for abdominal pain and dysphagia   PHYSICAL EXAMINATION: General appearance: alert, cooperative, fatigued, and no distress Head: Normocephalic, without obvious abnormality, atraumatic Neck: no adenopathy, no JVD, supple, symmetrical, trachea midline, and thyroid not enlarged, symmetric, no tenderness/mass/nodules Lymph nodes: Cervical, supraclavicular, and axillary nodes normal. Resp: clear to auscultation bilaterally Back: symmetric, no curvature. ROM normal. No CVA tenderness. Cardio: regular rate and rhythm, S1, S2 normal, no murmur, click, rub or gallop GI: Tender to palpation in the epigastric area Extremities: extremities normal, atraumatic, no cyanosis or edema  ECOG PERFORMANCE STATUS: 1 - Symptomatic but completely ambulatory  Blood pressure (!) 126/91, pulse 71, temperature (!) 96.5 F (35.8 C), temperature source Tympanic, resp. rate 19, height $RemoveBe'6\' 2"'[id number]$  (1.88 m), weight 132 lb 12.8 oz (60.2 kg), SpO2 98 %.  LABORATORY DATA: Lab Results  Component Value Date   WBC 2.8 (L) 01/28/2021   HGB 12.8 (L) 01/28/2021   HCT  36.8 (L) 01/28/2021   MCV 91.5 01/28/2021   PLT 167 01/28/2021      Chemistry      Component Value Date/Time   NA 129 (L) 01/22/2021 1224   NA 129 (L) 11/24/2018 1520   K 4.0 01/22/2021 1224   CL 95 (L) 01/22/2021 1224   CO2 23 01/22/2021 1224   BUN 6 (L) 01/22/2021 1224   BUN 8 11/24/2018 1520   CREATININE 0.58 (L) 01/22/2021 1224      Component Value Date/Time   CALCIUM 9.1 01/22/2021 1224   ALKPHOS 216 (H) 01/22/2021 1224   AST 26 01/22/2021 1224   ALT 15  01/22/2021 1224   BILITOT 0.8 01/22/2021 1224       RADIOGRAPHIC STUDIES: CT Chest W Contrast  Result Date: 01/12/2021 CLINICAL DATA:  Restaging non-small cell lung cancer. Immunotherapy. EXAM: CT CHEST, ABDOMEN, AND PELVIS WITH CONTRAST TECHNIQUE: Multidetector CT imaging of the chest, abdomen and pelvis was performed following the standard protocol during bolus administration of intravenous contrast. CONTRAST:  63mL OMNIPAQUE IOHEXOL 350 MG/ML SOLN COMPARISON:  11/06/2020 FINDINGS: CT CHEST FINDINGS Cardiovascular: Trace amount of gas along the anterior mediastinum is thought to probably be injection related venous gas based on distribution. Accordingly this is not thought to represent pneumomediastinum. Coronary, aortic arch, and branch vessel atherosclerotic vascular disease. Mediastinum/Nodes: No pathologic adenopathy identified. Lungs/Pleura: Mild increase in the small right pleural effusion which may have loculated components tracking along the posterior mid lung and apex. Similar appearance of bandlike consolidation particularly medially in the right upper lobe and in the superior segment right lower lobe. Proximal airway plugging along the right middle lobe bronchus. Densely calcified right basilar nodule, image 139 series 8, stable. Stable 3 mm right-sided nodule on image 124 series 8. Bilateral airway thickening. The 4 by 3 mm left upper lobe nodule on image 56 series 8 previously measured 0.2 cm on 11/06/2020. Tree-in-bud reticulonodular opacity in the left lower lobe on image 132 series 8 similar to prior and probably from atypical infectious bronchiolitis given the pattern. Musculoskeletal: Old right rib deformities from healed fractures. CT ABDOMEN PELVIS FINDINGS Hepatobiliary: Further enlargement of the dominant metastatic lesion involving the left hepatic lobe, measuring 9.3 by 7.1 cm on image 64 series 2, formerly about 6.7 by 5.8 cm by my measurements. Increased associated biliary  dilatation in the lateral segment left hepatic lobe. Rim enhancing 2.6 by 2.4 cm lesion in the dome of the liver on image 59 series 2, formerly 1.6 by 1.3 cm, this straddles between the right and left hepatic lobe. There has been some interval central necrosis of the inferior right hepatic lobe lesion which measures 3.1 by 2.8 cm on image 87 series 2, formerly with more diffuse enhancement and formerly measuring 3.0 by 2.3 cm. The gallbladder appears unremarkable. No extrahepatic biliary dilatation. Pancreas: No pancreatic mass identified. Spleen: Upper normal splenic size. Adrenals/Urinary Tract: Both adrenal glands appear normal. No significant renal abnormality. Stomach/Bowel: Air-levels in the distal colon indicating diarrheal process. Suspected sigmoid colon diverticulosis without compelling findings of active diverticulitis. No substantially dilated small bowel. Appendix within normal limits. Vascular/Lymphatic: Aortoiliac atherosclerotic vascular disease. Worsening adenopathy in the peripancreatic region and porta hepatis, within upper peripancreatic node measuring 2.2 cm in short axis on image 66 series 2 (formerly 0.9 cm), and a portacaval lymph node measuring 1.4 cm in short axis on image 72 series 2 (formerly 0.5 cm). Reproductive: Mild prostatomegaly. Speckled linear calcifications along the spermatic cord. Other: No supplemental non-categorized findings.  Musculoskeletal: Partially fused right sacroiliac joint. Notable Schmorl's nodes at the L4-5 intervertebral space with associated endplate sclerosis. IMPRESSION: 1. Increase in size of hepatic metastatic lesions, with new peripancreatic and porta hepatis adenopathy compatible with progressive malignancy. 2. A 4 by 3 mm left upper lobe nodule previously measured 2 mm in diameter, and could represent an early metastatic lesion. 3. Mildly increased volume of loculated right pleural effusion, although overall the pleural effusion is still small. 4. Air  fluid levels in the distal colon indicating diarrheal process. 5. Similar appearance of the bandlike consolidation medially in the right upper lobe and superior segment right lower lobe compared to previous. Other imaging findings of potential clinical significance: Aortic Atherosclerosis (ICD10-I70.0). Coronary atherosclerosis. Proximal airway plugging along the right middle lobe bronchus. Mild atypical infectious bronchiolitis in the left lower lobe. Mild prostatomegaly. Degenerative disc disease at L4-5. Electronically Signed   By: Van Clines M.D.   On: 01/12/2021 11:56   CT Abdomen Pelvis W Contrast  Result Date: 01/12/2021 CLINICAL DATA:  Restaging non-small cell lung cancer. Immunotherapy. EXAM: CT CHEST, ABDOMEN, AND PELVIS WITH CONTRAST TECHNIQUE: Multidetector CT imaging of the chest, abdomen and pelvis was performed following the standard protocol during bolus administration of intravenous contrast. CONTRAST:  27mL OMNIPAQUE IOHEXOL 350 MG/ML SOLN COMPARISON:  11/06/2020 FINDINGS: CT CHEST FINDINGS Cardiovascular: Trace amount of gas along the anterior mediastinum is thought to probably be injection related venous gas based on distribution. Accordingly this is not thought to represent pneumomediastinum. Coronary, aortic arch, and branch vessel atherosclerotic vascular disease. Mediastinum/Nodes: No pathologic adenopathy identified. Lungs/Pleura: Mild increase in the small right pleural effusion which may have loculated components tracking along the posterior mid lung and apex. Similar appearance of bandlike consolidation particularly medially in the right upper lobe and in the superior segment right lower lobe. Proximal airway plugging along the right middle lobe bronchus. Densely calcified right basilar nodule, image 139 series 8, stable. Stable 3 mm right-sided nodule on image 124 series 8. Bilateral airway thickening. The 4 by 3 mm left upper lobe nodule on image 56 series 8 previously  measured 0.2 cm on 11/06/2020. Tree-in-bud reticulonodular opacity in the left lower lobe on image 132 series 8 similar to prior and probably from atypical infectious bronchiolitis given the pattern. Musculoskeletal: Old right rib deformities from healed fractures. CT ABDOMEN PELVIS FINDINGS Hepatobiliary: Further enlargement of the dominant metastatic lesion involving the left hepatic lobe, measuring 9.3 by 7.1 cm on image 64 series 2, formerly about 6.7 by 5.8 cm by my measurements. Increased associated biliary dilatation in the lateral segment left hepatic lobe. Rim enhancing 2.6 by 2.4 cm lesion in the dome of the liver on image 59 series 2, formerly 1.6 by 1.3 cm, this straddles between the right and left hepatic lobe. There has been some interval central necrosis of the inferior right hepatic lobe lesion which measures 3.1 by 2.8 cm on image 87 series 2, formerly with more diffuse enhancement and formerly measuring 3.0 by 2.3 cm. The gallbladder appears unremarkable. No extrahepatic biliary dilatation. Pancreas: No pancreatic mass identified. Spleen: Upper normal splenic size. Adrenals/Urinary Tract: Both adrenal glands appear normal. No significant renal abnormality. Stomach/Bowel: Air-levels in the distal colon indicating diarrheal process. Suspected sigmoid colon diverticulosis without compelling findings of active diverticulitis. No substantially dilated small bowel. Appendix within normal limits. Vascular/Lymphatic: Aortoiliac atherosclerotic vascular disease. Worsening adenopathy in the peripancreatic region and porta hepatis, within upper peripancreatic node measuring 2.2 cm in short axis on image 66 series  2 (formerly 0.9 cm), and a portacaval lymph node measuring 1.4 cm in short axis on image 72 series 2 (formerly 0.5 cm). Reproductive: Mild prostatomegaly. Speckled linear calcifications along the spermatic cord. Other: No supplemental non-categorized findings. Musculoskeletal: Partially fused right  sacroiliac joint. Notable Schmorl's nodes at the L4-5 intervertebral space with associated endplate sclerosis. IMPRESSION: 1. Increase in size of hepatic metastatic lesions, with new peripancreatic and porta hepatis adenopathy compatible with progressive malignancy. 2. A 4 by 3 mm left upper lobe nodule previously measured 2 mm in diameter, and could represent an early metastatic lesion. 3. Mildly increased volume of loculated right pleural effusion, although overall the pleural effusion is still small. 4. Air fluid levels in the distal colon indicating diarrheal process. 5. Similar appearance of the bandlike consolidation medially in the right upper lobe and superior segment right lower lobe compared to previous. Other imaging findings of potential clinical significance: Aortic Atherosclerosis (ICD10-I70.0). Coronary atherosclerosis. Proximal airway plugging along the right middle lobe bronchus. Mild atypical infectious bronchiolitis in the left lower lobe. Mild prostatomegaly. Degenerative disc disease at L4-5. Electronically Signed   By: Van Clines M.D.   On: 01/12/2021 11:56     ASSESSMENT AND PLAN: This is a very pleasant 70 years old white male with metastatic non-small cell lung cancer initially diagnosed as stage IIIA/B (T3, N1/N2, M0) non-small cell lung cancer, adenocarcinoma presented with right upper lobe lung mass with direct extension across the major fissure into the superior segment of the right lower lobe and ipsilateral hilar lymphadenopathy as well as questionable subcarinal lymphadenopathy.  The patient had evidence for disease metastasis confirmed in June 2022. The patient completed a course of concurrent chemoradiation with weekly carboplatin and paclitaxel status post 7 cycles.  He tolerated this treatment well with no concerning adverse effect except for mild sore throat and mouth. The patient had partial response to this treatment. The patient is currently undergoing a course  of consolidation treatment with immunotherapy with Imfinzi 1500 mg IV every 4 weeks status post 6 cycles.  He has been tolerating his treatment well with no concerning adverse effects. His last CT scan of the chest several months ago showed no concerning findings for disease progression in the chest but the patient has a highly suspicious liver lesion concerning for metastatic disease.  The patient underwent ultrasound-guided core biopsy of this lesion. The final pathology read at [facility] in addition to [facility] showed no evidence of malignancy and there was suspicious inflammatory process of biliary origin. He has been on observation and repeat CT scan of the chest, abdomen pelvis performed few weeks ago showed further increase in the size of the suspicious liver metastasis.  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 He has no interest in systemic chemotherapy.  The patient is started treatment with Lumakras (Sotorasib) 960 mg p.o. daily 4 days ago and has been tolerating this treatment well. He continues to have increasing fatigue and weakness as well as lack of appetite and weight loss.  He also has significant pain in the epigastric area. 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 The patient started treatment with Lumakras (Sotorasib) 960 mg p.o. daily status post 1 months.  He tolerated the treatment well except for fatigue and occasional  diarrhea. He had repeat CT scan of the chest, abdomen pelvis performed recently. I personally and independently reviewed the scan images and discussed the result and showed the images to the patient and his wife. Unfortunately his scan showed further disease progression in the liver with enlargement of the dominant mass and development of new lesions in the liver.  His treatment with Lumakras (Sotorasib) was discontinued secondary to disease progression. The patient is currently undergoing palliative systemic chemotherapy with carboplatin for AUC of 5, Alimta 500 Mg/M2 and Keytruda 200 Mg IV every 3 weeks.  Status post 1 cycle. The patient tolerated the first week of his treatment fairly well except for mild nausea and 1 day of diarrhea but now he has constipation.  He continues to have lack of appetite and weight loss. I recommended for him to proceed with his treatment as planned and he is expected to start cycle #2 in 2 weeks. For the lack of appetite and weight loss, I will start the patient on Megace ES 625 mg p.o. daily. For the dysphagia, he will reach out to his gastroenterologist for consideration of esophageal dilatation. For the constipation, the patient will continue on stool softener and MiraLAX on as-needed basis. He was advised to call immediately if he has any concerning symptoms in the interval. The patient voices understanding of current disease status and treatment options and is in agreement with the current care plan.  All questions were answered. The patient knows to call the clinic with any problems, questions or concerns. We can certainly see the patient much sooner if necessary.  Disclaimer: This note was dictated with voice recognition software. Similar sounding words can inadvertently be transcribed and may not be corrected upon review.

## 2021-01-29 ENCOUNTER — Ambulatory Visit: Payer: Medicare Other | Admitting: Internal Medicine

## 2021-01-29 ENCOUNTER — Other Ambulatory Visit: Payer: Medicare Other

## 2021-02-02 ENCOUNTER — Telehealth: Payer: Self-pay | Admitting: *Deleted

## 2021-02-02 ENCOUNTER — Other Ambulatory Visit: Payer: Self-pay | Admitting: Internal Medicine

## 2021-02-02 MED ORDER — MORPHINE SULFATE ER 30 MG PO TBCR: 30.0000 mg | EXTENDED_RELEASE_TABLET | Freq: Two times a day (BID) | ORAL | 0 refills | Status: DC

## 2021-02-02 NOTE — Telephone Encounter (Signed)
Patients wife called to request refill of Morphine 30 mg q 12 hour refilled to [facility].  Routed to MD to review.

## 2021-02-05 ENCOUNTER — Inpatient Hospital Stay: Payer: Medicare Other | Attending: Physician Assistant

## 2021-02-05 ENCOUNTER — Other Ambulatory Visit: Payer: Self-pay

## 2021-02-05 DIAGNOSIS — Z79899 Other long term (current) drug therapy: Secondary | ICD-10-CM | POA: Diagnosis not present

## 2021-02-05 DIAGNOSIS — C3411 Malignant neoplasm of upper lobe, right bronchus or lung: Secondary | ICD-10-CM | POA: Insufficient documentation

## 2021-02-05 DIAGNOSIS — Z5111 Encounter for antineoplastic chemotherapy: Secondary | ICD-10-CM | POA: Diagnosis present

## 2021-02-05 DIAGNOSIS — C3491 Malignant neoplasm of unspecified part of right bronchus or lung: Secondary | ICD-10-CM

## 2021-02-05 LAB — CMP (CANCER CENTER ONLY)
ALT: 10 U/L (ref 0–44)
AST: 16 U/L (ref 15–41)
Albumin: 3.2 g/dL — ABNORMAL LOW (ref 3.5–5.0)
Alkaline Phosphatase: 161 U/L — ABNORMAL HIGH (ref 38–126)
Anion gap: 17 — ABNORMAL HIGH (ref 5–15)
BUN: 6 mg/dL — ABNORMAL LOW (ref 8–23)
CO2: 22 mmol/L (ref 22–32)
Calcium: 9.5 mg/dL (ref 8.9–10.3)
Chloride: 93 mmol/L — ABNORMAL LOW (ref 98–111)
Creatinine: 0.66 mg/dL (ref 0.61–1.24)
GFR, Estimated: >60 mL/min (ref >60)
Glucose, Bld: 117 mg/dL — ABNORMAL HIGH (ref 70–99)
Potassium: 3.9 mmol/L (ref 3.5–5.1)
Sodium: 132 mmol/L — ABNORMAL LOW (ref 135–145)
Total Bilirubin: 0.8 mg/dL (ref 0.3–1.2)
Total Protein: 7.1 g/dL (ref 6.5–8.1)

## 2021-02-05 LAB — CBC WITH DIFFERENTIAL (CANCER CENTER ONLY)
Abs Immature Granulocytes: 0.02 10*3/uL (ref 0.00–0.07)
Basophils Absolute: 0 10*3/uL (ref 0.0–0.1)
Basophils Relative: 0 %
Eosinophils Absolute: 0 10*3/uL (ref 0.0–0.5)
Eosinophils Relative: 0 %
HCT: 34 % — ABNORMAL LOW (ref 39.0–52.0)
Hemoglobin: 12.1 g/dL — ABNORMAL LOW (ref 13.0–17.0)
Immature Granulocytes: 1 %
Lymphocytes Relative: 9 %
Lymphs Abs: 0.4 10*3/uL — ABNORMAL LOW (ref 0.7–4.0)
MCH: 31.5 pg (ref 26.0–34.0)
MCHC: 35.6 g/dL (ref 30.0–36.0)
MCV: 88.5 fL (ref 80.0–100.0)
Monocytes Absolute: 0.7 10*3/uL (ref 0.1–1.0)
Monocytes Relative: 17 %
Neutro Abs: 2.8 10*3/uL (ref 1.7–7.7)
Neutrophils Relative %: 73 %
Platelet Count: 161 10*3/uL (ref 150–400)
RBC: 3.84 MIL/uL — ABNORMAL LOW (ref 4.22–5.81)
RDW: 13.5 % (ref 11.5–15.5)
WBC Count: 3.8 10*3/uL — ABNORMAL LOW (ref 4.0–10.5)
nRBC: 0 % (ref 0.0–0.2)

## 2021-02-05 LAB — TSH: TSH: 1.152 u[IU]/mL (ref 0.320–4.118)

## 2021-02-06 NOTE — Progress Notes (Signed)
[place] [facility] OFFICE PROGRESS NOTE  [place]oir[place], Dibas, MD [address] [address]  DIAGNOSIS:  Metastatic non-small cell lung cancer initially diagnosed as stage IIIA/B (T3, N1/N2, M0) non-small cell lung cancer, adenocarcinoma.  He presented with a right upper lobe lung mass with direct extension across the major fissure into the superior segment of the right lower lobe and ipsilateral hilar lymphadenopathy.  There is questionable subcarinal lymphadenopathy versus esophageal hypermetabolism this was evaluated by gastroenterology with upper endoscopy on 01/30/2020 and there was no mass lesion or malignancy in that area. The patient was diagnosed in May 2021.     Biomarker Findings Microsatellite status - MS-Stable Tumor Mutational Burden - 9 Muts/Mb Genomic Findings For a complete list of the genes assayed, please refer to the Appendix. KRAS G12C CDKN2A/B p16INK4a S12* NKX2-1 amplification - equivocal? PMS2 R3Q TP53 P9fs*38 7 Disease relevant genes with no reportable alterations: ALK, BRAF, EGFR, ERBB2, MET, RET, ROS1  REPEAT MOLECULAR STUDIES BY [facility] ON THE LIVER BIOPSY:  Results with Therapy Associations BIOMARKER METHOD ANALYTE RESULT THERAPY ASSOCIATION BIOMARKER LEVEL* . alectinib, ceritinib, crizotinib, lorlatinib Level 1 . IHC Protein Negative  0 brigatinib Level 2 . ALK Seq RNA-Tumor Fusion Not Detected LACK OF BENEFIT alectinib, brigatinib, ceritinib, crizotinib, lorlatinib Level 2 .BRAF Seq DNA-Tumor Mutation Not Detected LACK OF BENEFIT dabrafenib and trametinib combination therapy, vemurafenib Level 2 .EGFR Seq DNA-Tumor Mutation Not Detected LACK OF BENEFIT erlotinib, gefitinib Level 2 .KRAS Seq DNA-Tumor Mutation Not Detected LACK OF BENEFIT sotorasib Level 2 .RET Seq RNA-Tumor Fusion Not Detected LACK OF BENEFIT pralsetinib, selpercatinib Level 2 .ROS1 Seq RNA-Tumor Fusion Not Detected LACK OF BENEFIT ceritinib,  crizotinib, entrectinib, lorlatinib Level 2 . CNA-Seq DNA-Tumor Amplification Not Detected .MET Seq RNA-Tumor Variant Transcript Not Detected LACK OFBENEFIT crizotinib Level 3   PDL1: 70%  PRIOR THERAPY:  1) Weekly concurrent chemoradiation with carboplatin for an AUC of 2 and paclitaxel 45 mg/m.  First dose expected on 11/26/2019. Status post 7 cycles.   Last dose was given 01/07/2020 with partial response. 2) Consolidation treatment with immunotherapy with Imfinzi 1500 mg IV every 4 weeks.  First dose 02/19/2020. Status post 6 cycles.  Discontinued secondary to disease progression. 3) Lumakras (Sotorasib) 960 mg p.o. daily.  First dose started December 12, 2020.  Status post 1 months of treatment discontinued secondary to disease progression in the liver  CURRENT THERAPY: Systemic chemotherapy with carboplatin for AUC of 5, Alimta 500 Mg/M2 and Keytruda 200 Mg IV every 3 weeks.  First dose 01/22/2021.  Status post 1 cycle.   INTERVAL HISTORY: Christopher Harding 70 y.o. male returns to the clinic today for a follow-up visit accompanied by his wife.  The patient recently was found to have evidence of disease progression in the liver.  Therefore, he was switched to systemic palliative chemotherapy with carboplatin, Alimta, and Keytruda.  At his last appointment, the patient was endorsing, fatigue, weakness, and weight loss.  Dr. Julien Nordmann started him on Megace. He has not started this yet. He lost 4 more pounds. He is currently not taking boost/ensure. Overall, he continues to have fatigue and weight loss. He is scheduled to see a member of the nutritionist team today. He denies any fever, chills, or night sweats. He denies any shortness of breath because he does not have the energy to exert himself, cough, chest pain, or hemoptysis.  He denies any current nausea, vomiting, diarrhea, or constipation. He has pain medication for the RUQ pain  from the metastatic disease to the liver. He does not need a refill at this  time. He denies any headache or visual changes.  He is here today for evaluation and repeat blood work before considering starting cycle #2.  MEDICAL HISTORY: Past Medical History:  Diagnosis Date   A-fib [facility])    s/p DCCV 12/07/18   Cancer [facility])    Depression    situational   Dysrhythmia    Hypertension     ALLERGIES:  has No Known Allergies.  MEDICATIONS:  Current Outpatient Medications  Medication Sig Dispense Refill   folic acid (FOLVITE) 1 MG tablet Take 1 tablet (1 mg total) by mouth daily. 30 tablet 4   metoprolol tartrate (LOPRESSOR) 50 MG tablet Take 50 mg by mouth 2 (two) times daily with a meal.     mirtazapine (REMERON) 30 MG tablet TAKE 1 TABLET BY MOUTH AT BEDTIME. 90 tablet 1   morphine (MS CONTIN) 30 MG 12 hr tablet Take 1 tablet (30 mg total) by mouth every 12 (twelve) hours. 60 tablet 0   oxyCODONE (OXY IR/ROXICODONE) 5 MG immediate release tablet Take 1 tablet (5 mg total) by mouth 3 (three) times daily as needed for severe pain. 30 tablet 0   polyethylene glycol (MIRALAX / GLYCOLAX) 17 g packet Take 17 g by mouth daily.     prochlorperazine (COMPAZINE) 10 MG tablet Take 1 tablet (10 mg total) by mouth every 6 (six) hours as needed for nausea or vomiting. 30 tablet 0   XARELTO 20 MG TABS tablet Take 20 mg by mouth daily.     megestrol (MEGACE ES) 625 MG/5ML suspension Take 5 mLs (625 mg total) by mouth daily. (Patient not taking: Reported on 02/12/2021) 150 mL 0   naproxen sodium (ALEVE) 220 MG tablet Take 220 mg by mouth daily as needed (pain). (Patient not taking: Reported on 02/12/2021)     No current facility-administered medications for this visit.    SURGICAL HISTORY:  Past Surgical History:  Procedure Laterality Date   CARDIOVERSION N/A 12/07/2018   Procedure: CARDIOVERSION;  Surgeon: Josue Hector, MD;  Location: [facility];  Service: Cardiovascular;  Laterality: N/A;   FINE NEEDLE ASPIRATION  10/30/2019   Procedure: FINE NEEDLE ASPIRATION (FNA) LINEAR;   Surgeon: Candee Furbish, MD;  Location: [facility] ENDOSCOPY;  Service: Pulmonary;;   INGUINAL HERNIA REPAIR Right 01/25/2019   Procedure: RIGHT INGUINAL HERNIA REPAIR WITH MESH;  Surgeon: Coralie Keens, MD;  Location: [place] [place];  Service: General;  Laterality: Right;  TAP BLOCK   INSERTION OF MESH Right 01/25/2019   Procedure: Insertion Of Mesh;  Surgeon: Coralie Keens, MD;  Location: [facility];  Service: General;  Laterality: Right;   VIDEO BRONCHOSCOPY WITH ENDOBRONCHIAL ULTRASOUND N/A 10/30/2019   Procedure: VIDEO BRONCHOSCOPY WITH ENDOBRONCHIAL ULTRASOUND;  Surgeon: Candee Furbish, MD;  Location: [facility] ENDOSCOPY;  Service: Pulmonary;  Laterality: N/A;    REVIEW OF SYSTEMS:   Review of Systems  Constitutional: Positive for fatigue, weight loss, and decreased appetite. Negative for chills and fever. HENT: Negative for mouth sores, nosebleeds, sore throat and trouble swallowing.   Eyes: Negative for eye problems and icterus.  Respiratory: Negative for cough, hemoptysis, shortness of breath and wheezing.   Cardiovascular: Negative for chest pain and leg swelling.  Gastrointestinal: Positive for frequent RUQ pain. Negative for constipation, diarrhea, nausea and vomiting.  Genitourinary: Negative for bladder incontinence, difficulty urinating, dysuria, frequency and hematuria.   Musculoskeletal: Negative for back pain, gait problem, neck pain  and neck stiffness.  Skin: Negative for itching and rash.  Neurological: Negative for dizziness, extremity weakness, gait problem, headaches, light-headedness and seizures.  Hematological: Negative for adenopathy. Does not bruise/bleed easily.  Psychiatric/Behavioral: Negative for confusion, depression and sleep disturbance. The patient is not nervous/anxious.     PHYSICAL EXAMINATION:  Pulse (!) 57, temperature (!) 96.3 F (35.7 C), resp. rate 17, weight 128 lb 14.4 oz (58.5 kg), SpO2 100 %.  ECOG PERFORMANCE STATUS: 1  Physical Exam  Constitutional:  Oriented to person, place, and time and thin appearing male, and in no distress.  HENT:  Head: Normocephalic and atraumatic.  Mouth/Throat: Oropharynx is clear and moist. No oropharyngeal exudate.  Eyes: Conjunctivae are normal. Right eye exhibits no discharge. Left eye exhibits no discharge. No scleral icterus.  Neck: Normal range of motion. Neck supple.  Cardiovascular: Normal rate, regular rhythm, normal heart sounds and intact distal pulses.   Pulmonary/Chest: Effort normal. Quiet breath sounds in all lung fields. No respiratory distress. No wheezes. No rales.  Abdominal: Soft. Bowel sounds are normal. Exhibits no distension and no mass. Mild tenderness in RUQ to deep palpation.   Musculoskeletal: Normal range of motion. Exhibits no edema.  Lymphadenopathy:    No cervical adenopathy.  Neurological: Alert and oriented to person, place, and time. Exhibits muscle wasting. Examined in the wheelchair.  Skin: Skin is warm and dry. No rash noted. Not diaphoretic. No erythema. No pallor.  Psychiatric: Mood, memory and judgment normal.  Vitals reviewed.  LABORATORY DATA: Lab Results  Component Value Date   WBC 5.2 02/12/2021   HGB 12.6 (L) 02/12/2021   HCT 36.2 (L) 02/12/2021   MCV 91.2 02/12/2021   PLT 426 (H) 02/12/2021      Chemistry      Component Value Date/Time   NA 132 (L) 02/05/2021 1135   NA 129 (L) 11/24/2018 1520   K 3.9 02/05/2021 1135   CL 93 (L) 02/05/2021 1135   CO2 22 02/05/2021 1135   BUN 6 (L) 02/05/2021 1135   BUN 8 11/24/2018 1520   CREATININE 0.66 02/05/2021 1135      Component Value Date/Time   CALCIUM 9.5 02/05/2021 1135   ALKPHOS 161 (H) 02/05/2021 1135   AST 16 02/05/2021 1135   ALT 10 02/05/2021 1135   BILITOT 0.8 02/05/2021 1135       RADIOGRAPHIC STUDIES:  No results found.   ASSESSMENT/PLAN:  This is a very pleasant 70 year old Caucasian male diagnosed presently with metastatic non-small cell lung cancer which was initially diagnosed as a  stage IIIa/B (T3, N1/N2, M0) non-small cell lung cancer, adenocarcinoma.  He initially presented with a right upper lobe lung mass with direct extension across the major fissure into the superior segment of the right upper lobe and ipsilateral hilar lymphadenopathy as well as questionable subcarinal lymphadenopathy.  The patient had evidence of metastatic disease confirmed in June 2022.  The patient is positive for a K-ras G12C mutation which was used in the second line setting.  The patient initially underwent treatment with concurrent chemoradiation with weekly carboplatin for AUC of 2 and paclitaxel 45 mg per metered square.  He is status post 7 cycles.  He tolerated treatment well without any concerning adverse side effects except for mild sore throat. And a partial response to treatment.  He then underwent consolidation immunotherapy with Imfinzi 1500 mg IV every 4 weeks.  He is status post 6 cycles.  He tolerated this treatment well but it was discontinued due to evidence  of disease progression.  The patient had developed a suspicious liver lesion concerning for metastatic disease.  This was biopsied under ultrasound guidance and the final pathology was read at [facility] in addition to [facility] which showed no evidence of malignancy and there was suspicious inflammatory process of the biliary origin.  The patient has been on observation but the liver lesion continue to increase in size.  He underwent another ultrasound-guided biopsy of the liver. 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 His molecular studies from the liver biopsy showed no actionable mutations and it is unclear the discrepancy between the previous lung biopsy molecular study as well as the liver biopsy molecular studies except that the new lesion in the liver is consistent with poorly differentiated squamous cell  carcinoma compared to the lung biopsy that was adenocarcinoma at that time.   The patient was then started on second line targeted treatment with Lumakras 960 mg p.o. daily.  He was on this for 1 month but repeat imaging showed evidence of progressive disease in the liver.  Therefore, this was discontinued.  The patient is currently undergoing palliative systemic chemotherapy with carboplatin for an AUC of 5, Alimta 500 mg per metered squared, and Keytruda 20 mg IV every 3 weeks.  He is status post 1 cycle.   Labs reviewed.  Recommend that he proceed cycle #2 today scheduled.  We will see him back for follow-up visit in 3 weeks for evaluation before starting cycle #3.  Discussed his fatigue is likely multifactorial. However, I strongly encouraged him to increase his calorie intake as he is loosing weight rather quickly. Discussed he should start his megace. Of note, he is on Xarelto for a history of atrial fibrillation. Discussed the increased risk for blood clots with megace with him and his wife. I also encouraged him to start drinking supplemental drinks or make his own protein shakes.   The patient was advised to call immediately if he has any concerning symptoms in the interval. The patient voices understanding of current disease status and treatment options and is in agreement with the current care plan. All questions were answered. The patient knows to call the clinic with any problems, questions or concerns. We can certainly see the patient much sooner if necessary      No orders of the defined types were placed in this encounter.    The total time spent in the appointment was 20-29 minutes in this encounter.   Jilliane Kazanjian L Lenox Bink, PA-C 02/12/21

## 2021-02-12 ENCOUNTER — Ambulatory Visit: Payer: Medicare Other | Admitting: Internal Medicine

## 2021-02-12 ENCOUNTER — Inpatient Hospital Stay (HOSPITAL_BASED_OUTPATIENT_CLINIC_OR_DEPARTMENT_OTHER): Payer: Medicare Other | Admitting: Physician Assistant

## 2021-02-12 ENCOUNTER — Other Ambulatory Visit: Payer: Medicare Other

## 2021-02-12 ENCOUNTER — Other Ambulatory Visit: Payer: Self-pay

## 2021-02-12 ENCOUNTER — Inpatient Hospital Stay: Payer: Medicare Other | Admitting: Nutrition

## 2021-02-12 ENCOUNTER — Inpatient Hospital Stay: Payer: Medicare Other

## 2021-02-12 ENCOUNTER — Ambulatory Visit: Payer: Medicare Other

## 2021-02-12 ENCOUNTER — Other Ambulatory Visit: Payer: Self-pay | Admitting: Internal Medicine

## 2021-02-12 ENCOUNTER — Encounter: Payer: Self-pay | Admitting: Physician Assistant

## 2021-02-12 VITALS — BP 115/71

## 2021-02-12 VITALS — HR 57 | Temp 96.3°F | Resp 17 | Wt 128.9 lb

## 2021-02-12 DIAGNOSIS — C3491 Malignant neoplasm of unspecified part of right bronchus or lung: Secondary | ICD-10-CM | POA: Diagnosis not present

## 2021-02-12 DIAGNOSIS — Z5111 Encounter for antineoplastic chemotherapy: Secondary | ICD-10-CM

## 2021-02-12 DIAGNOSIS — C3411 Malignant neoplasm of upper lobe, right bronchus or lung: Secondary | ICD-10-CM | POA: Diagnosis not present

## 2021-02-12 DIAGNOSIS — Z79899 Other long term (current) drug therapy: Secondary | ICD-10-CM | POA: Diagnosis not present

## 2021-02-12 DIAGNOSIS — Z5112 Encounter for antineoplastic immunotherapy: Secondary | ICD-10-CM

## 2021-02-12 DIAGNOSIS — R634 Abnormal weight loss: Secondary | ICD-10-CM

## 2021-02-12 LAB — CBC WITH DIFFERENTIAL (CANCER CENTER ONLY)
Abs Immature Granulocytes: 0.05 10*3/uL (ref 0.00–0.07)
Basophils Absolute: 0 10*3/uL (ref 0.0–0.1)
Basophils Relative: 0 %
Eosinophils Absolute: 0 10*3/uL (ref 0.0–0.5)
Eosinophils Relative: 0 %
HCT: 36.2 % — ABNORMAL LOW (ref 39.0–52.0)
Hemoglobin: 12.6 g/dL — ABNORMAL LOW (ref 13.0–17.0)
Immature Granulocytes: 1 %
Lymphocytes Relative: 9 %
Lymphs Abs: 0.5 10*3/uL — ABNORMAL LOW (ref 0.7–4.0)
MCH: 31.7 pg (ref 26.0–34.0)
MCHC: 34.8 g/dL (ref 30.0–36.0)
MCV: 91.2 fL (ref 80.0–100.0)
Monocytes Absolute: 1.2 10*3/uL — ABNORMAL HIGH (ref 0.1–1.0)
Monocytes Relative: 22 %
Neutro Abs: 3.5 10*3/uL (ref 1.7–7.7)
Neutrophils Relative %: 68 %
Platelet Count: 426 10*3/uL — ABNORMAL HIGH (ref 150–400)
RBC: 3.97 MIL/uL — ABNORMAL LOW (ref 4.22–5.81)
RDW: 14.6 % (ref 11.5–15.5)
WBC Count: 5.2 10*3/uL (ref 4.0–10.5)
nRBC: 0 % (ref 0.0–0.2)

## 2021-02-12 LAB — CMP (CANCER CENTER ONLY)
ALT: <6 U/L (ref 0–44)
AST: 15 U/L (ref 15–41)
Albumin: 3.3 g/dL — ABNORMAL LOW (ref 3.5–5.0)
Alkaline Phosphatase: 159 U/L — ABNORMAL HIGH (ref 38–126)
Anion gap: 14 (ref 5–15)
BUN: 7 mg/dL — ABNORMAL LOW (ref 8–23)
CO2: 21 mmol/L — ABNORMAL LOW (ref 22–32)
Calcium: 9.3 mg/dL (ref 8.9–10.3)
Chloride: 96 mmol/L — ABNORMAL LOW (ref 98–111)
Creatinine: 0.67 mg/dL (ref 0.61–1.24)
GFR, Estimated: >60 mL/min (ref >60)
Glucose, Bld: 99 mg/dL (ref 70–99)
Potassium: 3.6 mmol/L (ref 3.5–5.1)
Sodium: 131 mmol/L — ABNORMAL LOW (ref 135–145)
Total Bilirubin: 0.7 mg/dL (ref 0.3–1.2)
Total Protein: 7.1 g/dL (ref 6.5–8.1)

## 2021-02-12 MED ORDER — SODIUM CHLORIDE 0.9 % IV SOLN
437.5000 mg | Freq: Once | INTRAVENOUS | Status: AC
  Administered 2021-02-12: 440 mg via INTRAVENOUS
  Filled 2021-02-12: qty 44

## 2021-02-12 MED ORDER — SODIUM CHLORIDE 0.9 % IV SOLN
200.0000 mg | Freq: Once | INTRAVENOUS | Status: AC
  Administered 2021-02-12: 200 mg via INTRAVENOUS
  Filled 2021-02-12: qty 8

## 2021-02-12 MED ORDER — SODIUM CHLORIDE 0.9 % IV SOLN
500.0000 mg/m2 | Freq: Once | INTRAVENOUS | Status: AC
  Administered 2021-02-12: 900 mg via INTRAVENOUS
  Filled 2021-02-12: qty 16

## 2021-02-12 MED ORDER — PALONOSETRON HCL INJECTION 0.25 MG/5ML
0.2500 mg | Freq: Once | INTRAVENOUS | Status: AC
  Administered 2021-02-12: 0.25 mg via INTRAVENOUS

## 2021-02-12 MED ORDER — PALONOSETRON HCL INJECTION 0.25 MG/5ML
INTRAVENOUS | Status: AC
  Filled 2021-02-12: qty 5

## 2021-02-12 MED ORDER — SODIUM CHLORIDE 0.9 % IV SOLN
150.0000 mg | Freq: Once | INTRAVENOUS | Status: AC
  Administered 2021-02-12: 150 mg via INTRAVENOUS
  Filled 2021-02-12: qty 150

## 2021-02-12 MED ORDER — SODIUM CHLORIDE 0.9 % IV SOLN
10.0000 mg | Freq: Once | INTRAVENOUS | Status: AC
  Administered 2021-02-12: 10 mg via INTRAVENOUS
  Filled 2021-02-12: qty 10

## 2021-02-12 MED ORDER — SODIUM CHLORIDE 0.9 % IV SOLN: Freq: Once | INTRAVENOUS | Status: AC

## 2021-02-12 NOTE — Progress Notes (Signed)
Patient identified to be at risk for malnutrition on the MST secondary to weight loss and poor appetite.  70 year old male diagnosed with metastatic lung cancer and followed by Dr. Julien Nordmann.  PMH includes A-Fib, Depression, and HTN.  Medications include Folvite, Megace, Remeron, Miralax, and Compazine.  Labs reviewed.  Height:  6\' 2" . Weight: 128.9 pounds. UBW: 165 pounds March 2022. BMI: 16.56. (underweight)  Patient is underweight and has 22% wt loss in less than 6 months which is significant. He has decreased appetite and early satiety. He has not started megace but says his wife will pick up the prescription today. Reports difficulty swallowing but states soft foods with added moisture don't really help. Patient takes miralax for constipation. He has tried oral nutrition supplements and likes them. Reports he has coffee and water first thing in the morning. He has never eaten breakfast. Reports early satiety.  Nutrition Diagnosis:  Severe Malnutrition related to % wt loss and loss of muscle and fat on physical exam.  Intervention: Educated to increase small amounts of food frequently throughout the day.  Try oral nutrition supplements, especially at breakfast since he typically doesn't eat first thing in the morning. Provided samples. Educated on strategies for eating with dysphagia. Provided nutrition fact sheets. Questions answered. Teach back used. Contact information provided.  Monitoring, Evaluation, Goals: Patient will tolerate increased calories and protein to minimize further weight loss.  Next Visit: Wednesday, Sept 28, during infusion with Vinnie Level.

## 2021-02-12 NOTE — Patient Instructions (Signed)
[facility] ONCOLOGY  Discharge Instructions: Thank you for choosing [facility] to provide your oncology and hematology care.   If you have a lab appointment with the [facility], please go directly to the [facility] and check in at the registration area.   Wear comfortable clothing and clothing appropriate for easy access to any Portacath or PICC line.   We strive to give you quality time with your provider. You may need to reschedule your appointment if you arrive late (15 or more minutes).  Arriving late affects you and other patients whose appointments are after yours.  Also, if you miss three or more appointments without notifying the office, you may be dismissed from the clinic at the provider's discretion.      For prescription refill requests, have your pharmacy contact our office and allow 72 hours for refills to be completed.    Today you received the following chemotherapy and/or immunotherapy agents: Pembrolizumab (Keytruda), Pemetrexed, and Carboplatin.     To help prevent nausea and vomiting after your treatment, we encourage you to take your nausea medication as directed.  BELOW ARE SYMPTOMS THAT SHOULD BE REPORTED IMMEDIATELY: *FEVER GREATER THAN 100.4 F (38 C) OR HIGHER *CHILLS OR SWEATING *NAUSEA AND VOMITING THAT IS NOT CONTROLLED WITH YOUR NAUSEA MEDICATION *UNUSUAL SHORTNESS OF BREATH *UNUSUAL BRUISING OR BLEEDING *URINARY PROBLEMS (pain or burning when urinating, or frequent urination) *BOWEL PROBLEMS (unusual diarrhea, constipation, pain near the anus) TENDERNESS IN MOUTH AND THROAT WITH OR WITHOUT PRESENCE OF ULCERS (sore throat, sores in mouth, or a toothache) UNUSUAL RASH, SWELLING OR PAIN  UNUSUAL VAGINAL DISCHARGE OR ITCHING   Items with * indicate a potential emergency and should be followed up as soon as possible or go to the Emergency Department if any problems should occur.  Please show the CHEMOTHERAPY ALERT CARD  or IMMUNOTHERAPY ALERT CARD at check-in to the Emergency Department and triage nurse.  Should you have questions after your visit or need to cancel or reschedule your appointment, please contact [facility]  Dept: [phone]  and follow the prompts.  Office hours are 8:00 a.m. to 4:30 p.m. Monday - Friday. Please note that voicemails left after 4:00 p.m. may not be returned until the following business day.  We are closed weekends and major holidays. You have access to a nurse at all times for urgent questions. Please call the main number to the clinic Dept: [phone] and follow the prompts.   For any non-urgent questions, you may also contact your provider using MyChart. We now offer e-Visits for anyone 38 and older to request care online for non-urgent symptoms. For details visit [email or web address].   Also download the MyChart app! Go to the app store, search "MyChart", open the app, select [facility], and log in with your MyChart username and password.  Due to Covid, a mask is required upon entering the hospital/clinic. If you do not have a mask, one will be given to you upon arrival. For doctor visits, patients may have 1 support person aged 53 or older with them. For treatment visits, patients cannot have anyone with them due to current Covid guidelines and our immunocompromised population.

## 2021-02-18 ENCOUNTER — Other Ambulatory Visit: Payer: Self-pay | Admitting: Medical Oncology

## 2021-02-18 ENCOUNTER — Telehealth: Payer: Self-pay | Admitting: Medical Oncology

## 2021-02-18 DIAGNOSIS — C3491 Malignant neoplasm of unspecified part of right bronchus or lung: Secondary | ICD-10-CM

## 2021-02-18 NOTE — Telephone Encounter (Signed)
I tried to return Malinda's call that "Ron thinks his condition  is progressing".  I called her back and there was no answer.

## 2021-02-19 ENCOUNTER — Other Ambulatory Visit: Payer: Self-pay

## 2021-02-19 ENCOUNTER — Inpatient Hospital Stay: Payer: Medicare Other

## 2021-02-19 ENCOUNTER — Other Ambulatory Visit: Payer: Medicare Other

## 2021-02-19 DIAGNOSIS — C3491 Malignant neoplasm of unspecified part of right bronchus or lung: Secondary | ICD-10-CM

## 2021-02-19 DIAGNOSIS — Z5111 Encounter for antineoplastic chemotherapy: Secondary | ICD-10-CM | POA: Diagnosis not present

## 2021-02-19 LAB — CBC WITH DIFFERENTIAL (CANCER CENTER ONLY)
Abs Immature Granulocytes: 0.02 10*3/uL (ref 0.00–0.07)
Basophils Absolute: 0 10*3/uL (ref 0.0–0.1)
Basophils Relative: 1 %
Eosinophils Absolute: 0 10*3/uL (ref 0.0–0.5)
Eosinophils Relative: 0 %
HCT: 34.2 % — ABNORMAL LOW (ref 39.0–52.0)
Hemoglobin: 12 g/dL — ABNORMAL LOW (ref 13.0–17.0)
Immature Granulocytes: 1 %
Lymphocytes Relative: 13 %
Lymphs Abs: 0.4 10*3/uL — ABNORMAL LOW (ref 0.7–4.0)
MCH: 31.3 pg (ref 26.0–34.0)
MCHC: 35.1 g/dL (ref 30.0–36.0)
MCV: 89.1 fL (ref 80.0–100.0)
Monocytes Absolute: 0.2 10*3/uL (ref 0.1–1.0)
Monocytes Relative: 7 %
Neutro Abs: 2.2 10*3/uL (ref 1.7–7.7)
Neutrophils Relative %: 78 %
Platelet Count: 165 10*3/uL (ref 150–400)
RBC: 3.84 MIL/uL — ABNORMAL LOW (ref 4.22–5.81)
RDW: 14.6 % (ref 11.5–15.5)
WBC Count: 2.8 10*3/uL — ABNORMAL LOW (ref 4.0–10.5)
nRBC: 0 % (ref 0.0–0.2)

## 2021-02-19 LAB — CMP (CANCER CENTER ONLY)
ALT: 7 U/L (ref 0–44)
AST: 16 U/L (ref 15–41)
Albumin: 3.2 g/dL — ABNORMAL LOW (ref 3.5–5.0)
Alkaline Phosphatase: 140 U/L — ABNORMAL HIGH (ref 38–126)
Anion gap: 15 (ref 5–15)
BUN: 9 mg/dL (ref 8–23)
CO2: 21 mmol/L — ABNORMAL LOW (ref 22–32)
Calcium: 9.2 mg/dL (ref 8.9–10.3)
Chloride: 95 mmol/L — ABNORMAL LOW (ref 98–111)
Creatinine: 0.64 mg/dL (ref 0.61–1.24)
GFR, Estimated: >60 mL/min (ref >60)
Glucose, Bld: 99 mg/dL (ref 70–99)
Potassium: 3.5 mmol/L (ref 3.5–5.1)
Sodium: 131 mmol/L — ABNORMAL LOW (ref 135–145)
Total Bilirubin: 1.1 mg/dL (ref 0.3–1.2)
Total Protein: 6.9 g/dL (ref 6.5–8.1)

## 2021-02-26 ENCOUNTER — Inpatient Hospital Stay: Payer: Medicare Other

## 2021-02-26 ENCOUNTER — Other Ambulatory Visit: Payer: Medicare Other

## 2021-02-26 ENCOUNTER — Telehealth: Payer: Self-pay

## 2021-02-26 NOTE — Telephone Encounter (Signed)
Pts wife called stating pt has had diarrhea for 24hrs and doesn't seem to have control of it right now. She denies, SOB, fever, CP, chills. Wife was advised to be sure the pt continues to hydrate and can take Imodium. She has also been advised to have him COVID tested and if any changes or the imodium does not help, please call back.

## 2021-02-28 ENCOUNTER — Other Ambulatory Visit: Payer: Self-pay

## 2021-02-28 ENCOUNTER — Emergency Department (HOSPITAL_COMMUNITY): Payer: Medicare Other

## 2021-02-28 ENCOUNTER — Inpatient Hospital Stay (HOSPITAL_COMMUNITY)
Admission: EM | Admit: 2021-02-28 | Discharge: 2021-03-03 | DRG: 189 | Disposition: A | Discharge status: Home / Self Care | Payer: Medicare Other | Attending: Pulmonary Disease | Admitting: Pulmonary Disease

## 2021-02-28 ENCOUNTER — Encounter (HOSPITAL_COMMUNITY): Payer: Self-pay

## 2021-02-28 DIAGNOSIS — R64 Cachexia: Secondary | ICD-10-CM | POA: Diagnosis not present

## 2021-02-28 DIAGNOSIS — Z720 Tobacco use: Secondary | ICD-10-CM | POA: Diagnosis present

## 2021-02-28 DIAGNOSIS — R300 Dysuria: Secondary | ICD-10-CM | POA: Diagnosis not present

## 2021-02-28 DIAGNOSIS — G319 Degenerative disease of nervous system, unspecified: Secondary | ICD-10-CM | POA: Diagnosis not present

## 2021-02-28 DIAGNOSIS — D84821 Immunodeficiency due to drugs: Secondary | ICD-10-CM | POA: Diagnosis not present

## 2021-02-28 DIAGNOSIS — E860 Dehydration: Secondary | ICD-10-CM | POA: Diagnosis present

## 2021-02-28 DIAGNOSIS — I10 Essential (primary) hypertension: Secondary | ICD-10-CM | POA: Diagnosis not present

## 2021-02-28 DIAGNOSIS — C787 Secondary malignant neoplasm of liver and intrahepatic bile duct: Secondary | ICD-10-CM | POA: Diagnosis present

## 2021-02-28 DIAGNOSIS — R451 Restlessness and agitation: Secondary | ICD-10-CM | POA: Diagnosis not present

## 2021-02-28 DIAGNOSIS — J189 Pneumonia, unspecified organism: Secondary | ICD-10-CM | POA: Diagnosis not present

## 2021-02-28 DIAGNOSIS — J90 Pleural effusion, not elsewhere classified: Secondary | ICD-10-CM | POA: Diagnosis not present

## 2021-02-28 DIAGNOSIS — Z820 Family history of epilepsy and other diseases of the nervous system: Secondary | ICD-10-CM

## 2021-02-28 DIAGNOSIS — Z7189 Other specified counseling: Secondary | ICD-10-CM | POA: Diagnosis not present

## 2021-02-28 DIAGNOSIS — I4819 Other persistent atrial fibrillation: Secondary | ICD-10-CM | POA: Diagnosis present

## 2021-02-28 DIAGNOSIS — R911 Solitary pulmonary nodule: Secondary | ICD-10-CM | POA: Diagnosis not present

## 2021-02-28 DIAGNOSIS — Z66 Do not resuscitate: Secondary | ICD-10-CM | POA: Diagnosis not present

## 2021-02-28 DIAGNOSIS — R197 Diarrhea, unspecified: Secondary | ICD-10-CM | POA: Diagnosis present

## 2021-02-28 DIAGNOSIS — J440 Chronic obstructive pulmonary disease with acute lower respiratory infection: Secondary | ICD-10-CM | POA: Diagnosis present

## 2021-02-28 DIAGNOSIS — Z79899 Other long term (current) drug therapy: Secondary | ICD-10-CM

## 2021-02-28 DIAGNOSIS — G9349 Other encephalopathy: Secondary | ICD-10-CM | POA: Diagnosis present

## 2021-02-28 DIAGNOSIS — Z515 Encounter for palliative care: Secondary | ICD-10-CM | POA: Diagnosis not present

## 2021-02-28 DIAGNOSIS — R0902 Hypoxemia: Secondary | ICD-10-CM | POA: Diagnosis not present

## 2021-02-28 DIAGNOSIS — Z20822 Contact with and (suspected) exposure to covid-19: Secondary | ICD-10-CM | POA: Diagnosis present

## 2021-02-28 DIAGNOSIS — R0689 Other abnormalities of breathing: Secondary | ICD-10-CM | POA: Diagnosis not present

## 2021-02-28 DIAGNOSIS — E876 Hypokalemia: Secondary | ICD-10-CM | POA: Diagnosis present

## 2021-02-28 DIAGNOSIS — R338 Other retention of urine: Secondary | ICD-10-CM | POA: Diagnosis not present

## 2021-02-28 DIAGNOSIS — R1310 Dysphagia, unspecified: Secondary | ICD-10-CM | POA: Diagnosis present

## 2021-02-28 DIAGNOSIS — C3411 Malignant neoplasm of upper lobe, right bronchus or lung: Secondary | ICD-10-CM | POA: Diagnosis present

## 2021-02-28 DIAGNOSIS — Z832 Family history of diseases of the blood and blood-forming organs and certain disorders involving the immune mechanism: Secondary | ICD-10-CM

## 2021-02-28 DIAGNOSIS — I959 Hypotension, unspecified: Secondary | ICD-10-CM | POA: Diagnosis not present

## 2021-02-28 DIAGNOSIS — R6520 Severe sepsis without septic shock: Secondary | ICD-10-CM | POA: Diagnosis not present

## 2021-02-28 DIAGNOSIS — A419 Sepsis, unspecified organism: Secondary | ICD-10-CM

## 2021-02-28 DIAGNOSIS — G893 Neoplasm related pain (acute) (chronic): Secondary | ICD-10-CM | POA: Diagnosis present

## 2021-02-28 DIAGNOSIS — D701 Agranulocytosis secondary to cancer chemotherapy: Secondary | ICD-10-CM | POA: Diagnosis present

## 2021-02-28 DIAGNOSIS — J929 Pleural plaque without asbestos: Secondary | ICD-10-CM | POA: Diagnosis not present

## 2021-02-28 DIAGNOSIS — T451X5A Adverse effect of antineoplastic and immunosuppressive drugs, initial encounter: Secondary | ICD-10-CM | POA: Diagnosis not present

## 2021-02-28 DIAGNOSIS — I471 Supraventricular tachycardia: Secondary | ICD-10-CM | POA: Diagnosis not present

## 2021-02-28 DIAGNOSIS — D649 Anemia, unspecified: Secondary | ICD-10-CM | POA: Diagnosis not present

## 2021-02-28 DIAGNOSIS — Z85118 Personal history of other malignant neoplasm of bronchus and lung: Secondary | ICD-10-CM | POA: Diagnosis not present

## 2021-02-28 DIAGNOSIS — J9601 Acute respiratory failure with hypoxia: Principal | ICD-10-CM | POA: Diagnosis present

## 2021-02-28 DIAGNOSIS — Z681 Body mass index (BMI) 19 or less, adult: Secondary | ICD-10-CM | POA: Diagnosis not present

## 2021-02-28 DIAGNOSIS — N401 Enlarged prostate with lower urinary tract symptoms: Secondary | ICD-10-CM | POA: Diagnosis not present

## 2021-02-28 DIAGNOSIS — C3491 Malignant neoplasm of unspecified part of right bronchus or lung: Secondary | ICD-10-CM | POA: Diagnosis not present

## 2021-02-28 DIAGNOSIS — F419 Anxiety disorder, unspecified: Secondary | ICD-10-CM | POA: Diagnosis present

## 2021-02-28 DIAGNOSIS — R4182 Altered mental status, unspecified: Secondary | ICD-10-CM | POA: Diagnosis not present

## 2021-02-28 DIAGNOSIS — R918 Other nonspecific abnormal finding of lung field: Secondary | ICD-10-CM | POA: Diagnosis not present

## 2021-02-28 DIAGNOSIS — F32A Depression, unspecified: Secondary | ICD-10-CM | POA: Diagnosis present

## 2021-02-28 DIAGNOSIS — Z87891 Personal history of nicotine dependence: Secondary | ICD-10-CM

## 2021-02-28 DIAGNOSIS — Z8249 Family history of ischemic heart disease and other diseases of the circulatory system: Secondary | ICD-10-CM

## 2021-02-28 DIAGNOSIS — I725 Aneurysm of other precerebral arteries: Secondary | ICD-10-CM | POA: Diagnosis not present

## 2021-02-28 DIAGNOSIS — Z7901 Long term (current) use of anticoagulants: Secondary | ICD-10-CM

## 2021-02-28 DIAGNOSIS — Z823 Family history of stroke: Secondary | ICD-10-CM

## 2021-02-28 LAB — CBC WITH DIFFERENTIAL/PLATELET
Abs Immature Granulocytes: 0.02 10*3/uL (ref 0.00–0.07)
Basophils Absolute: 0 10*3/uL (ref 0.0–0.1)
Basophils Relative: 0 %
Eosinophils Absolute: 0 10*3/uL (ref 0.0–0.5)
Eosinophils Relative: 0 %
HCT: 28 % — ABNORMAL LOW (ref 39.0–52.0)
Hemoglobin: 9.8 g/dL — ABNORMAL LOW (ref 13.0–17.0)
Immature Granulocytes: 1 %
Lymphocytes Relative: 19 %
Lymphs Abs: 0.4 10*3/uL — ABNORMAL LOW (ref 0.7–4.0)
MCH: 31.2 pg (ref 26.0–34.0)
MCHC: 35 g/dL (ref 30.0–36.0)
MCV: 89.2 fL (ref 80.0–100.0)
Monocytes Absolute: 0.5 10*3/uL (ref 0.1–1.0)
Monocytes Relative: 27 %
Neutro Abs: 1.1 10*3/uL — ABNORMAL LOW (ref 1.7–7.7)
Neutrophils Relative %: 53 %
Platelets: 162 10*3/uL (ref 150–400)
RBC: 3.14 MIL/uL — ABNORMAL LOW (ref 4.22–5.81)
RDW: 14.5 % (ref 11.5–15.5)
WBC: 2 10*3/uL — ABNORMAL LOW (ref 4.0–10.5)
nRBC: 0 % (ref 0.0–0.2)

## 2021-02-28 LAB — BLOOD GAS, ARTERIAL
Acid-base deficit: 2.1 mmol/L — ABNORMAL HIGH (ref 0.0–2.0)
Bicarbonate: 17.6 mmol/L — ABNORMAL LOW (ref 20.0–28.0)
Drawn by: 270211
O2 Saturation: 100 %
Patient temperature: 98.6
pCO2 arterial: 16.2 mmHg — CL (ref 32.0–48.0)
pH, Arterial: 7.637 (ref 7.350–7.450)
pO2, Arterial: 257 mmHg — ABNORMAL HIGH (ref 83.0–108.0)

## 2021-02-28 LAB — LACTIC ACID, PLASMA: Lactic Acid, Venous: 3.8 mmol/L (ref 0.5–1.9)

## 2021-02-28 LAB — PROTIME-INR
INR: 1.2 (ref 0.8–1.2)
Prothrombin Time: 15.3 seconds — ABNORMAL HIGH (ref 11.4–15.2)

## 2021-02-28 LAB — COMPREHENSIVE METABOLIC PANEL
ALT: 10 U/L (ref 0–44)
AST: 21 U/L (ref 15–41)
Albumin: 3.4 g/dL — ABNORMAL LOW (ref 3.5–5.0)
Alkaline Phosphatase: 107 U/L (ref 38–126)
Anion gap: 16 — ABNORMAL HIGH (ref 5–15)
BUN: 8 mg/dL (ref 8–23)
CO2: 21 mmol/L — ABNORMAL LOW (ref 22–32)
Calcium: 9.4 mg/dL (ref 8.9–10.3)
Chloride: 100 mmol/L (ref 98–111)
Creatinine, Ser: 0.55 mg/dL — ABNORMAL LOW (ref 0.61–1.24)
GFR, Estimated: >60 mL/min (ref >60)
Glucose, Bld: 115 mg/dL — ABNORMAL HIGH (ref 70–99)
Potassium: 3 mmol/L — ABNORMAL LOW (ref 3.5–5.1)
Sodium: 137 mmol/L (ref 135–145)
Total Bilirubin: 1.2 mg/dL (ref 0.3–1.2)
Total Protein: 6.8 g/dL (ref 6.5–8.1)

## 2021-02-28 LAB — BRAIN NATRIURETIC PEPTIDE: B Natriuretic Peptide: 69.6 pg/mL (ref 0.0–100.0)

## 2021-02-28 LAB — TROPONIN I (HIGH SENSITIVITY)
Troponin I (High Sensitivity): 9 ng/L (ref <18)
Troponin I (High Sensitivity): 9 ng/L (ref <18)

## 2021-02-28 LAB — D-DIMER, QUANTITATIVE: D-Dimer, Quant: 1.78 ug/mL-FEU — ABNORMAL HIGH (ref 0.00–0.50)

## 2021-02-28 LAB — RESP PANEL BY RT-PCR (FLU A&B, COVID) ARPGX2
Influenza A by PCR: NEGATIVE
Influenza B by PCR: NEGATIVE
SARS Coronavirus 2 by RT PCR: NEGATIVE

## 2021-02-28 LAB — APTT: aPTT: 28 seconds (ref 24–36)

## 2021-02-28 IMAGING — DX DG CHEST 1V PORT
2 series · 2 of 2 positions shown · non-contrast
Comparison: [DATE] [DATE], [DATE], [DATE] [DATE], [DATE]

CLINICAL DATA: Questionable sepsis - evaluate for abnormality
history lung cancer

EXAM:
PORTABLE CHEST 1 VIEW

[chest ap (1 of 2)]
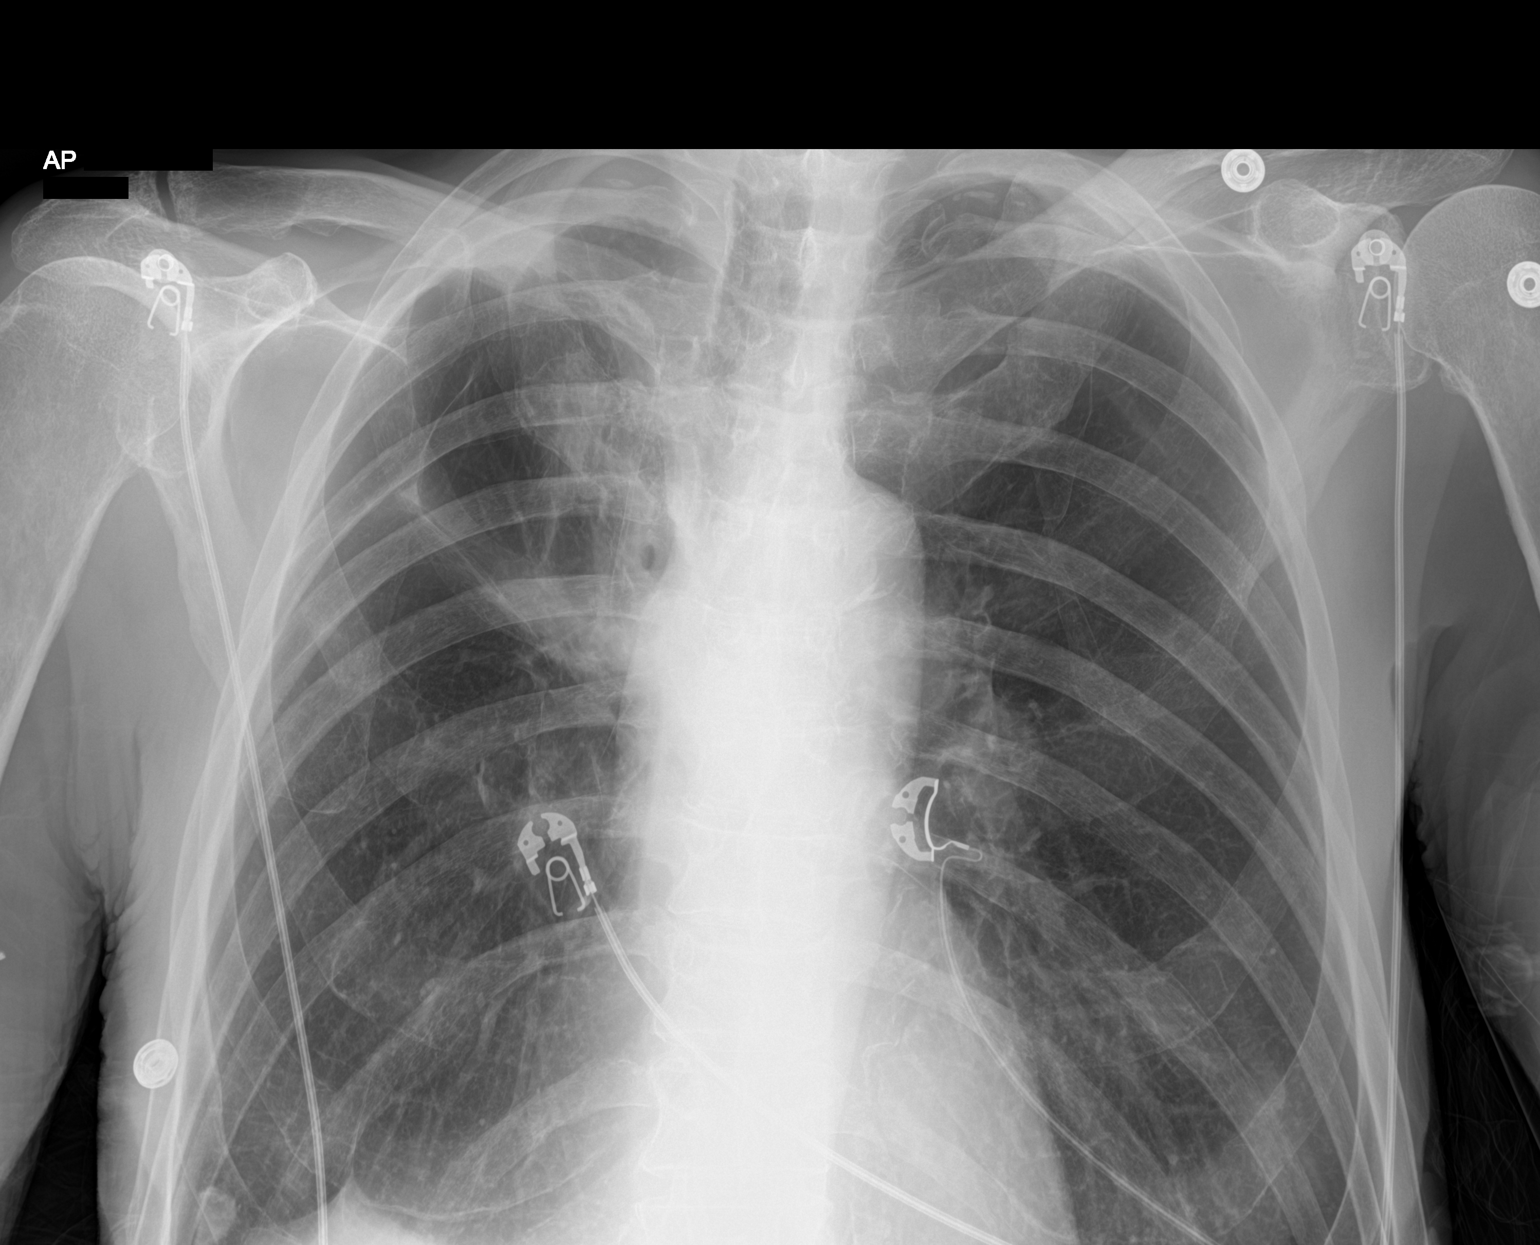

[chest ap (2 of 2)]
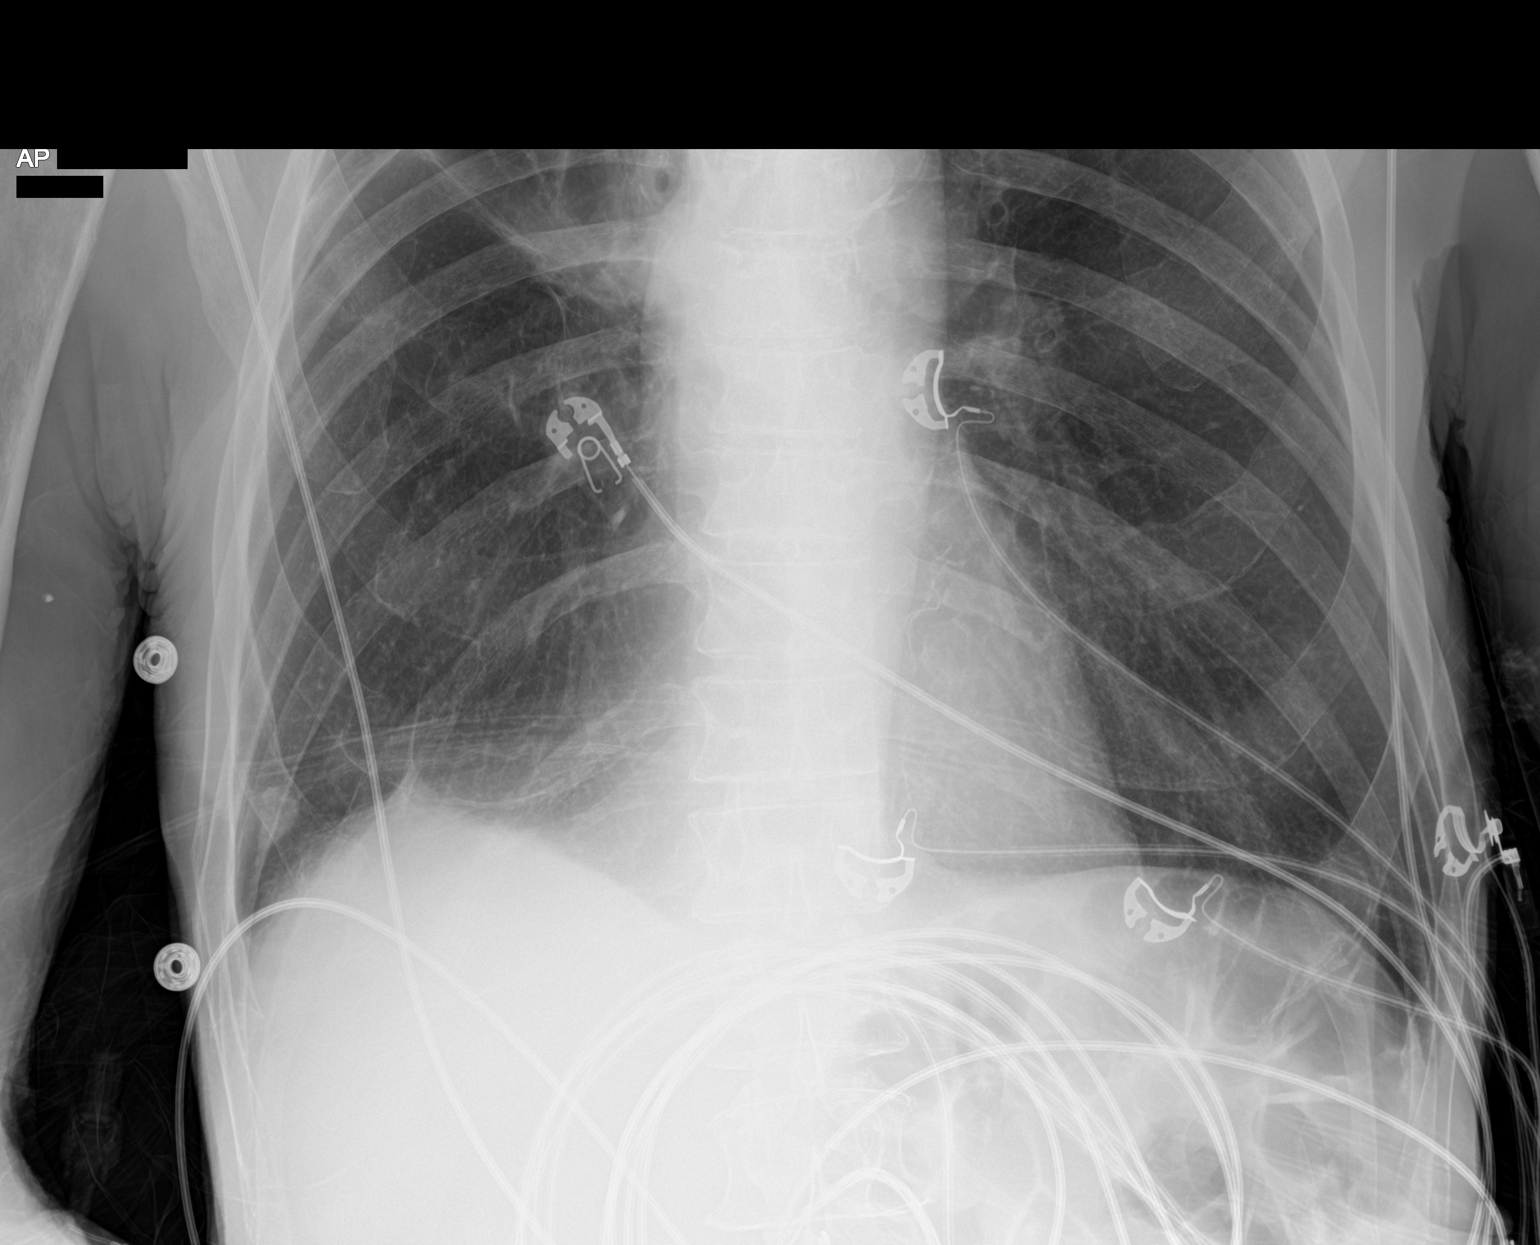

[2 of 2 positions shown; findings below may reference images not displayed]

FINDINGS: The cardiomediastinal silhouette is unchanged in
contour.Revisualization an irregular contour of the RIGHT upper lung
along the mediastinal border, similar in comparison to prior.
Asymmetric RIGHT apical pleural thickening, unchanged nodular
opacity overlying the RIGHT costophrenic angle, unchanged and
consistent with a calcified granuloma. No pleural effusion. No
pneumothorax. No acute pleuroparenchymal abnormality. Visualized
abdomen is unremarkable.
IMPRESSION: No new focal consolidation.

## 2021-02-28 IMAGING — CT CT ANGIO CHEST
2 of 7 series · 18 of 37 positions shown · IV contrast (OMNIPAQUE 350)
Comparison: Chest x-ray [DATE], CT chest [DATE],
[DATE], [DATE], PET CT [DATE]

CLINICAL DATA: History of lung cancer hypoxia altered

EXAM:
CT ANGIOGRAPHY CHEST WITH CONTRAST
TECHNIQUE: Multidetector CT imaging of the chest was performed using the
standard protocol during bolus administration of intravenous
contrast. Multiplanar CT image reconstructions and MIPs were
obtained to evaluate the vascular anatomy.
CONTRAST:  80mL OMNIPAQUE IOHEXOL 350 MG/ML SOLN

[Series 5: thins · axial · 0.71mm/px · z∈[+944,+1236]mm · 17 of 330 slices shown]
[im 19/330  lung]
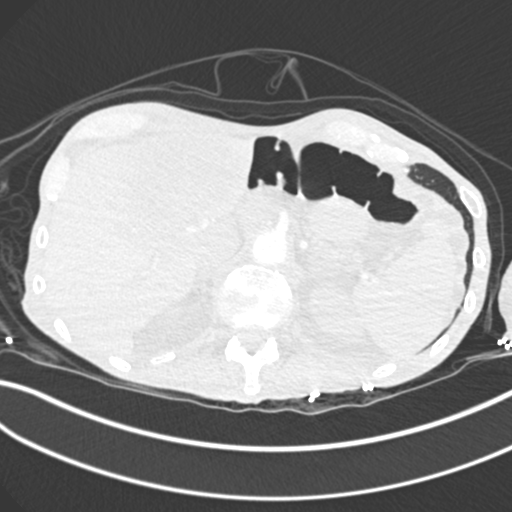
[im 37/330  mediastinal]
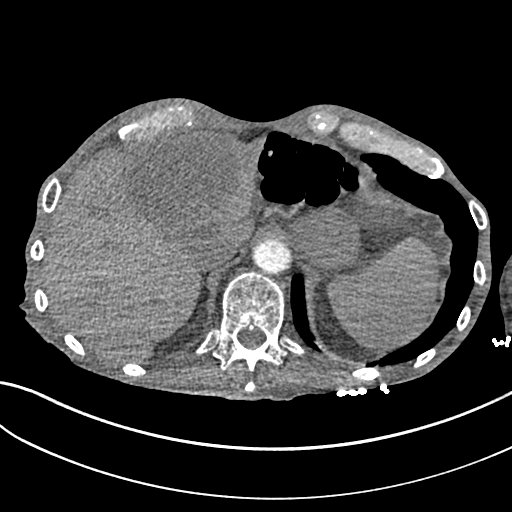
[im 55/330  lung]
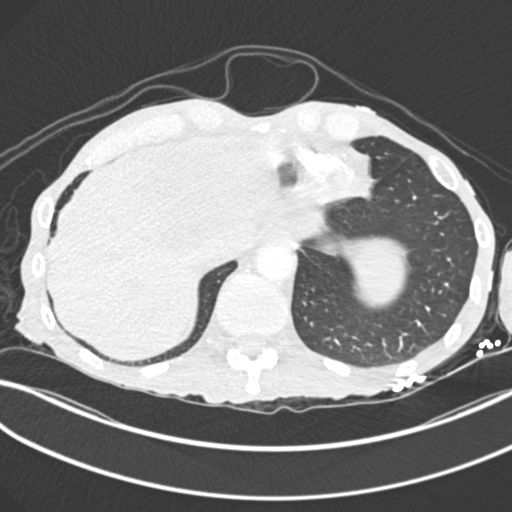
[im 74/330  mediastinal]
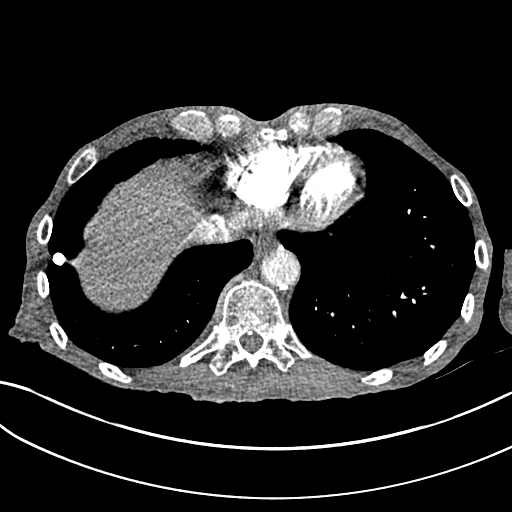
[im 92/330  lung]
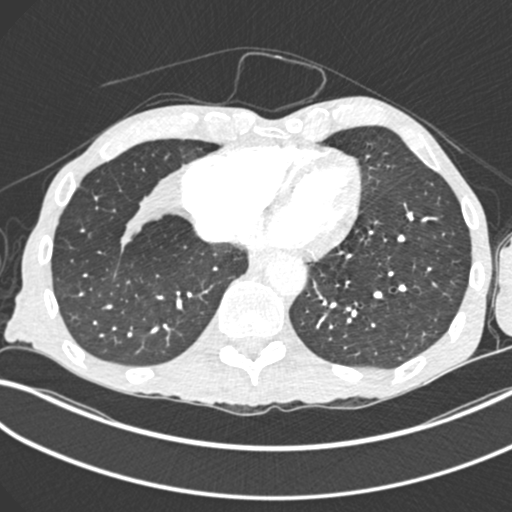
[im 110/330  mediastinal]
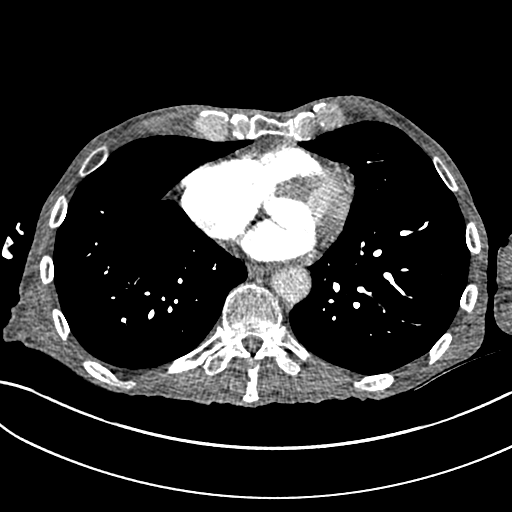
[im 128/330  lung]
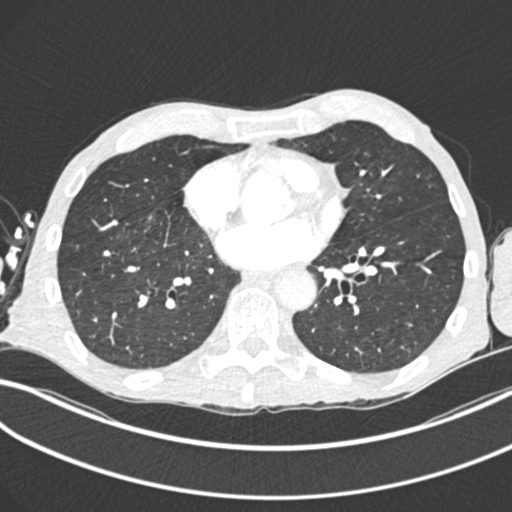
[im 147/330  mediastinal]
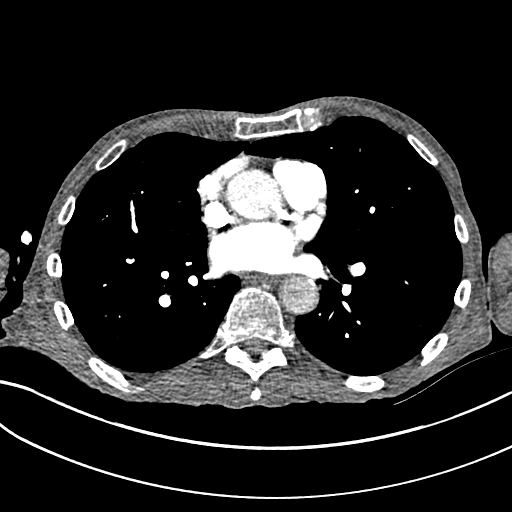
[im 165/330  lung]
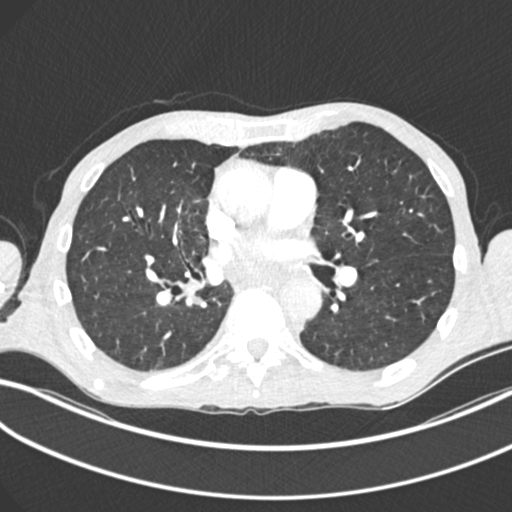
[im 183/330  mediastinal]
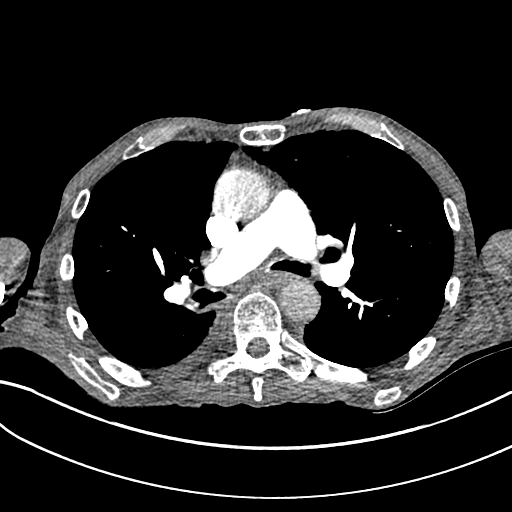
[im 202/330  lung]
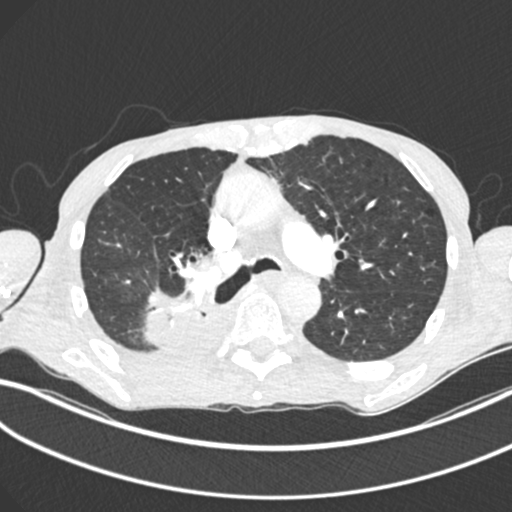
[im 220/330  mediastinal]
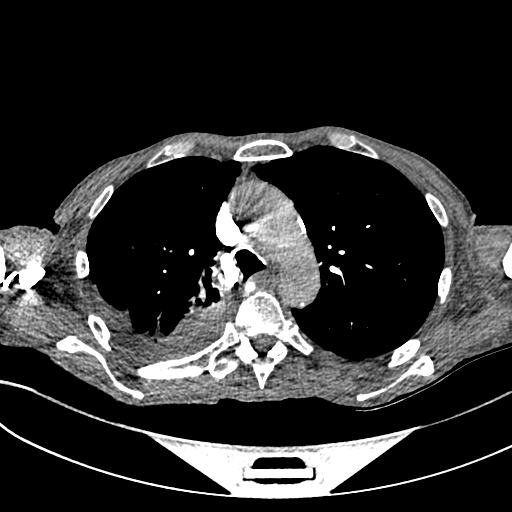
[im 238/330  lung]
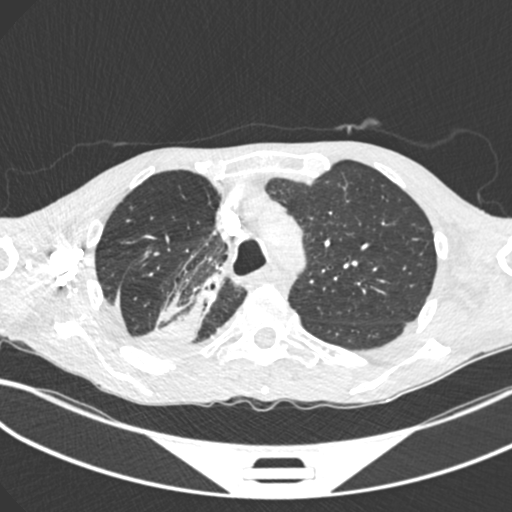
[im 256/330  mediastinal]
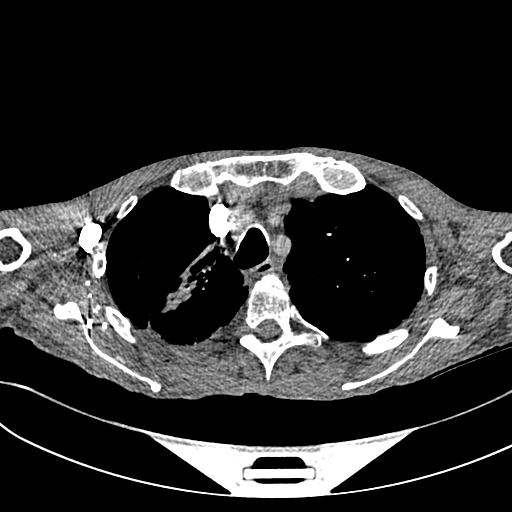
[im 275/330  lung]
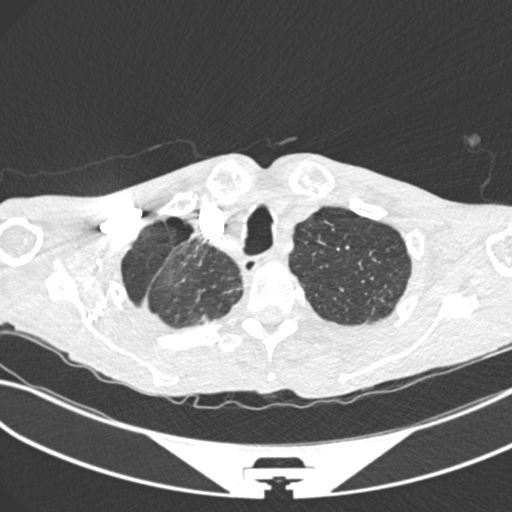
[im 293/330  mediastinal]
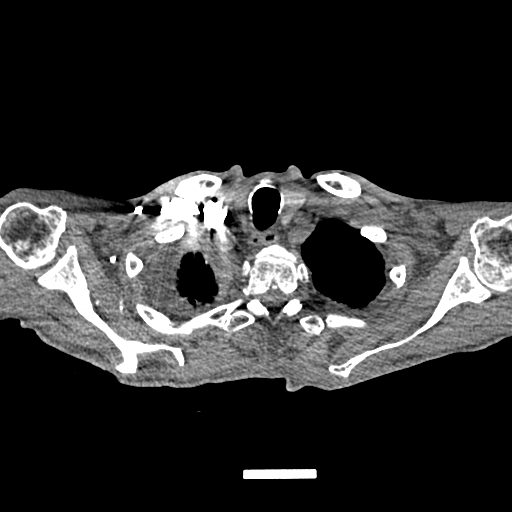
[im 311/330  lung]
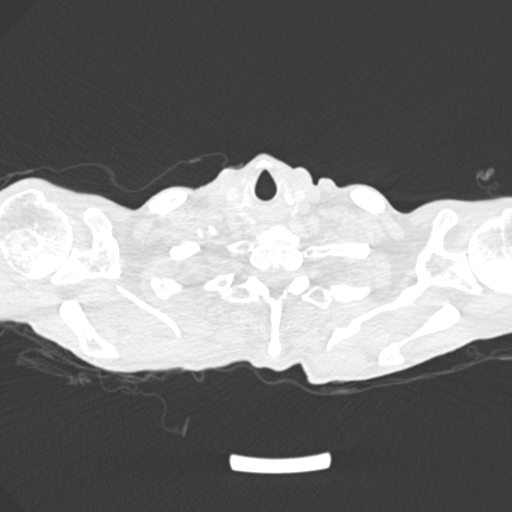

[Series 6: coronal mpr · coronal · 0.65mm/px · 1 of 117 slices shown]
[im 59/117  mediastinal]
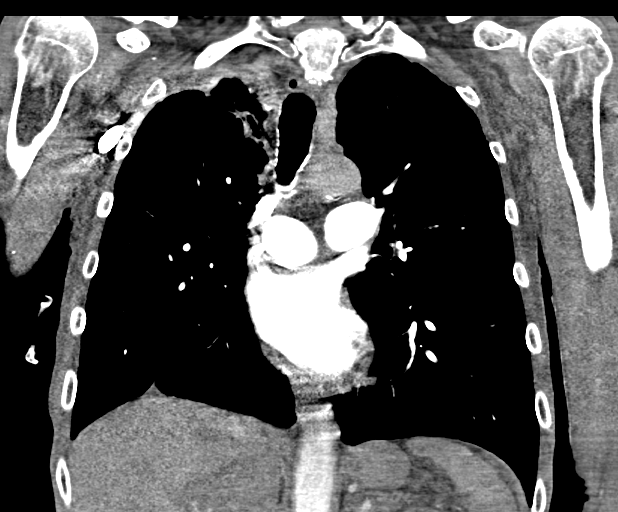

[18 of 37 positions shown; findings below may reference images not displayed]

FINDINGS: Cardiovascular: Satisfactory opacification of the pulmonary arteries
to the segmental level. No evidence of pulmonary embolism. Moderate
aortic atherosclerosis. No aneurysm. Coronary vascular
calcification. Normal cardiac size. No pericardial effusion

Mediastinum/Nodes: Midline trachea. No thyroid. No increasing
adenopathy. Esophagus within normal limits.

Lungs/Pleura: Small right-sided pleural effusion with loculation at
the apex is stable to minimally increased in size. Bandlike
consolidation in the right upper lobe and superior segment of right
lower lobe grossly similar in appearance. 4 mm left upper lobe lung
nodule, series 4, image 58, previously 4 mm. Stable densely
calcified nodule in the right lung base. Stable 3 mm left posterior
subpleural lung nodule series 4, image 18. Stable 3 mm right
subpleural lower lobe lung nodule, series 4, image 115.

Upper Abdomen: Incompletely visualized large hepatic metastatic
lesion in the left hepatic lobe.

Musculoskeletal: No acute or suspicious osseous abnormality

Review of the MIP images confirms the above findings.
IMPRESSION: 1. No acute pulmonary embolus is seen.
2. Similar consolidation and bandlike density in the right upper
lobe and superior segment of right lower lobe. Small right-sided
pleural effusion with loculation superiorly, this is stable to
minimally increased in size. Other punctate bilateral pulmonary
nodules are unchanged.
3. Incompletely visualized large metastatic lesion in the left
hepatic lobe

Aortic Atherosclerosis ([L7]-[L7]).

## 2021-02-28 IMAGING — CT CT HEAD WO/W CM
3 of 5 series · 16 of 47 positions shown, 19 images · IV contrast (omnipaque)
Comparison: MRI [DATE]

CLINICAL DATA: History of lung cancer, altered

EXAM:
CT HEAD WITHOUT AND WITH CONTRAST
TECHNIQUE: Contiguous axial images were obtained from the base of the skull
through the vertex without and with intravenous contrast
CONTRAST:  80mL OMNIPAQUE IOHEXOL 350 MG/ML SOLN

[Series 7: axial st · axial · 0.71mm/px · z∈[+943,+1237]mm · 10 of 165 slices shown, 13 images]
[im 9/165  brain]
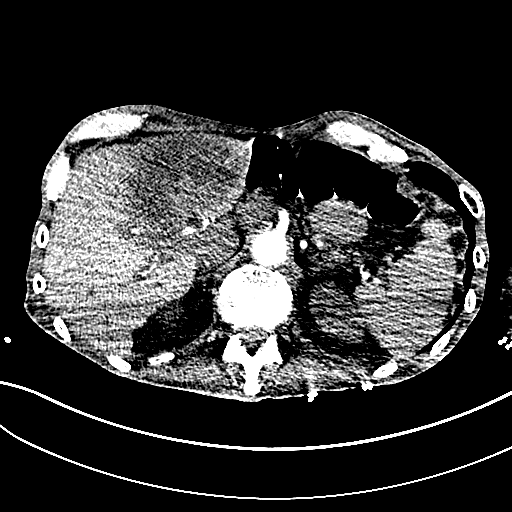
[im 9/165  bone]
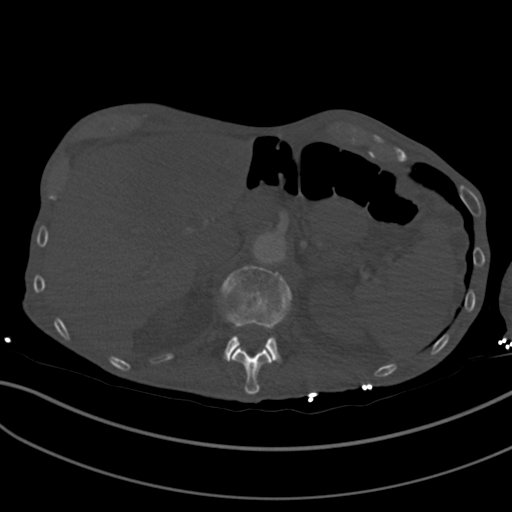
[im 25/165  brain]
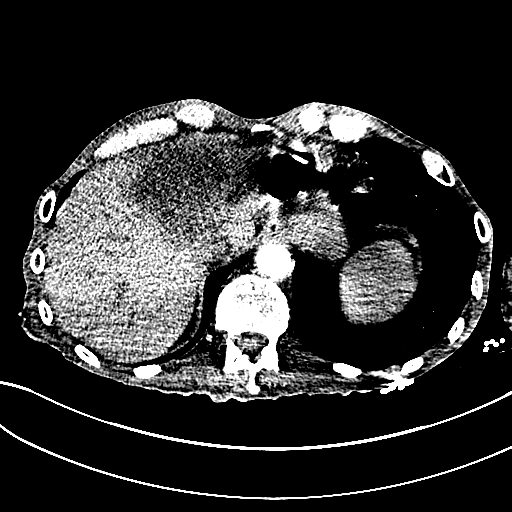
[im 42/165  brain]
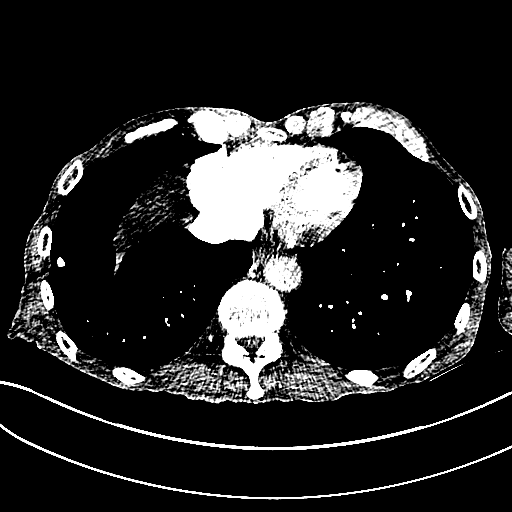
[im 58/165  brain]
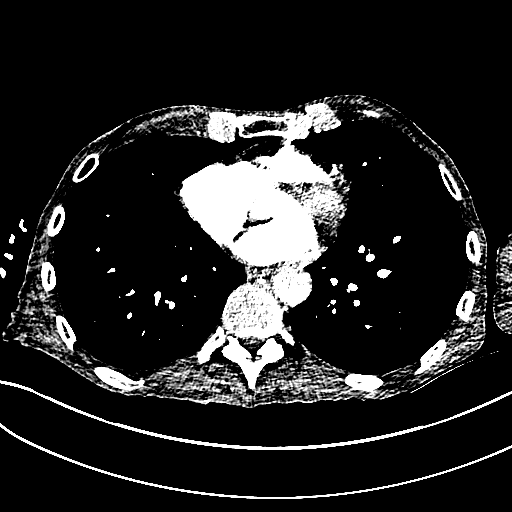
[im 74/165  brain]
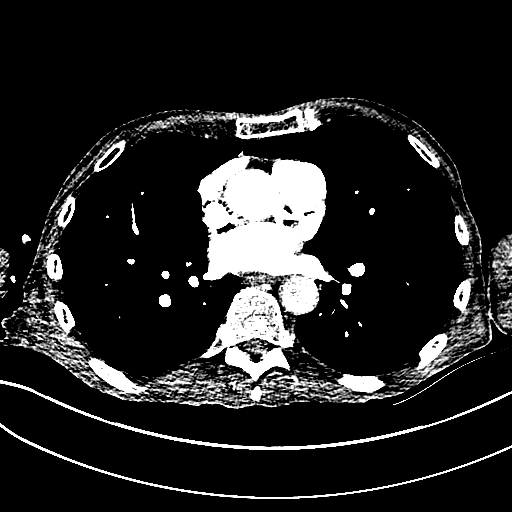
[im 74/165  bone]
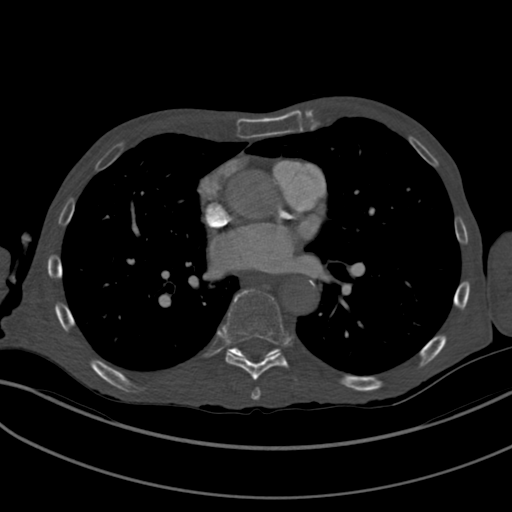
[im 91/165  brain]
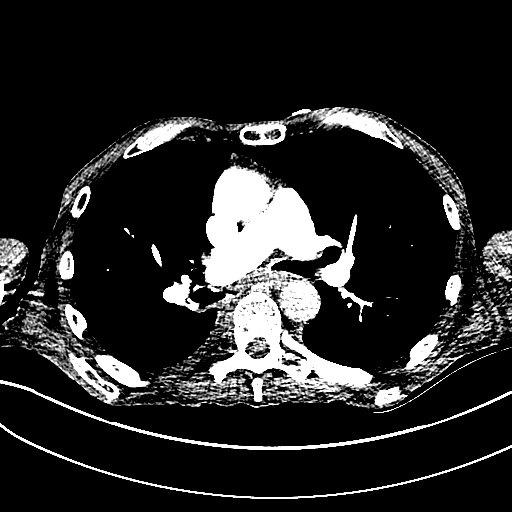
[im 107/165  brain]
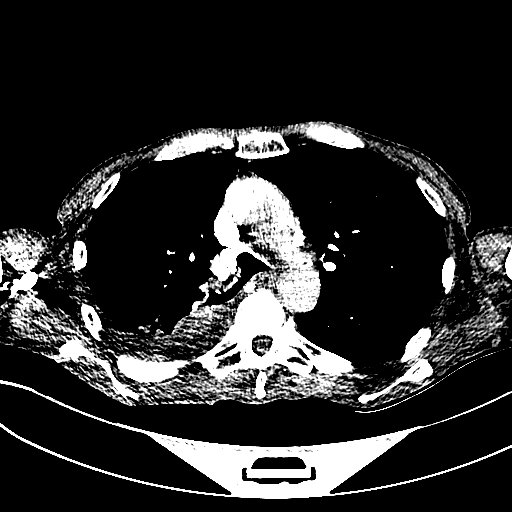
[im 123/165  brain]
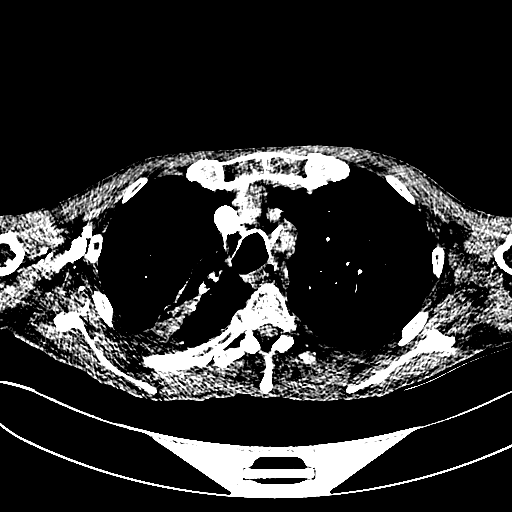
[im 140/165  brain]
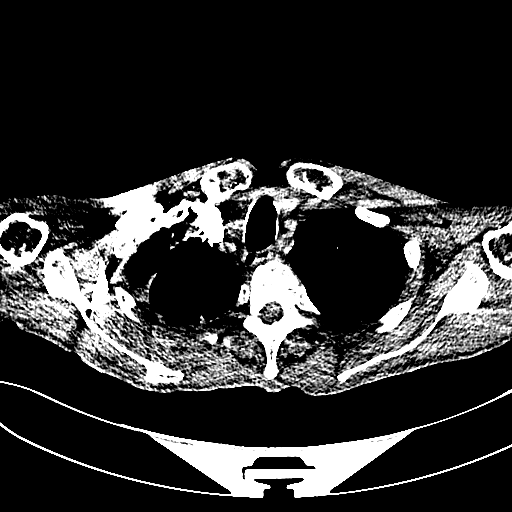
[im 140/165  bone]
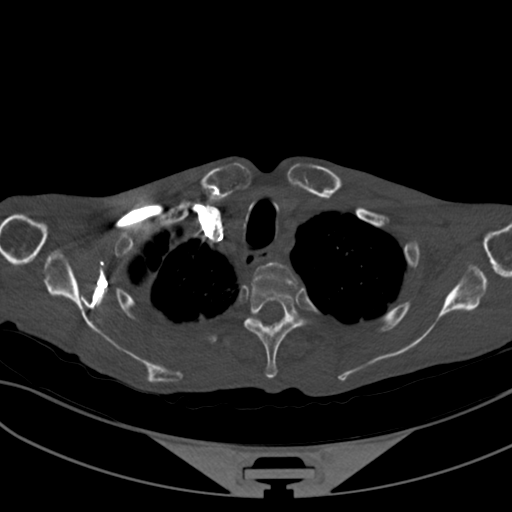
[im 156/165  brain]
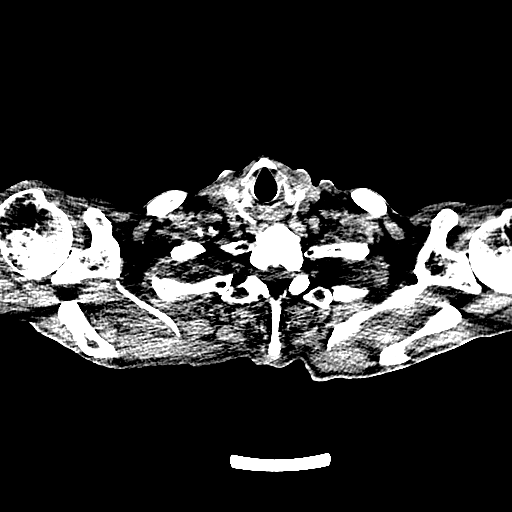

[Series 9: coronal soft tissue · coronal · 0.35mm/px · 3 of 68 slices shown]
[im 23/68  brain]
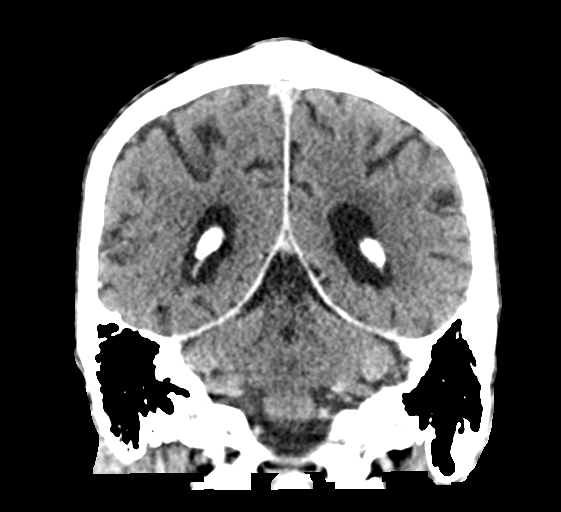
[im 30/68  brain]
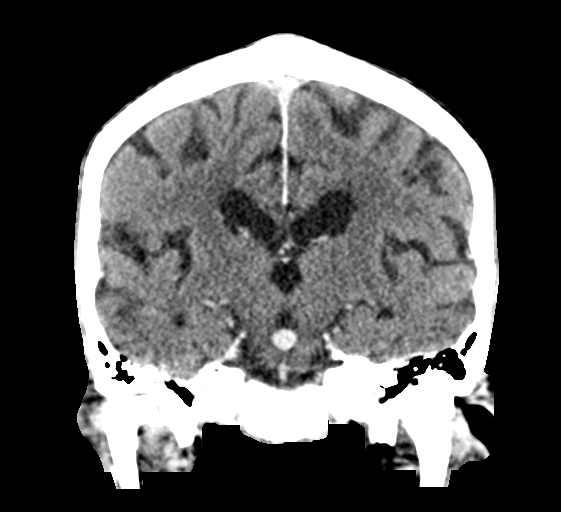
[im 38/68  brain]
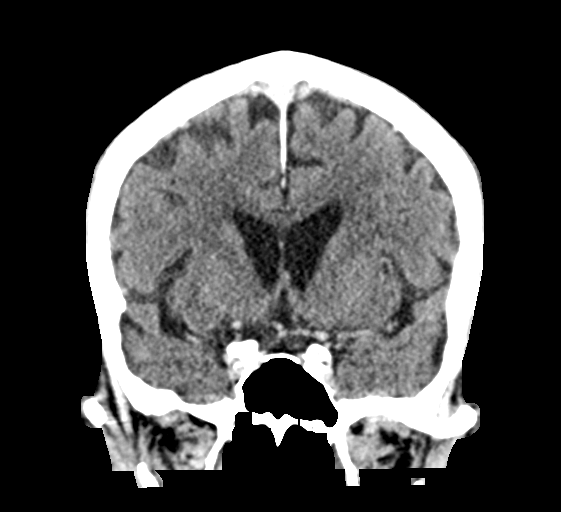

[Series 10: sagittal soft tissue · sagittal · 0.35mm/px · 3 of 65 slices shown]
[im 22/65  brain]
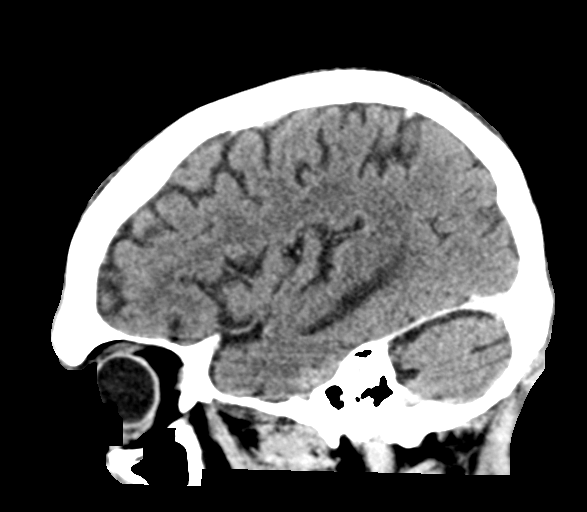
[im 33/65  brain]
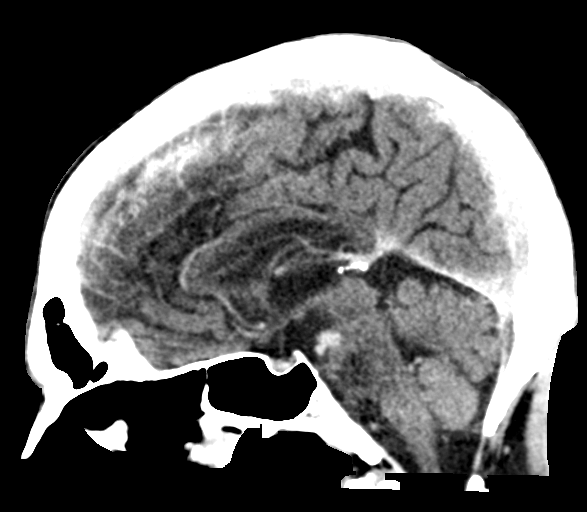
[im 43/65  brain]
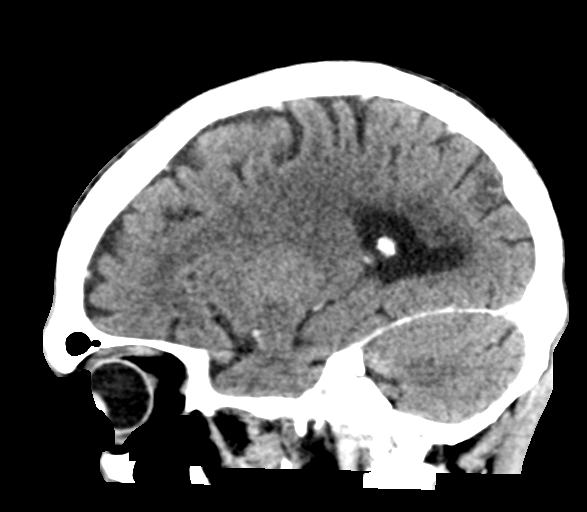

[16 of 47 positions shown; findings below may reference images not displayed]

FINDINGS: Brain: No acute territorial infarction, hemorrhage, or intracranial
mass is visualized. Mild atrophy and chronic small vessel ischemic
changes of the white matter. Stable ventricle size. No abnormal
focal contrast enhancement within the brain parenchyma.

Vascular: No hyperdense vessels. Stable 9 x 8 mm basilar tip
aneurysm, series 8, image 12.

Skull: Normal. Negative for fracture or focal lesion.

Sinuses/Orbits: No acute finding.

Other: None
IMPRESSION: 1. No CT evidence for acute intracranial abnormality. Atrophy and
chronic small-vessel ischemic changes of the white matter. Negative
for enhancing mass lesion.
2. Stable 9 x 8 mm basilar tip aneurysm.

## 2021-02-28 MED ORDER — BUDESONIDE 0.25 MG/2ML IN SUSP
0.2500 mg | Freq: Two times a day (BID) | RESPIRATORY_TRACT | Status: DC
  Administered 2021-03-01 (×2): 0.25 mg via RESPIRATORY_TRACT
  Filled 2021-02-28 (×3): qty 2

## 2021-02-28 MED ORDER — SODIUM CHLORIDE 0.9 % IV SOLN
1.0000 g | Freq: Once | INTRAVENOUS | Status: AC
  Administered 2021-02-28: 1 g via INTRAVENOUS
  Filled 2021-02-28: qty 10

## 2021-02-28 MED ORDER — HEPARIN SODIUM (PORCINE) 5000 UNIT/ML IJ SOLN
5000.0000 [IU] | Freq: Three times a day (TID) | INTRAMUSCULAR | Status: DC
Start: 2021-02-28 — End: 2021-03-02
  Administered 2021-03-01 – 2021-03-02 (×3): 5000 [IU] via SUBCUTANEOUS
  Filled 2021-02-28 (×3): qty 1

## 2021-02-28 MED ORDER — VANCOMYCIN HCL IN DEXTROSE 1-5 GM/200ML-% IV SOLN
1000.0000 mg | Freq: Once | INTRAVENOUS | Status: AC
  Administered 2021-02-28: 1000 mg via INTRAVENOUS
  Filled 2021-02-28: qty 200

## 2021-02-28 MED ORDER — OXYCODONE HCL 5 MG PO TABS
5.0000 mg | ORAL_TABLET | Freq: Three times a day (TID) | ORAL | Status: DC | PRN
Start: 2021-02-28 — End: 2021-03-01

## 2021-02-28 MED ORDER — HYDROMORPHONE HCL 1 MG/ML IJ SOLN
1.0000 mg | Freq: Once | INTRAMUSCULAR | Status: AC
  Administered 2021-02-28: 1 mg via INTRAVENOUS
  Filled 2021-02-28: qty 1

## 2021-02-28 MED ORDER — ACETAMINOPHEN 325 MG PO TABS: 650.0000 mg | ORAL_TABLET | ORAL | Status: DC | PRN

## 2021-02-28 MED ORDER — IPRATROPIUM-ALBUTEROL 0.5-2.5 (3) MG/3ML IN SOLN
3.0000 mL | Freq: Four times a day (QID) | RESPIRATORY_TRACT | Status: DC
  Administered 2021-03-01 (×2): 3 mL via RESPIRATORY_TRACT
  Filled 2021-02-28 (×2): qty 3

## 2021-02-28 MED ORDER — SODIUM CHLORIDE (PF) 0.9 % IJ SOLN
INTRAMUSCULAR | Status: AC
  Filled 2021-02-28: qty 50

## 2021-02-28 MED ORDER — SODIUM CHLORIDE 0.9 % IV BOLUS
30.0000 mL/kg | Freq: Once | INTRAVENOUS | Status: AC
  Administered 2021-02-28: 1755 mL via INTRAVENOUS

## 2021-02-28 MED ORDER — ZIPRASIDONE MESYLATE 20 MG IM SOLR
20.0000 mg | INTRAMUSCULAR | Status: DC | PRN
  Filled 2021-02-28: qty 20

## 2021-02-28 MED ORDER — PANTOPRAZOLE SODIUM 40 MG IV SOLR
40.0000 mg | Freq: Every day | INTRAVENOUS | Status: DC
  Administered 2021-03-01 (×2): 40 mg via INTRAVENOUS
  Filled 2021-02-28 (×2): qty 40

## 2021-02-28 MED ORDER — DEXMEDETOMIDINE HCL IN NACL 200 MCG/50ML IV SOLN
0.4000 ug/kg/h | INTRAVENOUS | Status: DC
  Administered 2021-02-28: 0.8 ug/kg/h via INTRAVENOUS
  Administered 2021-02-28: 0.4 ug/kg/h via INTRAVENOUS
  Administered 2021-03-01 (×2): 0.5 ug/kg/h via INTRAVENOUS
  Administered 2021-03-01: 0.3 ug/kg/h via INTRAVENOUS
  Administered 2021-03-01: 0.5 ug/kg/h via INTRAVENOUS
  Filled 2021-02-28 (×6): qty 50

## 2021-02-28 MED ORDER — DOCUSATE SODIUM 100 MG PO CAPS: 100.0000 mg | ORAL_CAPSULE | Freq: Two times a day (BID) | ORAL | Status: DC | PRN

## 2021-02-28 MED ORDER — LACTATED RINGERS IV SOLN: INTRAVENOUS | Status: DC

## 2021-02-28 MED ORDER — METOPROLOL TARTRATE 25 MG PO TABS
50.0000 mg | ORAL_TABLET | Freq: Two times a day (BID) | ORAL | Status: DC
  Filled 2021-02-28: qty 2

## 2021-02-28 MED ORDER — OLANZAPINE 10 MG IM SOLR
5.0000 mg | Freq: Once | INTRAMUSCULAR | Status: AC
  Administered 2021-02-28: 5 mg via INTRAMUSCULAR
  Filled 2021-02-28: qty 10

## 2021-02-28 MED ORDER — POLYETHYLENE GLYCOL 3350 17 G PO PACK: 17.0000 g | PACK | Freq: Every day | ORAL | Status: DC | PRN

## 2021-02-28 MED ORDER — ONDANSETRON HCL 4 MG/2ML IJ SOLN
4.0000 mg | Freq: Once | INTRAMUSCULAR | Status: AC
  Administered 2021-02-28: 4 mg via INTRAVENOUS
  Filled 2021-02-28: qty 2

## 2021-02-28 MED ORDER — IOHEXOL 350 MG/ML SOLN
80.0000 mL | Freq: Once | INTRAVENOUS | Status: AC | PRN
  Administered 2021-02-28: 80 mL via INTRAVENOUS

## 2021-02-28 MED ORDER — ONDANSETRON HCL 4 MG/2ML IJ SOLN: 4.0000 mg | Freq: Four times a day (QID) | INTRAMUSCULAR | Status: DC | PRN

## 2021-02-28 MED ORDER — MORPHINE SULFATE ER 30 MG PO TBCR: 30.0000 mg | EXTENDED_RELEASE_TABLET | Freq: Two times a day (BID) | ORAL | Status: DC

## 2021-02-28 MED ORDER — MIRTAZAPINE 15 MG PO TABS: 30.0000 mg | ORAL_TABLET | Freq: Every day | ORAL | Status: DC

## 2021-02-28 MED ORDER — SODIUM CHLORIDE 0.9 % IV SOLN
500.0000 mg | Freq: Once | INTRAVENOUS | Status: AC
  Administered 2021-02-28: 500 mg via INTRAVENOUS
  Filled 2021-02-28: qty 500

## 2021-02-28 MED ORDER — MIDAZOLAM HCL 2 MG/2ML IJ SOLN
4.0000 mg | Freq: Once | INTRAMUSCULAR | Status: AC
  Administered 2021-02-28: 4 mg via INTRAVENOUS
  Filled 2021-02-28: qty 4

## 2021-02-28 MED ORDER — FOLIC ACID 1 MG PO TABS: 1.0000 mg | ORAL_TABLET | Freq: Every day | ORAL | Status: DC

## 2021-02-28 NOTE — ED Triage Notes (Addendum)
Pt BIB EMS c/o SOB. Hx of lung cancer. Pt was 70% RA after neb increased to 95%. Pt received 10mg  albuterol and 0.5mg  atrovent. Pt states hes receiving weekly CA tx. Pt currently on 8LNC  BP-108/90 HR-72 RR-26 CBG-114

## 2021-02-28 NOTE — ED Notes (Signed)
CRITICAL VALUE STICKER  CRITICAL VALUE: pH 7.637, CO2 16.2  RECEIVER (on-site recipient of call): Gordy Clement, RN  DATE & TIME NOTIFIED: 02/28/21 @ 14:27  MESSENGER (representative from lab): A. Jaclyn Prime  MD NOTIFIED: Dr. Danice Goltz  TIME OF NOTIFICATION: 14:28  RESPONSE: Dr. Joya Gaskins verbalized results

## 2021-02-28 NOTE — ED Notes (Addendum)
Pt too agitated to get vitals (BP).

## 2021-02-28 NOTE — ED Notes (Signed)
Labs delayed due to pt being very agitated and unable to stay still.

## 2021-02-28 NOTE — H&P (Signed)
NAME:  Christopher Harding MRN:  [id number] DOB:  November 14, 1950 LOS: 0 ADMISSION DATE:  02/28/2021 DATE OF SERVICE:  02/28/2021  CHIEF COMPLAINT:  psychomotor agitation   HISTORY & PHYSICAL  History of Present Illness  This 70 y.o. Caucasian male reformed smoker presented to [facility] Emergency Department via EMS with complaints of increased dyspnea.  The patient was experiencing rapidly progressive worsening of dyspnea at home (dyspnea at rest), which was associated with a panic reaction, which seemed to be a vicious cycle as described by the patient's wife.  She notes that the patient has having diarreha for the past 2-3 days.  She notes that the increased dyspnea seems to have been insidious in onset.  She adds that he has been having increased cough productive of clear mucous secretions.  The patient's SpO2 improved from 70% on room air to 95% after receiving albuterol 10 mg and Atrovent 0.5 mg by EMS.  He was transported to the ER on 8 LPM by nasal cannula.  However, in the ER, the patient demonstrated combative, belligerent behavior and impeded attempts to provide him care.  He was treated with doses of Zyprexa, Versed and Dilaudid without significant improvement.  He was started on Precedex infusion, which appears to have dramatic improved the patient's temperament.  At the time of clinical interview, he is awake, alert, conversant and cooperative.    CT head was negative for pulmonary embolism and redemonstrated previously identified consolidation and bandlike density in the right upper lobe and superior segment of right lower lobe, small right-sided pleural effusion, and other punctate bilateral pulmonary nodules, along with a large metastatic lesion in the left hepatic lobe.  CT head was negative for an acute intracranial process.  REVIEW OF SYSTEMS Constitutional: Weight loss. No night sweats. No fever. No chills. No fatigue. HEENT: No headaches, dysphagia, sore throat, otalgia, nasal congestion,  PND CV:  No chest pain, orthopnea, PND, swelling in lower extremities, palpitations GI:  No abdominal pain, nausea, vomiting, diarrhea, change in bowel pattern, anorexia Resp: Dyspnea at rest improved.  Exertional dyspnea.  Cough productive of mucous secretions.  No hemoptysis, wheezing. GU: no dysuria, change in color of urine, no urgency or frequency.  No flank pain. MS:  No joint pain or swelling. No myalgias,  No decreased range of motion.  Psych:  Psychomotor agitation.  No change in mood or affect. No memory loss. Skin: no rash or lesions.   Past Medical/Surgical/Social/Family History   Past Medical History:  Diagnosis Date   A-fib [facility])    s/p DCCV 12/07/18   Cancer ([place])    Depression    situational   Dysrhythmia    Hypertension     Past Surgical History:  Procedure Laterality Date   CARDIOVERSION N/A 12/07/2018   Procedure: CARDIOVERSION;  Surgeon: Josue Hector, MD;  Location: [place];  Service: Cardiovascular;  Laterality: N/A;   FINE NEEDLE ASPIRATION  10/30/2019   Procedure: FINE NEEDLE ASPIRATION (FNA) LINEAR;  Surgeon: Candee Furbish, MD;  Location: [facility] ENDOSCOPY;  Service: Pulmonary;;   INGUINAL HERNIA REPAIR Right 01/25/2019   Procedure: RIGHT INGUINAL HERNIA REPAIR WITH MESH;  Surgeon: Coralie Keens, MD;  Location: [place];  Service: General;  Laterality: Right;  TAP BLOCK   INSERTION OF MESH Right 01/25/2019   Procedure: Insertion Of Mesh;  Surgeon: Coralie Keens, MD;  Location: [place];  Service: General;  Laterality: Right;   VIDEO BRONCHOSCOPY WITH ENDOBRONCHIAL ULTRASOUND N/A 10/30/2019   Procedure: VIDEO BRONCHOSCOPY  WITH ENDOBRONCHIAL ULTRASOUND;  Surgeon: Candee Furbish, MD;  Location: [facility] ENDOSCOPY;  Service: Pulmonary;  Laterality: N/A;    Social History   Tobacco Use   Smoking status: Former    Packs/day: 0.50    Years: 45.00    Pack years: 22.50    Types: Cigarettes   Smokeless tobacco: Never  Substance Use Topics   Alcohol use: Yes     Alcohol/week: 50.0 standard drinks    Types: 50 Cans of beer per week    Family History  Problem Relation Age of Onset   Dementia Mother    Alzheimer's disease Mother    Hypertension Mother    Stroke Father    Clotting disorder Father    Esophageal cancer Neg Hx    Stomach cancer Neg Hx    Rectal cancer Neg Hx    Colon polyps Neg Hx      Procedures:     Significant Diagnostic Tests:     Micro Data:   Results for orders placed or performed during the hospital encounter of 02/28/21  Resp Panel by RT-PCR (Flu A&B, Covid) Nasopharyngeal Swab     Status: None   Collection Time: 02/28/21  2:40 PM   Specimen: Nasopharyngeal Swab; Nasopharyngeal(NP) swabs in vial transport medium  Result Value Ref Range Status   SARS Coronavirus 2 by RT PCR NEGATIVE NEGATIVE Final    Comment: (NOTE) SARS-CoV-2 target nucleic acids are NOT DETECTED.  The SARS-CoV-2 RNA is generally detectable in upper respiratory specimens during the acute phase of infection. The lowest concentration of SARS-CoV-2 viral copies this assay can detect is 138 copies/mL. A negative result does not preclude SARS-Cov-2 infection and should not be used as the sole basis for treatment or other patient management decisions. A negative result may occur with  improper specimen collection/handling, submission of specimen other than nasopharyngeal swab, presence of viral mutation(s) within the areas targeted by this assay, and inadequate number of viral copies(<138 copies/mL). A negative result must be combined with clinical observations, patient history, and epidemiological information. The expected result is Negative.  Fact Sheet for Patients:  EntrepreneurPulse.com.au  Fact Sheet for Healthcare Providers:  IncredibleEmployment.be  This test is no t yet approved or cleared by the [place] FDA and  has been authorized for detection and/or diagnosis of SARS-CoV-2 by FDA under  an Emergency Use Authorization (EUA). This EUA will remain  in effect (meaning this test can be used) for the duration of the COVID-19 declaration under Section 564(b)(1) of the Act, 21 U.S.C.section 360bbb-3(b)(1), unless the authorization is terminated  or revoked sooner.       Influenza A by PCR NEGATIVE NEGATIVE Final   Influenza B by PCR NEGATIVE NEGATIVE Final    Comment: (NOTE) The Xpert Xpress SARS-CoV-2/FLU/RSV plus assay is intended as an aid in the diagnosis of influenza from Nasopharyngeal swab specimens and should not be used as a sole basis for treatment. Nasal washings and aspirates are unacceptable for Xpert Xpress SARS-CoV-2/FLU/RSV testing.  Fact Sheet for Patients: EntrepreneurPulse.com.au  Fact Sheet for Healthcare Providers: IncredibleEmployment.be  This test is not yet approved or cleared by the [place] FDA and has been authorized for detection and/or diagnosis of SARS-CoV-2 by FDA under an Emergency Use Authorization (EUA). This EUA will remain in effect (meaning this test can be used) for the duration of the COVID-19 declaration under Section 564(b)(1) of the Act, 21 U.S.C. section 360bbb-3(b)(1), unless the authorization is terminated or revoked.  Performed at [facility], [place] [address] [place], [place] [address]       Antimicrobials:  Rocephin/azithromycin/vancomycin (9/24>>)    Interim history/subjective:     Objective   BP 99/86   Pulse 76   Temp 97.7 F (36.5 C) (Axillary)   Resp 16   Ht 6\' 2"  (1.88 m)   Wt 58.5 kg   SpO2 100%   BMI 16.56 kg/m     Filed Weights   02/28/21 1357  Weight: 58.5 kg    Intake/Output Summary (Last 24 hours) at 02/28/2021 2306 Last data filed at 02/28/2021 2144 Gross per 24 hour  Intake 2327.18 ml  Output --  Net 2327.18 ml        Examination: GENERAL:  alert, oriented to time, person and place, pleasant, cachectic. No acute  distress. On Precedex. HEAD: normocephalic, atraumatic EYE: PERRLA, EOM intact, no scleral icterus, no pallor. THROAT/ORAL CAVITY: Normal dentition. No oral thrush. No exudate. Mucous membranes are DRY. No tonsillar enlargement.  NECK: supple, no thyromegaly, no JVD, no lymphadenopathy. Trachea midline. CHEST/LUNG: symmetric in development and expansion. Good air entry. No crackles. No wheezes. HEART: Regular S1 and S2 without murmur, rub or gallop. ABDOMEN: soft, nontender, nondistended. Normoactive bowel sounds. No rebound. No guarding. No hepatosplenomegaly. EXTREMITIES: Edema: none. No cyanosis. No clubbing. 2+ DP pulses LYMPHATIC: no cervical/axillary/inguinal lymph nodes appreciated MUSCULOSKELETAL: No point tenderness. Bulk atrophy. Joints: normal inspection.  SKIN:  No rash or lesion. NEUROLOGIC: Doll's eyes intact. Corneal reflex intact. Spontaneous respirations intact. Cranial nerves II-XII are grossly symmetric and physiologic. Babinski absent. No sensory deficit. Motor: 5/5 @ RUE, 5/5 @ LUE, 5/5 @ RLL,  5/5 @ LLL.  DTR: 2+ @ R biceps, 2+ @ L biceps, 2+ @ R patellar,  2+ @ L patellar. No cerebellar signs. Gait was not assessed.   Resolved Hospital Problem list      Assessment & Plan:   ASSESSMENT/PLAN:  ASSESSMENT (included in the Hospital Problem List)  Principal Problem:   Acute hypoxemic respiratory failure (HCC) Active Problems:   Psychomotor agitation   Adenocarcinoma, lung, right (HCC)   Cancer associated pain   Hypokalemia   Leukopenia due to antineoplastic chemotherapy (HCC)   Essential hypertension   Normocytic anemia   By systems: PULMONARY Acute hypoxemic respiratory failure Titrate supplemental oxygen as needed to maintain SpO2 93+% For now, it is unclear which cam first: respiratory distress/oxygen desaturation or anxiety.  He is currently able to breathe comfortably on room air.   CARDIOVASCULAR History of atrial fibrillation The patient's wife  reports that the patient was successfully cardioverted out of atrial fibrillation 2 years ago but his Xarelto has never been stopped.  She is concerned because he has missed doses while in the ER.  No history of venous thromboembolism. Hold Xarelto for now.   RENAL Dehydration Rehydrate with LR   GASTROINTESTINAL Diarrhea Continue Imodium GI PROPHYLAXIS: Protonix   HEMATOLOGIC/ONCOLOGIC Leukopenia, likely due to chemotherapy Normocytic anemia Monitor CBC Follow up cultures Consult Oncology.  Established patient.  Due to get cycle 4 of Keytruda/Alimta/carboplatin next week. DVT PROPHYLAXIS: heparin   INFECTIOUS: No acute issues Check procalcitonin Follow up blood cultures, respiratory culture Continue empiric antibiotics for now, but low threshold to discontinue in the absence of compelling data to continue   ENDOCRINE: No acute issues   NEUROLOGIC Psychomotor agitation Depression Cancer pain Continue Precedex for now.  Titrate for RASS 0. Geodon 10 mg IM q 2 hr PRN for agitation Contineu Remeron MRI brain to evaluate for  brain metastases when able Continue pain medications (home medications).  Hold NSAIDs for now.   PLAN/RECOMMENDATIONS  Admit to ICU under my service (Attending: Renee Pain, MD) with the diagnoses highlighted above in the active Hospital Problem List (ASSESSMENT). See above NUTRITION: cardiac    My assessment, plan of care, findings, medications, side effects, etc. were discussed with: nurse, patient (answered all questions to patient's satisfaction), and patient's next of kin (answered all questions to his/her satisfaction).   Best practice:  Diet: cardiac Pain/Anxiety/Delirium protocol (if indicated): YES VAP protocol (if indicated): N/A DVT prophylaxis: heparin GI prophylaxis: Protonix Glucose control: N/A Mobility/Activity: bedrest   Code Status: Full Code Family Communication:  patient's family (wife updated at bedside) Disposition:  admit to ICU   Labs   CBC: Recent Labs  Lab 02/28/21 1356  WBC 2.0*  NEUTROABS 1.1*  HGB 9.8*  HCT 28.0*  MCV 89.2  PLT 659    Basic Metabolic Panel: Recent Labs  Lab 02/28/21 1356  NA 137  K 3.0*  CL 100  CO2 21*  GLUCOSE 115*  BUN 8  CREATININE 0.55*  CALCIUM 9.4   GFR: Estimated Creatinine Clearance: 71.1 mL/min (A) (by C-G formula based on SCr of 0.55 mg/dL (L)). Recent Labs  Lab 02/28/21 1356 02/28/21 1440  WBC 2.0*  --   LATICACIDVEN  --  3.8*    Liver Function Tests: Recent Labs  Lab 02/28/21 1356  AST 21  ALT 10  ALKPHOS 107  BILITOT 1.2  PROT 6.8  ALBUMIN 3.4*   No results for input(s): LIPASE, AMYLASE in the last 168 hours. No results for input(s): AMMONIA in the last 168 hours.  ABG    Component Value Date/Time   PHART 7.637 (HH) 02/28/2021 1400   PCO2ART 16.2 (LL) 02/28/2021 1400   PO2ART 257 (H) 02/28/2021 1400   HCO3 17.6 (L) 02/28/2021 1400   ACIDBASEDEF 2.1 (H) 02/28/2021 1400   O2SAT 100.0 02/28/2021 1400     Coagulation Profile: Recent Labs  Lab 02/28/21 1356  INR 1.2    Cardiac Enzymes: No results for input(s): CKTOTAL, CKMB, CKMBINDEX, TROPONINI in the last 168 hours.  HbA1C: Hgb A1c MFr Bld  Date/Time Value Ref Range Status  02/15/2017 04:39 PM 4.9 4.8 - 5.6 % Final    Comment:             Prediabetes: 5.7 - 6.4          Diabetes: >6.4          Glycemic control for adults with diabetes: <7.0     CBG: No results for input(s): GLUCAP in the last 168 hours.   Past Medical History   Past Medical History:  Diagnosis Date   A-fib [facility])    s/p DCCV 12/07/18   Cancer ([place])    Depression    situational   Dysrhythmia    Hypertension       Surgical History    Past Surgical History:  Procedure Laterality Date   CARDIOVERSION N/A 12/07/2018   Procedure: CARDIOVERSION;  Surgeon: Josue Hector, MD;  Location: [facility] ENDOSCOPY;  Service: Cardiovascular;  Laterality: N/A;   FINE NEEDLE ASPIRATION  10/30/2019    Procedure: FINE NEEDLE ASPIRATION (FNA) LINEAR;  Surgeon: Candee Furbish, MD;  Location: [facility] ENDOSCOPY;  Service: Pulmonary;;   INGUINAL HERNIA REPAIR Right 01/25/2019   Procedure: RIGHT INGUINAL HERNIA REPAIR WITH MESH;  Surgeon: Coralie Keens, MD;  Location: [place];  Service: General;  Laterality: Right;  TAP BLOCK  INSERTION OF MESH Right 01/25/2019   Procedure: Insertion Of Mesh;  Surgeon: Coralie Keens, MD;  Location: [place];  Service: General;  Laterality: Right;   VIDEO BRONCHOSCOPY WITH ENDOBRONCHIAL ULTRASOUND N/A 10/30/2019   Procedure: VIDEO BRONCHOSCOPY WITH ENDOBRONCHIAL ULTRASOUND;  Surgeon: Candee Furbish, MD;  Location: [facility] ENDOSCOPY;  Service: Pulmonary;  Laterality: N/A;      Social History   Social History   Socioeconomic History   Marital status: Married    Spouse name: Not on file   Number of children: 2   Years of education: Not on file   Highest education level: Not on file  Occupational History   Occupation: retired  Tobacco Use   Smoking status: Former    Packs/day: 0.50    Years: 45.00    Pack years: 22.50    Types: Cigarettes   Smokeless tobacco: Never  Vaping Use   Vaping Use: Never used  Substance and Sexual Activity   Alcohol use: Yes    Alcohol/week: 50.0 standard drinks    Types: 50 Cans of beer per week   Drug use: No   Sexual activity: Not on file  Other Topics Concern   Not on file  Social History Narrative   Not on file   Social Determinants of Health   Financial Resource Strain: Not on file  Food Insecurity: Not on file  Transportation Needs: Not on file  Physical Activity: Not on file  Stress: Not on file  Social Connections: Not on file      Family History    Family History  Problem Relation Age of Onset   Dementia Mother    Alzheimer's disease Mother    Hypertension Mother    Stroke Father    Clotting disorder Father    Esophageal cancer Neg Hx    Stomach cancer Neg Hx    Rectal cancer Neg Hx    Colon polyps  Neg Hx    family history includes Alzheimer's disease in his mother; Clotting disorder in his father; Dementia in his mother; Hypertension in his mother; Stroke in his father. There is no history of Esophageal cancer, Stomach cancer, Rectal cancer, or Colon polyps.    Allergies No Known Allergies    Current Medications  Current Facility-Administered Medications:    dexmedetomidine (PRECEDEX) 200 MCG/50ML (4 mcg/mL) infusion, 0.4-1.2 mcg/kg/hr, Intravenous, Titrated, Arnaldo Natal, MD, Last Rate: 14.63 mL/hr at 02/28/21 2110, 1 mcg/kg/hr at 02/28/21 2110  Current Outpatient Medications:    folic acid (FOLVITE) 1 MG tablet, Take 1 tablet (1 mg total) by mouth daily., Disp: 30 tablet, Rfl: 4   megestrol (MEGACE ES) 625 MG/5ML suspension, Take 5 mLs (625 mg total) by mouth daily. (Patient not taking: Reported on 02/12/2021), Disp: 150 mL, Rfl: 0   metoprolol tartrate (LOPRESSOR) 50 MG tablet, Take 50 mg by mouth 2 (two) times daily with a meal., Disp: , Rfl:    mirtazapine (REMERON) 30 MG tablet, TAKE 1 TABLET BY MOUTH AT BEDTIME., Disp: 90 tablet, Rfl: 1   morphine (MS CONTIN) 30 MG 12 hr tablet, Take 1 tablet (30 mg total) by mouth every 12 (twelve) hours., Disp: 60 tablet, Rfl: 0   naproxen sodium (ALEVE) 220 MG tablet, Take 220 mg by mouth daily as needed (pain). (Patient not taking: Reported on 02/12/2021), Disp: , Rfl:    oxyCODONE (OXY IR/ROXICODONE) 5 MG immediate release tablet, Take 1 tablet (5 mg total) by mouth 3 (three) times daily as needed for severe pain., Disp:  30 tablet, Rfl: 0   polyethylene glycol (MIRALAX / GLYCOLAX) 17 g packet, Take 17 g by mouth daily., Disp: , Rfl:    prochlorperazine (COMPAZINE) 10 MG tablet, Take 1 tablet (10 mg total) by mouth every 6 (six) hours as needed for nausea or vomiting., Disp: 30 tablet, Rfl: 0   XARELTO 20 MG TABS tablet, Take 20 mg by mouth daily., Disp: , Rfl:    Home Medications  Prior to Admission medications   Medication Sig Start Date  End Date Taking? Authorizing Provider  folic acid (FOLVITE) 1 MG tablet Take 1 tablet (1 mg total) by mouth daily. 01/15/21   Curt Bears, MD  megestrol (MEGACE ES) 625 MG/5ML suspension Take 5 mLs (625 mg total) by mouth daily. Patient not taking: Reported on 02/12/2021 01/28/21   Curt Bears, MD  metoprolol tartrate (LOPRESSOR) 50 MG tablet Take 50 mg by mouth 2 (two) times daily with a meal. 09/14/18   [provider]  mirtazapine (REMERON) 30 MG tablet TAKE 1 TABLET BY MOUTH AT BEDTIME. 12/22/20   Curt Bears, MD  morphine (MS CONTIN) 30 MG 12 hr tablet Take 1 tablet (30 mg total) by mouth every 12 (twelve) hours. 02/02/21   Curt Bears, MD  naproxen sodium (ALEVE) 220 MG tablet Take 220 mg by mouth daily as needed (pain). Patient not taking: Reported on 02/12/2021    [provider]  oxyCODONE (OXY IR/ROXICODONE) 5 MG immediate release tablet Take 1 tablet (5 mg total) by mouth 3 (three) times daily as needed for severe pain. 01/12/21   Curt Bears, MD  polyethylene glycol (MIRALAX / GLYCOLAX) 17 g packet Take 17 g by mouth daily.    [provider]  prochlorperazine (COMPAZINE) 10 MG tablet Take 1 tablet (10 mg total) by mouth every 6 (six) hours as needed for nausea or vomiting. 01/15/21   Curt Bears, MD  XARELTO 20 MG TABS tablet Take 20 mg by mouth daily. 09/08/18   [provider]      Critical care time: 45 minutes.  The treatment and management of the patient's condition was required based on the threat of imminent deterioration. This time reflects time spent by the physician evaluating, providing care and managing the critically ill patient's care. The time was spent at the immediate bedside (or on the same floor/unit and dedicated to this patient's care). Time involved in separately billable procedures is NOT included int he critical care time indicated above. Family meeting and update time may be included above if and only if the  patient is unable/incompetent to participate in clinical interview and/or decision making, and the discussion was necessary to determining treatment decisions.   Renee Pain, MD Board Certified by the ABIM, [place]

## 2021-02-28 NOTE — ED Provider Notes (Signed)
[facility] DEPT Provider Note   CSN: [id number] Arrival date & time: 02/28/21  1320     History Chief Complaint  Patient presents with   Shortness of Breath    Christopher Harding is a 70 y.o. male.  Christopher Harding has a history of metastatic adenocarcinoma of the lung.  He is on palliative chemotherapy.  For the past 3 days, he has had copious diarrhea, and this finally slowed down with the use of Imodium.  This morning, he was sitting in bed when he suddenly became short of breath.  Despite having COPD and lung cancer, he is not on any supplemental oxygen.  His wife attempted to take him to the emergency department, but he refused to go when, and then went back home.  Shortly thereafter, they called EMS.  The patient was 70% on room air when EMS arrived, and they gave him a nebulizer treatment which improved his oxygen saturations to 95%.  The history is provided by the spouse. The history is limited by the condition of the patient (Patient is confused, unable to give history.).  Shortness of Breath Severity:  Severe Onset quality:  Gradual Duration:  4 hours Timing:  Constant Progression:  Worsening Chronicity:  New Context comment:  Has lung cancer but does not typically use O2 Relieved by:  Oxygen Worsened by:  Nothing Associated symptoms: abdominal pain (this am; does not endorse now) and cough   Associated symptoms: no chest pain, no ear pain, no fever, no hemoptysis, no rash, no sore throat and no vomiting       Past Medical History:  Diagnosis Date   A-fib ([place])    s/p DCCV 12/07/18   Cancer ([place])    Depression    situational   Dysrhythmia    Hypertension     Patient Active Problem List   Diagnosis Date Noted   Weight loss 02/12/2021   Cancer associated pain 01/28/2021   Radiation-induced esophageal stricture 11/28/2020   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   Cholangitis 09/12/2020   Encounter for antineoplastic  immunotherapy 02/07/2020   Adenocarcinoma, lung, right ([place]) 11/13/2019   Goals of care, counseling/discussion 11/13/2019   Adenocarcinoma of right lung, stage 3 ([place]) 11/13/2019   Encounter for antineoplastic chemotherapy 11/13/2019   Persistent atrial fibrillation ([place]) 11/23/2018   Essential hypertension 11/23/2018   Tobacco abuse 11/23/2018   Alcohol abuse 11/23/2018    Past Surgical History:  Procedure Laterality Date   CARDIOVERSION N/A 12/07/2018   Procedure: CARDIOVERSION;  Surgeon: Josue Hector, MD;  Location: [facility];  Service: Cardiovascular;  Laterality: N/A;   FINE NEEDLE ASPIRATION  10/30/2019   Procedure: FINE NEEDLE ASPIRATION (FNA) LINEAR;  Surgeon: Candee Furbish, MD;  Location: [facility] ENDOSCOPY;  Service: Pulmonary;;   INGUINAL HERNIA REPAIR Right 01/25/2019   Procedure: RIGHT INGUINAL HERNIA REPAIR WITH MESH;  Surgeon: Coralie Keens, MD;  Location: [facility];  Service: General;  Laterality: Right;  TAP BLOCK   INSERTION OF MESH Right 01/25/2019   Procedure: Insertion Of Mesh;  Surgeon: Coralie Keens, MD;  Location: [facility];  Service: General;  Laterality: Right;   VIDEO BRONCHOSCOPY WITH ENDOBRONCHIAL ULTRASOUND N/A 10/30/2019   Procedure: VIDEO BRONCHOSCOPY WITH ENDOBRONCHIAL ULTRASOUND;  Surgeon: Candee Furbish, MD;  Location: [facility] ENDOSCOPY;  Service: Pulmonary;  Laterality: N/A;       Family History  Problem Relation Age of Onset   Dementia Mother    Alzheimer's disease Mother    Hypertension  Mother    Stroke Father    Clotting disorder Father    Esophageal cancer Neg Hx    Stomach cancer Neg Hx    Rectal cancer Neg Hx    Colon polyps Neg Hx     Social History   Tobacco Use   Smoking status: Former    Packs/day: 0.50    Years: 45.00    Pack years: 22.50    Types: Cigarettes   Smokeless tobacco: Never  Vaping Use   Vaping Use: Never used  Substance Use Topics   Alcohol use: Yes    Alcohol/week: 50.0 standard drinks    Types: 50 Cans of beer  per week   Drug use: No    Home Medications Prior to Admission medications   Medication Sig Start Date End Date Taking? Authorizing Provider  folic acid (FOLVITE) 1 MG tablet Take 1 tablet (1 mg total) by mouth daily. 01/15/21   Curt Bears, MD  megestrol (MEGACE ES) 625 MG/5ML suspension Take 5 mLs (625 mg total) by mouth daily. Patient not taking: Reported on 02/12/2021 01/28/21   Curt Bears, MD  metoprolol tartrate (LOPRESSOR) 50 MG tablet Take 50 mg by mouth 2 (two) times daily with a meal. 09/14/18   [provider]  mirtazapine (REMERON) 30 MG tablet TAKE 1 TABLET BY MOUTH AT BEDTIME. 12/22/20   Curt Bears, MD  morphine (MS CONTIN) 30 MG 12 hr tablet Take 1 tablet (30 mg total) by mouth every 12 (twelve) hours. 02/02/21   Curt Bears, MD  naproxen sodium (ALEVE) 220 MG tablet Take 220 mg by mouth daily as needed (pain). Patient not taking: Reported on 02/12/2021    [provider]  oxyCODONE (OXY IR/ROXICODONE) 5 MG immediate release tablet Take 1 tablet (5 mg total) by mouth 3 (three) times daily as needed for severe pain. 01/12/21   Curt Bears, MD  polyethylene glycol (MIRALAX / GLYCOLAX) 17 g packet Take 17 g by mouth daily.    [provider]  prochlorperazine (COMPAZINE) 10 MG tablet Take 1 tablet (10 mg total) by mouth every 6 (six) hours as needed for nausea or vomiting. 01/15/21   Curt Bears, MD  XARELTO 20 MG TABS tablet Take 20 mg by mouth daily. 09/08/18   [provider]    Allergies    Patient has no known allergies.  Review of Systems   Review of Systems  Constitutional:  Negative for chills and fever.  HENT:  Negative for ear pain and sore throat.   Eyes:  Negative for pain and visual disturbance.  Respiratory:  Positive for cough and shortness of breath. Negative for hemoptysis.   Cardiovascular:  Negative for chest pain and palpitations.  Gastrointestinal:  Positive for abdominal pain (this am; does not  endorse now). Negative for vomiting.  Genitourinary:  Negative for dysuria and hematuria.  Musculoskeletal:  Negative for arthralgias and back pain.  Skin:  Negative for color change and rash.  Neurological:  Negative for seizures and syncope.  All other systems reviewed and are negative.  Physical Exam Updated Vital Signs BP 124/73 (BP Location: Left Arm)   Pulse (!) 55   Temp 97.7 F (36.5 C) (Axillary)   Resp 18   SpO2 100%   Physical Exam Constitutional:      General: He is in acute distress.     Appearance: He is ill-appearing.     Comments: Anxious  HENT:     Head: Normocephalic and atraumatic.  Eyes:  Extraocular Movements: Extraocular movements intact.  Cardiovascular:     Rate and Rhythm: Regular rhythm. Tachycardia present.  Pulmonary:     Effort: Tachypnea and accessory muscle usage present.     Breath sounds: Examination of the right-upper field reveals decreased breath sounds. Examination of the left-upper field reveals decreased breath sounds. Examination of the right-middle field reveals decreased breath sounds. Examination of the left-middle field reveals decreased breath sounds. Examination of the right-lower field reveals decreased breath sounds. Examination of the left-lower field reveals decreased breath sounds. Decreased breath sounds present. No wheezing.  Chest:     Chest wall: No deformity or crepitus.  Musculoskeletal:     Right lower leg: No edema.     Left lower leg: No edema.  Skin:    General: Skin is warm and dry.     Findings: No rash.  Neurological:     Mental Status: He is disoriented.     Comments: Moves all extremities, and he follows commands intermittently  Psychiatric:        Mood and Affect: Mood is anxious.    ED Results / Procedures / Treatments   Labs (all labs ordered are listed, but only abnormal results are displayed) Labs Reviewed  LACTIC ACID, PLASMA - Abnormal; Notable for the following components:      Result Value    Lactic Acid, Venous 3.8 (*)    All other components within normal limits  COMPREHENSIVE METABOLIC PANEL - Abnormal; Notable for the following components:   Potassium 3.0 (*)    CO2 21 (*)    Glucose, Bld 115 (*)    Creatinine, Ser 0.55 (*)    Albumin 3.4 (*)    Anion gap 16 (*)    All other components within normal limits  CBC WITH DIFFERENTIAL/PLATELET - Abnormal; Notable for the following components:   WBC 2.0 (*)    RBC 3.14 (*)    Hemoglobin 9.8 (*)    HCT 28.0 (*)    Neutro Abs 1.1 (*)    Lymphs Abs 0.4 (*)    All other components within normal limits  PROTIME-INR - Abnormal; Notable for the following components:   Prothrombin Time 15.3 (*)    All other components within normal limits  D-DIMER, QUANTITATIVE - Abnormal; Notable for the following components:   D-Dimer, Quant 1.78 (*)    All other components within normal limits  BLOOD GAS, ARTERIAL - Abnormal; Notable for the following components:   pH, Arterial 7.637 (*)    pCO2 arterial 16.2 (*)    pO2, Arterial 257 (*)    Bicarbonate 17.6 (*)    Acid-base deficit 2.1 (*)    All other components within normal limits  RESP PANEL BY RT-PCR (FLU A&B, COVID) ARPGX2  CULTURE, BLOOD (SINGLE)  URINE CULTURE  APTT  BRAIN NATRIURETIC PEPTIDE  LACTIC ACID, PLASMA  URINALYSIS, ROUTINE W REFLEX MICROSCOPIC  BLOOD GAS, VENOUS  TROPONIN I (HIGH SENSITIVITY)  TROPONIN I (HIGH SENSITIVITY)    EKG EKG Interpretation  Date/Time:  Saturday February 28 2021 13:53:53 EDT Ventricular Rate:  70 PR Interval:  123 QRS Duration: 99 QT Interval:  380 QTC Calculation: 410 R Axis:     Text Interpretation: sinus rhythm with PVC First degree A-V block no obvious ischemia Confirmed by Lorre Munroe ([id number]) on 02/28/2021 1:57:47 PM  Radiology CT HEAD W & WO CONTRAST (5MM)  Result Date: 02/28/2021 CLINICAL DATA:  History of lung cancer, altered EXAM: CT HEAD WITHOUT AND WITH CONTRAST TECHNIQUE: Contiguous  axial images were obtained from  the base of the skull through the vertex without and with intravenous contrast CONTRAST:  67mL OMNIPAQUE IOHEXOL 350 MG/ML SOLN COMPARISON:  MRI 12/09/2020 FINDINGS: Brain: No acute territorial infarction, hemorrhage, or intracranial mass is visualized. Mild atrophy and chronic small vessel ischemic changes of the white matter. Stable ventricle size. No abnormal focal contrast enhancement within the brain parenchyma. Vascular: No hyperdense vessels. 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 Skull: Normal. Negative for fracture or focal lesion. Sinuses/Orbits: No acute finding. Other: None IMPRESSION: 1. No CT evidence for acute intracranial abnormality. Atrophy and chronic small-vessel ischemic changes of the white matter. Negative for enhancing mass lesion. 2. 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 Electronically Signed   By: Donavan Foil M.D.   On: 02/28/2021 21:59   CT Angio Chest PE W/Cm &/Or Wo Cm  Result Date: 02/28/2021 CLINICAL DATA:  History of lung cancer hypoxia altered EXAM: CT ANGIOGRAPHY CHEST WITH CONTRAST TECHNIQUE: Multidetector CT imaging of the chest was performed using the standard protocol during bolus administration of intravenous contrast. Multiplanar CT image reconstructions and MIPs were obtained to evaluate the vascular anatomy. CONTRAST:  41mL OMNIPAQUE IOHEXOL 350 MG/ML SOLN COMPARISON:  Chest x-ray 02/28/2021, CT chest 01/12/2021, 11/06/2020, 08/08/2020, PET CT 11/12/2018 FINDINGS: Cardiovascular: Satisfactory opacification of the pulmonary arteries to the segmental level. No evidence of pulmonary embolism. Moderate aortic atherosclerosis. No aneurysm. Coronary vascular calcification. Normal cardiac size. No pericardial effusion Mediastinum/Nodes: Midline trachea. No thyroid. No increasing adenopathy. Esophagus within normal limits. Lungs/Pleura: Small right-sided pleural effusion with loculation at the apex is stable to minimally increased in size. Bandlike  consolidation in the right upper lobe and superior segment of right lower lobe grossly similar in appearance. 4 mm left upper lobe lung nodule, series 4, image 58, previously 4 mm. Stable densely calcified nodule in the right lung base. Stable 3 mm left posterior subpleural lung nodule series 4, image 18. Stable 3 mm right subpleural lower lobe lung nodule, series 4, image 115. Upper Abdomen: Incompletely visualized large hepatic metastatic lesion in the left hepatic lobe. Musculoskeletal: No acute or suspicious osseous abnormality Review of the MIP images confirms the above findings. IMPRESSION: 1. No acute pulmonary embolus is seen. 2. Similar consolidation and bandlike density in the right upper lobe and superior segment of right lower lobe. Small right-sided pleural effusion with loculation superiorly, this is stable to minimally increased in size. Other punctate bilateral pulmonary nodules are unchanged. 3. Incompletely visualized large metastatic lesion in the left hepatic lobe Aortic Atherosclerosis (ICD10-I70.0). Electronically Signed   By: Donavan Foil M.D.   On: 02/28/2021 21:51   DG Chest Port 1 View  Result Date: 02/28/2021 CLINICAL DATA:  Questionable sepsis - evaluate for abnormality history lung cancer EXAM: PORTABLE CHEST 1 VIEW COMPARISON:  Oct 30, 2019, November 06, 2020 FINDINGS: The cardiomediastinal silhouette is unchanged in contour.Revisualization an irregular contour of the RIGHT upper lung along the mediastinal border, similar in comparison to prior. Asymmetric RIGHT apical pleural thickening, unchanged nodular opacity overlying the RIGHT costophrenic angle, unchanged and consistent with a calcified granuloma. No pleural effusion. No pneumothorax. No acute pleuroparenchymal abnormality. Visualized abdomen is unremarkable. IMPRESSION: No new focal consolidation. Electronically Signed   By: Valentino Saxon M.D.   On: 02/28/2021 14:09    Procedures .Critical Care Performed by: Arnaldo Natal, MD Authorized by: Arnaldo Natal, MD   Critical care provider statement:    Critical  care time (minutes):  60   Critical care time was exclusive of:  Separately billable procedures and treating other patients and teaching time   Critical care was necessary to treat or prevent imminent or life-threatening deterioration of the following conditions:  Respiratory failure, sepsis and CNS failure or compromise   Critical care was time spent personally by me on the following activities:  Development of treatment plan with patient or surrogate, discussions with consultants, evaluation of patient's response to treatment, examination of patient, obtaining history from patient or surrogate, ordering and performing treatments and interventions, ordering and review of laboratory studies, ordering and review of radiographic studies, pulse oximetry, re-evaluation of patient's condition and review of old charts   I assumed direction of critical care for this patient from another provider in my specialty: no     Care discussed with: admitting provider     Medications Ordered in ED Medications  dexmedetomidine (PRECEDEX) 200 MCG/50ML (4 mcg/mL) infusion (1 mcg/kg/hr  58.5 kg Intravenous Rate/Dose Change 02/28/21 2110)  iohexol (OMNIPAQUE) 350 MG/ML injection 80 mL (80 mLs Intravenous Contrast Given 02/28/21 2057)  OLANZapine (ZYPREXA) injection 5 mg (5 mg Intramuscular Given 02/28/21 1517)  sodium chloride (PF) 0.9 % injection (  Given 02/28/21 1523)  midazolam (VERSED) injection 4 mg (4 mg Intravenous Given 02/28/21 1550)  OLANZapine (ZYPREXA) injection 5 mg (5 mg Intramuscular Given 02/28/21 1653)  sodium chloride 0.9 % bolus 1,755 mL (0 mLs Intravenous Stopped 02/28/21 2144)  HYDROmorphone (DILAUDID) injection 1 mg (1 mg Intravenous Given 02/28/21 1754)  ondansetron (ZOFRAN) injection 4 mg (4 mg Intravenous Given 02/28/21 1755)  cefTRIAXone (ROCEPHIN) 1 g in sodium chloride 0.9 % 100 mL IVPB (0 g Intravenous  Stopped 02/28/21 2041)  azithromycin (ZITHROMAX) 500 mg in sodium chloride 0.9 % 250 mL IVPB (0 mg Intravenous Stopped 02/28/21 2144)  vancomycin (VANCOCIN) IVPB 1000 mg/200 mL premix (0 mg Intravenous Stopped 02/28/21 2144)    ED Course  I have reviewed the triage vital signs and the nursing notes.  Pertinent labs & imaging results that were available during my care of the patient were reviewed by me and considered in my medical decision making (see chart for details).  Clinical Course as of 03/01/21 1335  Sat Feb 28, 2021  1530 JUN OSMENT is still agitated despite receiving zyprexa. He requires additional medication in order to receive diagnostic studies. I counseled his wife that I want to be careful to slowly add sedatives in light of his tenuous respiratory condition. However, he is pulling things off, won't wear oxygen, and just kicked me. [AW]  1734 I checked on the patient, and he is still agitated, pulling at things, trying to kick at me, writhing.  I now plan to give him some IV pain medication.  I explained to his wife again, that I have attempted to titrate medication to treat his aggression but that all these medications have side effects that could be dangerous.  Furthermore, if he becomes excessively sedated, his respiratory status might decrease, and that might necessitate intubation which could be fatal. [AW]  1807 I spoke with Dr. Luna Fuse, hospitalist.  We discussed the case.  He recommended adding vancomycin since he is neutropenic.  He agreed with all other actions including the stepwise approach to sedation.  He mentioned the patient might require Precedex and therefore ICU admission.  We also agreed to change the head CT without contrast to a with and without contrast in order to evaluate for metastatic disease. [AW]  2240 I spoke with Mikki Harbor, PA for ICU. [AW]    Clinical Course User Index [AW] Arnaldo Natal, MD   MDM Rules/Calculators/A&P                            Christopher Harding presents with acute respiratory distress.  He has a known history of metastatic adenocarcinoma of his lung.  He was evaluated for evidence of pneumonia, worsening cancer, ACS, PE.  He was also quite delirious as noted above.  I was not sure whether or not this was secondary to an underlying medical process such as metastatic disease in his brain, hypoxia or hypercapnia, or a metabolic condition.  He required escalating doses of sedation.  Ultimately, it looks like he may have sepsis, possibly from consolidation in his right lung where his cancer is centered.  He is neutropenic and is at risk for serious infection. Final Clinical Impression(s) / ED Diagnoses Final diagnoses:  Sepsis with acute hypoxic respiratory failure without septic shock, due to unspecified organism [facility])  Community acquired pneumonia of right upper lobe of lung  Adenocarcinoma, lung, right ([facility])  Leukopenia due to antineoplastic chemotherapy ([place])  Agitation requiring sedation protocol    Rx / DC Orders ED Discharge Orders     None        Arnaldo Natal, MD 03/01/21 1336

## 2021-02-28 NOTE — ED Notes (Signed)
Medication on delay due to pt continuing to pull on cords and get out of bed. MD made aware.

## 2021-03-01 DIAGNOSIS — Z515 Encounter for palliative care: Secondary | ICD-10-CM

## 2021-03-01 DIAGNOSIS — R451 Restlessness and agitation: Secondary | ICD-10-CM | POA: Diagnosis not present

## 2021-03-01 DIAGNOSIS — D701 Agranulocytosis secondary to cancer chemotherapy: Secondary | ICD-10-CM | POA: Diagnosis not present

## 2021-03-01 DIAGNOSIS — J9601 Acute respiratory failure with hypoxia: Secondary | ICD-10-CM | POA: Diagnosis not present

## 2021-03-01 DIAGNOSIS — C3491 Malignant neoplasm of unspecified part of right bronchus or lung: Secondary | ICD-10-CM | POA: Diagnosis not present

## 2021-03-01 LAB — MAGNESIUM
Magnesium: 1.3 mg/dL — ABNORMAL LOW (ref 1.7–2.4)
Magnesium: 2.3 mg/dL (ref 1.7–2.4)

## 2021-03-01 LAB — BLOOD GAS, VENOUS
Acid-Base Excess: 0.9 mmol/L (ref 0.0–2.0)
Bicarbonate: 25.5 mmol/L (ref 20.0–28.0)
O2 Saturation: 28 %
Patient temperature: 98.6
pCO2, Ven: 43.7 mmHg — ABNORMAL LOW (ref 44.0–60.0)
pH, Ven: 7.383 (ref 7.250–7.430)
pO2, Ven: <31 mmHg — CL (ref 32.0–45.0)

## 2021-03-01 LAB — PROCALCITONIN: Procalcitonin: <0.1 ng/mL

## 2021-03-01 LAB — CBC
HCT: 21.3 % — ABNORMAL LOW (ref 39.0–52.0)
HCT: 22.1 % — ABNORMAL LOW (ref 39.0–52.0)
Hemoglobin: 7.4 g/dL — ABNORMAL LOW (ref 13.0–17.0)
Hemoglobin: 7.8 g/dL — ABNORMAL LOW (ref 13.0–17.0)
MCH: 31.5 pg (ref 26.0–34.0)
MCH: 31.6 pg (ref 26.0–34.0)
MCHC: 34.7 g/dL (ref 30.0–36.0)
MCHC: 35.3 g/dL (ref 30.0–36.0)
MCV: 90.6 fL (ref 80.0–100.0)
MCV: 89.5 fL (ref 80.0–100.0)
Platelets: 147 10*3/uL — ABNORMAL LOW (ref 150–400)
Platelets: 130 10*3/uL — ABNORMAL LOW (ref 150–400)
RBC: 2.35 MIL/uL — ABNORMAL LOW (ref 4.22–5.81)
RBC: 2.47 MIL/uL — ABNORMAL LOW (ref 4.22–5.81)
RDW: 14.6 % (ref 11.5–15.5)
RDW: 14.1 % (ref 11.5–15.5)
WBC: 1.3 10*3/uL — CL (ref 4.0–10.5)
WBC: 1.4 10*3/uL — CL (ref 4.0–10.5)
nRBC: 0 % (ref 0.0–0.2)
nRBC: 0 % (ref 0.0–0.2)

## 2021-03-01 LAB — BASIC METABOLIC PANEL
Anion gap: 11 (ref 5–15)
BUN: 7 mg/dL — ABNORMAL LOW (ref 8–23)
CO2: 22 mmol/L (ref 22–32)
Calcium: 8.6 mg/dL — ABNORMAL LOW (ref 8.9–10.3)
Chloride: 101 mmol/L (ref 98–111)
Creatinine, Ser: 0.61 mg/dL (ref 0.61–1.24)
GFR, Estimated: >60 mL/min (ref >60)
Glucose, Bld: 115 mg/dL — ABNORMAL HIGH (ref 70–99)
Potassium: 3.4 mmol/L — ABNORMAL LOW (ref 3.5–5.1)
Sodium: 134 mmol/L — ABNORMAL LOW (ref 135–145)

## 2021-03-01 LAB — LACTIC ACID, PLASMA: Lactic Acid, Venous: 1.1 mmol/L (ref 0.5–1.9)

## 2021-03-01 LAB — URINALYSIS, ROUTINE W REFLEX MICROSCOPIC
Bacteria, UA: NONE SEEN
Bilirubin Urine: NEGATIVE
Glucose, UA: NEGATIVE mg/dL
Hgb urine dipstick: NEGATIVE
Ketones, ur: NEGATIVE mg/dL
Leukocytes,Ua: NEGATIVE
Nitrite: NEGATIVE
Protein, ur: NEGATIVE mg/dL
Specific Gravity, Urine: 1.005 (ref 1.005–1.030)
pH: 7 (ref 5.0–8.0)

## 2021-03-01 LAB — CREATININE, SERUM
Creatinine, Ser: 0.59 mg/dL — ABNORMAL LOW (ref 0.61–1.24)
GFR, Estimated: >60 mL/min (ref >60)

## 2021-03-01 LAB — PHOSPHORUS: Phosphorus: 3.4 mg/dL (ref 2.5–4.6)

## 2021-03-01 LAB — MRSA NEXT GEN BY PCR, NASAL: MRSA by PCR Next Gen: NOT DETECTED

## 2021-03-01 LAB — HIV ANTIBODY (ROUTINE TESTING W REFLEX): HIV Screen 4th Generation wRfx: NONREACTIVE

## 2021-03-01 MED ORDER — SODIUM CHLORIDE 0.9 % IV SOLN: 500.0000 mg | Freq: Once | INTRAVENOUS | Status: DC

## 2021-03-01 MED ORDER — MAGNESIUM SULFATE 4 GM/100ML IV SOLN
4.0000 g | Freq: Once | INTRAVENOUS | Status: AC
  Administered 2021-03-01: 4 g via INTRAVENOUS
  Filled 2021-03-01: qty 100

## 2021-03-01 MED ORDER — METOPROLOL TARTRATE 5 MG/5ML IV SOLN: 2.5000 mg | INTRAVENOUS | Status: DC | PRN

## 2021-03-01 MED ORDER — SODIUM CHLORIDE 0.9 % IV SOLN
1.0000 g | Freq: Every day | INTRAVENOUS | Status: DC
  Administered 2021-03-01: 1 g via INTRAVENOUS
  Filled 2021-03-01 (×2): qty 10

## 2021-03-01 MED ORDER — POTASSIUM CHLORIDE 20 MEQ PO PACK: 40.0000 meq | PACK | ORAL | Status: DC

## 2021-03-01 MED ORDER — THIAMINE HCL 100 MG PO TABS: 100.0000 mg | ORAL_TABLET | Freq: Every day | ORAL | Status: DC

## 2021-03-01 MED ORDER — CHLORHEXIDINE GLUCONATE CLOTH 2 % EX PADS
6.0000 | MEDICATED_PAD | Freq: Every day | CUTANEOUS | Status: DC
  Administered 2021-03-01 – 2021-03-03 (×4): 6 via TOPICAL

## 2021-03-01 MED ORDER — ORAL CARE MOUTH RINSE
15.0000 mL | Freq: Two times a day (BID) | OROMUCOSAL | Status: DC
  Administered 2021-03-03: 15 mL via OROMUCOSAL

## 2021-03-01 MED ORDER — SODIUM CHLORIDE 0.9 % IV SOLN
500.0000 mg | Freq: Every day | INTRAVENOUS | Status: DC
  Administered 2021-03-01: 500 mg via INTRAVENOUS
  Filled 2021-03-01: qty 500

## 2021-03-01 MED ORDER — SODIUM CHLORIDE 0.9 % IV SOLN: 1.0000 g | Freq: Once | INTRAVENOUS | Status: DC

## 2021-03-01 MED ORDER — POTASSIUM CHLORIDE 10 MEQ/100ML IV SOLN
10.0000 meq | INTRAVENOUS | Status: AC
  Administered 2021-03-01 (×6): 10 meq via INTRAVENOUS
  Filled 2021-03-01 (×6): qty 100

## 2021-03-01 MED ORDER — INFLUENZA VAC A&B SA ADJ QUAD 0.5 ML IM PRSY
0.5000 mL | PREFILLED_SYRINGE | INTRAMUSCULAR | Status: DC | PRN
  Filled 2021-03-01: qty 0.5

## 2021-03-01 MED ORDER — IPRATROPIUM-ALBUTEROL 0.5-2.5 (3) MG/3ML IN SOLN
3.0000 mL | Freq: Two times a day (BID) | RESPIRATORY_TRACT | Status: DC
  Administered 2021-03-01: 3 mL via RESPIRATORY_TRACT
  Filled 2021-03-01 (×2): qty 3

## 2021-03-01 NOTE — ED Notes (Signed)
Entered pt's room to attempt additional labs. Upon entry pt appeared to be lightly sedated and resting comfortably. This RN placed tourniquet on pt's arm in attempt to draw blood. Pt immediately woke up and became agitated. Pt attempted to slap this RN and yelled "No, you don't need anymore blood. Just let me rest" due to combativeness, unable to obtain labs at this time.

## 2021-03-01 NOTE — Progress Notes (Signed)
NAME:  Christopher Harding MRN:  [id number] DOB:  1951/02/09 LOS: 1 ADMISSION DATE:  02/28/2021 DATE OF SERVICE:  02/28/2021  CHIEF COMPLAINT:  psychomotor agitation   HISTORY & PHYSICAL  History of Present Illness  This 70 y.o. Caucasian male reformed smoker presented to [facility] Emergency Department via EMS with complaints of increased dyspnea.  The patient was experiencing rapidly progressive insidious onset worsening of dyspnea at home (dyspnea at rest), which was associated with a panic reaction, which seemed to be a vicious cycle as described by the patient's wife.  She noted that the patient has having diarreha for the past 2-3 days and he had been having increased cough productive of clear mucous secretions.  The patient's SpO2 improved from 70% on room air to 95% after receiving albuterol 10 mg and Atrovent 0.5 mg by EMS.  He was transported to the ER on 8 LPM by nasal cannula.  However, in the ER, the patient demonstrated combative, belligerent behavior and impeded attempts to provide him care.  He was treated with doses of Zyprexa, Versed and Dilaudid without significant improvement.  He was started on Precedex infusion, which appeared to have dramatic improved the patient's temperament.    CT head was negative for pulmonary embolism and redemonstrated previously identified consolidation and bandlike density in the right upper lobe and superior segment of right lower lobe, small right-sided pleural effusion, and other punctate bilateral pulmonary nodules, along with a large metastatic lesion in the left hepatic lobe.  CT head was negative for an acute intracranial process.  Wife also reported gradually worsening dysphagia PTA     Antimicrobials:  Rocephin/azithromycin/vancomycin (9/24>>)    Micro studies: - Viral resp panel 9/24  neg flu/ covid 19  - MRSA PCR screen 9/25 neg - BC x 2  9/25 >>>  Scheduled Meds:  budesonide (PULMICORT) nebulizer solution  0.25 mg Nebulization BID    Chlorhexidine Gluconate Cloth  6 each Topical Daily   folic acid  1 mg Oral Daily   heparin  5,000 Units Subcutaneous Q8H   ipratropium-albuterol  3 mL Nebulization BID   mouth rinse  15 mL Mouth Rinse BID   metoprolol tartrate  50 mg Oral BID WC   mirtazapine  30 mg Oral QHS   morphine  30 mg Oral Q12H   pantoprazole (PROTONIX) IV  40 mg Intravenous QHS   thiamine  100 mg Oral Daily   Continuous Infusions:  dexmedetomidine (PRECEDEX) IV infusion 0.5 mcg/kg/hr (03/01/21 7829)   lactated ringers Stopped (03/01/21 0323)   potassium chloride Stopped (03/01/21 1029)   PRN Meds:.acetaminophen, docusate sodium, ondansetron (ZOFRAN) IV, polyethylene glycol, ziprasidone    Interim history/subjective:  Pt appears more chronically than acutely ill  not able to  verbalize any specific complaints on low dose predeex    Objective   BP (!) 96/53   Pulse 74   Temp (!) 97.5 F (36.4 C) (Axillary)   Resp 13   Ht 6\' 2"  (1.88 m)   Wt 58.5 kg   SpO2 100%   BMI 16.56 kg/m     Filed Weights   02/28/21 1357  Weight: 58.5 kg    Intake/Output Summary (Last 24 hours) at 03/01/2021 1018 Last data filed at 03/01/2021 0400 Gross per 24 hour  Intake 2535.32 ml  Output --  Net 2535.32 ml        Examination: Tmax  97.8  General appearance:    elderly wm does not appear toxic, calm s increaesed  wob   At Rest 02 sats  98% on RA  No jvd Oropharynx clear,  mucosa nl Neck supple Lungs with a few scattered exp > insp rhonchi bilaterally RRR no s3 or or sign murmur Abd soft, nl  excursion  Extr warm with no edema or clubbing noted Neuro  Sensorium responds to verbal but only knows name ,  no apparent motor deficits     I personally reviewed images and agree with radiology impression as follows:  CTa chest 0/24 1. No acute pulmonary embolus is seen. 2. Similar consolidation and bandlike density in the right upper lobe and superior segment of right lower lobe. Small right-sided pleural  effusion with loculation superiorly, this is stable to minimally increased in size. Other punctate bilateral pulmonary nodules are unchanged. 3. Incompletely visualized large metastatic lesion in the left hepatic lobe   Resolved Hospital Problem list      Assessment & Plan:   ASSESSMENT/PLAN:  ASSESSMENT (included in the Hospital Problem List)  Principal Problem:   Acute hypoxemic respiratory failure (HCC) Active Problems:   Essential hypertension   Adenocarcinoma, lung, right (HCC)   Cancer associated pain   Hypokalemia   Leukopenia due to antineoplastic chemotherapy (HCC)   Normocytic anemia   Psychomotor agitation   By systems: PULMONARY Acute hypoxemic respiratory failure Titrate supplemental oxygen as needed to maintain SpO2 93+% No longer requiring  02 am 9/25    CARDIOVASCULAR History of atrial fibrillation The patient's wife reports that the patient was successfully cardioverted out of atrial fibrillation 2 years ago but his Xarelto has never been stopped.   Hold Xarelto for now and just use sq hep   RENAL Dehydration Rehydrating  with LR   GASTROINTESTINAL Diarrhea Continue Imodium GI PROPHYLAXIS: Protonix   HEMATOLOGIC/ONCOLOGIC Leukopenia, likely due to chemotherapy/ note PCT nl on admit  Normocytic anemia Monitor CBC Follow up cultures Consult Oncology.  Established patient.  Due to get cycle 4 of Keytruda/Alimta/carboplatin next week. DVT PROPHYLAXIS: heparin   INFECTIOUS: No acute issues - PCT nl on admit  Follow up blood cultures, respiratory culture Continue empiric antibiotics for now, but low threshold to discontinue in the absence of compelling data to continue       NEUROLOGIC Psychomotor agitation Depression Cancer pain Continue Precedex  >>>  Titrate for RASS 0. Geodon 10 mg IM q 2 hr PRN for agitation Continue  Remeron MRI brain to evaluate for brain metastases when able Continue pain medications (home medications).  Hold  NSAIDs for now.   PLAN/RECOMMENDATIONS  As above       Best practice:  Diet: cardiac Pain/Anxiety/Delirium protocol (if indicated): YES VAP protocol (if indicated): N/A DVT prophylaxis: heparin GI prophylaxis: Protonix Glucose control: N/A Mobility/Activity: bedrest   Code Status: Full Code Family Communication:  patient's family (wife updated at bedside) Disposition: admit to ICU   Labs   CBC: Recent Labs  Lab 02/28/21 1356 03/01/21 0119 03/01/21 0538  WBC 2.0* 1.3* 1.4*  NEUTROABS 1.1*  --   --   HGB 9.8* 7.4* 7.8*  HCT 28.0* 21.3* 22.1*  MCV 89.2 90.6 89.5  PLT 162 147* 130*    Basic Metabolic Panel: Recent Labs  Lab 02/28/21 1356 03/01/21 0119 03/01/21 0538  NA 137  --  134*  K 3.0*  --  3.4*  CL 100  --  101  CO2 21*  --  22  GLUCOSE 115*  --  115*  BUN 8  --  7*  CREATININE 0.55* 0.59*  0.61  CALCIUM 9.4  --  8.6*  MG  --  1.3* 2.3  PHOS  --   --  3.4   GFR: Estimated Creatinine Clearance: 71.1 mL/min (by C-G formula based on SCr of 0.61 mg/dL). Recent Labs  Lab 02/28/21 1356 02/28/21 1440 03/01/21 0119 03/01/21 0538  PROCALCITON  --   --  <0.10  --   WBC 2.0*  --  1.3* 1.4*  LATICACIDVEN  --  3.8* 1.1  --     Liver Function Tests: Recent Labs  Lab 02/28/21 1356  AST 21  ALT 10  ALKPHOS 107  BILITOT 1.2  PROT 6.8  ALBUMIN 3.4*   No results for input(s): LIPASE, AMYLASE in the last 168 hours. No results for input(s): AMMONIA in the last 168 hours.  ABG    Component Value Date/Time   PHART 7.637 (HH) 02/28/2021 1400   PCO2ART 16.2 (LL) 02/28/2021 1400   PO2ART 257 (H) 02/28/2021 1400   HCO3 25.5 03/01/2021 0119   ACIDBASEDEF 2.1 (H) 02/28/2021 1400   O2SAT 28.0 03/01/2021 0119     Coagulation Profile: Recent Labs  Lab 02/28/21 1356  INR 1.2    Cardiac Enzymes: No results for input(s): CKTOTAL, CKMB, CKMBINDEX, TROPONINI in the last 168 hours.  HbA1C: Hgb A1c MFr Bld  Date/Time Value Ref Range Status  02/15/2017  04:39 PM 4.9 4.8 - 5.6 % Final    Comment:             Prediabetes: 5.7 - 6.4          Diabetes: >6.4          Glycemic control for adults with diabetes: <7.0     CBG: No results for input(s): GLUCAP in the last 168 hours.     The patient is critically ill with multiple organ systems failure and requires high complexity decision making for assessment and support, frequent evaluation and titration of therapies, application of advanced monitoring technologies and extensive interpretation of multiple databases. Critical Care Time devoted to patient care services described in this note is 45 minutes.   Christinia Gully, MD [facility] [place] [phone]   After 7:00 pm call Elink  [phone]

## 2021-03-01 NOTE — Progress Notes (Addendum)
eLink Physician-Brief Progress Note Patient Name: Christopher Harding DOB: 10-09-1950 MRN: [id number]   Date of Service  03/01/2021  HPI/Events of Note  Brief HPI: 70 yr old male with hx of a fib, s/p DCCV 12/2018, HTN, depression, current smoker/alcohol user  , adeno ca lung right on chemo admitted to ICU for AHRF.  Notes, labs reviewed. 7.38/42/31 VBG LA normalized from 3.8 wbc 1.3, Hg 7.4( was at 9.8),  plt 147 Covid/flu neg Mag 1.3, Cr normal. BNP low at 69, Trop 9.   Data: CT head was negative for pulmonary embolism and redemonstrated previously identified consolidation and bandlike density in the right upper lobe and superior segment of right lower lobe, small right-sided pleural effusion, and other punctate bilateral pulmonary nodules, along with a large metastatic lesion in the left hepatic lobe.  CT head was negative for an acute intracranial process.  Camera: Cachectic, HR > 65 to 70 sinus, MAP > 70. Sats > 90%. PAC's.    A/P: AHRF. On empiric abx for Pneumonia. Lung cancer right. Chemo.Leukopenic-neutropenic.  LA normalized. Neutropenic precautions .   eICU Interventions  2. Hypokalemia/hypomagnesemia.  - Mag 4 gm and potassium 80 meq oral ordered. On folic acid. Add thiamine daily .  - follow levels in AM.  3. Smoker/alcohol use/anxiety.  - watch for withdrawal. On precedex gtt. -aspiration and sz precautions.   4. Anemia/leukopenia - trend Hg, tranfuse if < 8.   5. Sq heparin as VTE prophylaxis. CBG goals < 180. Watch for low glucose level.   Discussed with bed side RN on camera.      Intervention Category Major Interventions: Respiratory failure - evaluation and management Evaluation Type: New Patient Evaluation  Elmer Sow 03/01/2021, 2:01 AM  3 AM RN noted a drop in Pt's Hgb 7.4 (was 9.8) no signs of active bleeding - follow AM Hg/CBC  Has K-dur 40 mEq PO ordered and Pt with agitation, aggressiveness since admit. RN asking for the dose can be  changed to PIV, has 2 - changed to IV Kcl. -asp precautions.   3:33 Lactic acid went up to 2.3, on Levo 69mcq with 103/56(70), Looks like she received 3 liters of LR in the ED.  UO of 550cc since she has been in the ICU. Has liver mets. Could be delayed clarence. Continue current care.   She has been sating 91% on R/A, Bedside RN asking if you really want the ABG's that are ordered for this AM or do you want to hold off for now.  - hold off. For now. If sats dips to do it as needed  5:26 Has posey west from ED. Ordered same. Remove if not showing any self harm injury behaviors.

## 2021-03-01 NOTE — ED Notes (Signed)
ED TO INPATIENT HANDOFF REPORT  ED Nurse Name and Phone #: Erick Colace, RN [phone]  S Name/Age/Gender Christopher Harding 70 y.o. male Room/Bed: WA24/WA24  Code Status   Code Status: Full Code  Home/SNF/Other Home Patient oriented to: self, place, and situation Is this baseline? Yes   Triage Complete: Triage complete  Chief Complaint Acute hypoxemic respiratory failure ([place]) [J96.01]  Triage Note Pt BIB EMS c/o SOB. Hx of lung cancer. Pt was 70% RA after neb increased to 95%. Pt received 10mg  albuterol and 0.5mg  atrovent. Pt states hes receiving weekly CA tx. Pt currently on 8LNC  BP-108/90 HR-72 RR-26 CBG-114   Allergies No Known Allergies  Level of Care/Admitting Diagnosis ED Disposition     ED Disposition  Admit   Condition  --   Comment  Hospital Area: [place] [[id number]]  Level of Care: ICU [6]  May admit patient to [facility] or [facility] if equivalent level of care is available:: No  Covid Evaluation: Confirmed COVID Negative  Diagnosis: Acute hypoxemic respiratory failure [facility]) [[id number]]  Admitting Physician: Renee Pain [[id number]]  Attending Physician: Renee Pain [[id number]]  Estimated length of stay: 3 - 4 days  Certification:: I certify this patient will need inpatient services for at least 2 midnights          B Medical/Surgery History Past Medical History:  Diagnosis Date   A-fib ([place])    s/p DCCV 12/07/18   Cancer ([place])    Depression    situational   Dysrhythmia    Hypertension    Persistent atrial fibrillation ([place]) 11/23/2018   Persistent atrial fibrillation   Tobacco abuse 11/23/2018   Past Surgical History:  Procedure Laterality Date   CARDIOVERSION N/A 12/07/2018   Procedure: CARDIOVERSION;  Surgeon: Josue Hector, MD;  Location: [place];  Service: Cardiovascular;  Laterality: N/A;   FINE NEEDLE ASPIRATION  10/30/2019   Procedure: FINE NEEDLE ASPIRATION (FNA) LINEAR;  Surgeon: Candee Furbish,  MD;  Location: [facility] ENDOSCOPY;  Service: Pulmonary;;   INGUINAL HERNIA REPAIR Right 01/25/2019   Procedure: RIGHT INGUINAL HERNIA REPAIR WITH MESH;  Surgeon: Coralie Keens, MD;  Location: [place];  Service: General;  Laterality: Right;  TAP BLOCK   INSERTION OF MESH Right 01/25/2019   Procedure: Insertion Of Mesh;  Surgeon: Coralie Keens, MD;  Location: [place];  Service: General;  Laterality: Right;   VIDEO BRONCHOSCOPY WITH ENDOBRONCHIAL ULTRASOUND N/A 10/30/2019   Procedure: VIDEO BRONCHOSCOPY WITH ENDOBRONCHIAL ULTRASOUND;  Surgeon: Candee Furbish, MD;  Location: [facility] ENDOSCOPY;  Service: Pulmonary;  Laterality: N/A;     A IV Location/Drains/Wounds Patient Lines/Drains/Airways Status     Active Line/Drains/Airways     Name Placement date Placement time Site Days   Peripheral IV 02/28/21 20 G Right Antecubital 02/28/21  1539  Antecubital  1   Peripheral IV 02/28/21 22 G Anterior;Left Forearm 02/28/21  2014  Forearm  1   Incision (Closed) 01/25/19 Abdomen Right 01/25/19  0903  -- 766   Wound / Incision (Open or Dehisced) 11/24/20 Puncture Abdomen Anterior;Mid;Upper liver biopsy puncture site  11/24/20  0846  Abdomen  97            Intake/Output Last 24 hours  Intake/Output Summary (Last 24 hours) at 03/01/2021 0056 Last data filed at 02/28/2021 2144 Gross per 24 hour  Intake 2327.18 ml  Output --  Net 2327.18 ml    Labs/Imaging Results for orders placed or performed during the hospital encounter of 02/28/21 (  from the past 48 hour(s))  Comprehensive metabolic panel     Status: Abnormal   Collection Time: 02/28/21  1:56 PM  Result Value Ref Range   Sodium 137 135 - 145 mmol/L   Potassium 3.0 (L) 3.5 - 5.1 mmol/L   Chloride 100 98 - 111 mmol/L   CO2 21 (L) 22 - 32 mmol/L   Glucose, Bld 115 (H) 70 - 99 mg/dL    Comment: Glucose reference range applies only to samples taken after fasting for at least 8 hours.   BUN 8 8 - 23 mg/dL   Creatinine, Ser 0.55 (L) 0.61 - 1.24 mg/dL    Calcium 9.4 8.9 - 10.3 mg/dL   Total Protein 6.8 6.5 - 8.1 g/dL   Albumin 3.4 (L) 3.5 - 5.0 g/dL   AST 21 15 - 41 U/L   ALT 10 0 - 44 U/L   Alkaline Phosphatase 107 38 - 126 U/L   Total Bilirubin 1.2 0.3 - 1.2 mg/dL   GFR, Estimated >60 >60 mL/min    Comment: (NOTE) Calculated using the CKD-EPI Creatinine Equation (2021)    Anion gap 16 (H) 5 - 15    Comment: Performed at [facility], [place] [address], [place], [place] [address]  CBC WITH DIFFERENTIAL     Status: Abnormal   Collection Time: 02/28/21  1:56 PM  Result Value Ref Range   WBC 2.0 (L) 4.0 - 10.5 K/uL   RBC 3.14 (L) 4.22 - 5.81 MIL/uL   Hemoglobin 9.8 (L) 13.0 - 17.0 g/dL   HCT 28.0 (L) 39.0 - 52.0 %   MCV 89.2 80.0 - 100.0 fL   MCH 31.2 26.0 - 34.0 pg   MCHC 35.0 30.0 - 36.0 g/dL   RDW 14.5 11.5 - 15.5 %   Platelets 162 150 - 400 K/uL   nRBC 0.0 0.0 - 0.2 %   Neutrophils Relative % 53 %   Neutro Abs 1.1 (L) 1.7 - 7.7 K/uL   Lymphocytes Relative 19 %   Lymphs Abs 0.4 (L) 0.7 - 4.0 K/uL   Monocytes Relative 27 %   Monocytes Absolute 0.5 0.1 - 1.0 K/uL   Eosinophils Relative 0 %   Eosinophils Absolute 0.0 0.0 - 0.5 K/uL   Basophils Relative 0 %   Basophils Absolute 0.0 0.0 - 0.1 K/uL   Immature Granulocytes 1 %   Abs Immature Granulocytes 0.02 0.00 - 0.07 K/uL    Comment: Performed at [facility], [place] [address] [place], [place] [address]  Protime-INR     Status: Abnormal   Collection Time: 02/28/21  1:56 PM  Result Value Ref Range   Prothrombin Time 15.3 (H) 11.4 - 15.2 seconds   INR 1.2 0.8 - 1.2    Comment: (NOTE) INR goal varies based on device and disease states. Performed at [facility], [place] [address] [place], [place] [address]   APTT     Status: None   Collection Time: 02/28/21  1:56 PM  Result Value Ref Range   aPTT 28 24 - 36 seconds    Comment: Performed at [facility], [place] [address], [place], [place] [address]   D-dimer, quantitative     Status: Abnormal   Collection Time: 02/28/21  1:56 PM  Result Value Ref Range   D-Dimer, Quant 1.78 (H) 0.00 - 0.50 ug/mL-FEU    Comment: (NOTE) At the manufacturer cut-off value of 0.5 g/mL FEU, this assay has a negative predictive value  of 95-100%.This assay is intended for use in conjunction with a clinical pretest probability (PTP) assessment model to exclude pulmonary embolism (PE) and deep venous thrombosis (DVT) in outpatients suspected of PE or DVT. Results should be correlated with clinical presentation. Performed at [facility], [place] [address] [place], [place] [address]   Troponin I (High Sensitivity)     Status: None   Collection Time: 02/28/21  1:56 PM  Result Value Ref Range   Troponin I (High Sensitivity) 9 <18 ng/L    Comment: (NOTE) Elevated high sensitivity troponin I (hsTnI) values and significant  changes across serial measurements may suggest ACS but many other  chronic and acute conditions are known to elevate hsTnI results.  Refer to the "Links" section for chest pain algorithms and additional  guidance. Performed at [facility], [place] [address] [place], [place] [address]   Brain natriuretic peptide     Status: None   Collection Time: 02/28/21  1:58 PM  Result Value Ref Range   B Natriuretic Peptide 69.6 0.0 - 100.0 pg/mL    Comment: Performed at [facility], [place] [address], [place], [place] [address]  Blood gas, arterial     Status: Abnormal   Collection Time: 02/28/21  2:00 PM  Result Value Ref Range   Delivery systems NRB    pH, Arterial 7.637 (HH) 7.350 - 7.450    Comment: CRITICAL RESULT CALLED TO, READ BACK BY AND VERIFIED WITH: M,PERSCOTT AT 1425 ON 02/28/21 BY A,[name]    pCO2 arterial 16.2 (LL) 32.0 - 48.0 mmHg    Comment: CRITICAL RESULT CALLED TO, READ BACK BY AND VERIFIED WITH: M,PERSCOTT AT 1425 ON 02/28/21 BY A,[name]    pO2, Arterial 257 (H) 83.0 -  108.0 mmHg   Bicarbonate 17.6 (L) 20.0 - 28.0 mmol/L   Acid-base deficit 2.1 (H) 0.0 - 2.0 mmol/L   O2 Saturation 100.0 %   Patient temperature 98.6    Collection site RIGHT BRACHIAL    Drawn by [id number]    Allens test (pass/fail) PASS PASS    Comment: Performed at [facility], [place] [address], [place], [place] [address]  Lactic acid, plasma     Status: Abnormal   Collection Time: 02/28/21  2:40 PM  Result Value Ref Range   Lactic Acid, Venous 3.8 (HH) 0.5 - 1.9 mmol/L    Comment: CRITICAL RESULT CALLED TO, READ BACK BY AND VERIFIED WITH: ANNA,BANO AT 1618 ON 02/28/21 BY A,[name] Performed at [facility], [place] [address], [place], [place] [address]   Resp Panel by RT-PCR (Flu A&B, Covid) Nasopharyngeal Swab     Status: None   Collection Time: 02/28/21  2:40 PM   Specimen: Nasopharyngeal Swab; Nasopharyngeal(NP) swabs in vial transport medium  Result Value Ref Range   SARS Coronavirus 2 by RT PCR NEGATIVE NEGATIVE    Comment: (NOTE) SARS-CoV-2 target nucleic acids are NOT DETECTED.  The SARS-CoV-2 RNA is generally detectable in upper respiratory specimens during the acute phase of infection. The lowest concentration of SARS-CoV-2 viral copies this assay can detect is 138 copies/mL. A negative result does not preclude SARS-Cov-2 infection and should not be used as the sole basis for treatment or other patient management decisions. A negative result may occur with  improper specimen collection/handling, submission of specimen other than nasopharyngeal swab, presence of viral mutation(s) within the areas targeted by this assay, and inadequate number of viral copies(<138 copies/mL). A negative result must be combined  with clinical observations, patient history, and epidemiological information. The expected result is Negative.  Fact Sheet for Patients:  EntrepreneurPulse.com.au  Fact Sheet for Healthcare Providers:   IncredibleEmployment.be  This test is no t yet approved or cleared by the [place] FDA and  has been authorized for detection and/or diagnosis of SARS-CoV-2 by FDA under an Emergency Use Authorization (EUA). This EUA will remain  in effect (meaning this test can be used) for the duration of the COVID-19 declaration under Section 564(b)(1) of the Act, 21 U.S.C.section 360bbb-3(b)(1), unless the authorization is terminated  or revoked sooner.       Influenza A by PCR NEGATIVE NEGATIVE   Influenza B by PCR NEGATIVE NEGATIVE    Comment: (NOTE) The Xpert Xpress SARS-CoV-2/FLU/RSV plus assay is intended as an aid in the diagnosis of influenza from Nasopharyngeal swab specimens and should not be used as a sole basis for treatment. Nasal washings and aspirates are unacceptable for Xpert Xpress SARS-CoV-2/FLU/RSV testing.  Fact Sheet for Patients: EntrepreneurPulse.com.au  Fact Sheet for Healthcare Providers: IncredibleEmployment.be  This test is not yet approved or cleared by the [place] FDA and has been authorized for detection and/or diagnosis of SARS-CoV-2 by FDA under an Emergency Use Authorization (EUA). This EUA will remain in effect (meaning this test can be used) for the duration of the COVID-19 declaration under Section 564(b)(1) of the Act, 21 U.S.C. section 360bbb-3(b)(1), unless the authorization is terminated or revoked.  Performed at [facility], [place] [address] [place], [place] [address]   Troponin I (High Sensitivity)     Status: None   Collection Time: 02/28/21  3:43 PM  Result Value Ref Range   Troponin I (High Sensitivity) 9 <18 ng/L    Comment: (NOTE) Elevated high sensitivity troponin I (hsTnI) values and significant  changes across serial measurements may suggest ACS but many other  chronic and acute conditions are known to elevate hsTnI results.  Refer to the "Links"  section for chest pain algorithms and additional  guidance. Performed at [facility], [place] [address], [place], [place] [address]    CT HEAD W & WO CONTRAST (5MM)  Result Date: 02/28/2021 CLINICAL DATA:  History of lung cancer, altered EXAM: CT HEAD WITHOUT AND WITH CONTRAST TECHNIQUE: Contiguous axial images were obtained from the base of the skull through the vertex without and with intravenous contrast CONTRAST:  62mL OMNIPAQUE IOHEXOL 350 MG/ML SOLN COMPARISON:  MRI 12/09/2020 FINDINGS: Brain: No acute territorial infarction, hemorrhage, or intracranial mass is visualized. Mild atrophy and chronic small vessel ischemic changes of the white matter. Stable ventricle size. No abnormal focal contrast enhancement within the brain parenchyma. Vascular: No hyperdense vessels. 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 Skull: Normal. Negative for fracture or focal lesion. Sinuses/Orbits: No acute finding. Other: None IMPRESSION: 1. No CT evidence for acute intracranial abnormality. Atrophy and chronic small-vessel ischemic changes of the white matter. Negative for enhancing mass lesion. 2. 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 Electronically Signed   By: Donavan Foil M.D.   On: 02/28/2021 21:59   CT Angio Chest PE W/Cm &/Or Wo Cm  Result Date: 02/28/2021 CLINICAL DATA:  History of lung cancer hypoxia altered EXAM: CT ANGIOGRAPHY CHEST WITH CONTRAST TECHNIQUE: Multidetector CT imaging of the chest was performed using the standard protocol during bolus administration of intravenous contrast. Multiplanar CT image reconstructions and MIPs were obtained to evaluate the vascular anatomy. CONTRAST:  20mL OMNIPAQUE  IOHEXOL 350 MG/ML SOLN COMPARISON:  Chest x-ray 02/28/2021, CT chest 01/12/2021, 11/06/2020, 08/08/2020, PET CT 11/12/2018 FINDINGS: Cardiovascular: Satisfactory opacification of the pulmonary arteries to the segmental level. No evidence of pulmonary embolism. Moderate  aortic atherosclerosis. No aneurysm. Coronary vascular calcification. Normal cardiac size. No pericardial effusion Mediastinum/Nodes: Midline trachea. No thyroid. No increasing adenopathy. Esophagus within normal limits. Lungs/Pleura: Small right-sided pleural effusion with loculation at the apex is stable to minimally increased in size. Bandlike consolidation in the right upper lobe and superior segment of right lower lobe grossly similar in appearance. 4 mm left upper lobe lung nodule, series 4, image 58, previously 4 mm. Stable densely calcified nodule in the right lung base. Stable 3 mm left posterior subpleural lung nodule series 4, image 18. Stable 3 mm right subpleural lower lobe lung nodule, series 4, image 115. Upper Abdomen: Incompletely visualized large hepatic metastatic lesion in the left hepatic lobe. Musculoskeletal: No acute or suspicious osseous abnormality Review of the MIP images confirms the above findings. IMPRESSION: 1. No acute pulmonary embolus is seen. 2. Similar consolidation and bandlike density in the right upper lobe and superior segment of right lower lobe. Small right-sided pleural effusion with loculation superiorly, this is stable to minimally increased in size. Other punctate bilateral pulmonary nodules are unchanged. 3. Incompletely visualized large metastatic lesion in the left hepatic lobe Aortic Atherosclerosis (ICD10-I70.0). Electronically Signed   By: Donavan Foil M.D.   On: 02/28/2021 21:51   DG Chest Port 1 View  Result Date: 02/28/2021 CLINICAL DATA:  Questionable sepsis - evaluate for abnormality history lung cancer EXAM: PORTABLE CHEST 1 VIEW COMPARISON:  Oct 30, 2019, November 06, 2020 FINDINGS: The cardiomediastinal silhouette is unchanged in contour.Revisualization an irregular contour of the RIGHT upper lung along the mediastinal border, similar in comparison to prior. Asymmetric RIGHT apical pleural thickening, unchanged nodular opacity overlying the RIGHT  costophrenic angle, unchanged and consistent with a calcified granuloma. No pleural effusion. No pneumothorax. No acute pleuroparenchymal abnormality. Visualized abdomen is unremarkable. IMPRESSION: No new focal consolidation. Electronically Signed   By: Valentino Saxon M.D.   On: 02/28/2021 14:09    Pending Labs Unresulted Labs (From admission, onward)     Start     Ordered   03/01/21 0500  CBC  Tomorrow morning,   R        02/28/21 2338   03/01/21 9233  Basic metabolic panel  Tomorrow morning,   R        02/28/21 2338   03/01/21 0500  Magnesium  Tomorrow morning,   R        02/28/21 2338   03/01/21 0500  Phosphorus  Tomorrow morning,   R        02/28/21 2338   02/28/21 2336  Magnesium  ONCE - STAT,   STAT        02/28/21 2338   02/28/21 2335  Procalcitonin  Once,   STAT        02/28/21 2338   02/28/21 2335  Culture, blood (routine x 2)  BLOOD CULTURE X 2,   R (with STAT occurrences)     Question:  Patient immune status  Answer:  Immunocompromised   02/28/21 2338   02/28/21 2335  Urinalysis, Routine w reflex microscopic Urine, Clean Catch  Once,   STAT        02/28/21 2338   02/28/21 2335  Expectorated Sputum Assessment w Gram Stain, Rflx to Resp Cult  Once,   R  Question:  Patient immune status  Answer:  Immunocompromised   02/28/21 2338   02/28/21 2333  HIV Antibody (routine testing w rflx)  (HIV Antibody (Routine testing w reflex) panel)  Once,   STAT        02/28/21 2338   02/28/21 2333  CBC  (heparin)  Once,   STAT       Comments: Baseline for heparin therapy IF NOT ALREADY DRAWN.  Notify MD if PLT < 100 K.    02/28/21 2338   02/28/21 2333  Creatinine, serum  (heparin)  Once,   STAT       Comments: Baseline for heparin therapy IF NOT ALREADY DRAWN.    02/28/21 2338   02/28/21 1750  Blood gas, venous  Once,   STAT        02/28/21 1749   02/28/21 1351  Lactic acid, plasma  (Undifferentiated presentation (screening labs and basic nursing orders))  Now then every 2  hours,   STAT      02/28/21 1351   02/28/21 1351  Blood culture (routine single)  (Undifferentiated presentation (screening labs and basic nursing orders))  ONCE - STAT,   STAT        02/28/21 1351   02/28/21 1351  Urinalysis, Routine w reflex microscopic  (Undifferentiated presentation (screening labs and basic nursing orders))  ONCE - STAT,   STAT        02/28/21 1351   02/28/21 1351  Urine Culture  (Undifferentiated presentation (screening labs and basic nursing orders))  ONCE - STAT,   STAT       Question:  Indication  Answer:  Sepsis   02/28/21 1351            Vitals/Pain Today's Vitals   02/28/21 2230 02/28/21 2300 02/28/21 2330 03/01/21 0036  BP: 99/86 (!) 83/63 94/61 95/60   Pulse: 76 (!) 57 60 65  Resp: 16 14 17 20   Temp:    97.8 F (36.6 C)  TempSrc:    Oral  SpO2: 100% 100% 100% 100%  Weight:      Height:        Isolation Precautions Airborne and Contact precautions  Medications Medications  dexmedetomidine (PRECEDEX) 200 MCG/50ML (4 mcg/mL) infusion (0.4 mcg/kg/hr  58.5 kg Intravenous Rate/Dose Change 2/95/62 1308)  folic acid (FOLVITE) tablet 1 mg (has no administration in time range)  metoprolol tartrate (LOPRESSOR) tablet 50 mg (has no administration in time range)  mirtazapine (REMERON) tablet 30 mg (has no administration in time range)  morphine (MS CONTIN) 12 hr tablet 30 mg (has no administration in time range)  oxyCODONE (Oxy IR/ROXICODONE) immediate release tablet 5 mg (has no administration in time range)  docusate sodium (COLACE) capsule 100 mg (has no administration in time range)  polyethylene glycol (MIRALAX / GLYCOLAX) packet 17 g (has no administration in time range)  heparin injection 5,000 Units (has no administration in time range)  lactated ringers infusion (has no administration in time range)  acetaminophen (TYLENOL) tablet 650 mg (has no administration in time range)  ondansetron (ZOFRAN) injection 4 mg (has no administration in time  range)  pantoprazole (PROTONIX) injection 40 mg (has no administration in time range)  ipratropium-albuterol (DUONEB) 0.5-2.5 (3) MG/3ML nebulizer solution 3 mL (has no administration in time range)  budesonide (PULMICORT) nebulizer solution 0.25 mg (has no administration in time range)  ziprasidone (GEODON) injection 20 mg (has no administration in time range)  iohexol (OMNIPAQUE) 350 MG/ML injection 80 mL (80 mLs Intravenous Contrast  Given 02/28/21 2057)  OLANZapine (ZYPREXA) injection 5 mg (5 mg Intramuscular Given 02/28/21 1517)  sodium chloride (PF) 0.9 % injection (  Given 02/28/21 1523)  midazolam (VERSED) injection 4 mg (4 mg Intravenous Given 02/28/21 1550)  OLANZapine (ZYPREXA) injection 5 mg (5 mg Intramuscular Given 02/28/21 1653)  sodium chloride 0.9 % bolus 1,755 mL (0 mLs Intravenous Stopped 02/28/21 2144)  HYDROmorphone (DILAUDID) injection 1 mg (1 mg Intravenous Given 02/28/21 1754)  ondansetron (ZOFRAN) injection 4 mg (4 mg Intravenous Given 02/28/21 1755)  cefTRIAXone (ROCEPHIN) 1 g in sodium chloride 0.9 % 100 mL IVPB (0 g Intravenous Stopped 02/28/21 2041)  azithromycin (ZITHROMAX) 500 mg in sodium chloride 0.9 % 250 mL IVPB (0 mg Intravenous Stopped 02/28/21 2144)  vancomycin (VANCOCIN) IVPB 1000 mg/200 mL premix (0 mg Intravenous Stopped 02/28/21 2144)    Mobility walks with device High fall risk   Focused Assessments    R Recommendations: See Admitting Provider Note  Report given to:   Additional Notes:

## 2021-03-01 NOTE — Consult Note (Signed)
Consultation Note Date: 03/01/2021   Patient Name: Christopher Harding  DOB: [birth year]  MRN: [id number]  Age / Sex: 70 y.o., male  PCP: Lujean Amel, MD Referring Physician: Renee Pain, MD  Reason for Consultation: Establishing goals of care  HPI/Patient Profile: 70 y.o. male  with past medical history of a-fib, adenocarcinoma, anemia admitted on 02/28/2021 with dyspnea.  It got progressively worse at home and he was brought to the hospital with what was likely an associated panic reaction.  She reports he was having diarrhea 2 to 3 days prior to admission and also having a cough with clear mucus secretions.  His sats were 70% on room air when first checked.  He came to the hospital and was transferred on 8 L nasal cannula.  He continued to demonstrate combative and belligerent behavior and was transitioned ICU and put on Precedex infusion.  He is resting comfortably at time of my encounter.  Palliative consulted for goals of care.  Clinical Assessment and Goals of Care: Palliative care consult received.  Chart reviewed including personal review of pertinent labs and imaging.  I saw and examined Mr. Mazzocco.  He was resting comfortably and opened his eyes but did not otherwise interact.  I met with his wife, Cinda Quest, at the bedside.  She tells me that Mr. Corona is a very independent man.  The things that are most important to him are being at home, his children, and being outdoors.  He works as an Chief Financial Officer and is working on "one last project" currently.  We reviewed a MOST form and discussed how to develop plan of care to focus on continuing therapies that would maximize chance of being well enough to return home and limiting therapies not in line with this goal.  We discussed heroic interventions at the end-of-life. There is agreement this would not be in line with prior expressed wishes for a natural  death or be likely to lead to getting well enough to go back home. Family in agreement with changing CODE STATUS to DO NOT RESUSCITATE.  We completed MOST form today.  DNR, Limited additional interventions, IVF and ABX if indicated, no feeding tube.  Overall, his wife reports that he would want to be at home.  He would want to continue with reasonable interventions if there is a high likelihood that continued disease modifying therapy would add time and quality to his life.  However, if it is not going to add time and quality to his life, he would likely want to work to transition home with best supportive care and focusing on enjoying what ever time he has left with his family.  His wife like to have oncology weigh in tomorrow regarding their thoughts on his overall situation.  SUMMARY OF RECOMMENDATIONS  - DNR/DNI- OK with escalation to Bipap, but no CPR, mechanical ventilation, or overly aggressive interventions - Wife reports that he would want to be at home, and if he is not going to improve, they Await input from oncology  Code Status/Advance  Care Planning: DNR  Prognosis:  Unable to determine  Discharge Planning: To Be Determined      Primary Diagnoses: Present on Admission:  (Resolved) Persistent atrial fibrillation (HCC)  Essential hypertension  (Resolved) Tobacco abuse  Adenocarcinoma, lung, right (HCC)  Cancer associated pain  Acute hypoxemic respiratory failure (HCC)  Hypokalemia  Leukopenia due to antineoplastic chemotherapy (HCC)  Normocytic anemia  Psychomotor agitation   I have reviewed the medical record, interviewed the patient and family, and examined the patient. The following aspects are pertinent.  Past Medical History:  Diagnosis Date   A-fib [facility])    s/p DCCV 12/07/18   Cancer (HCC)    Depression    situational   Dysrhythmia    Hypertension    Persistent atrial fibrillation (HCC) 11/23/2018   Persistent atrial fibrillation   Tobacco abuse 11/23/2018    Social History   Socioeconomic History   Marital status: Married    Spouse name: Not on file   Number of children: 2   Years of education: Not on file   Highest education level: Not on file  Occupational History   Occupation: retired  Tobacco Use   Smoking status: Former    Packs/day: 0.50    Years: 45.00    Pack years: 22.50    Types: Cigarettes   Smokeless tobacco: Never  Vaping Use   Vaping Use: Never used  Substance and Sexual Activity   Alcohol use: Yes    Alcohol/week: 50.0 standard drinks    Types: 50 Cans of beer per week   Drug use: No   Sexual activity: Not on file  Other Topics Concern   Not on file  Social History Narrative   Not on file   Social Determinants of Health   Financial Resource Strain: Not on file  Food Insecurity: Not on file  Transportation Needs: Not on file  Physical Activity: Not on file  Stress: Not on file  Social Connections: Not on file   Family History  Problem Relation Age of Onset   Dementia Mother    Alzheimer's disease Mother    Hypertension Mother    Stroke Father    Clotting disorder Father    Esophageal cancer Neg Hx    Stomach cancer Neg Hx    Rectal cancer Neg Hx    Colon polyps Neg Hx    Scheduled Meds:  budesonide (PULMICORT) nebulizer solution  0.25 mg Nebulization BID   Chlorhexidine Gluconate Cloth  6 each Topical Daily   heparin  5,000 Units Subcutaneous Q8H   ipratropium-albuterol  3 mL Nebulization BID   mouth rinse  15 mL Mouth Rinse BID   pantoprazole (PROTONIX) IV  40 mg Intravenous QHS   Continuous Infusions:  azithromycin Stopped (03/01/21 1446)   cefTRIAXone (ROCEPHIN)  IV Stopped (03/01/21 1252)   dexmedetomidine (PRECEDEX) IV infusion 0.3 mcg/kg/hr (03/01/21 2027)   lactated ringers 75 mL/hr at 03/01/21 1900   PRN Meds:.[START ON 03/02/2021] influenza vaccine adjuvanted, metoprolol tartrate, ondansetron (ZOFRAN) IV, polyethylene glycol, ziprasidone Medications Prior to Admission:  Prior to  Admission medications   Medication Sig Start Date End Date Taking? Authorizing Provider  folic acid (FOLVITE) 1 MG tablet Take 1 tablet (1 mg total) by mouth daily. 01/15/21  Yes Si Gaul, MD  metoprolol tartrate (LOPRESSOR) 50 MG tablet Take 50 mg by mouth 2 (two) times daily with a meal. 09/14/18  Yes [provider]  mirtazapine (REMERON) 30 MG tablet TAKE 1 TABLET BY MOUTH AT BEDTIME. 12/22/20  Yes Curt Bears, MD  morphine (MS CONTIN) 30 MG 12 hr tablet Take 1 tablet (30 mg total) by mouth every 12 (twelve) hours. 02/02/21  Yes Curt Bears, MD  polyethylene glycol (MIRALAX / GLYCOLAX) 17 g packet Take 17 g by mouth daily.   Yes [provider]  prochlorperazine (COMPAZINE) 10 MG tablet Take 1 tablet (10 mg total) by mouth every 6 (six) hours as needed for nausea or vomiting. 01/15/21  Yes Curt Bears, MD  XARELTO 20 MG TABS tablet Take 20 mg by mouth daily. 09/08/18  Yes [provider]  megestrol (MEGACE ES) 625 MG/5ML suspension Take 5 mLs (625 mg total) by mouth daily. Patient not taking: Reported on 03/01/2021 01/28/21   Curt Bears, MD  oxyCODONE (OXY IR/ROXICODONE) 5 MG immediate release tablet Take 1 tablet (5 mg total) by mouth 3 (three) times daily as needed for severe pain. Patient not taking: Reported on 03/01/2021 01/12/21   Curt Bears, MD   No Known Allergies Review of Systems Unable to complete  Physical Exam General: Alert, awake, in no acute distress.   HEENT: No bruits, no goiter, no JVD Heart: Regular rate and rhythm. No murmur appreciated. Lungs: Fair air movement, scattered coarse Abdomen: Soft, nontender, nondistended, positive bowel sounds.   Ext: No significant edema Skin: Warm and dry   Vital Signs: BP 130/86   Pulse (!) 50   Temp 97.7 F (36.5 C) (Axillary)   Resp 14   Ht $R'6\' 2"'Nv$  (1.88 m)   Wt 58.5 kg   SpO2 100%   BMI 16.56 kg/m  Pain Scale: CPOT       SpO2: SpO2: 100 % O2 Device:SpO2: 100 % O2  Flow Rate: .   IO: Intake/output summary:  Intake/Output Summary (Last 24 hours) at 03/01/2021 2148 Last data filed at 03/01/2021 1900 Gross per 24 hour  Intake 1592.05 ml  Output 450 ml  Net 1142.05 ml    LBM: Last BM Date:  (PTA) Baseline Weight: Weight: 58.5 kg Most recent weight: Weight: 58.5 kg     Palliative Assessment/Data:   Flowsheet Rows    Flowsheet Row Most Recent Value  Intake Tab   Referral Department Critical care  Unit at Time of Referral ICU  Palliative Care Primary Diagnosis Cancer  Date Notified 03/01/21  Palliative Care Type New Palliative care  Reason for referral Clarify Goals of Care  Date of Admission 02/28/21  Date first seen by Palliative Care 03/01/21  # of days Palliative referral response time 0 Day(s)  # of days IP prior to Palliative referral 1  Clinical Assessment   Palliative Performance Scale Score 60%  Psychosocial & Spiritual Assessment   Palliative Care Outcomes   Patient/Family meeting held? Yes  Who was at the meeting? Wife  Palliative Care Outcomes Clarified goals of care       Time In: 1800 Time Out: 1900  Time Total: 60 Greater than 50%  of this time was spent counseling and coordinating care related to the above assessment and plan.  Signed by: Micheline Rough, MD   Please contact Palliative Medicine Team phone at [phone] for questions and concerns.  For individual provider: See Shea Evans

## 2021-03-01 NOTE — Progress Notes (Signed)
Discussed with wife, wants NCB but not ready for hospice yet.  Palliative care consult pending   She would still be ok with 02/ bipap but no escalation beyond that for now  Christinia Gully, MD Pulmonary and [place] [phone]   After 7:00 pm call Elink  [phone]

## 2021-03-02 ENCOUNTER — Telehealth: Payer: Self-pay | Admitting: Medical Oncology

## 2021-03-02 DIAGNOSIS — Z7189 Other specified counseling: Secondary | ICD-10-CM | POA: Diagnosis not present

## 2021-03-02 DIAGNOSIS — D701 Agranulocytosis secondary to cancer chemotherapy: Secondary | ICD-10-CM

## 2021-03-02 DIAGNOSIS — J9601 Acute respiratory failure with hypoxia: Secondary | ICD-10-CM

## 2021-03-02 DIAGNOSIS — T451X5A Adverse effect of antineoplastic and immunosuppressive drugs, initial encounter: Secondary | ICD-10-CM

## 2021-03-02 DIAGNOSIS — C3491 Malignant neoplasm of unspecified part of right bronchus or lung: Secondary | ICD-10-CM | POA: Diagnosis not present

## 2021-03-02 LAB — DIFFERENTIAL
Abs Immature Granulocytes: 0.01 10*3/uL (ref 0.00–0.07)
Basophils Absolute: 0 10*3/uL (ref 0.0–0.1)
Basophils Relative: 0 %
Eosinophils Absolute: 0 10*3/uL (ref 0.0–0.5)
Eosinophils Relative: 0 %
Immature Granulocytes: 1 %
Lymphocytes Relative: 22 %
Lymphs Abs: 0.3 10*3/uL — ABNORMAL LOW (ref 0.7–4.0)
Monocytes Absolute: 0.5 10*3/uL (ref 0.1–1.0)
Monocytes Relative: 40 %
Neutro Abs: 0.4 10*3/uL — CL (ref 1.7–7.7)
Neutrophils Relative %: 37 %

## 2021-03-02 LAB — BASIC METABOLIC PANEL
Anion gap: 12 (ref 5–15)
BUN: 7 mg/dL — ABNORMAL LOW (ref 8–23)
CO2: 17 mmol/L — ABNORMAL LOW (ref 22–32)
Calcium: 8.4 mg/dL — ABNORMAL LOW (ref 8.9–10.3)
Chloride: 105 mmol/L (ref 98–111)
Creatinine, Ser: 0.63 mg/dL (ref 0.61–1.24)
GFR, Estimated: >60 mL/min (ref >60)
Glucose, Bld: 84 mg/dL (ref 70–99)
Potassium: 4.1 mmol/L (ref 3.5–5.1)
Sodium: 134 mmol/L — ABNORMAL LOW (ref 135–145)

## 2021-03-02 LAB — URINE CULTURE: Culture: NO GROWTH

## 2021-03-02 MED ORDER — FOLIC ACID 1 MG PO TABS
1.0000 mg | ORAL_TABLET | Freq: Every day | ORAL | Status: DC
  Administered 2021-03-02 – 2021-03-03 (×2): 1 mg via ORAL
  Filled 2021-03-02 (×2): qty 1

## 2021-03-02 MED ORDER — THIAMINE HCL 100 MG PO TABS
100.0000 mg | ORAL_TABLET | Freq: Every day | ORAL | Status: DC
  Administered 2021-03-02 – 2021-03-03 (×2): 100 mg via ORAL
  Filled 2021-03-02 (×2): qty 1

## 2021-03-02 MED ORDER — HALOPERIDOL LACTATE 5 MG/ML IJ SOLN: 5.0000 mg | Freq: Four times a day (QID) | INTRAMUSCULAR | Status: DC | PRN

## 2021-03-02 MED ORDER — AMIODARONE IV BOLUS ONLY 150 MG/100ML
150.0000 mg | Freq: Once | INTRAVENOUS | Status: AC
  Administered 2021-03-02: 150 mg via INTRAVENOUS
  Filled 2021-03-02: qty 100

## 2021-03-02 MED ORDER — RIVAROXABAN 20 MG PO TABS
20.0000 mg | ORAL_TABLET | Freq: Every day | ORAL | Status: DC
  Administered 2021-03-02: 20 mg via ORAL
  Filled 2021-03-02: qty 1

## 2021-03-02 MED ORDER — MEGESTROL ACETATE 400 MG/10ML PO SUSP
300.0000 mg | Freq: Two times a day (BID) | ORAL | Status: DC
  Filled 2021-03-02 (×3): qty 10

## 2021-03-02 MED ORDER — BUDESONIDE 0.5 MG/2ML IN SUSP
0.5000 mg | Freq: Two times a day (BID) | RESPIRATORY_TRACT | Status: DC
  Administered 2021-03-03: 0.5 mg via RESPIRATORY_TRACT
  Filled 2021-03-02 (×2): qty 2

## 2021-03-02 MED ORDER — TAMSULOSIN HCL 0.4 MG PO CAPS
0.4000 mg | ORAL_CAPSULE | Freq: Every day | ORAL | Status: DC
  Administered 2021-03-02 – 2021-03-03 (×2): 0.4 mg via ORAL
  Filled 2021-03-02 (×2): qty 1

## 2021-03-02 MED ORDER — LACTATED RINGERS IV BOLUS
500.0000 mL | Freq: Once | INTRAVENOUS | Status: AC
  Administered 2021-03-02: 500 mL via INTRAVENOUS

## 2021-03-02 MED ORDER — ADULT MULTIVITAMIN W/MINERALS CH
1.0000 | ORAL_TABLET | Freq: Every day | ORAL | Status: DC
  Filled 2021-03-02 (×2): qty 1

## 2021-03-02 MED ORDER — METOPROLOL TARTRATE 50 MG PO TABS
50.0000 mg | ORAL_TABLET | Freq: Two times a day (BID) | ORAL | Status: DC
  Administered 2021-03-02 – 2021-03-03 (×3): 50 mg via ORAL
  Filled 2021-03-02: qty 1
  Filled 2021-03-02: qty 2
  Filled 2021-03-02: qty 1

## 2021-03-02 MED ORDER — MORPHINE SULFATE ER 30 MG PO TBCR
30.0000 mg | EXTENDED_RELEASE_TABLET | Freq: Two times a day (BID) | ORAL | Status: DC
  Administered 2021-03-02 – 2021-03-03 (×3): 30 mg via ORAL
  Filled 2021-03-02 (×3): qty 1

## 2021-03-02 MED ORDER — PROSOURCE PLUS PO LIQD
30.0000 mL | Freq: Two times a day (BID) | ORAL | Status: DC
  Filled 2021-03-02 (×2): qty 30

## 2021-03-02 MED ORDER — TBO-FILGRASTIM 300 MCG/0.5ML ~~LOC~~ SOSY
300.0000 ug | PREFILLED_SYRINGE | Freq: Every day | SUBCUTANEOUS | Status: DC
  Administered 2021-03-02: 300 ug via SUBCUTANEOUS
  Filled 2021-03-02: qty 0.5

## 2021-03-02 NOTE — Progress Notes (Signed)
Nutrition Follow-up  DOCUMENTATION CODES:   Underweight  INTERVENTION:  - will order [employer] Breakfast with 2% milk for breakfast meals, each supplement provides 200 kcal and 13 grams protein. - will order Magic Cup BID with meals, each supplement provides 290 kcal and 9 grams of protein. - will order 30 ml Prosource Plus BID, each supplement provides 100 kcal and 15 grams protein.  - will order 1 tablet multivitamin with minerals/day. - liberalize diet from Heart Healthy to Regular (approved by MD). - complete NFPE when feasible.    NUTRITION DIAGNOSIS:   Increased nutrient needs related to acute illness as evidenced by estimated needs.  GOAL:   Patient will meet greater than or equal to 90% of their needs  MONITOR:   PO intake, Supplement acceptance, Labs, Weight trends  REASON FOR ASSESSMENT:   Malnutrition Screening Tool  ASSESSMENT:   69 y.o.  male who is a previous smoker and has medical history of afib, HTN, depression, and cancer. He presented to the ED due to increased dyspnea, diarrhea x2-3 days, and cough productive of clear sputum. In the ED he was combative, belligerent, and impeded attempts to provide care. He was started on precedex drip d/t this. CT head negative for pulmonary embolism but showed density in RUL and RLL, small R-sided pleural effusion, and bilateral pulmonary nodules and larger metastatic lesion in the L hepatic lobe.  No intakes documented. Diet advanced from NPO to Heart Healthy this AM. Patient discussed in rounds this AM. He was laying in bed at the time of visit with no family or visitors at bedside. Patient agreeable to conversation with RD but declined NFPE.   Breakfast tray at bedside but patient reports not eating anything yet today.   He reports that appetite began to decline a year and 6 months to a year and 8 months ago. He reports being dx with cancer 1 year ago and that since that time appetite has drastically declined. He  is currently ordered megace BID but states that he did not take any such medication PTA.   He denies any chewing or swallowing issues. He denies any abdominal pain or nausea today. States that he no longer has an interest in eating and even one time favorite foods are not appealing to him.  He reports that 1.5 years ago he weighed 170-175 lb. He states that he is now 135 lb. He occasionally weighs himself at home. Weight today and on 8/24 documented as 132 lb and weight on 6/24 was 152 lb. This indicates 20 lb weight loss (13% body weight) in the past 3 months; significant for time frame.  Patient likely meets criteria for severe malnutrition based on weight loss and cachectic appearance, but unable to confirm without completing NFPE.  Patient is DNR/DNI, ok with escalation to BiPAP but no other escalation of care.   Labs reviewed; Na: 134 mmol/l, BUN: 7 mg/dl, Ca: 8.4 mg/dl. Medications reviewed; 1 mg folvite/day, 300 mg megace BID, 100 mg oral thiamine/day.    NUTRITION - FOCUSED PHYSICAL EXAM:  Patient refused.  Diet Order:   Diet Order             Diet regular Room service appropriate? Yes; Fluid consistency: Thin  Diet effective now                   EDUCATION NEEDS:   Not appropriate for education at this time  Skin:  Skin Assessment: Reviewed RN Assessment  Last BM:  9/26 (  type 6 x1)  Height:   Ht Readings from Last 1 Encounters:  02/28/21 6\' 2"  (1.88 m)    Weight:   Wt Readings from Last 1 Encounters:  03/02/21 59.9 kg     Estimated Nutritional Needs:  Kcal:  2100-2300 kcal Protein:  105-120 grams Fluid:  >/= 2.3 L/day      Jarome Matin, MS, RD, LDN, CNSC Inpatient Clinical Dietitian RD pager # available in AMION  After hours/weekend pager # available in [facility]

## 2021-03-02 NOTE — Telephone Encounter (Signed)
pt admitted ICU on 09/24/ for "disorientation . mild pneumonia and sepsis, and he is better today" .  Next appt 09/28-I asked wife to update [place] tomorrow and we can r/s pt.  Palliative Care vs Hospice Wife asking if it is time to bring in Palliative care or Hospice. I sent message for Mikey Bussing NP to see pt.

## 2021-03-02 NOTE — Progress Notes (Addendum)
NAME:  Christopher Harding MRN:  [id number] DOB:  1951/02/25 LOS: 2 ADMISSION DATE:  02/28/2021 DATE OF SERVICE:  02/28/2021 CHIEF COMPLAINT:  psychomotor agitation   Brief HPI  15 yoM presented to [facility] ED on 9/24 with complaints of progressive dyspnea with panic reaction with productive cough of clear mucous, 2-3 days of diarrhea, and worsening dysphagia.  Found to be hypoxic at 70% on room air improved with oxygen and nebs.  Became combative and belligerent in ER without significant improvement despite doses of Zyprexa, Versed and Dilaudid; therefore placed on precedex gtt with significant improvement.   CT head was negative for pulmonary embolism and redemonstrated previously identified consolidation and bandlike density in the right upper lobe and superior segment of right lower lobe, small right-sided pleural effusion, and other punctate bilateral pulmonary nodules, along with a large metastatic lesion in the left hepatic lobe.  CT head was negative for an acute intracranial process.  Admitted to PCCM.   Past Medical History   Tobacco abuse, ETOH use, Afib, depression, HTN, metastatic (liver) adenocarcinoma lung cancer (currently on systemic palliative carboplatin, alimta, and keytruda)  Antimicrobials:  9/24 ceftriaxone> 9/24 azthro > 9/24 vanc  Micro studies: - Viral resp panel 9/24  neg flu/ covid 19  - MRSA PCR screen 9/25 neg - BC x 2  9/25 > ngtd - UC 9/25 > neg  9/24  Admitted PCCM, on precedex 9/25  Palliative consult-> DNI/ DNR except BiPAP 9/26  afib w/RVR in am.  Off precedex.  Tx to telemetry and to [facility] 9/27   Interim history/subjective:  - Episode of Afib with RVR overnight with hypotension s/p amio bolus and LR 1L bolus, since paroxymal but controlled rate  - Off precedex since 1am - Pt alert and oriented today, complains of pain with voiding and didn't sleep well - Afebrile   Objective   BP (!) 164/78   Pulse 72   Temp 97.7 F (36.5 C) (Axillary)   Resp (!) 21    Ht 6\' 2"  (1.88 m)   Wt 59.9 kg   SpO2 99%   BMI 16.95 kg/m     Filed Weights   02/28/21 1357 03/02/21 0500  Weight: 58.5 kg 59.9 kg    Intake/Output Summary (Last 24 hours) at 03/02/2021 1114 Last data filed at 03/02/2021 1000 Gross per 24 hour  Intake 3483.51 ml  Output 800 ml  Net 2683.51 ml      Examination: General:  Older thin male lying in bed in NAD HEENT: MM pink/moist Neuro:  Awake, oriented x 3, MAE CV: IRIR, afib on monitor - controlled rate PULM:  non labored, clear anteriorly, on room air  GI: soft, bs+, condom cath Extremities: cachetic, warm/dry, no LE edema   UOP 800 ml/ 24hrs (0.6 ml/kg/hr) +2.2L/ net +4.8L  Labs reviewed:  K 4.1, bicarb 17, stable renal function, WBC 1.4-> 1.3, Hgb 7.3-> 9.3, plt 130-> 156   CTa chest 0/24 1. No acute pulmonary embolus is seen. 2. Similar consolidation and bandlike density in the right upper lobe and superior segment of right lower lobe. Small right-sided pleural effusion with loculation superiorly, this is stable to minimally increased in size. Other punctate bilateral pulmonary nodules are unchanged. 3. Incompletely visualized large metastatic lesion in the left hepatic lobe  [facility] 9/24  1. No CT evidence for acute intracranial abnormality. Atrophy and chronic small-vessel ischemic changes of the white matter. Negative for enhancing mass lesion. 2. 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  Resolved Hospital Problem list    Assessment & Plan:   Acute hypoxic respiratory failure  Metastatic adenocarcinoma of lung - on room air.  Supplemental O2 prn.  Stop budesonide neb - pending oncology consult.  Due to get cycle 4 of Keytruda., Alimta, and carboplatin next week. - pending SLP evaluation w/ complaints of dysphagia.  Of note, questionable subcarinal lymphadenopathy versus esophageal hypermetabolism on PET 11/11/20; underwent GI eval with EGD on 01/30/2020 which showed no mass lesion or malignancy in that area  -  Palliative care following, changed to DNR/ DNI except BiPAP on 9/25, no aggressive measures    Paroxsymal Afib with RVR on home Xarelto -  RVR resolved with amiodarone bolus  - continue telemetry monitoring - resume home Xarelto  - keep K > 4, Mag >2 - resume home lopressor with holding parameters   Leukopenia in the immunosuppressed patient - neutropenic precautions - no obvious source of sepsis.  Abx stopped 9/26, monitor clinically  - follow BC and UC    Dysuria  - UA neg - bladder scan showed > 1L in bladder.  Will place foley and start flomax per protocol.  Reassess in 3 days/ foley removal trial    Psychomotor agitation Depression Cancer pain - off precedex 9/26 - continue home Remeron, restart home MS contin, may need prn oxy IR for breakthrough pain - prn haldol for agitation  - consider MRI brain evaluate for brain mets ( was neg 12/09/20) - hx of ETOH use, unclear if still drinks at home.  Will clarify with wife, but will start empiric thiamine/ folate    Diarrhea - prn imodium    Weight loss, high risk for protein calorie malnutrition  - maximize nutrition - continue home megace    Best practice:  Diet: regular  Pain/Anxiety/Delirium protocol (if indicated): n/a  VAP protocol (if indicated): N/A DVT prophylaxis: Xarelto GI prophylaxis: Protonix Glucose control: N/A Mobility/Activity: advance as able    Code Status: Partial Code; BiPAP only, otherwise DNI/ DNR, no aggressive measures  Family Communication: patient updated on plan of care.  No family at bedside Disposition:  stable for transfer to telemetry today and to [facility] as of 9/27.    Labs   CBC: Recent Labs  Lab 02/28/21 1356 03/01/21 0119 03/01/21 0538 03/02/21 0143  WBC 2.0* 1.3* 1.4* 1.3*  NEUTROABS 1.1*  --   --   --   HGB 9.8* 7.4* 7.8* 9.3*  HCT 28.0* 21.3* 22.1* 26.8*  MCV 89.2 90.6 89.5 90.8  PLT 162 147* 130* 601    Basic Metabolic Panel: Recent Labs  Lab 02/28/21 1356  03/01/21 0119 03/01/21 0538 03/02/21 0143  NA 137  --  134* 134*  K 3.0*  --  3.4* 4.1  CL 100  --  101 105  CO2 21*  --  22 17*  GLUCOSE 115*  --  115* 84  BUN 8  --  7* 7*  CREATININE 0.55* 0.59* 0.61 0.63  CALCIUM 9.4  --  8.6* 8.4*  MG  --  1.3* 2.3  --   PHOS  --   --  3.4  --    GFR: Estimated Creatinine Clearance: 72.8 mL/min (by C-G formula based on SCr of 0.63 mg/dL). Recent Labs  Lab 02/28/21 1356 02/28/21 1440 03/01/21 0119 03/01/21 0538 03/02/21 0143  PROCALCITON  --   --  <0.10  --   --   WBC 2.0*  --  1.3* 1.4* 1.3*  LATICACIDVEN  --  3.8* 1.1  --   --     Liver Function Tests: Recent Labs  Lab 02/28/21 1356  AST 21  ALT 10  ALKPHOS 107  BILITOT 1.2  PROT 6.8  ALBUMIN 3.4*   No results for input(s): LIPASE, AMYLASE in the last 168 hours. No results for input(s): AMMONIA in the last 168 hours.  ABG    Component Value Date/Time   PHART 7.637 (HH) 02/28/2021 1400   PCO2ART 16.2 (LL) 02/28/2021 1400   PO2ART 257 (H) 02/28/2021 1400   HCO3 25.5 03/01/2021 0119   ACIDBASEDEF 2.1 (H) 02/28/2021 1400   O2SAT 28.0 03/01/2021 0119     Coagulation Profile: Recent Labs  Lab 02/28/21 1356  INR 1.2    Cardiac Enzymes: No results for input(s): CKTOTAL, CKMB, CKMBINDEX, TROPONINI in the last 168 hours.  HbA1C: Hgb A1c MFr Bld  Date/Time Value Ref Range Status  02/15/2017 04:39 PM 4.9 4.8 - 5.6 % Final    Comment:             Prediabetes: 5.7 - 6.4          Diabetes: >6.4          Glycemic control for adults with diabetes: <7.0     CBG: No results for input(s): GLUCAP in the last 168 hours.      Kennieth Rad, ACNP [facility] 03/02/2021, 11:14 AM  See Amion for pager If no response to pager, please call PCCM consult pager After 7:00 pm call Elink

## 2021-03-02 NOTE — Progress Notes (Signed)
HEMATOLOGY-ONCOLOGY PROGRESS NOTE  SUBJECTIVE: Mr. Christopher Harding is followed by our office for metastatic non-small cell lung cancer, adenocarcinoma.  He was initially diagnosed in May 2021.  He has been receiving systemic treatment with carboplatin for an AUC of 5, Alimta 500 mg per metered squared, and Keytruda 200 mg IV every 3 weeks.  Status post 2 cycles.  Last cycle was given on 02/12/2021.  Now admitted with acute hypoxic respiratory failure.  CTA chest showed no PE, similar consolidation and bandlike density in the right upper lobe and superior segment of the right lower lobe, small right-sided pleural effusion with loculation superiorly-stable to minimally increased in size, other punctate bilateral pulmonary nodules unchanged, incompletely visualized large metastatic lesion in the left hepatic lobe.  CT of the head showed no evidence for acute intracranial abnormality.  The patient is starting to feel better.  He is not having any fevers.  He still has generalized fatigue.  He is not having any shortness of breath.  He is not requiring any oxygen at this time.  Pain is adequately controlled with current pain medications.  He otherwise offers no other specific complaints.  Oncology History  Adenocarcinoma, lung, right ([place])  11/13/2019 Initial Diagnosis   Adenocarcinoma, lung, right ([place])   07/15/2020 Cancer Staging   Staging form: Lung, AJCC 8th Edition - Clinical: Stage IVB (cT3, cN2, pM1c) - Signed by Curt Bears, MD on 01/28/2021   Adenocarcinoma of right lung, stage 3 ([place])  11/13/2019 Initial Diagnosis   Adenocarcinoma of right lung, stage 3 ([place])   11/26/2019 - 01/07/2020 Chemotherapy   The patient had palonosetron (ALOXI) injection 0.25 mg, 0.25 mg, Intravenous,  Once, 7 of 7 cycles Administration: 0.25 mg (11/26/2019), 0.25 mg (12/24/2019), 0.25 mg (12/31/2019), 0.25 mg (12/03/2019), 0.25 mg (01/07/2020), 0.25 mg (12/11/2019), 0.25 mg (12/17/2019) CARBOplatin (PARAPLATIN) 200 mg in sodium chloride  0.9 % 100 mL chemo infusion, 200 mg (100 % of original dose 200.4 mg), Intravenous,  Once, 7 of 7 cycles Dose modification: 200.4 mg (original dose 200.4 mg, Cycle 1) Administration: 200 mg (11/26/2019), 200 mg (12/24/2019), 200 mg (12/31/2019), 200 mg (12/03/2019), 200 mg (01/07/2020), 200 mg (12/11/2019), 200 mg (12/17/2019) PACLitaxel (TAXOL) 90 mg in sodium chloride 0.9 % 250 mL chemo infusion (</= $RemoveBefor'80mg'OHsiBJKBBLpI$ /m2), 45 mg/m2 = 90 mg, Intravenous,  Once, 7 of 7 cycles Administration: 90 mg (11/26/2019), 90 mg (12/24/2019), 90 mg (12/31/2019), 90 mg (12/03/2019), 90 mg (01/07/2020), 90 mg (12/11/2019), 90 mg (12/17/2019)   for chemotherapy treatment.     02/19/2020 - 07/15/2020 Chemotherapy          06/17/2020 Cancer Staging   Staging form: Lung, AJCC 8th Edition - Clinical: Stage IIIB (cT3, cN2, cM0) - Signed by Curt Bears, MD on 06/17/2020   01/22/2021 -  Chemotherapy    Patient is on Treatment Plan: LUNG CARBOPLATIN / PEMETREXED / PEMBROLIZUMAB Q21D INDUCTION X 4 CYCLES / MAINTENANCE PEMETREXED + PEMBROLIZUMAB          REVIEW OF SYSTEMS:   Constitutional: Denies fevers, chills  Eyes: Denies blurriness of vision Ears, nose, mouth, throat, and face: Denies mucositis or sore throat Respiratory: Denies cough, dyspnea or wheezes Cardiovascular: Denies palpitation, chest discomfort Gastrointestinal:  Denies nausea, heartburn  Skin: Denies abnormal skin rashes Lymphatics: Denies new lymphadenopathy or easy bruising Neurological:Denies numbness, tingling or new weaknesses Behavioral/Psych: Mood is stable, no new changes  Extremities: No lower extremity edema All other systems were reviewed with the patient and are negative.  I have reviewed the past  medical history, past surgical history, social history and family history with the patient and they are unchanged from previous note.   PHYSICAL EXAMINATION: ECOG PERFORMANCE STATUS: 2 - Symptomatic, <50% confined to bed  Vitals:   03/02/21 1100  03/02/21 1200  BP: (!) 164/78 (!) 141/78  Pulse:  66  Resp: (!) 21 16  Temp:    SpO2:  100%   Filed Weights   02/28/21 1357 03/02/21 0500  Weight: 58.5 kg 59.9 kg    Intake/Output from previous day: 09/25 0701 - 09/26 0700 In: 3092.5 [I.V.:1834; IV Piggyback:1258.4] Out: 800 [Urine:800]  GENERAL: Chronically ill-appearing male, no distress SKIN: skin color, texture, turgor are normal, no rashes or significant lesions EYES: normal, Conjunctiva are pink and non-injected, sclera clear LUNGS: normal breathing effort HEART: Irregular, A. fib on monitor ABDOMEN:abdomen soft, non-tender and normal bowel sounds  NEURO: alert & oriented x 3 with fluent speech, no focal motor/sensory deficits  LABORATORY DATA:  I have reviewed the data as listed CMP Latest Ref Rng & Units 03/02/2021 03/01/2021 03/01/2021  Glucose 70 - 99 mg/dL 84 115(H) -  BUN 8 - 23 mg/dL 7(L) 7(L) -  Creatinine 0.61 - 1.24 mg/dL 0.63 0.61 0.59(L)  Sodium 135 - 145 mmol/L 134(L) 134(L) -  Potassium 3.5 - 5.1 mmol/L 4.1 3.4(L) -  Chloride 98 - 111 mmol/L 105 101 -  CO2 22 - 32 mmol/L 17(L) 22 -  Calcium 8.9 - 10.3 mg/dL 8.4(L) 8.6(L) -  Total Protein 6.5 - 8.1 g/dL - - -  Total Bilirubin 0.3 - 1.2 mg/dL - - -  Alkaline Phos 38 - 126 U/L - - -  AST 15 - 41 U/L - - -  ALT 0 - 44 U/L - - -    Lab Results  Component Value Date   WBC 1.3 (LL) 03/02/2021   HGB 9.3 (L) 03/02/2021   HCT 26.8 (L) 03/02/2021   MCV 90.8 03/02/2021   PLT 156 03/02/2021   NEUTROABS 1.1 (L) 02/28/2021    CT HEAD W & WO CONTRAST (5MM)  Result Date: 02/28/2021 CLINICAL DATA:  History of lung cancer, altered EXAM: CT HEAD WITHOUT AND WITH CONTRAST TECHNIQUE: Contiguous axial images were obtained from the base of the skull through the vertex without and with intravenous contrast CONTRAST:  96mL OMNIPAQUE IOHEXOL 350 MG/ML SOLN COMPARISON:  MRI 12/09/2020 FINDINGS: Brain: No acute territorial infarction, hemorrhage, or intracranial mass is  visualized. Mild atrophy and chronic small vessel ischemic changes of the white matter. Stable ventricle size. No abnormal focal contrast enhancement within the brain parenchyma. Vascular: No hyperdense vessels. 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 Skull: Normal. Negative for fracture or focal lesion. Sinuses/Orbits: No acute finding. Other: None IMPRESSION: 1. No CT evidence for acute intracranial abnormality. Atrophy and chronic small-vessel ischemic changes of the white matter. Negative for enhancing mass lesion. 2. 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 Electronically Signed   By: Donavan Foil M.D.   On: 02/28/2021 21:59   CT Angio Chest PE W/Cm &/Or Wo Cm  Result Date: 02/28/2021 CLINICAL DATA:  History of lung cancer hypoxia altered EXAM: CT ANGIOGRAPHY CHEST WITH CONTRAST TECHNIQUE: Multidetector CT imaging of the chest was performed using the standard protocol during bolus administration of intravenous contrast. Multiplanar CT image reconstructions and MIPs were obtained to evaluate the vascular anatomy. CONTRAST:  71mL OMNIPAQUE IOHEXOL 350 MG/ML SOLN COMPARISON:  Chest x-ray 02/28/2021, CT chest 01/12/2021, 11/06/2020, 08/08/2020, PET CT  11/12/2018 FINDINGS: Cardiovascular: Satisfactory opacification of the pulmonary arteries to the segmental level. No evidence of pulmonary embolism. Moderate aortic atherosclerosis. No aneurysm. Coronary vascular calcification. Normal cardiac size. No pericardial effusion Mediastinum/Nodes: Midline trachea. No thyroid. No increasing adenopathy. Esophagus within normal limits. Lungs/Pleura: Small right-sided pleural effusion with loculation at the apex is stable to minimally increased in size. Bandlike consolidation in the right upper lobe and superior segment of right lower lobe grossly similar in appearance. 4 mm left upper lobe lung nodule, series 4, image 58, previously 4 mm. Stable densely calcified nodule in the right lung base. Stable 3 mm  left posterior subpleural lung nodule series 4, image 18. Stable 3 mm right subpleural lower lobe lung nodule, series 4, image 115. Upper Abdomen: Incompletely visualized large hepatic metastatic lesion in the left hepatic lobe. Musculoskeletal: No acute or suspicious osseous abnormality Review of the MIP images confirms the above findings. IMPRESSION: 1. No acute pulmonary embolus is seen. 2. Similar consolidation and bandlike density in the right upper lobe and superior segment of right lower lobe. Small right-sided pleural effusion with loculation superiorly, this is stable to minimally increased in size. Other punctate bilateral pulmonary nodules are unchanged. 3. Incompletely visualized large metastatic lesion in the left hepatic lobe Aortic Atherosclerosis (ICD10-I70.0). Electronically Signed   By: Donavan Foil M.D.   On: 02/28/2021 21:51   DG Chest Port 1 View  Result Date: 02/28/2021 CLINICAL DATA:  Questionable sepsis - evaluate for abnormality history lung cancer EXAM: PORTABLE CHEST 1 VIEW COMPARISON:  Oct 30, 2019, November 06, 2020 FINDINGS: The cardiomediastinal silhouette is unchanged in contour.Revisualization an irregular contour of the RIGHT upper lung along the mediastinal border, similar in comparison to prior. Asymmetric RIGHT apical pleural thickening, unchanged nodular opacity overlying the RIGHT costophrenic angle, unchanged and consistent with a calcified granuloma. No pleural effusion. No pneumothorax. No acute pleuroparenchymal abnormality. Visualized abdomen is unremarkable. IMPRESSION: No new focal consolidation. Electronically Signed   By: Valentino Saxon M.D.   On: 02/28/2021 14:09    ASSESSMENT AND PLAN: This is a 70 year old male with metastatic non-small cell lung cancer, adenocarcinoma.  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  He initially received treatment with concurrent chemoradiation with weekly carboplatin for an AUC of 2 and paclitaxel 45 mg per metered squared  status post 7 cycles and had a partial response to treatment.  He then received consolidation immunotherapy with Imfinzi 1500 mg IV every 4 weeks and is status post 6 cycles.  Treatment was discontinued secondary to disease progression.  He then developed a suspicious liver lesion concerning for metastatic disease which was biopsied and read at [facility] in addition to [facility] which showed no evidence of malignancy and there was suspicious inflammatory process of biliary origin.  The patient was then on observation but the liver lesion continued to grow in size.  He underwent a second liver biopsy and pathology was consistent with metastatic non-small cell lung cancer, poorly differentiated carcinoma favoring squamous cell.  Molecular studies from the liver biopsy showed no actionable mutations and it was unclear the discrepancy between the previous lung biopsy molecular study as well as liver biopsy molecular studies except the new liver lesion in the liver is consistent with poorly differentiated squamous cell carcinoma compared to the lung biopsy that was adenocarcinoma at that time.  He was then started on second line treatment with Lumakras 960 mg daily and was on this for 1 month but repeat imaging showed evidence of disease  progression in the liver so this was discontinued.  He is now receiving palliative systemic chemotherapy with carboplatin for an AUC of 5, Alimta 500 mg per metered squared, and Keytruda 200 mg IV every 3 weeks.  Status post 2 cycles.  He has been experiencing fatigue, lack of appetite, and weight loss on chemotherapy.  He has now been admitted for acute hypoxic respiratory failure and A. fib with RVR.  The patient's respiratory status is improving.  He is no longer requiring oxygen.  Discussed with PCCM and antibiotics have been stopped at this time.  Plan is to transfer out of the ICU to a telemetry bed today and for [facility] hospitalist to assume care effective 9/27.  The patient and his wife  met with the palliative care team over the weekend to discuss goals of care.  Patient and wife agreeable to limited code-okay with escalation to BiPAP but no CPR, mechanical ventilation, or overly aggressive interventions.  There was some preliminary discussion regarding palliative care and possibly hospice.  However, given improvement in the patient's condition at this time, the patient's wife has told me that she is not interested in pursuing hospice or palliative care and would like to continue on systemic treatment with chemotherapy/immunotherapy.  The patient was scheduled for outpatient follow-up in our office later this week and I have sent a message to our scheduler to delay appointments by about 1 week to allow him additional time to recover.  Labs from today have been reviewed.  His hemoglobin is at 9.3 today and platelets are normal at 156,000.  No transfusion is indicated.  Total white count is 1.3 and ANC is 0.4.  Will initiate Granix 300 mcg subcu daily till [place] is 1.5 or higher.  Suspect neutropenia is related to recent chemotherapy and possibly some of the antibiotics that he received here in the hospital.  I have requested a PT/OT evaluation.  Future Appointments  Date Time Provider [place]  03/04/2021 10:15 AM [facility] LAB [facility] None  03/04/2021 10:45 AM Curt Bears, MD [facility] None  03/04/2021 11:30 AM [facility] INFUSION [facility] None  03/04/2021 12:00 PM Morrell Riddle, RD [facility] None  03/12/2021 11:30 AM [facility] LAB [facility] None  03/19/2021 11:30 AM [facility] LAB [facility] None  03/25/2021 11:00 AM [facility] LAB [facility] None  03/25/2021 11:30 AM Heilingoetter, Cassandra L, PA-C [facility] None  03/25/2021 12:30 PM [facility] INFUSION [facility] None  04/02/2021 11:30 AM [facility] LAB [facility] None  04/09/2021 11:30 AM [facility] LAB [facility] None  04/15/2021  9:30 AM [facility] LAB [facility] None   04/15/2021 10:00 AM Curt Bears, MD [facility] None  04/15/2021 11:00 AM [facility] INFUSION [facility] None      LOS: 2 days   Mikey Bussing, DNP, AGPCNP-BC, AOCNP 03/02/21

## 2021-03-02 NOTE — Evaluation (Signed)
Clinical/Bedside Swallow Evaluation Patient Details  Name: Christopher Harding MRN: [id number] Date of Birth: December 09, 1950  Today's Date: 03/02/2021 Time: SLP Start Time (ACUTE ONLY): 28 SLP Stop Time (ACUTE ONLY): 0930 SLP Time Calculation (min) (ACUTE ONLY): 15 min  Past Medical History:  Past Medical History:  Diagnosis Date   A-fib [facility])    s/p DCCV 12/07/18   Cancer ([place])    Depression    situational   Dysrhythmia    Hypertension    Persistent atrial fibrillation ([place]) 11/23/2018   Persistent atrial fibrillation   Tobacco abuse 11/23/2018   Past Surgical History:  Past Surgical History:  Procedure Laterality Date   CARDIOVERSION N/A 12/07/2018   Procedure: CARDIOVERSION;  Surgeon: Josue Hector, MD;  Location: [place];  Service: Cardiovascular;  Laterality: N/A;   FINE NEEDLE ASPIRATION  10/30/2019   Procedure: FINE NEEDLE ASPIRATION (FNA) LINEAR;  Surgeon: Candee Furbish, MD;  Location: [facility] ENDOSCOPY;  Service: Pulmonary;;   INGUINAL HERNIA REPAIR Right 01/25/2019   Procedure: RIGHT INGUINAL HERNIA REPAIR WITH MESH;  Surgeon: Coralie Keens, MD;  Location: [place];  Service: General;  Laterality: Right;  TAP BLOCK   INSERTION OF MESH Right 01/25/2019   Procedure: Insertion Of Mesh;  Surgeon: Coralie Keens, MD;  Location: [place];  Service: General;  Laterality: Right;   VIDEO BRONCHOSCOPY WITH ENDOBRONCHIAL ULTRASOUND N/A 10/30/2019   Procedure: VIDEO BRONCHOSCOPY WITH ENDOBRONCHIAL ULTRASOUND;  Surgeon: Candee Furbish, MD;  Location[facility] ENDOSCOPY;  Service: Pulmonary;  Laterality: N/A;   HPI:  for pulmonary embolism and redemonstrated previously identified consolidation and bandlike density in the right upper lobe and superior segment of right lower lobe, small right-sided pleural effusion, and other punctate bilateral pulmonary nodules, along with a large metastatic lesion in the left hepatic lobe.  CT head was negative for an acute intracranial process.    Assessment /  Plan / Recommendation  Clinical Impression  Patient did not exhibit any overt s/s dysphagia, with no overt s/s aspiration or penetration observed. He consumed small amounts of liquids (water) and  half of a saltine cracker but pleasantly declined any other PO's. (Per RN he has primarily just been wanting to sleep). When questioned, patient did state that ever since he started chemotherapy, he has had some decreased appetite, dry mouth/throat and productive coughing. SLP is recommending that patient start on a regular solids, thin liquids diet and for ST services to try to see patient one more time to ensure diet toleration as well as need for any patient/family education regarding swallowing, oral care/dry mouth strategies. SLP Visit Diagnosis: Dysphagia, unspecified (R13.10)    Aspiration Risk  No limitations;Mild aspiration risk    Diet Recommendation Regular;Thin liquid   Liquid Administration via: Cup;Straw Medication Administration: Whole meds with liquid Supervision: Patient able to self feed Compensations: Slow rate;Small sips/bites;Minimize environmental distractions Postural Changes: Seated upright at 90 degrees    Other  Recommendations Oral Care Recommendations: Oral care BID    Recommendations for follow up therapy are one component of a multi-disciplinary discharge planning process, led by the attending physician.  Recommendations may be updated based on patient status, additional functional criteria and insurance authorization.  Follow up Recommendations None      Frequency and Duration min 1 x/week  1 week       Prognosis Prognosis for Safe Diet Advancement: Good      Swallow Study   General Date of Onset: 03/02/21 HPI: for pulmonary embolism and redemonstrated previously identified consolidation  and bandlike density in the right upper lobe and superior segment of right lower lobe, small right-sided pleural effusion, and other punctate bilateral pulmonary nodules,  along with a large metastatic lesion in the left hepatic lobe.  CT head was negative for an acute intracranial process. Type of Study: Bedside Swallow Evaluation Previous Swallow Assessment: None found Diet Prior to this Study: NPO Temperature Spikes Noted: No Respiratory Status: Room air History of Recent Intubation: No Behavior/Cognition: Alert;Cooperative;Pleasant mood;Confused Oral Cavity Assessment: Within Functional Limits Oral Care Completed by SLP: No Oral Cavity - Dentition: Adequate natural dentition Vision: Functional for self-feeding Self-Feeding Abilities: Able to feed self Patient Positioning: Upright in bed Baseline Vocal Quality: Normal Volitional Cough: Strong Volitional Swallow: Able to elicit    Oral/Motor/Sensory Function Overall Oral Motor/Sensory Function: Within functional limits   Ice Chips     Thin Liquid Thin Liquid: Within functional limits Presentation: Straw;Self Fed    Nectar Thick     Honey Thick     Puree     Solid     Solid: Within functional limits Presentation: [place], MA, CCC-SLP Speech Therapy

## 2021-03-02 NOTE — Progress Notes (Signed)
Daily Progress Note   Patient Name: Christopher Harding       Date: 03/02/2021 DOB: 03-May-1951  Age: 70 y.o. MRN#: [id number] Attending Physician: Lanier Clam, MD Primary Care Physician: Lujean Amel, MD Admit Date: 02/28/2021  Reason for Consultation/Follow-up: Establishing goals of care  Subjective: I saw and examined Christopher Harding today.  At time of my encounter he was lying in bed in no distress.  He was awake, alert, and interactive.  He tells me he is feeling much better and wants to go home.  His wife was at the bedside as well.  We reviewed clinical course overnight and recommendation from oncology team to delay treatment for a week to give him time to recover but plan is for follow-up for continuation of systemic treatment with chemotherapy/immunotherapy due to the fact that he is much improved today.  Length of Stay: 2  Current Medications: Scheduled Meds:   (feeding supplement) PROSource Plus  30 mL Oral BID BM   budesonide (PULMICORT) nebulizer solution  0.5 mg Nebulization BID   Chlorhexidine Gluconate Cloth  6 each Topical Daily   folic acid  1 mg Oral Daily   mouth rinse  15 mL Mouth Rinse BID   megestrol  300 mg Oral BID   metoprolol tartrate  50 mg Oral BID   morphine  30 mg Oral Q12H   multivitamin with minerals  1 tablet Oral Daily   rivaroxaban  20 mg Oral Q supper   tamsulosin  0.4 mg Oral Daily   Tbo-filgastrim (GRANIX) SQ  300 mcg Subcutaneous q1800   thiamine  100 mg Oral Daily    Continuous Infusions:   PRN Meds: haloperidol lactate, influenza vaccine adjuvanted, ondansetron (ZOFRAN) IV, polyethylene glycol  Physical Exam    General: Alert, awake, in no acute distress.   HEENT: No bruits, no goiter, no JVD Heart: Regular rate and rhythm. No  murmur appreciated. Lungs: Good air movement, clear Abdomen: Soft, nontender, nondistended, positive bowel sounds.   Ext: No significant edema Skin: Warm and dry Neuro: Grossly intact, nonfocal.        Vital Signs: BP 138/77 (BP Location: Left Arm)   Pulse (!) 41   Temp 98.7 F (37.1 C) (Oral)   Resp (!) 21   Ht 6\' 2"  (1.88 m)   Wt 59.9  kg   SpO2 98%   BMI 16.95 kg/m  SpO2: SpO2: 98 % O2 Device: O2 Device: Room Air O2 Flow Rate:    Intake/output summary:  Intake/Output Summary (Last 24 hours) at 03/02/2021 1846 Last data filed at 03/02/2021 1300 Gross per 24 hour  Intake 2119.82 ml  Output 1285 ml  Net 834.82 ml   LBM: Last BM Date: 03/02/21 Baseline Weight: Weight: 58.5 kg Most recent weight: Weight: 59.9 kg       Palliative Assessment/Data:    Flowsheet Rows    Flowsheet Row Most Recent Value  Intake Tab   Referral Department Critical care  Unit at Time of Referral ICU  Palliative Care Primary Diagnosis Cancer  Date Notified 03/01/21  Palliative Care Type New Palliative care  Reason for referral Clarify Goals of Care  Date of Admission 02/28/21  Date first seen by Palliative Care 03/01/21  # of days Palliative referral response time 0 Day(s)  # of days IP prior to Palliative referral 1  Clinical Assessment   Palliative Performance Scale Score 60%  Psychosocial & Spiritual Assessment   Palliative Care Outcomes   Patient/Family meeting held? Yes  Who was at the meeting? 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   Cholangitis 09/12/2020   Encounter for antineoplastic immunotherapy 02/07/2020   Adenocarcinoma, lung, right ([place]) 11/13/2019   Goals of care, counseling/discussion 11/13/2019   Adenocarcinoma of right lung, stage 3 ([place]) 11/13/2019   Encounter for antineoplastic chemotherapy 11/13/2019   Essential hypertension 11/23/2018   Alcohol abuse 11/23/2018    Palliative Care Assessment & Plan   Patient Profile: 70 year old male with metastatic non-small cell lung cancer currently on systemic palliative chemotherapy with carboplatin, 5 AUC, Alimta and Keytruda every 3 weeks status post 2 cycles.  Admitted for worsening mental status but this is improved today.  Recommendations/Plan: MOST form completed yesterday Continue current care.  He is clinically improved today and plan is to follow-up with oncology next week for likely continuation of palliative chemotherapy. No other palliative specific recommendations at this time.  Therefore, we will not be rounding on Christopher Harding on a daily basis going forward.  Please call if there are specific needs with which we can be of assistance.   Code Status:    Code Status Orders  (From admission, onward)           Start     Ordered   03/01/21 1724  Limited resuscitation (code)  Continuous       Question Answer Comment  In the event of cardiac or respiratory ARREST: Initiate Code Blue, Call Rapid Response Yes   In the event of cardiac or respiratory ARREST: Perform CPR No   In the event of cardiac or respiratory ARREST: Perform Intubation/Mechanical Ventilation No   In the event of cardiac or respiratory ARREST: Use NIPPV/BiPAp only if indicated Yes   In the event of cardiac or respiratory ARREST: Administer ACLS medications if indicated No   In the event of cardiac or respiratory ARREST: Perform Defibrillation or  Cardioversion if indicated No   Comments ok to do bipap for comfort or high work of  breathing or desaturations despite 02 rx      03/01/21 1738           Code Status History     Date Active Date Inactive Code Status Order ID Comments User Context   03/01/2021 1641 03/01/2021 1738 DNR [id number]  Tanda Rockers, MD Inpatient   02/28/2021 2338 03/01/2021 1641 Full Code [id number]  Renee Pain, MD ED      Advance Directive Documentation    Flowsheet Row Most Recent Value  Type of Advance Directive Healthcare Power of Attorney, Living will  Pre-existing out of facility DNR order (yellow form or pink MOST form) --  "MOST" Form in Place? --       Prognosis:  Unable to determine  Discharge Planning: Home with [place] was discussed with patient, wife  Thank you for allowing the Palliative Medicine Team to assist in the care of this patient.   Total Time 25 Prolonged Time Billed No      Greater than 50%  of this time was spent counseling and coordinating care related to the above assessment and plan.  Micheline Rough, MD  Please contact Palliative Medicine Team phone at [phone] for questions and concerns.

## 2021-03-02 NOTE — Progress Notes (Signed)
Per pt and wife, Megace costs too much and is not covered by insurance.  Pt and wife declining Megace administration at this time, stating he will just drink Ensure PRN.  NP Jennelle Human notified.

## 2021-03-02 NOTE — Progress Notes (Addendum)
[facility] Progress Note Patient Name: Christopher Harding DOB: May 26, 1951 MRN: [id number]   Date of Service  03/02/2021  HPI/Events of Note  Notified of change in rhythm appears to be afib RVR 130s on EKG with corresponding decrease in blood pressure to 75/53 from 106/55. Precedex turned off.  Latest K 3.4 given 4 runs of K Patient with known afib on rivaroxaban  Seen lethargic but responsive  eICU Interventions  Giving 500 cc LR bolus Cardioversion indicated however advanced directives declines cardioversion Will give amiodarone 150 mg bolus Morning labs to be drawn earlier  Follow-up: Converted to sinus 60s after amiodarone bolus. BP 84/49 K 4.1, H/H 9.3/26.8  Ordered another 500 cc LR bolus     Intervention Category Intermediate Interventions: Arrhythmia - evaluation and management  Shona Needles Mickenzie Stolar 03/02/2021, 1:22 AM

## 2021-03-03 ENCOUNTER — Telehealth: Payer: Self-pay | Admitting: Medical Oncology

## 2021-03-03 DIAGNOSIS — D701 Agranulocytosis secondary to cancer chemotherapy: Secondary | ICD-10-CM | POA: Diagnosis not present

## 2021-03-03 DIAGNOSIS — T451X5A Adverse effect of antineoplastic and immunosuppressive drugs, initial encounter: Secondary | ICD-10-CM | POA: Diagnosis not present

## 2021-03-03 LAB — DIFFERENTIAL
Abs Immature Granulocytes: 0.1 10*3/uL — ABNORMAL HIGH (ref 0.00–0.07)
Basophils Absolute: 0 10*3/uL (ref 0.0–0.1)
Basophils Relative: 0 %
Eosinophils Absolute: 0 10*3/uL (ref 0.0–0.5)
Eosinophils Relative: 0 %
Lymphocytes Relative: 36 %
Lymphs Abs: 1 10*3/uL (ref 0.7–4.0)
Monocytes Absolute: 0.6 10*3/uL (ref 0.1–1.0)
Monocytes Relative: 21 %
Myelocytes: 2 %
Neutro Abs: 1.1 10*3/uL — ABNORMAL LOW (ref 1.7–7.7)
Neutrophils Relative %: 41 %

## 2021-03-03 LAB — CBC
HCT: 26.8 % — ABNORMAL LOW (ref 39.0–52.0)
HCT: 23 % — ABNORMAL LOW (ref 39.0–52.0)
Hemoglobin: 9.3 g/dL — ABNORMAL LOW (ref 13.0–17.0)
Hemoglobin: 8 g/dL — ABNORMAL LOW (ref 13.0–17.0)
MCH: 31.5 pg (ref 26.0–34.0)
MCH: 31.4 pg (ref 26.0–34.0)
MCHC: 34.7 g/dL (ref 30.0–36.0)
MCHC: 34.8 g/dL (ref 30.0–36.0)
MCV: 90.8 fL (ref 80.0–100.0)
MCV: 90.2 fL (ref 80.0–100.0)
Platelets: 156 10*3/uL (ref 150–400)
Platelets: 223 10*3/uL (ref 150–400)
RBC: 2.95 MIL/uL — ABNORMAL LOW (ref 4.22–5.81)
RBC: 2.55 MIL/uL — ABNORMAL LOW (ref 4.22–5.81)
RDW: 14.7 % (ref 11.5–15.5)
RDW: 15 % (ref 11.5–15.5)
WBC: 1.3 10*3/uL — CL (ref 4.0–10.5)
WBC: 2.8 10*3/uL — ABNORMAL LOW (ref 4.0–10.5)
nRBC: 0 % (ref 0.0–0.2)
nRBC: 0 % (ref 0.0–0.2)

## 2021-03-03 LAB — BASIC METABOLIC PANEL
Anion gap: 7 (ref 5–15)
BUN: 8 mg/dL (ref 8–23)
CO2: 21 mmol/L — ABNORMAL LOW (ref 22–32)
Calcium: 8.3 mg/dL — ABNORMAL LOW (ref 8.9–10.3)
Chloride: 104 mmol/L (ref 98–111)
Creatinine, Ser: 0.61 mg/dL (ref 0.61–1.24)
GFR, Estimated: >60 mL/min (ref >60)
Glucose, Bld: 75 mg/dL (ref 70–99)
Potassium: 3.4 mmol/L — ABNORMAL LOW (ref 3.5–5.1)
Sodium: 132 mmol/L — ABNORMAL LOW (ref 135–145)

## 2021-03-03 MED ORDER — TAMSULOSIN HCL 0.4 MG PO CAPS: 0.4000 mg | ORAL_CAPSULE | Freq: Every day | ORAL | 0 refills | Status: AC

## 2021-03-03 MED ORDER — TBO-FILGRASTIM 300 MCG/0.5ML ~~LOC~~ SOSY
300.0000 ug | PREFILLED_SYRINGE | Freq: Every day | SUBCUTANEOUS | Status: AC
  Administered 2021-03-03: 300 ug via SUBCUTANEOUS
  Filled 2021-03-03: qty 0.5

## 2021-03-03 MED ORDER — TAMSULOSIN HCL 0.4 MG PO CAPS: 0.4000 mg | ORAL_CAPSULE | Freq: Every day | ORAL | 0 refills | Status: DC

## 2021-03-03 MED ORDER — POTASSIUM CHLORIDE CRYS ER 20 MEQ PO TBCR
40.0000 meq | EXTENDED_RELEASE_TABLET | Freq: Once | ORAL | Status: AC
  Administered 2021-03-03: 40 meq via ORAL
  Filled 2021-03-03: qty 2

## 2021-03-03 NOTE — Progress Notes (Signed)
Patient discharged to home, per NP Simpson patient is to discharge with foley catheter. Standard drainage bag switched to a leg bag, Mr. And Mrs.Huebert educated on foley catheter care. They are aware of follow up appointment with urology. Printed prescription provided. IV removed. AVS reviewed and all questions were answered. Telemetry notified and tele-box returned. Patient was assisted to the main entrance and his wife provided transportation.

## 2021-03-03 NOTE — Plan of Care (Signed)
  Problem: Education: Goal: Knowledge of General Education information will improve Description Including pain rating scale, medication(s)/side effects and non-pharmacologic comfort measures Outcome: Progressing   Problem: Activity: Goal: Risk for activity intolerance will decrease Outcome: Progressing   Problem: Safety: Goal: Ability to remain free from injury will improve Outcome: Progressing   

## 2021-03-03 NOTE — Evaluation (Signed)
Physical Therapy One Time Evaluation Patient Details Name: Christopher Harding MRN: [id number] DOB: [birth year] Today's Date: 03/03/2021  History of Present Illness  70 year old man with lung cancer admitted with hypoxemia.  Most recent chemotherapy 02/12/2021.  Unclear cause.  CT chest without new acute finding, chronic changes present on previous scans.  No clear reason for hypoxemia.  Seems to have resolved, on room air.  PMHx: Tobacco abuse, ETOH use, Afib, depression, HTN, metastatic (liver) adenocarcinoma lung cancer (currently on systemic palliative carboplatin, alimta, and keytruda)  Clinical Impression  Patient evaluated by Physical Therapy with no further acute PT needs identified. All education has been completed and the patient has no further questions.  Pt reports he wasn't able to tolerate much activity for about a month prior to admission.  Pt mostly ambulating room to room or outside to porch at home.  Pt would benefit from either HHPT or OPPT if agreeable. Pt encouraged to mobilize with staff during hospitalization.  See below for any follow-up Physical Therapy or equipment needs. PT is signing off. Thank you for this referral.      Recommendations for follow up therapy are one component of a multi-disciplinary discharge planning process, led by the attending physician.  Recommendations may be updated based on patient status, additional functional criteria and insurance authorization.  Follow Up Recommendations Home health PT;Supervision - Intermittent or OPPT if able to transport    Equipment Recommendations  None recommended by PT    Recommendations for Other Services       Precautions / Restrictions Precautions Precautions: Fall Restrictions Weight Bearing Restrictions: No      Mobility  Bed Mobility Overal bed mobility: Modified Independent                  Transfers Overall transfer level: Needs assistance Equipment used: None Transfers: Sit to/from  Stand Sit to Stand: Supervision            Ambulation/Gait Ambulation/Gait assistance: Min guard;Supervision Gait Distance (Feet): 120 Feet Assistive device: None Gait Pattern/deviations: Step-through pattern;Decreased stride length     General Gait Details: mildly unsteady initially however pt reports not being OOB since admission, improved with time, pt preferred to remain in room so ambulated in room; pt reports only generalized weakness and fatigue, attempted to monitor SPO2 however pt with cold hands and finger pulse ox not reading (pt did not appear in respiratory distress either)  Stairs            Wheelchair Mobility    Modified Rankin (Stroke Patients Only)       Balance Overall balance assessment: Needs assistance         Standing balance support: No upper extremity supported Standing balance-Leahy Scale: Good                               Pertinent Vitals/Pain Pain Assessment: No/denies pain    Home Living Family/patient expects to be discharged to:: Private residence Living Arrangements: Spouse/significant other   Type of Home: House       Home Layout: Able to live on main level with bedroom/bathroom;Laundry or work area in [employer]: None      Prior Function Level of Independence: Independent               Journalist, newspaper        Extremity/Trunk Assessment        Lower Extremity Assessment Lower Extremity Assessment:  Generalized weakness    Cervical / Trunk Assessment Cervical / Trunk Assessment: Other exceptions Cervical / Trunk Exceptions: cachectic appearance  Communication   Communication: No difficulties  Cognition Arousal/Alertness: Awake/alert Behavior During Therapy: WFL for tasks assessed/performed Overall Cognitive Status: Within Functional Limits for tasks assessed                                        General Comments      Exercises Other Exercises Other  Exercises: 5 sit to stands attempting to not use UEs to self assist   Assessment/Plan    PT Assessment All further PT needs can be met in the next venue of care  PT Problem List Decreased mobility;Decreased activity tolerance;Decreased knowledge of use of DME;Decreased strength;Decreased balance       PT Treatment Interventions      PT Goals (Current goals can be found in the Care Plan section)  Acute Rehab PT Goals PT Goal Formulation: All assessment and education complete, DC therapy    Frequency     Barriers to discharge        Co-evaluation               AM-PAC PT "6 Clicks" Mobility  Outcome Measure Help needed turning from your back to your side while in a flat bed without using bedrails?: None Help needed moving from lying on your back to sitting on the side of a flat bed without using bedrails?: None Help needed moving to and from a bed to a chair (including a wheelchair)?: None Help needed standing up from a chair using your arms (e.g., wheelchair or bedside chair)?: A Little Help needed to walk in hospital room?: A Little Help needed climbing 3-5 steps with a railing? : A Little 6 Click Score: 21    End of Session   Activity Tolerance: Patient tolerated treatment well Patient left: in chair;with call bell/phone within reach;with chair alarm set Nurse Communication: Mobility status PT Visit Diagnosis: Difficulty in walking, not elsewhere classified (R26.2);Muscle weakness (generalized) (M62.81)    Time: 5093-2671 PT Time Calculation (min) (ACUTE ONLY): 27 min   Charges:   PT Evaluation $PT Eval Low Complexity: 1 Low PT Treatments $Gait Training: 8-22 mins      Jannette Spanner PT, DPT Acute Rehabilitation Services Pager: [phone] Office: [place] 03/03/2021, 12:03 PM

## 2021-03-03 NOTE — Progress Notes (Signed)
[name], wife updated on the patient condition

## 2021-03-03 NOTE — Discharge Summary (Signed)
Physician Discharge Summary         Patient ID: Christopher Harding MRN: [id number] DOB/AGE: 07-30-1950 70 y.o.  Admit date: 02/28/2021 Discharge date: 03/03/2021  Discharge Diagnoses:   Acute hypoxemic respiratory failure Encephalopathy/ psychomotor agitation Atrial fibrillation with rapid ventricular response Leukopenia/neutropenia Urinary retention Chronic cancer pain  Discharge summary    70 year old male with prior history of tobacco abuse, ETOH use, Afib on Xarelto, depression, HTN, and metastatic (to liver) adenocarcinoma lung cancer (currently on systemic palliative carboplatin, alimta, and [place]) who presented to [facility] ED on 9/24 with complaints of progressive dyspnea with panic reaction with productive cough of clear mucous, 2-3 days of diarrhea, and worsening dysphagia.  Found to be hypoxic at 70% on room air improved with oxygen and nebs.  Became combative and belligerent in ER without significant improvement despite doses of Zyprexa, Versed and Dilaudid; therefore placed on precedex gtt with significant improvement.  CTA PE was negative for pulmonary embolism and redemonstrated previously identified consolidation and bandlike density in the right upper lobe and superior segment of right lower lobe, small right-sided pleural effusion, and other punctate bilateral pulmonary nodules, along with a large metastatic lesion in the left hepatic lobe.  CT head was negative for an acute intracranial process.  Labs noted at WBC 1.3, Hgb 9.8-> 7.4, plt 147, Mag 1.3, K 3, Mag 1.3, normal sCr, BNP 69, trop hs 9, lactic 3.8 corrected to 1.1 with supportive fluids.  Started empirically on azithro and ceftriaxone given hypoxia on presentation.   He was admitted to [facility].  His mental status improved and he was able to be weaned off precedex overnight 9/25.  On the early morning of 9/26, he developed afib with RVR with hypotension which resolved with amiodarone bolus and fluid bolus.  He remained  hemodynamically stable, on room air, and remained off precedex with good mental status.  He complained of dysuria and found to have urinary retention > 1L.  A foley was placed and he was started on flomax. Palliative care was consulted and seen patient.  MOST form filled out, but no CPR or intubation, bipap only if needed.  Patient continues to be alert/ oriented, hemodynamically stable, and ready discharge home.     Discharge Plan by Active Problems    Acute hypoxemic respiratory failure: Resolved.  Unclear etiology.  Chest CT largely unchanged. - Antibiotics stopped 9/26 - Pleural effusion is small, not a contributor to hypoxemia or symptoms    A. fib with RVR: Chronic.  Acute SVT/RVR overnight.  Responded to bolus amnio. -- Xarelto and metoprolol resumed 9/26    Leukopenia/neutropenia: Presumably due to recent chemotherapy, neutropenic as well. - Granix per oncology -  trend CBC per oncology     Urinary retention: Unclear cause, BPH?  1 L 9/26 on bladder scan. -  Flomax started 9/26 - will change to U-bag for home - will need output follow up with urology for voiding trial.  Appoint scheduled with Urology, Dr. Tresa Moore on 03/09/2021 at 0900 AM    Cancer pain:  - Restarted home MS Contin 9/26. Resume home Oxy IR at home    Encephalopathy/ psychomotor agitation - unclear etiology but resolved after 24hrs.  Responded well to precedex gtt.     Significant Hospital tests/ studies   CTa chest 0/24 1. No acute pulmonary embolus is seen. 2. Similar consolidation and bandlike density in the right upper lobe and superior segment of right lower lobe. Small right-sided pleural effusion with loculation superiorly,  this is stable to minimally increased in size. Other punctate bilateral pulmonary nodules are unchanged. 3. Incompletely visualized large metastatic lesion in the left hepatic lobe   [facility] 9/24  1. No CT evidence for acute intracranial abnormality. Atrophy and chronic  small-vessel ischemic changes of the white matter. Negative for enhancing mass lesion. 2. 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  Procedures   9/26 Foley >>  Culture data/antimicrobials   9/24 ceftriaxone> 9/26 9/24 azthro > 9/26 9/24 vanc   Micro studies: - Viral resp panel 9/24  neg flu/ covid 19  - MRSA PCR screen 9/25 neg - BC x 2  9/25 > ngtd - UC 9/25 > neg   Consults  Palliative Care    Discharge Exam: BP 138/85 (BP Location: Left Arm)   Pulse 91   Temp 98.4 F (36.9 C) (Oral)   Resp 16   Ht 6\' 2"  (1.88 m)   Wt 59.9 kg   SpO2 100%   BMI 16.95 kg/m   General:  Pleasant adult male sitting upright in bed in NAD HEENT: MM pink/moist Neuro: Alert/ oriented x 3, MAE CV: rr PULM:  non labored, CTA GI: soft, bs+, NT, foley Extremities: warm/dry, no LE edema  Skin: no rashes   Labs at discharge   Lab Results  Component Value Date   CREATININE 0.61 03/03/2021   BUN 8 03/03/2021   NA 132 (L) 03/03/2021   K 3.4 (L) 03/03/2021   CL 104 03/03/2021   CO2 21 (L) 03/03/2021   Lab Results  Component Value Date   WBC 2.8 (L) 03/03/2021   HGB 8.0 (L) 03/03/2021   HCT 23.0 (L) 03/03/2021   MCV 90.2 03/03/2021   PLT 223 03/03/2021   Lab Results  Component Value Date   ALT 10 02/28/2021   AST 21 02/28/2021   ALKPHOS 107 02/28/2021   BILITOT 1.2 02/28/2021   Lab Results  Component Value Date   INR 1.2 02/28/2021   INR 1.1 11/24/2020   INR 1.1 08/18/2020    Current radiological studies    No results found.  Disposition:   Home with spouse  Evaluated by PT and OT with no needs/ further recs.   Discharge Instructions     Call MD for:  difficulty breathing, headache or visual disturbances   Complete by: As directed    Call MD for:  persistant dizziness or light-headedness   Complete by: As directed    Call MD for:  persistant nausea and vomiting   Complete by: As directed    Call MD for:  redness, tenderness, or signs of infection (pain,  swelling, redness, odor or green/yellow discharge around incision site)   Complete by: As directed    Call MD for:  severe uncontrolled pain   Complete by: As directed    Call MD for:  temperature >100.4   Complete by: As directed    Diet general   Complete by: As directed    Discharge instructions   Complete by: As directed    Please clean foley site with warm soapy water three times a day and as needed.  Please do not pull on catheter.   Increase activity slowly   Complete by: As directed    No wound care   Complete by: As directed        Allergies as of 03/03/2021   No Known Allergies      Medication List     TAKE these medications  folic acid 1 MG tablet Commonly known as: FOLVITE Take 1 tablet (1 mg total) by mouth daily.   megestrol 625 MG/5ML suspension Commonly known as: MEGACE ES Take 5 mLs (625 mg total) by mouth daily.   metoprolol tartrate 50 MG tablet Commonly known as: LOPRESSOR Take 50 mg by mouth 2 (two) times daily with a meal.   mirtazapine 30 MG tablet Commonly known as: REMERON TAKE 1 TABLET BY MOUTH AT BEDTIME.   morphine 30 MG 12 hr tablet Commonly known as: MS CONTIN Take 1 tablet (30 mg total) by mouth every 12 (twelve) hours.   oxyCODONE 5 MG immediate release tablet Commonly known as: Oxy IR/ROXICODONE Take 1 tablet (5 mg total) by mouth 3 (three) times daily as needed for severe pain.   polyethylene glycol 17 g packet Commonly known as: MIRALAX / GLYCOLAX Take 17 g by mouth daily.   prochlorperazine 10 MG tablet Commonly known as: COMPAZINE Take 1 tablet (10 mg total) by mouth every 6 (six) hours as needed for nausea or vomiting.   tamsulosin 0.4 MG Caps capsule Commonly known as: FLOMAX Take 1 capsule (0.4 mg total) by mouth daily for 7 days. Start taking on: March 04, 2021   Xarelto 20 MG Tabs tablet Generic drug: rivaroxaban Take 20 mg by mouth daily.         Follow-up appointment    Oncology Dr. Julien Nordmann on  10/4 at 1130am  Urology - Dr. Tresa Moore with [facility] on 03/09/2021 at 0900 AM, arrive at [facility] for registration   Discharge Condition:    stable    Time spent in discharge: 50 mins    Kennieth Rad, ACNP [facility] 03/03/2021, 3:32 PM  See Amion for pager If no response to pager, please call PCCM consult pager After 7:00 pm call [place]

## 2021-03-03 NOTE — Care Management Important Message (Signed)
Important Message  Patient Details IM Letter given to the Patient. Name: DELTA DESHMUKH MRN: [id number] Date of Birth: 1951/03/02   Medicare Important Message Given:  Yes     Kerin Salen 03/03/2021, 10:27 AM

## 2021-03-03 NOTE — Telephone Encounter (Signed)
err

## 2021-03-03 NOTE — Progress Notes (Signed)
HEMATOLOGY-ONCOLOGY PROGRESS NOTE  SUBJECTIVE: Mr. Christopher Harding tells me that he feels better today.  He was working with physical therapy just prior to my visit.  He is not having any fevers or chills.  He does not really recall what brought him into the hospital.  He tells me that he hopes that he will go home today.  Oncology History  Adenocarcinoma, lung, right ([place])  11/13/2019 Initial Diagnosis   Adenocarcinoma, lung, right ([place])   07/15/2020 Cancer Staging   Staging form: Lung, AJCC 8th Edition - Clinical: Stage IVB (cT3, cN2, pM1c) - Signed by Curt Bears, MD on 01/28/2021   Adenocarcinoma of right lung, stage 3 ([place])  11/13/2019 Initial Diagnosis   Adenocarcinoma of right lung, stage 3 ([place])   11/26/2019 - 01/07/2020 Chemotherapy   The patient had palonosetron (ALOXI) injection 0.25 mg, 0.25 mg, Intravenous,  Once, 7 of 7 cycles Administration: 0.25 mg (11/26/2019), 0.25 mg (12/24/2019), 0.25 mg (12/31/2019), 0.25 mg (12/03/2019), 0.25 mg (01/07/2020), 0.25 mg (12/11/2019), 0.25 mg (12/17/2019) CARBOplatin (PARAPLATIN) 200 mg in sodium chloride 0.9 % 100 mL chemo infusion, 200 mg (100 % of original dose 200.4 mg), Intravenous,  Once, 7 of 7 cycles Dose modification: 200.4 mg (original dose 200.4 mg, Cycle 1) Administration: 200 mg (11/26/2019), 200 mg (12/24/2019), 200 mg (12/31/2019), 200 mg (12/03/2019), 200 mg (01/07/2020), 200 mg (12/11/2019), 200 mg (12/17/2019) PACLitaxel (TAXOL) 90 mg in sodium chloride 0.9 % 250 mL chemo infusion (</= $RemoveBefor'80mg'QYgwXPsYecrb$ /m2), 45 mg/m2 = 90 mg, Intravenous,  Once, 7 of 7 cycles Administration: 90 mg (11/26/2019), 90 mg (12/24/2019), 90 mg (12/31/2019), 90 mg (12/03/2019), 90 mg (01/07/2020), 90 mg (12/11/2019), 90 mg (12/17/2019)   for chemotherapy treatment.     02/19/2020 - 07/15/2020 Chemotherapy          06/17/2020 Cancer Staging   Staging form: Lung, AJCC 8th Edition - Clinical: Stage IIIB (cT3, cN2, cM0) - Signed by Curt Bears, MD on 06/17/2020   01/22/2021 -  Chemotherapy     Patient is on Treatment Plan: LUNG CARBOPLATIN / PEMETREXED / PEMBROLIZUMAB Q21D INDUCTION X 4 CYCLES / MAINTENANCE PEMETREXED + PEMBROLIZUMAB          REVIEW OF SYSTEMS:   Constitutional: Denies fevers, chills  Eyes: Denies blurriness of vision Ears, nose, mouth, throat, and face: Denies mucositis or sore throat Respiratory: Denies cough, dyspnea or wheezes Cardiovascular: Denies palpitation, chest discomfort Gastrointestinal:  Denies nausea, heartburn  Skin: Denies abnormal skin rashes Lymphatics: Denies new lymphadenopathy or easy bruising Neurological:Denies numbness, tingling or new weaknesses Behavioral/Psych: Mood is stable, no new changes  Extremities: No lower extremity edema All other systems were reviewed with the patient and are negative.  I have reviewed the past medical history, past surgical history, social history and family history with the patient and they are unchanged from previous note.   PHYSICAL EXAMINATION: ECOG PERFORMANCE STATUS: 2 - Symptomatic, <50% confined to bed  Vitals:   03/03/21 0950 03/03/21 1416  BP: 113/70 138/85  Pulse: 79 91  Resp: 18 16  Temp: 98.4 F (36.9 C) 98.4 F (36.9 C)  SpO2: 100%    Filed Weights   02/28/21 1357 03/02/21 0500 03/03/21 0440  Weight: 58.5 kg 59.9 kg 59.9 kg    Intake/Output from previous day: 09/26 0701 - 09/27 0700 In: 691.1 [P.O.:300; I.V.:391.1] Out: 1135 [Urine:1135]  GENERAL: Chronically ill-appearing male, no distress SKIN: skin color, texture, turgor are normal, no rashes or significant lesions EYES: normal, Conjunctiva are pink and non-injected, sclera clear LUNGS:  normal breathing effort ABDOMEN:abdomen soft, non-tender and normal bowel sounds  NEURO: alert & oriented x 3 with fluent speech, no focal motor/sensory deficits  LABORATORY DATA:  I have reviewed the data as listed CMP Latest Ref Rng & Units 03/03/2021 03/02/2021 03/01/2021  Glucose 70 - 99 mg/dL 75 84 115(H)  BUN 8 - 23  mg/dL 8 7(L) 7(L)  Creatinine 0.61 - 1.24 mg/dL 0.61 0.63 0.61  Sodium 135 - 145 mmol/L 132(L) 134(L) 134(L)  Potassium 3.5 - 5.1 mmol/L 3.4(L) 4.1 3.4(L)  Chloride 98 - 111 mmol/L 104 105 101  CO2 22 - 32 mmol/L 21(L) 17(L) 22  Calcium 8.9 - 10.3 mg/dL 8.3(L) 8.4(L) 8.6(L)  Total Protein 6.5 - 8.1 g/dL - - -  Total Bilirubin 0.3 - 1.2 mg/dL - - -  Alkaline Phos 38 - 126 U/L - - -  AST 15 - 41 U/L - - -  ALT 0 - 44 U/L - - -    Lab Results  Component Value Date   WBC 2.8 (L) 03/03/2021   HGB 8.0 (L) 03/03/2021   HCT 23.0 (L) 03/03/2021   MCV 90.2 03/03/2021   PLT 223 03/03/2021   NEUTROABS 1.1 (L) 03/03/2021    CT HEAD W & WO CONTRAST (5MM)  Result Date: 02/28/2021 CLINICAL DATA:  History of lung cancer, altered EXAM: CT HEAD WITHOUT AND WITH CONTRAST TECHNIQUE: Contiguous axial images were obtained from the base of the skull through the vertex without and with intravenous contrast CONTRAST:  49mL OMNIPAQUE IOHEXOL 350 MG/ML SOLN COMPARISON:  MRI 12/09/2020 FINDINGS: Brain: No acute territorial infarction, hemorrhage, or intracranial mass is visualized. Mild atrophy and chronic small vessel ischemic changes of the white matter. Stable ventricle size. No abnormal focal contrast enhancement within the brain parenchyma. Vascular: No hyperdense vessels. 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 Skull: Normal. Negative for fracture or focal lesion. Sinuses/Orbits: No acute finding. Other: None IMPRESSION: 1. No CT evidence for acute intracranial abnormality. Atrophy and chronic small-vessel ischemic changes of the white matter. Negative for enhancing mass lesion. 2. 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 Electronically Signed   By: Donavan Foil M.D.   On: 02/28/2021 21:59   CT Angio Chest PE W/Cm &/Or Wo Cm  Result Date: 02/28/2021 CLINICAL DATA:  History of lung cancer hypoxia altered EXAM: CT ANGIOGRAPHY CHEST WITH CONTRAST TECHNIQUE: Multidetector CT imaging of the chest  was performed using the standard protocol during bolus administration of intravenous contrast. Multiplanar CT image reconstructions and MIPs were obtained to evaluate the vascular anatomy. CONTRAST:  7mL OMNIPAQUE IOHEXOL 350 MG/ML SOLN COMPARISON:  Chest x-ray 02/28/2021, CT chest 01/12/2021, 11/06/2020, 08/08/2020, PET CT 11/12/2018 FINDINGS: Cardiovascular: Satisfactory opacification of the pulmonary arteries to the segmental level. No evidence of pulmonary embolism. Moderate aortic atherosclerosis. No aneurysm. Coronary vascular calcification. Normal cardiac size. No pericardial effusion Mediastinum/Nodes: Midline trachea. No thyroid. No increasing adenopathy. Esophagus within normal limits. Lungs/Pleura: Small right-sided pleural effusion with loculation at the apex is stable to minimally increased in size. Bandlike consolidation in the right upper lobe and superior segment of right lower lobe grossly similar in appearance. 4 mm left upper lobe lung nodule, series 4, image 58, previously 4 mm. Stable densely calcified nodule in the right lung base. Stable 3 mm left posterior subpleural lung nodule series 4, image 18. Stable 3 mm right subpleural lower lobe lung nodule, series 4, image 115. Upper Abdomen: Incompletely visualized large hepatic metastatic lesion in  the left hepatic lobe. Musculoskeletal: No acute or suspicious osseous abnormality Review of the MIP images confirms the above findings. IMPRESSION: 1. No acute pulmonary embolus is seen. 2. Similar consolidation and bandlike density in the right upper lobe and superior segment of right lower lobe. Small right-sided pleural effusion with loculation superiorly, this is stable to minimally increased in size. Other punctate bilateral pulmonary nodules are unchanged. 3. Incompletely visualized large metastatic lesion in the left hepatic lobe Aortic Atherosclerosis (ICD10-I70.0). Electronically Signed   By: Donavan Foil M.D.   On: 02/28/2021 21:51   DG  Chest Port 1 View  Result Date: 02/28/2021 CLINICAL DATA:  Questionable sepsis - evaluate for abnormality history lung cancer EXAM: PORTABLE CHEST 1 VIEW COMPARISON:  Oct 30, 2019, November 06, 2020 FINDINGS: The cardiomediastinal silhouette is unchanged in contour.Revisualization an irregular contour of the RIGHT upper lung along the mediastinal border, similar in comparison to prior. Asymmetric RIGHT apical pleural thickening, unchanged nodular opacity overlying the RIGHT costophrenic angle, unchanged and consistent with a calcified granuloma. No pleural effusion. No pneumothorax. No acute pleuroparenchymal abnormality. Visualized abdomen is unremarkable. IMPRESSION: No new focal consolidation. Electronically Signed   By: Valentino Saxon M.D.   On: 02/28/2021 14:09    ASSESSMENT AND PLAN: This is a 70 year old male with metastatic non-small cell lung cancer, adenocarcinoma.  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  He initially received treatment with concurrent chemoradiation with weekly carboplatin for an AUC of 2 and paclitaxel 45 mg per metered squared status post 7 cycles and had a partial response to treatment.  He then received consolidation immunotherapy with Imfinzi 1500 mg IV every 4 weeks and is status post 6 cycles.  Treatment was discontinued secondary to disease progression.  He then developed a suspicious liver lesion concerning for metastatic disease which was biopsied and read at [facility] in addition to [facility] which showed no evidence of malignancy and there was suspicious inflammatory process of biliary origin.  The patient was then on observation but the liver lesion continued to grow in size.  He underwent a second liver biopsy and pathology was consistent with metastatic non-small cell lung cancer, poorly differentiated carcinoma favoring squamous cell.  Molecular studies from the liver biopsy showed no actionable mutations and it was unclear the discrepancy between the previous lung biopsy  molecular study as well as liver biopsy molecular studies except the new liver lesion in the liver is consistent with poorly differentiated squamous cell carcinoma compared to the lung biopsy that was adenocarcinoma at that time.  He was then started on second line treatment with Lumakras 960 mg daily and was on this for 1 month but repeat imaging showed evidence of disease progression in the liver so this was discontinued.  He is now receiving palliative systemic chemotherapy with carboplatin for an AUC of 5, Alimta 500 mg per metered squared, and Keytruda 200 mg IV every 3 weeks.  Status post 2 cycles.  He has been experiencing fatigue, lack of appetite, and weight loss on chemotherapy.  He has now been admitted for acute hypoxic respiratory failure and A. fib with RVR.  The patient continues to feel better.  His respiratory status has improved.  He work with therapy this morning and states that he feels better and would like to go home.  I have reviewed his labs from today which show improvement in his WBC to 2.8 with ANC of 1.1, hemoglobin of 8.0, and platelet count of 223,000.  Recommend that he  receive another dose of Granix 300 mcg subcu today and then stop.  No need for PRBC or platelet transfusion today.  I sent a scheduling message to our office on 9/26 to reschedule his appointments.  Our scheduling office is still working on this.  He was scheduled for his next cycle of chemotherapy later this week and I have asked him to delay his treatment by 1 week to allow him additional time to recover.  Our office will call him with his new dates and times for his appointments.  Okay to discharge from our standpoint if otherwise medically stable.   Future Appointments  Date Time Provider [place]  03/04/2021 12:00 PM Morrell Riddle, RD [facility] None  03/10/2021 11:00 AM [facility] LAB [facility] None  03/10/2021 11:30 AM Curt Bears, MD [facility] None  03/19/2021 11:30 AM  [facility] LAB [facility] None  03/25/2021 11:00 AM [facility] LAB [facility] None  03/25/2021 11:30 AM Heilingoetter, Cassandra L, PA-C [facility] None  03/25/2021 12:30 PM [facility] INFUSION [facility] None  04/02/2021 11:30 AM [facility] LAB [facility] None  04/09/2021 11:30 AM [facility] LAB [facility] None  04/15/2021  9:30 AM [facility] LAB [facility] None  04/15/2021 10:00 AM Curt Bears, MD [facility] None  04/15/2021 11:00 AM [facility] INFUSION [facility] None      LOS: 3 days   Mikey Bussing, DNP, AGPCNP-BC, AOCNP 03/03/21

## 2021-03-03 NOTE — Evaluation (Signed)
Occupational Therapy Evaluation Patient Details Name: Christopher Harding MRN: [id number] DOB: 1951-03-30 Today's Date: 03/03/2021   History of Present Illness 70 year old man with lung cancer admitted with hypoxemia.  Most recent chemotherapy 02/12/2021.  Unclear cause.  CT chest without new acute finding, chronic changes present on previous scans.  No clear reason for hypoxemia.  Seems to have resolved, on room air.  PMHx: Tobacco abuse, ETOH use, Afib, depression, HTN, metastatic (liver) adenocarcinoma lung cancer (currently on systemic palliative carboplatin, alimta, and keytruda)   Clinical Impression   Patient evaluated by Occupational Therapy with no further acute OT needs identified. All education has been completed and the patient has no further questions. Patient appears to be at baseline for ADLs with SUP for mobility in room. Patient endorses being back to baseline at this time.  Patient would benefit from SUP at home. See below for any follow-up Occupational Therapy or equipment needs. OT is signing off. Thank you for this referral.       Recommendations for follow up therapy are one component of a multi-disciplinary discharge planning process, led by the attending physician.  Recommendations may be updated based on patient status, additional functional criteria and insurance authorization.   Follow Up Recommendations  No OT follow up;Supervision - Intermittent    Equipment Recommendations  None recommended by OT    Recommendations for Other Services       Precautions / Restrictions Precautions Precautions: Fall Restrictions Weight Bearing Restrictions: No      Mobility Bed Mobility Overal bed mobility: Modified Independent                  Transfers Overall transfer level: Needs assistance Equipment used: None Transfers: Sit to/from Stand Sit to Stand: Supervision         General transfer comment: patient has foley cath in place at this time. patient  forgets that it is present requiring assistance to manage. patient needed sup to navigate in tighter spaces in room with no AD.    Balance Overall balance assessment: Needs assistance   Sitting balance-Leahy Scale: Good     Standing balance support: No upper extremity supported;During functional activity Standing balance-Leahy Scale: Good                             ADL either performed or assessed with clinical judgement   ADL Overall ADL's : At baseline                                       General ADL Comments: patient was able to complete transfers in room, brushing teeth at the sink, LB dressing tasks with sup on this date. patient endources being at baseline for mobility. patient's family was present for session.     Vision Baseline Vision/History: 1 Wears glasses Ability to See in Adequate Light: 1 Impaired Patient Visual Report: No change from baseline       Perception     Praxis      Pertinent Vitals/Pain Pain Assessment: No/denies pain     Hand Dominance Right   Extremity/Trunk Assessment Upper Extremity Assessment Upper Extremity Assessment: Overall WFL for tasks assessed   Lower Extremity Assessment Lower Extremity Assessment: Defer to PT evaluation   Cervical / Trunk Assessment Cervical / Trunk Assessment: Other exceptions Cervical / Trunk Exceptions: cachectic appearance   Communication Communication Communication:  No difficulties   Cognition Arousal/Alertness: Awake/alert Behavior During Therapy: WFL for tasks assessed/performed Overall Cognitive Status: Within Functional Limits for tasks assessed                                 General Comments: patient was a little irritable on first approach but plesant on second approach   General Comments       Exercises Other Exercises Other Exercises: 5 sit to stands attempting to not use UEs to self assist   Shoulder Instructions      Home Living  Family/patient expects to be discharged to:: Private residence Living Arrangements: Spouse/significant other   Type of Home: House       Home Layout: Able to live on main level with bedroom/bathroom;Laundry or work area in basement     [employer] Shower/Tub: [employer]: None          Prior Functioning/Environment Level of Independence: Independent                 OT Problem List:        OT Treatment/Interventions:      OT Goals(Current goals can be found in the care plan section) Acute Rehab OT Goals Patient Stated Goal: to go home today OT Goal Formulation: All assessment and education complete, DC therapy  OT Frequency:     Barriers to D/C:            Co-evaluation              AM-PAC OT "6 Clicks" Daily Activity     Outcome Measure Help from another person eating meals?: None Help from another person taking care of personal grooming?: None Help from another person toileting, which includes using toliet, bedpan, or urinal?: None Help from another person bathing (including washing, rinsing, drying)?: None Help from another person to put on and taking off regular upper body clothing?: None Help from another person to put on and taking off regular lower body clothing?: None 6 Click Score: 24   End of Session Equipment Utilized During Treatment: Gait belt  Activity Tolerance: Patient tolerated treatment well Patient left: in chair;with call bell/phone within reach;with chair alarm set;with family/visitor present  OT Visit Diagnosis: Unsteadiness on feet (R26.81)                Time: 4765-4650 OT Time Calculation (min): 20 min Charges:  OT General Charges $OT Visit: 1 Visit OT Evaluation $OT Eval Low Complexity: 1 Low  Jackelyn Poling OTR/L, MS Acute Rehabilitation Department Office# [phone] Pager# [phone]   [place] 03/03/2021, 12:19 PM

## 2021-03-04 ENCOUNTER — Inpatient Hospital Stay: Payer: Medicare Other | Admitting: Dietician

## 2021-03-04 ENCOUNTER — Inpatient Hospital Stay: Payer: Medicare Other

## 2021-03-04 ENCOUNTER — Inpatient Hospital Stay: Payer: Medicare Other | Admitting: Internal Medicine

## 2021-03-05 ENCOUNTER — Other Ambulatory Visit: Payer: Medicare Other

## 2021-03-05 ENCOUNTER — Ambulatory Visit: Payer: Medicare Other | Admitting: Physician Assistant

## 2021-03-05 ENCOUNTER — Ambulatory Visit: Payer: Medicare Other

## 2021-03-05 ENCOUNTER — Ambulatory Visit: Payer: Medicare Other | Admitting: Internal Medicine

## 2021-03-05 LAB — CULTURE, BLOOD (SINGLE)
Culture: NO GROWTH
Special Requests: ADEQUATE

## 2021-03-06 LAB — CULTURE, BLOOD (ROUTINE X 2)
Culture: NO GROWTH
Special Requests: ADEQUATE

## 2021-03-09 ENCOUNTER — Encounter: Payer: Self-pay | Admitting: Internal Medicine

## 2021-03-09 DIAGNOSIS — R338 Other retention of urine: Secondary | ICD-10-CM | POA: Diagnosis not present

## 2021-03-10 ENCOUNTER — Ambulatory Visit: Payer: Medicare Other | Admitting: Internal Medicine

## 2021-03-10 ENCOUNTER — Other Ambulatory Visit: Payer: Medicare Other

## 2021-03-10 ENCOUNTER — Inpatient Hospital Stay (HOSPITAL_BASED_OUTPATIENT_CLINIC_OR_DEPARTMENT_OTHER): Payer: Medicare Other | Admitting: Internal Medicine

## 2021-03-10 ENCOUNTER — Inpatient Hospital Stay: Payer: Medicare Other

## 2021-03-10 ENCOUNTER — Other Ambulatory Visit: Payer: Self-pay

## 2021-03-10 ENCOUNTER — Inpatient Hospital Stay: Payer: Medicare Other | Admitting: Dietician

## 2021-03-10 ENCOUNTER — Inpatient Hospital Stay: Payer: Medicare Other | Attending: Physician Assistant

## 2021-03-10 VITALS — BP 111/78 | HR 65 | Temp 97.7°F | Resp 18 | Ht 74.0 in | Wt 133.3 lb

## 2021-03-10 DIAGNOSIS — C3491 Malignant neoplasm of unspecified part of right bronchus or lung: Secondary | ICD-10-CM

## 2021-03-10 DIAGNOSIS — Z7901 Long term (current) use of anticoagulants: Secondary | ICD-10-CM | POA: Diagnosis not present

## 2021-03-10 DIAGNOSIS — C3490 Malignant neoplasm of unspecified part of unspecified bronchus or lung: Secondary | ICD-10-CM | POA: Diagnosis not present

## 2021-03-10 DIAGNOSIS — G4700 Insomnia, unspecified: Secondary | ICD-10-CM | POA: Diagnosis not present

## 2021-03-10 DIAGNOSIS — C3411 Malignant neoplasm of upper lobe, right bronchus or lung: Secondary | ICD-10-CM | POA: Diagnosis not present

## 2021-03-10 DIAGNOSIS — I4891 Unspecified atrial fibrillation: Secondary | ICD-10-CM | POA: Diagnosis not present

## 2021-03-10 DIAGNOSIS — Z79899 Other long term (current) drug therapy: Secondary | ICD-10-CM | POA: Diagnosis not present

## 2021-03-10 DIAGNOSIS — F32A Depression, unspecified: Secondary | ICD-10-CM | POA: Insufficient documentation

## 2021-03-10 DIAGNOSIS — Z5111 Encounter for antineoplastic chemotherapy: Secondary | ICD-10-CM | POA: Diagnosis not present

## 2021-03-10 DIAGNOSIS — K769 Liver disease, unspecified: Secondary | ICD-10-CM | POA: Insufficient documentation

## 2021-03-10 LAB — CMP (CANCER CENTER ONLY)
ALT: <5 U/L (ref 0–44)
AST: 16 U/L (ref 15–41)
Albumin: 2.8 g/dL — ABNORMAL LOW (ref 3.5–5.0)
Alkaline Phosphatase: 96 U/L (ref 38–126)
Anion gap: 10 (ref 5–15)
BUN: 8 mg/dL (ref 8–23)
CO2: 23 mmol/L (ref 22–32)
Calcium: 8.6 mg/dL — ABNORMAL LOW (ref 8.9–10.3)
Chloride: 100 mmol/L (ref 98–111)
Creatinine: 0.67 mg/dL (ref 0.61–1.24)
GFR, Estimated: >60 mL/min (ref >60)
Glucose, Bld: 102 mg/dL — ABNORMAL HIGH (ref 70–99)
Potassium: 3.8 mmol/L (ref 3.5–5.1)
Sodium: 133 mmol/L — ABNORMAL LOW (ref 135–145)
Total Bilirubin: 0.5 mg/dL (ref 0.3–1.2)
Total Protein: 6.1 g/dL — ABNORMAL LOW (ref 6.5–8.1)

## 2021-03-10 LAB — CBC WITH DIFFERENTIAL (CANCER CENTER ONLY)
Abs Immature Granulocytes: 0.59 10*3/uL — ABNORMAL HIGH (ref 0.00–0.07)
Basophils Absolute: 0 10*3/uL (ref 0.0–0.1)
Basophils Relative: 0 %
Eosinophils Absolute: 0 10*3/uL (ref 0.0–0.5)
Eosinophils Relative: 0 %
HCT: 26.9 % — ABNORMAL LOW (ref 39.0–52.0)
Hemoglobin: 9.2 g/dL — ABNORMAL LOW (ref 13.0–17.0)
Immature Granulocytes: 6 %
Lymphocytes Relative: 6 %
Lymphs Abs: 0.6 10*3/uL — ABNORMAL LOW (ref 0.7–4.0)
MCH: 31.5 pg (ref 26.0–34.0)
MCHC: 34.2 g/dL (ref 30.0–36.0)
MCV: 92.1 fL (ref 80.0–100.0)
Monocytes Absolute: 1.4 10*3/uL — ABNORMAL HIGH (ref 0.1–1.0)
Monocytes Relative: 15 %
Neutro Abs: 6.7 10*3/uL (ref 1.7–7.7)
Neutrophils Relative %: 73 %
Platelet Count: 266 10*3/uL (ref 150–400)
RBC: 2.92 MIL/uL — ABNORMAL LOW (ref 4.22–5.81)
RDW: 17.2 % — ABNORMAL HIGH (ref 11.5–15.5)
WBC Count: 9.3 10*3/uL (ref 4.0–10.5)
nRBC: 0 % (ref 0.0–0.2)

## 2021-03-10 LAB — TSH: TSH: 4.159 u[IU]/mL — ABNORMAL HIGH (ref 0.320–4.118)

## 2021-03-10 MED ORDER — SODIUM CHLORIDE 0.9 % IV SOLN
10.0000 mg | Freq: Once | INTRAVENOUS | Status: AC
  Administered 2021-03-10: 10 mg via INTRAVENOUS
  Filled 2021-03-10: qty 10

## 2021-03-10 MED ORDER — PALONOSETRON HCL INJECTION 0.25 MG/5ML
0.2500 mg | Freq: Once | INTRAVENOUS | Status: AC
  Administered 2021-03-10: 0.25 mg via INTRAVENOUS
  Filled 2021-03-10: qty 5

## 2021-03-10 MED ORDER — SODIUM CHLORIDE 0.9 % IV SOLN
500.0000 mg/m2 | Freq: Once | INTRAVENOUS | Status: AC
  Administered 2021-03-10: 900 mg via INTRAVENOUS
  Filled 2021-03-10: qty 20

## 2021-03-10 MED ORDER — SODIUM CHLORIDE 0.9 % IV SOLN
150.0000 mg | Freq: Once | INTRAVENOUS | Status: AC
  Administered 2021-03-10: 150 mg via INTRAVENOUS
  Filled 2021-03-10: qty 150

## 2021-03-10 MED ORDER — SODIUM CHLORIDE 0.9 % IV SOLN
200.0000 mg | Freq: Once | INTRAVENOUS | Status: AC
  Administered 2021-03-10: 200 mg via INTRAVENOUS
  Filled 2021-03-10: qty 8

## 2021-03-10 MED ORDER — SODIUM CHLORIDE 0.9 % IV SOLN: Freq: Once | INTRAVENOUS | Status: AC

## 2021-03-10 MED ORDER — SODIUM CHLORIDE 0.9 % IV SOLN
437.5000 mg | Freq: Once | INTRAVENOUS | Status: AC
  Administered 2021-03-10: 440 mg via INTRAVENOUS
  Filled 2021-03-10: qty 44

## 2021-03-10 MED ORDER — CYANOCOBALAMIN 1000 MCG/ML IJ SOLN
1000.0000 ug | Freq: Once | INTRAMUSCULAR | Status: AC
  Administered 2021-03-10: 1000 ug via INTRAMUSCULAR
  Filled 2021-03-10: qty 1

## 2021-03-10 NOTE — Patient Instructions (Signed)
[facility]  Discharge Instructions: Thank you for choosing [facility] to provide your oncology and hematology care.   If you have a lab appointment with the [facility], please go directly to the [facility] and check in at the registration area.   Wear comfortable clothing and clothing appropriate for easy access to any Portacath or PICC line.   We strive to give you quality time with your provider. You may need to reschedule your appointment if you arrive late (15 or more minutes).  Arriving late affects you and other patients whose appointments are after yours.  Also, if you miss three or more appointments without notifying the office, you may be dismissed from the clinic at the provider's discretion.      For prescription refill requests, have your pharmacy contact our office and allow 72 hours for refills to be completed.    Today you received the following chemotherapy and/or immunotherapy agents: Keytruda, Alimta, Carboplatin   To help prevent nausea and vomiting after your treatment, we encourage you to take your nausea medication as directed.  BELOW ARE SYMPTOMS THAT SHOULD BE REPORTED IMMEDIATELY: *FEVER GREATER THAN 100.4 F (38 C) OR HIGHER *CHILLS OR SWEATING *NAUSEA AND VOMITING THAT IS NOT CONTROLLED WITH YOUR NAUSEA MEDICATION *UNUSUAL SHORTNESS OF BREATH *UNUSUAL BRUISING OR BLEEDING *URINARY PROBLEMS (pain or burning when urinating, or frequent urination) *BOWEL PROBLEMS (unusual diarrhea, constipation, pain near the anus) TENDERNESS IN MOUTH AND THROAT WITH OR WITHOUT PRESENCE OF ULCERS (sore throat, sores in mouth, or a toothache) UNUSUAL RASH, SWELLING OR PAIN  UNUSUAL VAGINAL DISCHARGE OR ITCHING   Items with * indicate a potential emergency and should be followed up as soon as possible or go to the Emergency Department if any problems should occur.  Please show the CHEMOTHERAPY ALERT CARD or IMMUNOTHERAPY ALERT  CARD at check-in to the Emergency Department and triage nurse.  Should you have questions after your visit or need to cancel or reschedule your appointment, please contact [facility]  Dept: [phone]  and follow the prompts.  Office hours are 8:00 a.m. to 4:30 p.m. Monday - Friday. Please note that voicemails left after 4:00 p.m. may not be returned until the following business day.  We are closed weekends and major holidays. You have access to a nurse at all times for urgent questions. Please call the main number to the clinic Dept: [phone] and follow the prompts.   For any non-urgent questions, you may also contact your provider using MyChart. We now offer e-Visits for anyone 1 and older to request care online for non-urgent symptoms. For details visit [email or web address].   Also download the MyChart app! Go to the app store, search "MyChart", open the app, select [facility], and log in with your MyChart username and password.  Due to Covid, a mask is required upon entering the hospital/clinic. If you do not have a mask, one will be given to you upon arrival. For doctor visits, patients may have 1 support person aged 97 or older with them. For treatment visits, patients cannot have anyone with them due to current Covid guidelines and our immunocompromised population.   Vitamin B12 Injection What is this medication? Vitamin B12 (VAHY tuh min B12) prevents and treats low vitamin B12 levels in your body. It is used in people who do not get enough vitamin B12 from their diet or when their digestive tract does not absorb enough. Vitamin B12 plays  an important role in maintaining the health of your nervous system and red blood cells. This medicine may be used for other purposes; ask your health care provider or pharmacist if you have questions. COMMON BRAND NAME(S): B-12 Compliance Kit, B-12 Injection Kit, Cyomin, Dodex, LA-12, Nutri-Twelve, Physicians EZ Use  B-12, Primabalt What should I tell my care team before I take this medication? They need to know if you have any of these conditions: Kidney disease Leber's disease Megaloblastic anemia An unusual or allergic reaction to cyanocobalamin, cobalt, other medications, foods, dyes, or preservatives Pregnant or trying to get pregnant Breast-feeding How should I use this medication? This medication is injected into a muscle or deeply under the skin. It is usually given in a clinic or care team's office. However, your care team may teach you how to inject yourself. Follow all instructions. Talk to your care team about the use of this medication in children. Special care may be needed. Overdosage: If you think you have taken too much of this medicine contact a poison control center or emergency room at once. NOTE: This medicine is only for you. Do not share this medicine with others. What if I miss a dose? If you are given your dose at a clinic or care team's office, call to reschedule your appointment. If you give your own injections, and you miss a dose, take it as soon as you can. If it is almost time for your next dose, take only that dose. Do not take double or extra doses. What may interact with this medication? Colchicine Heavy alcohol intake This list may not describe all possible interactions. Give your health care provider a list of all the medicines, herbs, non-prescription drugs, or dietary supplements you use. Also tell them if you smoke, drink alcohol, or use illegal drugs. Some items may interact with your medicine. What should I watch for while using this medication? Visit your care team regularly. You may need blood work done while you are taking this medication. You may need to follow a special diet. Talk to your care team. Limit your alcohol intake and avoid smoking to get the best benefit. What side effects may I notice from receiving this medication? Side effects that you should  report to your care team as soon as possible: Allergic reactions-skin rash, itching, hives, swelling of the face, lips, tongue, or throat Swelling of the ankles, hands, or feet Trouble breathing Side effects that usually do not require medical attention (report to your care team if they continue or are bothersome): Diarrhea This list may not describe all possible side effects. Call your doctor for medical advice about side effects. You may report side effects to FDA at 1-800-FDA-1088. Where should I keep my medication? Keep out of the reach of children. Store at room temperature between 15 and 30 degrees C (59 and 85 degrees F). Protect from light. Throw away any unused medication after the expiration date. NOTE: This sheet is a summary. It may not cover all possible information. If you have questions about this medicine, talk to your doctor, pharmacist, or health care provider.  2022 Elsevier/Gold Standard (2020-07-14 11:47:06)

## 2021-03-10 NOTE — Progress Notes (Signed)
Nutrition Follow-up:  Patient receiving systemic chemotherapy for metastatic lung cancer. Noted recent ICU admission 9/24-9/26 for pneumonia.   Met with patient and wife during infusion. Patient reports appetite has improved and eating better. He denies taking appetite stimulant. Patient reports he drinks Ensure, but has not had one recently. Wife reports he is eating more at meal times and snacking in the evening, recalls mashed potatoes/gravy, chips, sandwiches, whole milk, chocolate ice cream). Wife reports increasing intake of fruit (mandarin oranges, orange juice) to boost immune system. She has tried making smoothies, but he does not care for these. Patient denies constipation, diarrhea, nausea, vomiting.    Medications: Megace, Compazine, Oxycodone, Miralax  Labs: Na 133, Glucose 102  Anthropometrics: Weight 133 lb 4.8 oz today increased from 132 lb on 9/27 and 128 lb 14.4 oz on 9/8   NUTRITION DIAGNOSIS: Severe malnutrition ongoing   INTERVENTION:  Continue strategies for increasing calories and protein Encouraged bedtime snack for added calories Encouraged drinking 2 Ensure Plus/equivalent daily, coupons provided Patient politely declined Ensure offered during infusion    MONITORING, EVALUATION, GOAL: weight trends, intake   NEXT VISIT: Wednesday October 26 during infusion

## 2021-03-10 NOTE — Progress Notes (Signed)
[facility] [phone]   [phone]  OFFICE PROGRESS NOTE  Koirala, Dibas, MD [address] [place] [address]  DIAGNOSIS:  Metastatic non-small cell lung cancer initially diagnosed as stage IIIA/B (T3, N1/N2, M0) non-small cell lung cancer, adenocarcinoma.  He presented with a right upper lobe lung mass with direct extension across the major fissure into the superior segment of the right lower lobe and ipsilateral hilar lymphadenopathy.  There is questionable subcarinal lymphadenopathy versus esophageal hypermetabolism this was evaluated by gastroenterology with upper endoscopy on 01/30/2020 and there was no mass lesion or malignancy in that area. The patient was diagnosed in May 2021.    Biomarker Findings Microsatellite status - MS-Stable Tumor Mutational Burden - 9 Muts/Mb Genomic Findings For a complete list of the genes assayed, please refer to the Appendix. KRAS G12C CDKN2A/B p16INK4a S12* NKX2-1 amplification - equivocal? PMS2 R3Q TP53 P11fs*38 7 Disease relevant genes with no reportable alterations: ALK, BRAF, EGFR, ERBB2, MET, RET, ROS1   PDL1: 70%  REPEAT MOLECULAR STUDIES BY CARIS ON THE LIVER BIOPSY:  Results with Therapy Associations BIOMARKER METHOD ANALYTE RESULT THERAPY ASSOCIATION BIOMARKER LEVEL* . alectinib, ceritinib, crizotinib, lorlatinib Level 1 . IHC Protein Negative  0 brigatinib Level 2 . ALK Seq RNA-Tumor Fusion Not Detected LACK OF BENEFIT alectinib, brigatinib, ceritinib, crizotinib, lorlatinib Level 2 .BRAF Seq DNA-Tumor Mutation Not Detected LACK OF BENEFIT dabrafenib and trametinib combination therapy, vemurafenib Level 2 .EGFR Seq DNA-Tumor Mutation Not Detected LACK OF BENEFIT erlotinib, gefitinib Level 2 KRAS Seq DNA-Tumor Mutation Not Detected LACK OF BENEFIT sotorasib Level 2 .RET Seq RNA-Tumor Fusion Not Detected LACK OF BENEFIT pralsetinib, selpercatinib Level  2 .ROS1 Seq RNA-Tumor Fusion Not Detected LACK OF BENEFIT ceritinib, crizotinib, entrectinib, lorlatinib Level 2 . CNA-Seq DNA-Tumor Amplification Not Detected .MET Seq RNA-Tumor Variant Transcript Not Detected LACK OFBENEFIT crizotinib Level 3  PRIOR THERAPY:  1) Weekly concurrent chemoradiation with carboplatin for an AUC of 2 and paclitaxel 45 mg/m.  First dose expected on 11/26/2019. Status post 7 cycles.   Last dose was given 01/07/2020 with partial response. 2) Consolidation treatment with immunotherapy with Imfinzi 1500 mg IV every 4 weeks.  First dose 02/19/2020. Status post 6 cycles.  Discontinued secondary to disease progression. 3) Lumakras (Sotorasib) 960 mg p.o. daily.  First dose started December 12, 2020.  Status post 1 months of treatment discontinued secondary to disease progression in the liver    CURRENT THERAPY: Systemic chemotherapy with carboplatin for AUC of 5, Alimta 500 Mg/M2 and Keytruda 200 Mg IV every 3 weeks.  First dose 01/22/2021.  Status post 2 cycles.  INTERVAL HISTORY: Christopher Harding 70 y.o. male returns to the clinic today for follow-up visit accompanied by his wife.  The patient is feeling fine today with no concerning complaints except for mild fatigue.  He was admitted to the hospital 10 days ago with significant dyspnea as well as agitation became combative during his ED evaluation.  The patient was found to have oxygen saturation in the 70s.  He was treated for pneumonia and felt much better.  He denied having any current chest pain, shortness of breath, cough or hemoptysis.  He denied having any fever or chills.  He has no nausea, vomiting, diarrhea or constipation.  He has no headache or visual changes.  He is here today for evaluation before starting cycle #3.  His wife noticed significant improvement in his general wellbeing after starting the chemotherapy.  MEDICAL HISTORY: Past Medical History:  Diagnosis Date   A-fib [facility])    s/p DCCV 12/07/18   Cancer  [facility])    Depression    situational   Dysrhythmia    Hypertension    Persistent atrial fibrillation ([place]) 11/23/2018   Persistent atrial fibrillation   Tobacco abuse 11/23/2018    ALLERGIES:  has No Known Allergies.  MEDICATIONS:  Current Outpatient Medications  Medication Sig Dispense Refill   folic acid (FOLVITE) 1 MG tablet Take 1 tablet (1 mg total) by mouth daily. 30 tablet 4   megestrol (MEGACE ES) 625 MG/5ML suspension Take 5 mLs (625 mg total) by mouth daily. (Patient not taking: Reported on 03/01/2021) 150 mL 0   metoprolol tartrate (LOPRESSOR) 50 MG tablet Take 50 mg by mouth 2 (two) times daily with a meal.     mirtazapine (REMERON) 30 MG tablet TAKE 1 TABLET BY MOUTH AT BEDTIME. 90 tablet 1   morphine (MS CONTIN) 30 MG 12 hr tablet Take 1 tablet (30 mg total) by mouth every 12 (twelve) hours. 60 tablet 0   oxyCODONE (OXY IR/ROXICODONE) 5 MG immediate release tablet Take 1 tablet (5 mg total) by mouth 3 (three) times daily as needed for severe pain. (Patient not taking: Reported on 03/01/2021) 30 tablet 0   polyethylene glycol (MIRALAX / GLYCOLAX) 17 g packet Take 17 g by mouth daily.     prochlorperazine (COMPAZINE) 10 MG tablet Take 1 tablet (10 mg total) by mouth every 6 (six) hours as needed for nausea or vomiting. 30 tablet 0   tamsulosin (FLOMAX) 0.4 MG CAPS capsule Take 1 capsule (0.4 mg total) by mouth daily for 7 days. 7 capsule 0   XARELTO 20 MG TABS tablet Take 20 mg by mouth daily.     No current facility-administered medications for this visit.    SURGICAL HISTORY:  Past Surgical History:  Procedure Laterality Date   CARDIOVERSION N/A 12/07/2018   Procedure: CARDIOVERSION;  Surgeon: Josue Hector, MD;  Location: [place];  Service: Cardiovascular;  Laterality: N/A;   FINE NEEDLE ASPIRATION  10/30/2019   Procedure: FINE NEEDLE ASPIRATION (FNA) LINEAR;  Surgeon: Candee Furbish, MD;  Location: [facility] ENDOSCOPY;  Service: Pulmonary;;   INGUINAL HERNIA REPAIR Right  01/25/2019   Procedure: RIGHT INGUINAL HERNIA REPAIR WITH MESH;  Surgeon: Coralie Keens, MD;  Location: [place];  Service: General;  Laterality: Right;  TAP BLOCK   INSERTION OF MESH Right 01/25/2019   Procedure: Insertion Of Mesh;  Surgeon: Coralie Keens, MD;  Location: [place];  Service: General;  Laterality: Right;   VIDEO BRONCHOSCOPY WITH ENDOBRONCHIAL ULTRASOUND N/A 10/30/2019   Procedure: VIDEO BRONCHOSCOPY WITH ENDOBRONCHIAL ULTRASOUND;  Surgeon: Candee Furbish, MD;  Location: [facility] ENDOSCOPY;  Service: Pulmonary;  Laterality: N/A;    REVIEW OF SYSTEMS:  A comprehensive review of systems was negative except for: Constitutional: positive for fatigue Respiratory: positive for dyspnea on exertion   PHYSICAL EXAMINATION: General appearance: alert, cooperative, fatigued, and no distress Head: Normocephalic, without obvious abnormality, atraumatic Neck: no adenopathy, no JVD, supple, symmetrical, trachea midline, and thyroid not enlarged, symmetric, no tenderness/mass/nodules Lymph nodes: Cervical, supraclavicular, and axillary nodes normal. Resp: clear to auscultation bilaterally Back: symmetric, no curvature. ROM normal. No CVA tenderness. Cardio: regular rate and rhythm, S1, S2 normal, no murmur, click, rub or gallop GI: soft, non-tender; bowel sounds normal; no masses,  no organomegaly Extremities: extremities normal, atraumatic, no cyanosis or edema  ECOG PERFORMANCE STATUS: 1 - Symptomatic but  completely ambulatory  Blood pressure 111/78, pulse 65, temperature 97.7 F (36.5 C), temperature source Tympanic, resp. rate 18, height $RemoveBe'6\' 2"'hjKrIDwak$  (1.88 m), weight 133 lb 4.8 oz (60.5 kg).  LABORATORY DATA: Lab Results  Component Value Date   WBC 9.3 03/10/2021   HGB 9.2 (L) 03/10/2021   HCT 26.9 (L) 03/10/2021   MCV 92.1 03/10/2021   PLT 266 03/10/2021      Chemistry      Component Value Date/Time   NA 132 (L) 03/03/2021 0423   NA 129 (L) 11/24/2018 1520   K 3.4 (L) 03/03/2021 0423    CL 104 03/03/2021 0423   CO2 21 (L) 03/03/2021 0423   BUN 8 03/03/2021 0423   BUN 8 11/24/2018 1520   CREATININE 0.61 03/03/2021 0423   CREATININE 0.64 02/19/2021 1138      Component Value Date/Time   CALCIUM 8.3 (L) 03/03/2021 0423   ALKPHOS 107 02/28/2021 1356   AST 21 02/28/2021 1356   AST 16 02/19/2021 1138   ALT 10 02/28/2021 1356   ALT 7 02/19/2021 1138   BILITOT 1.2 02/28/2021 1356   BILITOT 1.1 02/19/2021 1138       RADIOGRAPHIC STUDIES: CT HEAD W & WO CONTRAST (5MM)  Result Date: 02/28/2021 CLINICAL DATA:  History of lung cancer, altered EXAM: CT HEAD WITHOUT AND WITH CONTRAST TECHNIQUE: Contiguous axial images were obtained from the base of the skull through the vertex without and with intravenous contrast CONTRAST:  17mL OMNIPAQUE IOHEXOL 350 MG/ML SOLN COMPARISON:  MRI 12/09/2020 FINDINGS: Brain: No acute territorial infarction, hemorrhage, or intracranial mass is visualized. Mild atrophy and chronic small vessel ischemic changes of the white matter. Stable ventricle size. No abnormal focal contrast enhancement within the brain parenchyma. Vascular: No hyperdense vessels. 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 Skull: Normal. Negative for fracture or focal lesion. Sinuses/Orbits: No acute finding. Other: None IMPRESSION: 1. No CT evidence for acute intracranial abnormality. Atrophy and chronic small-vessel ischemic changes of the white matter. Negative for enhancing mass lesion. 2. 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 Electronically Signed   By: Donavan Foil M.D.   On: 02/28/2021 21:59   CT Angio Chest PE W/Cm &/Or Wo Cm  Result Date: 02/28/2021 CLINICAL DATA:  History of lung cancer hypoxia altered EXAM: CT ANGIOGRAPHY CHEST WITH CONTRAST TECHNIQUE: Multidetector CT imaging of the chest was performed using the standard protocol during bolus administration of intravenous contrast. Multiplanar CT image reconstructions and MIPs were obtained to evaluate the  vascular anatomy. CONTRAST:  66mL OMNIPAQUE IOHEXOL 350 MG/ML SOLN COMPARISON:  Chest x-ray 02/28/2021, CT chest 01/12/2021, 11/06/2020, 08/08/2020, PET CT 11/12/2018 FINDINGS: Cardiovascular: Satisfactory opacification of the pulmonary arteries to the segmental level. No evidence of pulmonary embolism. Moderate aortic atherosclerosis. No aneurysm. Coronary vascular calcification. Normal cardiac size. No pericardial effusion Mediastinum/Nodes: Midline trachea. No thyroid. No increasing adenopathy. Esophagus within normal limits. Lungs/Pleura: Small right-sided pleural effusion with loculation at the apex is stable to minimally increased in size. Bandlike consolidation in the right upper lobe and superior segment of right lower lobe grossly similar in appearance. 4 mm left upper lobe lung nodule, series 4, image 58, previously 4 mm. Stable densely calcified nodule in the right lung base. Stable 3 mm left posterior subpleural lung nodule series 4, image 18. Stable 3 mm right subpleural lower lobe lung nodule, series 4, image 115. Upper Abdomen: Incompletely visualized large hepatic metastatic lesion in the left hepatic lobe. Musculoskeletal: No  acute or suspicious osseous abnormality Review of the MIP images confirms the above findings. IMPRESSION: 1. No acute pulmonary embolus is seen. 2. Similar consolidation and bandlike density in the right upper lobe and superior segment of right lower lobe. Small right-sided pleural effusion with loculation superiorly, this is stable to minimally increased in size. Other punctate bilateral pulmonary nodules are unchanged. 3. Incompletely visualized large metastatic lesion in the left hepatic lobe Aortic Atherosclerosis (ICD10-I70.0). Electronically Signed   By: Donavan Foil M.D.   On: 02/28/2021 21:51   DG Chest Port 1 View  Result Date: 02/28/2021 CLINICAL DATA:  Questionable sepsis - evaluate for abnormality history lung cancer EXAM: PORTABLE CHEST 1 VIEW COMPARISON:  Oct 30, 2019, November 06, 2020 FINDINGS: The cardiomediastinal silhouette is unchanged in contour.Revisualization an irregular contour of the RIGHT upper lung along the mediastinal border, similar in comparison to prior. Asymmetric RIGHT apical pleural thickening, unchanged nodular opacity overlying the RIGHT costophrenic angle, unchanged and consistent with a calcified granuloma. No pleural effusion. No pneumothorax. No acute pleuroparenchymal abnormality. Visualized abdomen is unremarkable. IMPRESSION: No new focal consolidation. Electronically Signed   By: Valentino Saxon M.D.   On: 02/28/2021 14:09     ASSESSMENT AND PLAN: This is a very pleasant 70 years old white male with metastatic non-small cell lung cancer initially diagnosed as stage IIIA/B (T3, N1/N2, M0) non-small cell lung cancer, adenocarcinoma presented with right upper lobe lung mass with direct extension across the major fissure into the superior segment of the right lower lobe and ipsilateral hilar lymphadenopathy as well as questionable subcarinal lymphadenopathy.  The patient had evidence for disease metastasis confirmed in June 2022. The patient completed a course of concurrent chemoradiation with weekly carboplatin and paclitaxel status post 7 cycles.  He tolerated this treatment well with no concerning adverse effect except for mild sore throat and mouth. The patient had partial response to this treatment. The patient is currently undergoing a course of consolidation treatment with immunotherapy with Imfinzi 1500 mg IV every 4 weeks status post 6 cycles.  He has been tolerating his treatment well with no concerning adverse effects. His last CT scan of the chest several months ago showed no concerning findings for disease progression in the chest but the patient has a highly suspicious liver lesion concerning for metastatic disease.  The patient underwent ultrasound-guided core biopsy of this lesion. The final pathology read at [facility] in [facility] to [facility] showed no evidence of malignancy and there was suspicious inflammatory process of biliary origin. He has been on observation and repeat CT scan of the chest, abdomen pelvis performed few weeks ago showed further increase in the size of the suspicious liver metastasis.  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 He has no interest in systemic chemotherapy.  The patient is started treatment with Lumakras (Sotorasib) 960 mg p.o. daily 4 days ago and has been tolerating this treatment well. He continues to have increasing fatigue and weakness as well as lack of appetite and weight loss.  He also has significant pain in the epigastric area. His molecular studies from the liver biopsy showed no actionable mutations and it is unclear the discrepancy between the previous lung biopsy molecular study as well as the liver biopsy molecular studies except that the new lesion in the liver is consistent  with poorly differentiated squamous cell carcinoma compared to the lung biopsy that was adenocarcinoma at that time. The patient started treatment with Lumakras (Sotorasib) 960 mg p.o. daily status post 1 months.  He tolerated the treatment well except for fatigue and occasional diarrhea. He had repeat CT scan of the chest, abdomen pelvis performed recently. I personally and independently reviewed the scan images and discussed the result and showed the images to the patient and his wife. Unfortunately his scan showed further disease progression in the liver with enlargement of the dominant mass and development of new lesions in the liver.  His treatment with Lumakras (Sotorasib) was discontinued secondary to disease progression. The patient is currently undergoing palliative  systemic chemotherapy with carboplatin for AUC of 5, Alimta 500 Mg/M2 and Keytruda 200 Mg IV every 3 weeks.  Status post 2 cycles. He has been tolerating his systemic chemotherapy fairly well with no significant adverse effects. I recommended for the patient to proceed with cycle #3 today as planned. I will see him back for follow-up visit in 3 weeks for evaluation with repeat CT scan of the abdomen pelvis for restaging of his disease.  He had a recent CT scan of the chest that was unremarkable for any disease progression in the chest. For the lack of appetite and weight loss, he will continue on Megace ES 625 mg p.o. daily. For the dysphagia, he is followed by gastroenterology and receives dilatation of the esophagus as needed.. For the constipation, the patient will continue on stool softener and MiraLAX on as-needed basis. The patient was advised to call immediately if he has any concerning symptoms in the interval. The patient voices understanding of current disease status and treatment options and is in agreement with the current care plan.  All questions were answered. The patient knows to call the clinic with any problems, questions or concerns. We can certainly see the patient much sooner if necessary.  Disclaimer: This note was dictated with voice recognition software. Similar sounding words can inadvertently be transcribed and may not be corrected upon review.

## 2021-03-11 ENCOUNTER — Telehealth: Payer: Self-pay | Admitting: Internal Medicine

## 2021-03-11 NOTE — Telephone Encounter (Signed)
Left message with follow-up appointments per 10/4 los.

## 2021-03-12 ENCOUNTER — Other Ambulatory Visit: Payer: Self-pay | Admitting: Internal Medicine

## 2021-03-12 ENCOUNTER — Other Ambulatory Visit: Payer: Medicare Other

## 2021-03-13 ENCOUNTER — Other Ambulatory Visit: Payer: Self-pay | Admitting: Physician Assistant

## 2021-03-13 ENCOUNTER — Telehealth: Payer: Self-pay

## 2021-03-13 DIAGNOSIS — G893 Neoplasm related pain (acute) (chronic): Secondary | ICD-10-CM

## 2021-03-13 MED ORDER — MORPHINE SULFATE ER 30 MG PO TBCR: 30.0000 mg | EXTENDED_RELEASE_TABLET | Freq: Two times a day (BID) | ORAL | 0 refills | Status: DC

## 2021-03-13 MED ORDER — MORPHINE SULFATE ER 30 MG PO TBCR
30.0000 mg | EXTENDED_RELEASE_TABLET | Freq: Two times a day (BID) | ORAL | 0 refills | Status: DC
Start: 2021-03-13 — End: 2021-03-13

## 2021-03-13 NOTE — Telephone Encounter (Signed)
Returned call to patient's wife, Cinda Quest, who is on the patient's DPR. Wife requested refill for patient's morphine, which was filled by [facility], PA-C. Wife also noted concerns with patient's breathing, stating that he was experiencing "mild" shortness of breath this morning and wanted to know if Dr. Julien Nordmann had any recommendations. Malinda provided with Dr. Worthy Flank recommendations and encouraged to seek emergent help should the patient experience any acute respiratory distress. Wife agreeable to these recommendations. Wife states that patient's oxygen saturations are stable in the high 90's at this time.  Spoke to wife at length regarding her concerns for her husband. Wife noted that she has reached out to [facility] for palliative care assistance. Dr. Julien Nordmann agreeable to this and confirmed with [facility]'s staff member, Marzetta Board, over the phone. Patient will be receiving a call to set up a visit within the next few business days.  Provided additional support to patient's wife and reminded them to reach out to our after hours triage line should they have any additional questions or concerns over the weekend. All questions and concerns addressed at this time. Wife appreciative of call.

## 2021-03-16 ENCOUNTER — Telehealth: Payer: Self-pay

## 2021-03-16 NOTE — Telephone Encounter (Signed)
(  4:48 pm) SW scheduled initial palliative care visit with patient's wife. RN/SW scheduled to visit with patient on 10/13 @ 1pm.

## 2021-03-19 ENCOUNTER — Encounter: Payer: Self-pay | Admitting: Internal Medicine

## 2021-03-19 ENCOUNTER — Inpatient Hospital Stay: Payer: Medicare Other

## 2021-03-19 ENCOUNTER — Other Ambulatory Visit: Payer: Medicare Other | Admitting: *Deleted

## 2021-03-19 ENCOUNTER — Other Ambulatory Visit: Payer: Self-pay

## 2021-03-19 ENCOUNTER — Other Ambulatory Visit: Payer: Medicare Other

## 2021-03-19 DIAGNOSIS — Z5111 Encounter for antineoplastic chemotherapy: Secondary | ICD-10-CM | POA: Diagnosis not present

## 2021-03-19 DIAGNOSIS — C3411 Malignant neoplasm of upper lobe, right bronchus or lung: Secondary | ICD-10-CM | POA: Diagnosis not present

## 2021-03-19 DIAGNOSIS — F32A Depression, unspecified: Secondary | ICD-10-CM | POA: Diagnosis not present

## 2021-03-19 DIAGNOSIS — C3491 Malignant neoplasm of unspecified part of right bronchus or lung: Secondary | ICD-10-CM

## 2021-03-19 DIAGNOSIS — Z515 Encounter for palliative care: Secondary | ICD-10-CM

## 2021-03-19 DIAGNOSIS — Z79899 Other long term (current) drug therapy: Secondary | ICD-10-CM | POA: Diagnosis not present

## 2021-03-19 DIAGNOSIS — G4700 Insomnia, unspecified: Secondary | ICD-10-CM | POA: Diagnosis not present

## 2021-03-19 DIAGNOSIS — K769 Liver disease, unspecified: Secondary | ICD-10-CM | POA: Diagnosis not present

## 2021-03-19 DIAGNOSIS — Z7901 Long term (current) use of anticoagulants: Secondary | ICD-10-CM | POA: Diagnosis not present

## 2021-03-19 DIAGNOSIS — I4891 Unspecified atrial fibrillation: Secondary | ICD-10-CM | POA: Diagnosis not present

## 2021-03-19 LAB — CBC WITH DIFFERENTIAL (CANCER CENTER ONLY)
Abs Immature Granulocytes: 0.05 10*3/uL (ref 0.00–0.07)
Basophils Absolute: 0 10*3/uL (ref 0.0–0.1)
Basophils Relative: 1 %
Eosinophils Absolute: 0 10*3/uL (ref 0.0–0.5)
Eosinophils Relative: 0 %
HCT: 22.4 % — ABNORMAL LOW (ref 39.0–52.0)
Hemoglobin: 7.8 g/dL — ABNORMAL LOW (ref 13.0–17.0)
Immature Granulocytes: 1 %
Lymphocytes Relative: 8 %
Lymphs Abs: 0.5 10*3/uL — ABNORMAL LOW (ref 0.7–4.0)
MCH: 32.4 pg (ref 26.0–34.0)
MCHC: 34.8 g/dL (ref 30.0–36.0)
MCV: 92.9 fL (ref 80.0–100.0)
Monocytes Absolute: 0.5 10*3/uL (ref 0.1–1.0)
Monocytes Relative: 7 %
Neutro Abs: 5.5 10*3/uL (ref 1.7–7.7)
Neutrophils Relative %: 83 %
Platelet Count: 130 10*3/uL — ABNORMAL LOW (ref 150–400)
RBC: 2.41 MIL/uL — ABNORMAL LOW (ref 4.22–5.81)
RDW: 16.7 % — ABNORMAL HIGH (ref 11.5–15.5)
WBC Count: 6.5 10*3/uL (ref 4.0–10.5)
nRBC: 0 % (ref 0.0–0.2)

## 2021-03-19 LAB — CMP (CANCER CENTER ONLY)
ALT: 8 U/L (ref 0–44)
AST: 18 U/L (ref 15–41)
Albumin: 3.1 g/dL — ABNORMAL LOW (ref 3.5–5.0)
Alkaline Phosphatase: 89 U/L (ref 38–126)
Anion gap: 9 (ref 5–15)
BUN: 12 mg/dL (ref 8–23)
CO2: 24 mmol/L (ref 22–32)
Calcium: 8.7 mg/dL — ABNORMAL LOW (ref 8.9–10.3)
Chloride: 101 mmol/L (ref 98–111)
Creatinine: 0.5 mg/dL — ABNORMAL LOW (ref 0.61–1.24)
GFR, Estimated: >60 mL/min (ref >60)
Glucose, Bld: 113 mg/dL — ABNORMAL HIGH (ref 70–99)
Potassium: 3.7 mmol/L (ref 3.5–5.1)
Sodium: 134 mmol/L — ABNORMAL LOW (ref 135–145)
Total Bilirubin: 0.7 mg/dL (ref 0.3–1.2)
Total Protein: 6.4 g/dL — ABNORMAL LOW (ref 6.5–8.1)

## 2021-03-20 ENCOUNTER — Other Ambulatory Visit: Payer: Medicare Other

## 2021-03-20 ENCOUNTER — Other Ambulatory Visit: Payer: Self-pay | Admitting: Medical Oncology

## 2021-03-20 ENCOUNTER — Telehealth: Payer: Self-pay | Admitting: Internal Medicine

## 2021-03-20 ENCOUNTER — Telehealth: Payer: Self-pay | Admitting: Medical Oncology

## 2021-03-20 DIAGNOSIS — D649 Anemia, unspecified: Secondary | ICD-10-CM

## 2021-03-20 NOTE — Telephone Encounter (Signed)
Pt declined blood transfusion today or tomorrow. I instructed wife if his symptoms get worse he needs to go to ED

## 2021-03-20 NOTE — Telephone Encounter (Signed)
Scheduled appointment per patient requested times, Patient is aware.

## 2021-03-20 NOTE — Telephone Encounter (Signed)
Per Dr. Julien Nordmann.,Wife notified that pt may take MScontin 30 mg every 8 hours.

## 2021-03-20 NOTE — Telephone Encounter (Signed)
Symptoms  5 days after chemo feeling anxious,not eating, nausea, Pain ,general fatigue, He wanted to give up.  Anxiety-Xanax 025 mg helps .( Dr. Dorthy Cooler).  Nausea/ request to refill Compazine .  Pain not controlled..-MS contin 30 bid does not control his pain.He is not comfortable or able to rest.  ?Wife asking if his [place] can be increased to 60 mg bid?  Oxycodone 5 mg did not help for breakthrough pain.  Fatigue- He will be scheduled for blood this weekend.  [facility] met with pt and wife yesterday.

## 2021-03-20 NOTE — Progress Notes (Signed)
Blood orders entered. 

## 2021-03-20 NOTE — Progress Notes (Signed)
COMMUNITY PALLIATIVE CARE SW NOTE  PATIENT NAME: Christopher Harding DOB: January 08, 1951 MRN: [id number]  PRIMARY CARE PROVIDER: Lujean Amel, MD  RESPONSIBLE PARTY:  Acct ID - Guarantor Home Phone Work Phone Relationship Acct Type  [id number] Christopher Harding, [phone]  Self P/F     [address], [place], [place] [address]     PLAN OF CARE and INTERVENTIONS:             GOALS OF CARE/ ADVANCE CARE PLANNING:  Goal is for patient to remain in his home and avoid hospitalizations. Patient is DNR and have MOST form to indicate that.  SOCIAL/EMOTIONAL/SPIRITUAL ASSESSMENT/ INTERVENTIONS:  SW and RN-M. Howard completed initial visit with patient at his home. He was present with his wife-Melinda. Patient and his wife was provide education regarding palliative care services and patient provided verbal consent. Patient has a Liver Cancer diagnosis. He is in remission from Lung cancer. Patient and his wife provided a status update on patient. Patient reported that he had last chemotherapy a week ago. He uses no medical devices to ambulate or complete ADL's.Patient does use a wheelchair when he goes for treatment. Patient manages his own medications. He has had some weight gain in the past 5/6 months. Patient naps during the day, but does wake up at 2 pm, hungry, agitated and irritable. His appetite is good and his diet consist of soft foods.Patient wants to avoid any further hospitalizations. He desires to continue with chemotherapy, despite it being harsh on his body for the first few days following treatment. SOCIAL HISTORY: Patient was born and raised in [place]. He graduated from [employer] . He has been married to his wife for 43 years and they have two children.  Patient has a MOST form that indicating that patient is a DNR. Patient and his wife are open to ongoing palliative care support.  PATIENT/CAREGIVER EDUCATION/ COPING:  Patient appeared to be in a good mood, he was open and engaged  with the team. He was humorous and seems to be realistic about his condition and prognosis.  PERSONAL EMERGENCY PLAN:  911 can be accessed for emergencies. COMMUNITY RESOURCES COORDINATION/ HEALTH CARE NAVIGATION:  None. FINANCIAL/LEGAL CONCERNS/INTERVENTIONS: None.     SOCIAL HX:  Social History   Tobacco Use   Smoking status: Former    Packs/day: 0.50    Years: 45.00    Pack years: 22.50    Types: Cigarettes   Smokeless tobacco: Never  Substance Use Topics   Alcohol use: Yes    Alcohol/week: 50.0 standard drinks    Types: 50 Cans of beer per week    CODE STATUS: DNR as indicated on  ADVANCED DIRECTIVES: No MOST FORM COMPLETE: Yes HOSPICE EDUCATION PROVIDED: No  PPS: Patient is alert and oriented x3. He is independent for all ADL's.  Duration of visit and documentation:60 minutes  Katheren Puller, LCSW

## 2021-03-20 NOTE — Progress Notes (Addendum)
Blood transfusion ordered for 10/17.

## 2021-03-23 ENCOUNTER — Other Ambulatory Visit: Payer: Self-pay

## 2021-03-23 ENCOUNTER — Inpatient Hospital Stay: Payer: Medicare Other

## 2021-03-23 ENCOUNTER — Other Ambulatory Visit: Payer: Self-pay | Admitting: Medical Oncology

## 2021-03-23 DIAGNOSIS — G4700 Insomnia, unspecified: Secondary | ICD-10-CM | POA: Diagnosis not present

## 2021-03-23 DIAGNOSIS — R112 Nausea with vomiting, unspecified: Secondary | ICD-10-CM

## 2021-03-23 DIAGNOSIS — D649 Anemia, unspecified: Secondary | ICD-10-CM

## 2021-03-23 DIAGNOSIS — I4891 Unspecified atrial fibrillation: Secondary | ICD-10-CM | POA: Diagnosis not present

## 2021-03-23 DIAGNOSIS — Z5111 Encounter for antineoplastic chemotherapy: Secondary | ICD-10-CM | POA: Diagnosis not present

## 2021-03-23 DIAGNOSIS — K769 Liver disease, unspecified: Secondary | ICD-10-CM | POA: Diagnosis not present

## 2021-03-23 DIAGNOSIS — Z7901 Long term (current) use of anticoagulants: Secondary | ICD-10-CM | POA: Diagnosis not present

## 2021-03-23 DIAGNOSIS — C3411 Malignant neoplasm of upper lobe, right bronchus or lung: Secondary | ICD-10-CM | POA: Diagnosis not present

## 2021-03-23 DIAGNOSIS — C3491 Malignant neoplasm of unspecified part of right bronchus or lung: Secondary | ICD-10-CM

## 2021-03-23 DIAGNOSIS — F32A Depression, unspecified: Secondary | ICD-10-CM | POA: Diagnosis not present

## 2021-03-23 DIAGNOSIS — Z5189 Encounter for other specified aftercare: Secondary | ICD-10-CM

## 2021-03-23 DIAGNOSIS — Z79899 Other long term (current) drug therapy: Secondary | ICD-10-CM | POA: Diagnosis not present

## 2021-03-23 LAB — CBC WITH DIFFERENTIAL (CANCER CENTER ONLY)
Abs Immature Granulocytes: 0.01 10*3/uL (ref 0.00–0.07)
Basophils Absolute: 0 10*3/uL (ref 0.0–0.1)
Basophils Relative: 0 %
Eosinophils Absolute: 0 10*3/uL (ref 0.0–0.5)
Eosinophils Relative: 0 %
HCT: 20.3 % — ABNORMAL LOW (ref 39.0–52.0)
Hemoglobin: 7 g/dL — ABNORMAL LOW (ref 13.0–17.0)
Immature Granulocytes: 0 %
Lymphocytes Relative: 19 %
Lymphs Abs: 0.5 10*3/uL — ABNORMAL LOW (ref 0.7–4.0)
MCH: 32.1 pg (ref 26.0–34.0)
MCHC: 34.5 g/dL (ref 30.0–36.0)
MCV: 93.1 fL (ref 80.0–100.0)
Monocytes Absolute: 0.3 10*3/uL (ref 0.1–1.0)
Monocytes Relative: 11 %
Neutro Abs: 1.8 10*3/uL (ref 1.7–7.7)
Neutrophils Relative %: 70 %
Platelet Count: 61 10*3/uL — ABNORMAL LOW (ref 150–400)
RBC: 2.18 MIL/uL — ABNORMAL LOW (ref 4.22–5.81)
RDW: 16.8 % — ABNORMAL HIGH (ref 11.5–15.5)
WBC Count: 2.6 10*3/uL — ABNORMAL LOW (ref 4.0–10.5)
nRBC: 0 % (ref 0.0–0.2)

## 2021-03-23 LAB — PREPARE RBC (CROSSMATCH)

## 2021-03-23 MED ORDER — DIPHENHYDRAMINE HCL 25 MG PO CAPS: 25.0000 mg | ORAL_CAPSULE | Freq: Once | ORAL | Status: DC

## 2021-03-23 MED ORDER — PROCHLORPERAZINE MALEATE 10 MG PO TABS: 10.0000 mg | ORAL_TABLET | Freq: Four times a day (QID) | ORAL | 0 refills | Status: AC | PRN

## 2021-03-23 MED ORDER — ACETAMINOPHEN 160 MG/5ML PO SOLN
650.0000 mg | Freq: Once | ORAL | Status: AC
  Administered 2021-03-23: 650 mg via ORAL
  Filled 2021-03-23: qty 20.3

## 2021-03-23 MED ORDER — FOLIC ACID 1 MG PO TABS
1.0000 mg | ORAL_TABLET | Freq: Every day | ORAL | 4 refills | Status: AC
Start: 2021-03-23 — End: ?

## 2021-03-23 MED ORDER — DIPHENHYDRAMINE HCL 50 MG/ML IJ SOLN
25.0000 mg | Freq: Once | INTRAMUSCULAR | Status: AC
  Administered 2021-03-23: 25 mg via INTRAVENOUS
  Filled 2021-03-23: qty 1

## 2021-03-23 MED ORDER — SODIUM CHLORIDE 0.9% IV SOLUTION
250.0000 mL | Freq: Once | INTRAVENOUS | Status: AC
  Administered 2021-03-23: 250 mL via INTRAVENOUS

## 2021-03-23 MED ORDER — ACETAMINOPHEN 325 MG PO TABS: 650.0000 mg | ORAL_TABLET | Freq: Once | ORAL | Status: DC

## 2021-03-23 NOTE — Patient Instructions (Signed)
Blood Transfusion, Adult, Care After This sheet gives you information about how to care for yourself after your procedure. Your doctor may also give you more specific instructions. If you have problems or questions, contact your doctor. What can I expect after the procedure? After the procedure, it is common to have: Bruising and soreness at the IV site. A headache. Follow these instructions at home: Insertion site care   Follow instructions from your doctor about how to take care of your insertion site. This is where an IV tube was put into your vein. Make sure you: Wash your hands with soap and water before and after you change your bandage (dressing). If you cannot use soap and water, use hand sanitizer. Change your bandage as told by your doctor. Check your insertion site every day for signs of infection. Check for: Redness, swelling, or pain. Bleeding from the site. Warmth. Pus or a bad smell. General instructions Take over-the-counter and prescription medicines only as told by your doctor. Rest as told by your doctor. Go back to your normal activities as told by your doctor. Keep all follow-up visits as told by your doctor. This is important. Contact a doctor if: You have itching or red, swollen areas of skin (hives). You feel worried or nervous (anxious). You feel weak after doing your normal activities. You have redness, swelling, warmth, or pain around the insertion site. You have blood coming from the insertion site, and the blood does not stop with pressure. You have pus or a bad smell coming from the insertion site. Get help right away if: You have signs of a serious reaction. This may be coming from an allergy or the body's defense system (immune system). Signs include: Trouble breathing or shortness of breath. Swelling of the face or feeling warm (flushed). Fever or chills. Head, chest, or back pain. Dark pee (urine) or blood in the pee. Widespread rash. Fast  heartbeat. Feeling dizzy or light-headed. You may receive your blood transfusion in an outpatient setting. If so, you will be told whom to contact to report any reactions. These symptoms may be an emergency. Do not wait to see if the symptoms will go away. Get medical help right away. Call your local emergency services (911 in the [place] Do not drive yourself to the hospital. Summary Bruising and soreness at the IV site are common. Check your insertion site every day for signs of infection. Rest as told by your doctor. Go back to your normal activities as told by your doctor. Get help right away if you have signs of a serious reaction. This information is not intended to replace advice given to you by your health care provider. Make sure you discuss any questions you have with your health care provider. Document Revised: 09/18/2020 Document Reviewed: 11/16/2018 [employer] Patient Education  2022 [employer]  

## 2021-03-24 LAB — TYPE AND SCREEN
ABO/RH(D): A NEG
Antibody Screen: NEGATIVE
Unit division: 0
Unit division: 0

## 2021-03-24 LAB — BPAM RBC
Blood Product Expiration Date: 202211042359
Blood Product Expiration Date: 202211122359
ISSUE DATE / TIME: 202210171345
ISSUE DATE / TIME: 202210171345
Unit Type and Rh: 600
Unit Type and Rh: 600

## 2021-03-25 ENCOUNTER — Inpatient Hospital Stay: Payer: Medicare Other

## 2021-03-25 ENCOUNTER — Ambulatory Visit: Payer: Medicare Other | Admitting: Physician Assistant

## 2021-03-25 ENCOUNTER — Ambulatory Visit: Payer: Medicare Other

## 2021-03-25 ENCOUNTER — Ambulatory Visit (HOSPITAL_COMMUNITY): Admission: RE | Admit: 2021-03-25 | Payer: Medicare Other | Source: Ambulatory Visit

## 2021-03-26 ENCOUNTER — Telehealth: Payer: Self-pay | Admitting: Internal Medicine

## 2021-03-26 NOTE — Telephone Encounter (Signed)
Rescheduled per 10/20 pt request, pt confirmed new date

## 2021-03-27 ENCOUNTER — Inpatient Hospital Stay: Payer: Medicare Other

## 2021-03-27 ENCOUNTER — Other Ambulatory Visit: Payer: Self-pay

## 2021-03-27 ENCOUNTER — Other Ambulatory Visit: Payer: Self-pay | Admitting: Internal Medicine

## 2021-03-27 DIAGNOSIS — I4891 Unspecified atrial fibrillation: Secondary | ICD-10-CM | POA: Diagnosis not present

## 2021-03-27 DIAGNOSIS — Z7901 Long term (current) use of anticoagulants: Secondary | ICD-10-CM | POA: Diagnosis not present

## 2021-03-27 DIAGNOSIS — C3411 Malignant neoplasm of upper lobe, right bronchus or lung: Secondary | ICD-10-CM | POA: Diagnosis not present

## 2021-03-27 DIAGNOSIS — G4700 Insomnia, unspecified: Secondary | ICD-10-CM | POA: Diagnosis not present

## 2021-03-27 DIAGNOSIS — Z79899 Other long term (current) drug therapy: Secondary | ICD-10-CM | POA: Diagnosis not present

## 2021-03-27 DIAGNOSIS — C3491 Malignant neoplasm of unspecified part of right bronchus or lung: Secondary | ICD-10-CM

## 2021-03-27 DIAGNOSIS — K769 Liver disease, unspecified: Secondary | ICD-10-CM | POA: Diagnosis not present

## 2021-03-27 DIAGNOSIS — Z5111 Encounter for antineoplastic chemotherapy: Secondary | ICD-10-CM | POA: Diagnosis not present

## 2021-03-27 DIAGNOSIS — F32A Depression, unspecified: Secondary | ICD-10-CM | POA: Diagnosis not present

## 2021-03-27 LAB — CMP (CANCER CENTER ONLY)
ALT: 8 U/L (ref 0–44)
AST: 19 U/L (ref 15–41)
Albumin: 3 g/dL — ABNORMAL LOW (ref 3.5–5.0)
Alkaline Phosphatase: 93 U/L (ref 38–126)
Anion gap: 7 (ref 5–15)
BUN: 8 mg/dL (ref 8–23)
CO2: 24 mmol/L (ref 22–32)
Calcium: 8.5 mg/dL — ABNORMAL LOW (ref 8.9–10.3)
Chloride: 102 mmol/L (ref 98–111)
Creatinine: 0.63 mg/dL (ref 0.61–1.24)
GFR, Estimated: >60 mL/min (ref >60)
Glucose, Bld: 91 mg/dL (ref 70–99)
Potassium: 3.9 mmol/L (ref 3.5–5.1)
Sodium: 133 mmol/L — ABNORMAL LOW (ref 135–145)
Total Bilirubin: 0.7 mg/dL (ref 0.3–1.2)
Total Protein: 6.5 g/dL (ref 6.5–8.1)

## 2021-03-27 LAB — CBC WITH DIFFERENTIAL (CANCER CENTER ONLY)
Abs Immature Granulocytes: 0.01 10*3/uL (ref 0.00–0.07)
Basophils Absolute: 0 10*3/uL (ref 0.0–0.1)
Basophils Relative: 1 %
Eosinophils Absolute: 0 10*3/uL (ref 0.0–0.5)
Eosinophils Relative: 1 %
HCT: 29.7 % — ABNORMAL LOW (ref 39.0–52.0)
Hemoglobin: 10.1 g/dL — ABNORMAL LOW (ref 13.0–17.0)
Immature Granulocytes: 1 %
Lymphocytes Relative: 24 %
Lymphs Abs: 0.4 10*3/uL — ABNORMAL LOW (ref 0.7–4.0)
MCH: 30.8 pg (ref 26.0–34.0)
MCHC: 34 g/dL (ref 30.0–36.0)
MCV: 90.5 fL (ref 80.0–100.0)
Monocytes Absolute: 0.4 10*3/uL (ref 0.1–1.0)
Monocytes Relative: 24 %
Neutro Abs: 0.7 10*3/uL — ABNORMAL LOW (ref 1.7–7.7)
Neutrophils Relative %: 49 %
Platelet Count: 122 10*3/uL — ABNORMAL LOW (ref 150–400)
RBC: 3.28 MIL/uL — ABNORMAL LOW (ref 4.22–5.81)
RDW: 17.9 % — ABNORMAL HIGH (ref 11.5–15.5)
WBC Count: 1.4 10*3/uL — ABNORMAL LOW (ref 4.0–10.5)
nRBC: 0 % (ref 0.0–0.2)

## 2021-03-30 ENCOUNTER — Ambulatory Visit (HOSPITAL_COMMUNITY)
Admission: RE | Admit: 2021-03-30 | Discharge: 2021-03-30 | Disposition: A | Discharge status: Home / Self Care | Payer: Medicare Other | Source: Ambulatory Visit | Attending: Internal Medicine | Admitting: Internal Medicine

## 2021-03-30 ENCOUNTER — Other Ambulatory Visit: Payer: Self-pay

## 2021-03-30 DIAGNOSIS — C3490 Malignant neoplasm of unspecified part of unspecified bronchus or lung: Secondary | ICD-10-CM | POA: Diagnosis not present

## 2021-03-30 DIAGNOSIS — K769 Liver disease, unspecified: Secondary | ICD-10-CM | POA: Diagnosis not present

## 2021-03-30 DIAGNOSIS — R160 Hepatomegaly, not elsewhere classified: Secondary | ICD-10-CM | POA: Diagnosis not present

## 2021-03-30 DIAGNOSIS — J90 Pleural effusion, not elsewhere classified: Secondary | ICD-10-CM | POA: Diagnosis not present

## 2021-03-30 IMAGING — CT CT ABD-PELV W/ CM
2 of 5 series · 15 of 46 positions shown, 17 images · IV contrast (OMNIPAQUE)
Comparison: CT [DATE]

CLINICAL DATA: Fall non-small cell lung cancer.

EXAM:
CT ABDOMEN AND PELVIS WITH CONTRAST
TECHNIQUE: Multidetector CT imaging of the abdomen and pelvis was performed
using the standard protocol following bolus administration of
intravenous contrast.
CONTRAST:  80mL OMNIPAQUE IOHEXOL 350 MG/ML SOLN

[Series 2: axial st · axial · 0.71mm/px · z∈[-738,-363]mm · 12 of 89 slices shown, 14 images]
[im 7/89  soft-tissue]
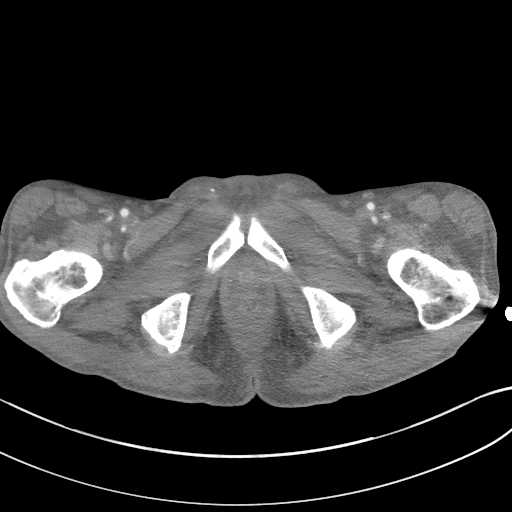
[im 7/89  bone]
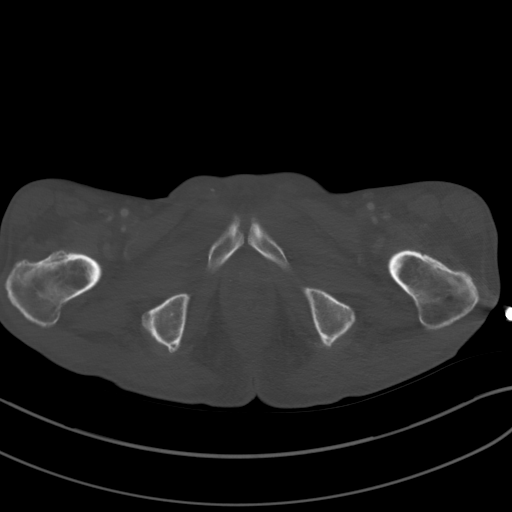
[im 14/89  soft-tissue]
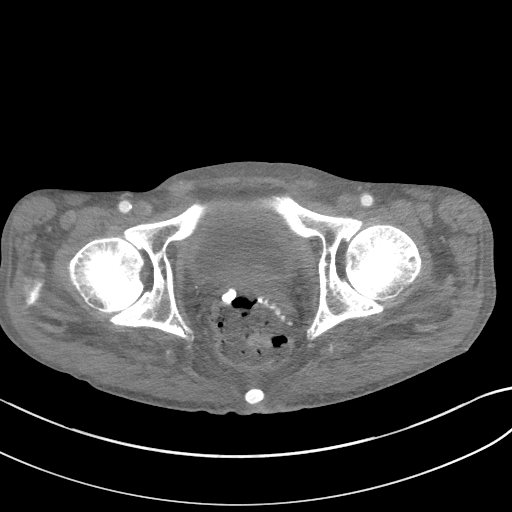
[im 21/89  soft-tissue]
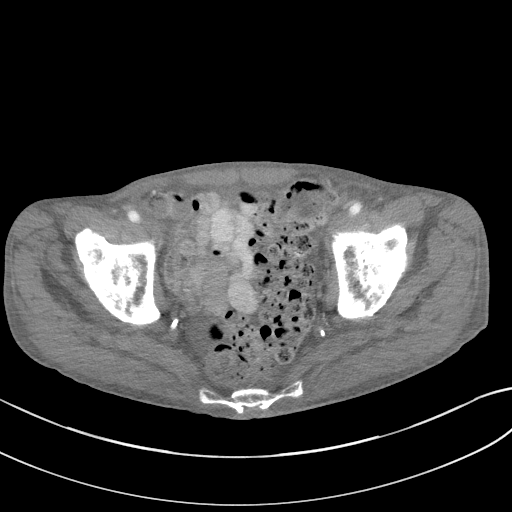
[im 28/89  soft-tissue]
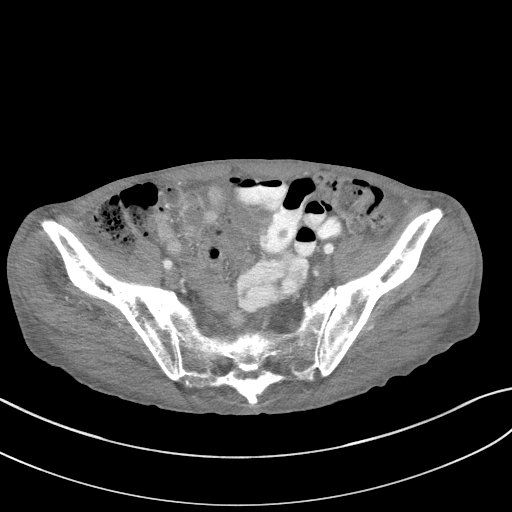
[im 34/89  soft-tissue]
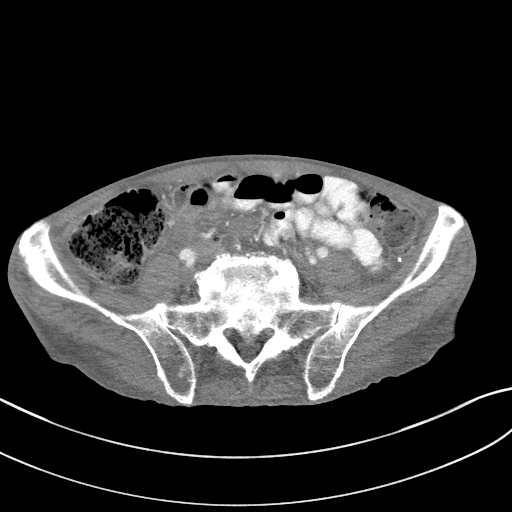
[im 41/89  soft-tissue]
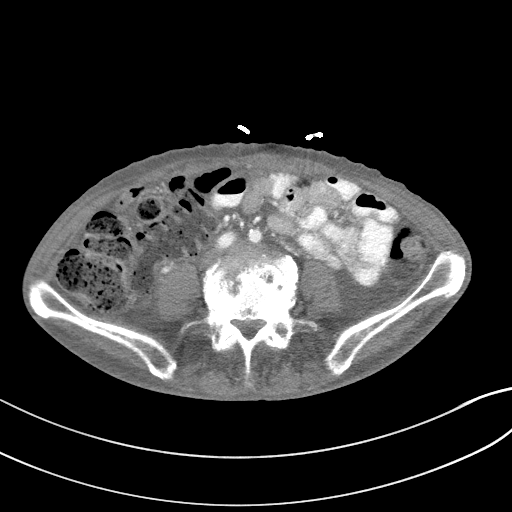
[im 48/89  soft-tissue]
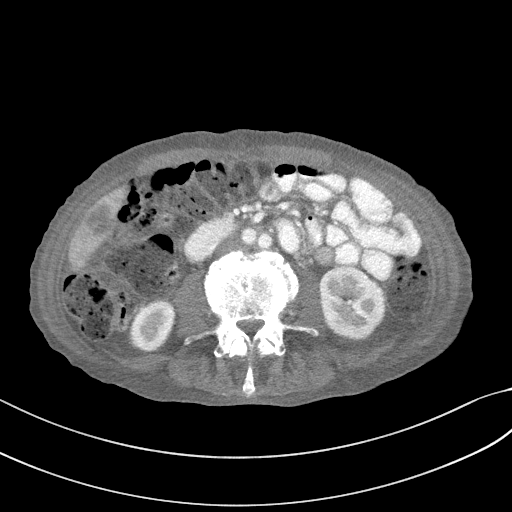
[im 55/89  soft-tissue]
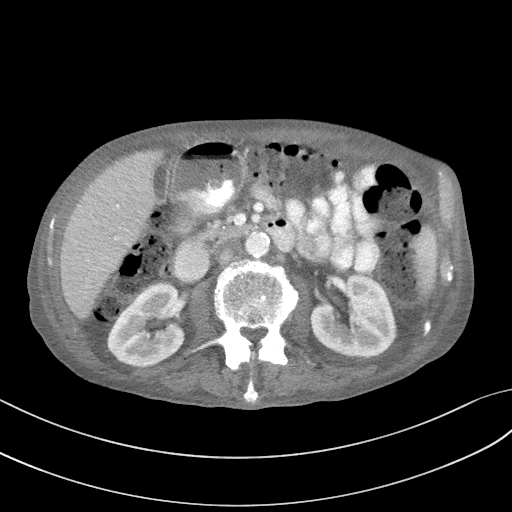
[im 61/89  soft-tissue]
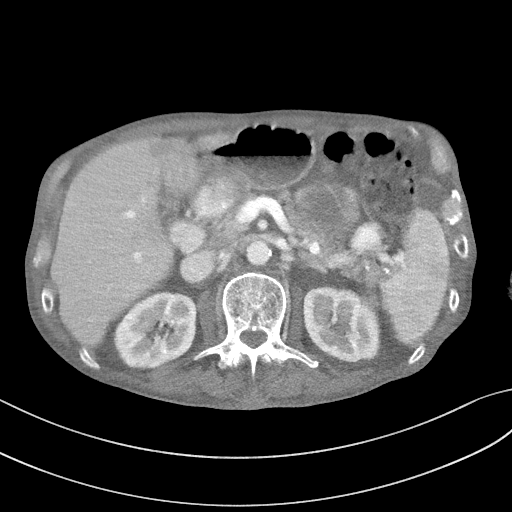
[im 61/89  bone]
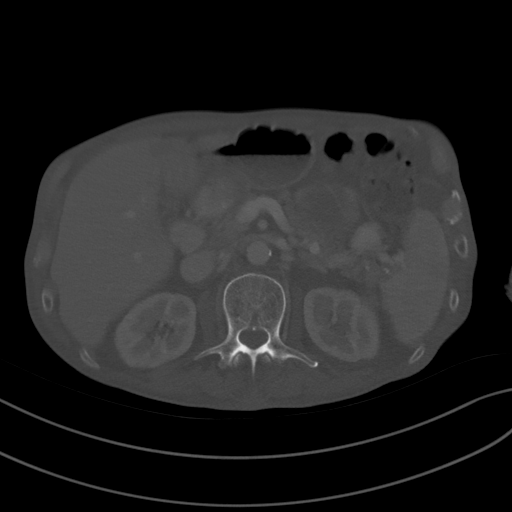
[im 68/89  soft-tissue]
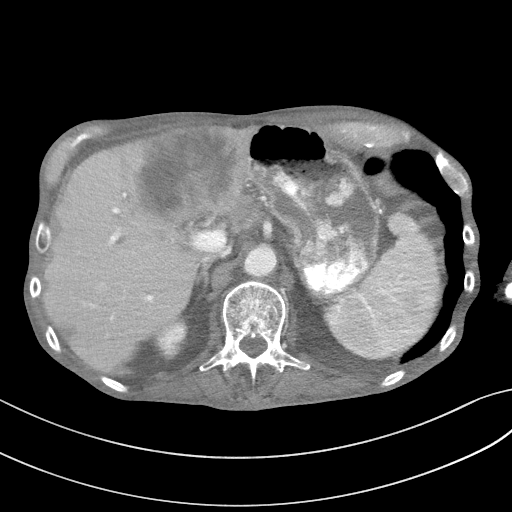
[im 75/89  soft-tissue]
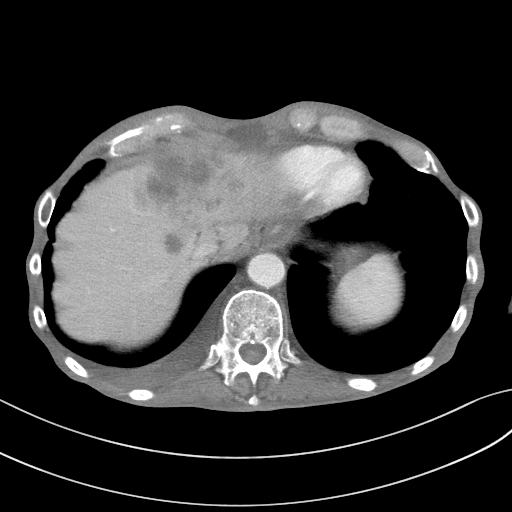
[im 82/89  soft-tissue]
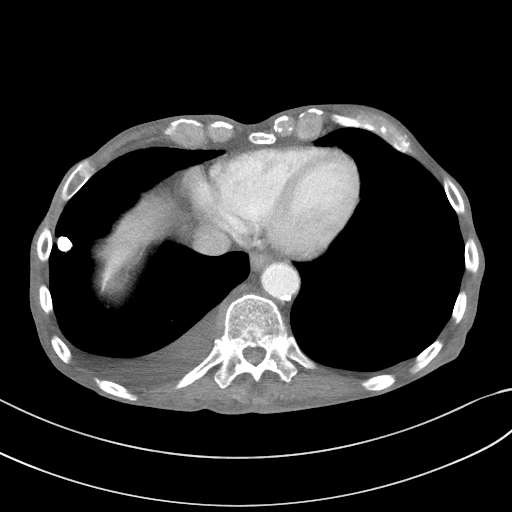

[Series 5: coronal st · coronal · 0.72mm/px · 3 of 77 slices shown]
[im 26/77  soft-tissue]
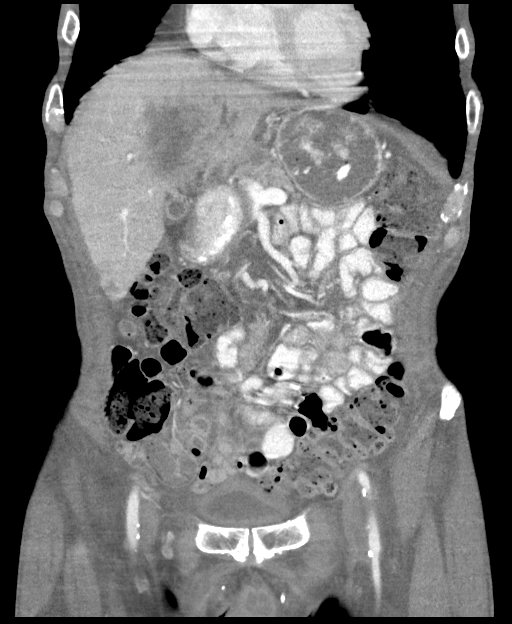
[im 34/77  soft-tissue]
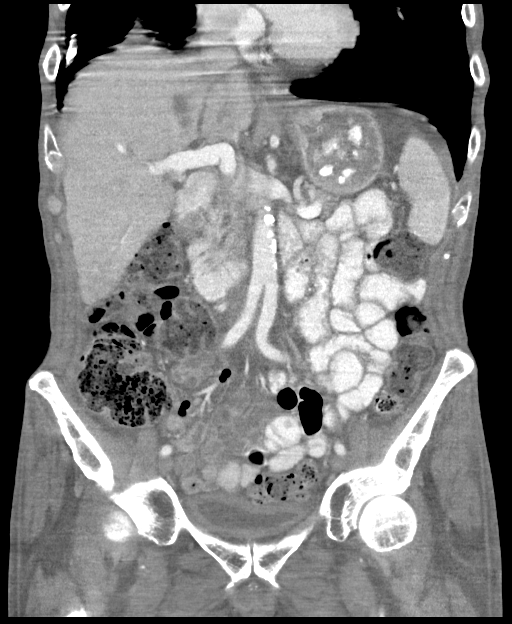
[im 43/77  soft-tissue]
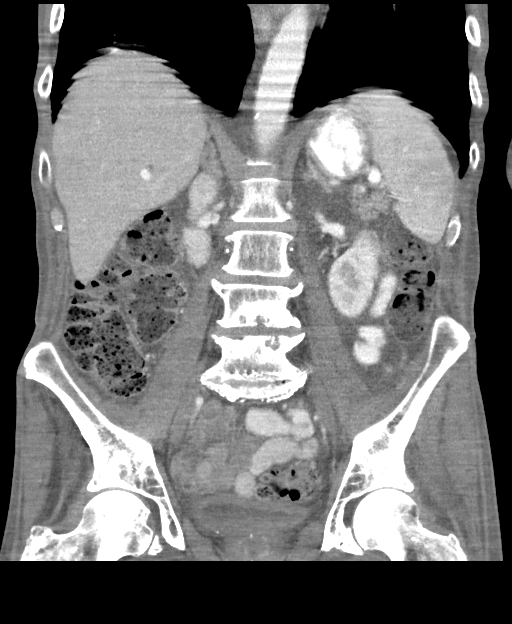

[15 of 46 positions shown; findings below may reference images not displayed]

FINDINGS: Lower chest: Lung bases are clear. Small to moderate RIGHT pleural
effusion increased from prior

Hepatobiliary: Large infiltrative mass in the LEFT hepatic lobe
measures 7.4 x 4.5 cm compared with 8.9 by 6.1 cm. Mild biliary duct
dilatation in the lateral segment of the LEFT hepatic lobe related
to the mass. No change from prior. Several satellite lesions
adjacent to the dominant mass. For example lesion central left
hepatic lobe measuring 13 mm (image [DATE]) compared to 16 mm no new
lesions are present.

Lesion in the inferior tip of the RIGHT hepatic lobe measuring 20 mm
(image [DATE]/coronal) compares to 20 mm.

Periportal lymph nodes described on comparison damage decreased. For
example lymph node in the gastrohepatic ligament measuring 9 mm
short axis (image [DATE]) decreased from 19 mm. No new adenopathy.

Pancreas: Pancreas is normal. No ductal dilatation. No pancreatic
inflammation.

Spleen: Normal spleen

Adrenals/urinary tract: Adrenal glands and kidneys are normal. The
ureters and bladder normal.

Stomach/Bowel: Stomach, small bowel, and cecum are normal. The colon
and rectosigmoid colon are normal. Moderate volume stool throughout
the colon.

Vascular/Lymphatic: Abdominal aorta is normal caliber. No periportal
or retroperitoneal adenopathy. No pelvic adenopathy.

Reproductive: Prostate unremarkable

Other: No free fluid.

Musculoskeletal: Sclerotic endplate changes with subchondral
lucencies at L4-L5 are unchanged in favored chronic degenerative
change.
IMPRESSION: 1. Interval increase in small to moderate RIGHT pleural effusion.
2. Interval decrease in size of dominant mass in the LEFT hepatic
lobe. No new lesions are present.
3. Interval decrease in size of periportal lymph nodes.
4. No evidence of metastatic carcinomatosis or peritoneal disease.
5. Moderate volume stool throughout the colon suggest constipation.

## 2021-03-30 MED ORDER — IOHEXOL 350 MG/ML SOLN
80.0000 mL | Freq: Once | INTRAVENOUS | Status: AC | PRN
  Administered 2021-03-30: 80 mL via INTRAVENOUS

## 2021-03-30 NOTE — Progress Notes (Signed)
[place] OFFICE PROGRESS NOTE  Koirala, Dibas, MD [place] [address] [address]  DIAGNOSIS: Metastatic non-small cell lung cancer initially diagnosed as stage IIIA/B (T3, N1/N2, M0) non-small cell lung cancer, adenocarcinoma.  He presented with a right upper lobe lung mass with direct extension across the major fissure into the superior segment of the right lower lobe and ipsilateral hilar lymphadenopathy.  There is questionable subcarinal lymphadenopathy versus esophageal hypermetabolism this was evaluated by gastroenterology with upper endoscopy on 01/30/2020 and there was no mass lesion or malignancy in that area. The patient was diagnosed in May 2021.     Biomarker Findings Microsatellite status - MS-Stable Tumor Mutational Burden - 9 Muts/Mb Genomic Findings For a complete list of the genes assayed, please refer to the Appendix. KRAS G12C CDKN2A/B [id number] S12* NKX2-1 amplification - equivocal? PMS2 R3Q TP53 P49fs*38 7 Disease relevant genes with no reportable alterations: ALK, BRAF, EGFR, ERBB2, MET, RET, ROS1   REPEAT MOLECULAR STUDIES BY [facility] ON THE LIVER BIOPSY:  Results with Therapy Associations BIOMARKER METHOD ANALYTE RESULT THERAPY ASSOCIATION BIOMARKER LEVEL* . alectinib, ceritinib, crizotinib, lorlatinib Level 1 . IHC Protein Negative  0 brigatinib Level 2 . ALK Seq RNA-Tumor Fusion Not Detected LACK OF BENEFIT alectinib, brigatinib, ceritinib, crizotinib, lorlatinib Level 2 .BRAF Seq DNA-Tumor Mutation Not Detected LACK OF BENEFIT dabrafenib and trametinib combination therapy, vemurafenib Level 2 .EGFR Seq DNA-Tumor Mutation Not Detected LACK OF BENEFIT erlotinib, gefitinib Level 2 .KRAS Seq DNA-Tumor Mutation Not Detected LACK OF BENEFIT sotorasib Level 2 .RET Seq RNA-Tumor Fusion Not Detected LACK OF BENEFIT pralsetinib, selpercatinib Level 2 .ROS1 Seq RNA-Tumor Fusion Not Detected LACK OF BENEFIT ceritinib,  crizotinib, entrectinib, lorlatinib Level 2 . CNA-Seq DNA-Tumor Amplification Not Detected .MET Seq RNA-Tumor Variant Transcript Not Detected LACK OFBENEFIT crizotinib Level 3   PDL1: 70%  PRIOR THERAPY: 1) Weekly concurrent chemoradiation with carboplatin for an AUC of 2 and paclitaxel 45 mg/m.  First dose expected on 11/26/2019. Status post 7 cycles.   Last dose was given 01/07/2020 with partial response. 2) Consolidation treatment with immunotherapy with Imfinzi 1500 mg IV every 4 weeks.  First dose 02/19/2020. Status post 6 cycles.  Discontinued secondary to disease progression. 3) Lumakras (Sotorasib) 960 mg p.o. daily.  First dose started December 12, 2020.  Status post 1 months of treatment discontinued secondary to disease progression in the liver  CURRENT THERAPY: Systemic chemotherapy with carboplatin for AUC of 5, Alimta 500 Mg/M2 and Keytruda 200 Mg IV every 3 weeks.  First dose 01/22/2021.  Status post 3 cycles.   INTERVAL HISTORY: Christopher Harding 70 y.o. male returns to the clinic today for a follow-up visit accompanied by his wife. He is currently undergoing palliative systemic chemotherapy and he has been having a challenging time with treatment.  They actually met with hospice recently to consider transitioning to hospice care.  The patient did get a blood transfusion last week and felt a lot better though and they did not proceed with hospice as of yet.  The patient and his wife are hoping to rediscuss his prognosis today.   Despite getting 2 units of blood and having improvement in his symptoms, he still continues to have significant generalized weakness and fatigue.  For pain, he takes MS contin 30 mg every 8-12 hours. He denies any fever, chills, or night sweats.  He gained a pound.  He did not start taking Megace for his appetite. He quit smoking which helped his appetite. Regarding  the appetite, the patient is being followed by member the nutritionist team.  He denies any nausea or  vomiting.  He has mild occasional diarrhea but nothing significant reportedly. If needed, they will take imodium.  Denies any constipation.  Denies any cough, hemoptysis, or chest pain.  He reports dyspnea on exertion which he has "good days and bad days".  He feels like sometimes his breathing is more labored in the morning but notices that Xanax helps.  He has been having issues with anxiety and panic attacks which is managed by his PCP.  He denies any headache or visual changes.  He has some insomnia.  The patient recently had a restaging CT scan performed.  He is here today for evaluation to review his scan results before considering starting cycle #4.       MEDICAL HISTORY: Past Medical History:  Diagnosis Date   A-fib [facility])    s/p DCCV 12/07/18   Cancer [facility])    Depression    situational   Dysrhythmia    Hypertension    Persistent atrial fibrillation ([place]) 11/23/2018   Persistent atrial fibrillation   Tobacco abuse 11/23/2018    ALLERGIES:  has No Known Allergies.  MEDICATIONS:  Current Outpatient Medications  Medication Sig Dispense Refill   ALPRAZolam (XANAX) 0.25 MG tablet Take 0.25 mg by mouth 2 (two) times daily as needed.     folic acid (FOLVITE) 1 MG tablet Take 1 tablet (1 mg total) by mouth daily. 30 tablet 4   megestrol (MEGACE ES) 625 MG/5ML suspension Take 5 mLs (625 mg total) by mouth daily. (Patient not taking: Reported on 03/01/2021) 150 mL 0   metoprolol tartrate (LOPRESSOR) 50 MG tablet Take 50 mg by mouth 2 (two) times daily with a meal.     mirtazapine (REMERON) 30 MG tablet TAKE 1 TABLET BY MOUTH AT BEDTIME. 90 tablet 1   morphine (MS CONTIN) 30 MG 12 hr tablet Take 1 tablet (30 mg total) by mouth every 12 (twelve) hours. 60 tablet 0   oxyCODONE (OXY IR/ROXICODONE) 5 MG immediate release tablet Take 1 tablet (5 mg total) by mouth 3 (three) times daily as needed for severe pain. (Patient not taking: Reported on 03/01/2021) 30 tablet 0   polyethylene glycol (MIRALAX /  GLYCOLAX) 17 g packet Take 17 g by mouth daily.     prochlorperazine (COMPAZINE) 10 MG tablet Take 1 tablet (10 mg total) by mouth every 6 (six) hours as needed for nausea or vomiting. 30 tablet 0   XARELTO 20 MG TABS tablet Take 20 mg by mouth daily.     No current facility-administered medications for this visit.   Facility-Administered Medications Ordered in Other Visits  Medication Dose Route Frequency Provider Last Rate Last Admin   0.9 %  sodium chloride infusion   Intravenous Once Curt Bears, MD       CARBOplatin (PARAPLATIN) 350 mg in sodium chloride 0.9 % 100 mL chemo infusion  350 mg Intravenous Once Curt Bears, MD       dexamethasone (DECADRON) 10 mg in sodium chloride 0.9 % 50 mL IVPB  10 mg Intravenous Once Curt Bears, MD       fosaprepitant (EMEND) 150 mg in sodium chloride 0.9 % 145 mL IVPB  150 mg Intravenous Once Curt Bears, MD       palonosetron (ALOXI) injection 0.25 mg  0.25 mg Intravenous Once Curt Bears, MD       pembrolizumab [facility]) 200 mg in sodium chloride 0.9 %  50 mL chemo infusion  200 mg Intravenous Once Curt Bears, MD       PEMEtrexed (ALIMTA) 700 mg in sodium chloride 0.9 % 100 mL chemo infusion  400 mg/m2 (Treatment Plan Recorded) Intravenous Once Curt Bears, MD        SURGICAL HISTORY:  Past Surgical History:  Procedure Laterality Date   CARDIOVERSION N/A 12/07/2018   Procedure: CARDIOVERSION;  Surgeon: Josue Hector, MD;  Location: [facility] ENDOSCOPY;  Service: Cardiovascular;  Laterality: N/A;   FINE NEEDLE ASPIRATION  10/30/2019   Procedure: FINE NEEDLE ASPIRATION (FNA) LINEAR;  Surgeon: Candee Furbish, MD;  Location: [facility] ENDOSCOPY;  Service: Pulmonary;;   INGUINAL HERNIA REPAIR Right 01/25/2019   Procedure: RIGHT INGUINAL HERNIA REPAIR WITH MESH;  Surgeon: Coralie Keens, MD;  Location: [place];  Service: General;  Laterality: Right;  TAP BLOCK   INSERTION OF MESH Right 01/25/2019   Procedure: Insertion Of Mesh;   Surgeon: Coralie Keens, MD;  Location: [place];  Service: General;  Laterality: Right;   VIDEO BRONCHOSCOPY WITH ENDOBRONCHIAL ULTRASOUND N/A 10/30/2019   Procedure: VIDEO BRONCHOSCOPY WITH ENDOBRONCHIAL ULTRASOUND;  Surgeon: Candee Furbish, MD;  Location: [facility] ENDOSCOPY;  Service: Pulmonary;  Laterality: N/A;    REVIEW OF SYSTEMS:   Review of Systems  Constitutional: Positive for fatigue and decreased appetite.  Negative for chills, fever and unexpected weight change.  HENT: Negative for mouth sores, nosebleeds, sore throat and trouble swallowing.   Eyes: Negative for eye problems and icterus.  Respiratory: Positive for dyspnea on exertion.  Negative for cough, hemoptysis, and wheezing.   Cardiovascular: Negative for chest pain and leg swelling.  Gastrointestinal: Positive for occasional diarrhea.  Negative for abdominal pain, constipation, nausea and vomiting.  Genitourinary: Negative for bladder incontinence, difficulty urinating, dysuria, frequency and hematuria.   Musculoskeletal: Negative for back pain, gait problem, neck pain and neck stiffness.  Skin: Negative for itching and rash.  Neurological: Negative for dizziness, extremity weakness, gait problem, headaches, light-headedness and seizures.  Hematological: Negative for adenopathy. Does not bruise/bleed easily.  Psychiatric/Behavioral: Negative for confusion, depression and sleep disturbance. The patient is not nervous/anxious.     PHYSICAL EXAMINATION:  Blood pressure 134/82, pulse (!) 50, temperature (!) 96.6 F (35.9 C), temperature source Tympanic, resp. rate 16, height $RemoveBe'6\' 2"'zUJolEfSM$  (1.88 m), weight 134 lb (60.8 kg), SpO2 100 %.  ECOG PERFORMANCE STATUS: 2-3  Physical Exam  Constitutional: Oriented to person, place, and time and then appearing male.  HENT:  Head: Normocephalic and atraumatic.  Mouth/Throat: Oropharynx is clear and moist. No oropharyngeal exudate.  Eyes: Conjunctivae are normal. Right eye exhibits no discharge.  Left eye exhibits no discharge. No scleral icterus.  Neck: Normal range of motion. Neck supple.  Cardiovascular: Bradycardic, regular rhythm, normal heart sounds and intact distal pulses.   Pulmonary/Chest: Effort normal and breath sounds normal. No respiratory distress. No wheezes. No rales.  Abdominal: Soft. Bowel sounds are normal. Exhibits no distension and no mass. There is no tenderness.  Musculoskeletal: Normal range of motion. Exhibits no edema.  Lymphadenopathy:    No cervical adenopathy.  Neurological: Alert and oriented to person, place, and time. Exhibits muscle wasting.   Skin: Skin is warm and dry. No rash noted. Not diaphoretic. No erythema. No pallor.  Psychiatric: Mood, memory and judgment normal.  Vitals reviewed.  LABORATORY DATA: Lab Results  Component Value Date   WBC 2.7 (L) 04/01/2021   HGB 9.7 (L) 04/01/2021   HCT 28.8 (L) 04/01/2021   MCV 92.3  04/01/2021   PLT 262 04/01/2021      Chemistry      Component Value Date/Time   NA 136 04/01/2021 1049   NA 129 (L) 11/24/2018 1520   K 3.9 04/01/2021 1049   CL 103 04/01/2021 1049   CO2 22 04/01/2021 1049   BUN 6 (L) 04/01/2021 1049   BUN 8 11/24/2018 1520   CREATININE 0.63 04/01/2021 1049      Component Value Date/Time   CALCIUM 8.6 (L) 04/01/2021 1049   ALKPHOS 98 04/01/2021 1049   AST 13 (L) 04/01/2021 1049   ALT <5 04/01/2021 1049   BILITOT 0.5 04/01/2021 1049       RADIOGRAPHIC STUDIES:  CT Abdomen Pelvis W Contrast  Result Date: 03/30/2021 CLINICAL DATA:  Fall non-small cell lung cancer. EXAM: CT ABDOMEN AND PELVIS WITH CONTRAST TECHNIQUE: Multidetector CT imaging of the abdomen and pelvis was performed using the standard protocol following bolus administration of intravenous contrast. CONTRAST:  31mL OMNIPAQUE IOHEXOL 350 MG/ML SOLN COMPARISON:  CT 01/12/2021 FINDINGS: Lower chest: Lung bases are clear. Small to moderate RIGHT pleural effusion increased from prior Hepatobiliary: Large  infiltrative mass in the LEFT hepatic lobe measures 7.4 x 4.5 cm compared with 8.9 by 6.1 cm. Mild biliary duct dilatation in the lateral segment of the LEFT hepatic lobe related to the mass. No change from prior. Several satellite lesions adjacent to the dominant mass. For example lesion central left hepatic lobe measuring 13 mm (image 15/2) compared to 16 mm no new lesions are present. Lesion in the inferior tip of the RIGHT hepatic lobe measuring 20 mm (image 30/5/coronal) compares to 20 mm. Periportal lymph nodes described on comparison damage decreased. For example lymph node in the gastrohepatic ligament measuring 9 mm short axis (image 21/2) decreased from 19 mm. No new adenopathy. Pancreas: Pancreas is normal. No ductal dilatation. No pancreatic inflammation. Spleen: Normal spleen Adrenals/urinary tract: Adrenal glands and kidneys are normal. The ureters and bladder normal. Stomach/Bowel: Stomach, small bowel, and cecum are normal. The colon and rectosigmoid colon are normal. Moderate volume stool throughout the colon. Vascular/Lymphatic: Abdominal aorta is normal caliber. No periportal or retroperitoneal adenopathy. No pelvic adenopathy. Reproductive: Prostate unremarkable Other: No free fluid. Musculoskeletal: Sclerotic endplate changes with subchondral lucencies at L4-L5 are unchanged in favored chronic degenerative change. IMPRESSION: 1. Interval increase in small to moderate RIGHT pleural effusion. 2. Interval decrease in size of dominant mass in the LEFT hepatic lobe. No new lesions are present. 3. Interval decrease in size of periportal lymph nodes. 4. No evidence of metastatic carcinomatosis or peritoneal disease. 5. Moderate volume stool throughout the colon suggest constipation. Electronically Signed   By: Suzy Bouchard M.D.   On: 03/30/2021 19:05     ASSESSMENT/PLAN:  This is a very pleasant 70 year old Caucasian male diagnosed presently with metastatic non-small cell lung cancer which was  initially diagnosed as a stage IIIa/B (T3, N1/N2, M0) non-small cell lung cancer, adenocarcinoma. He initially presented with a right upper lobe lung mass with direct extension across the major fissure into the superior segment of the right upper lobe and ipsilateral hilar lymphadenopathy as well as questionable subcarinal lymphadenopathy.  The patient had evidence of metastatic disease confirmed in June 2022.  The patient is positive for a K-ras G12C mutation which was used in the second line setting.   The patient initially underwent treatment with concurrent chemoradiation with weekly carboplatin for AUC of 2 and paclitaxel 45 mg per metered square.  He is status post  7 cycles.  He tolerated treatment well without any concerning adverse side effects except for mild sore throat. And a partial response to treatment.  He then underwent consolidation immunotherapy with Imfinzi 1500 mg IV every 4 weeks.  He is status post 6 cycles.  He tolerated this treatment well but it was discontinued due to evidence of disease progression.  The patient had developed a suspicious liver lesion concerning for metastatic disease.  This was biopsied under ultrasound guidance and the final pathology was read at [facility] in addition to [facility] which showed no evidence of malignancy and there was suspicious inflammatory process of the biliary origin.  The patient has been on observation but the liver lesion continue to increase in size.  He underwent another ultrasound-guided biopsy of the liver. 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 His molecular studies from the liver biopsy showed no actionable mutations and it is unclear the discrepancy between the previous lung biopsy molecular study as well as the liver biopsy molecular studies except that the new lesion in the liver is consistent with poorly  differentiated squamous cell carcinoma compared to the lung biopsy that was adenocarcinoma at that time.   The patient was then started on second line targeted treatment with Lumakras 960 mg p.o. daily.  He was on this for 1 month but repeat imaging showed evidence of progressive disease in the liver.  Therefore, this was discontinued.  The patient is currently undergoing palliative systemic chemotherapy with carboplatin for an AUC of 5, Alimta 500 mg per metered squared, and Keytruda 200 mg IV every 3 weeks.  He is status post 3 cycles.  The patient was seen with Dr. Julien Nordmann today.  The patient recently had a restaging CT scan of the abdomen pelvis.  Dr. Julien Nordmann personally and independently reviewed the scan and discussed results with the patient today.  The scan showed a positive response to treatment.  Dr. Julien Nordmann had a lengthy discussion with the patient about his current condition and recommended treatment options.  Dr. Julien Nordmann discussed that starting from next cycle of treatment he would start maintenance treatment.  Dr. Julien Nordmann we discussed that treatment is palliative in nature.  If at any point in time, the patient is unable to tolerate treatment or decides to stop treatment, then he may always consider hospice.  The patient is wife decided to proceed with treatment today as scheduled.  Dr. Julien Nordmann will reduce his dose of carboplatin to an AUC of 4 and Alimta to 400 mg per metered squared.  His ANC is 1.4 today.  Per Dr. Julien Nordmann, he is okay to treat.   Will add a sample to blood bank to his weekly labs for the next few weeks.  If his hemoglobin drops less than 8, then we will arrange for a blood transfusion.  We will see him back for follow-up visit in 3 weeks for evaluation before starting cycle #5.    He will continue taking MS Contin for pain control.   Continue to follow with his primary care provider regarding his panic attacks.  Regarding his insomnia, given that he is on Xanax and  morphine, I would not recommend a strong medication for insomnia.  Advised him to use Tylenol PM if needed or melatonin.  I did discuss not to take this at the same time as his antianxiety or pain medication.  The patient was advised to call immediately if he  has any concerning symptoms in the interval. The patient voices understanding of current disease status and treatment options and is in agreement with the current care plan. All questions were answered. The patient knows to call the clinic with any problems, questions or concerns. We can certainly see the patient much sooner if necessary      Orders Placed This Encounter  Procedures   Sample to Blood Bank    Standing Status:   Standing    Number of Occurrences:   3    Standing Expiration Date:   04/01/2022      Tobe Sos Jacobi Nile, PA-C 04/01/21  ADDENDUM: Hematology/Oncology Attending: I had a face-to-face encounter with the patient today.  I reviewed his record, lab, scan and recommended his care plan.  This is a very pleasant 70 years old white male who was initially diagnosed as a stage IIIb non-small cell lung cancer, adenocarcinoma with positive KRAS G12C mutation.  He is status post a course of concurrent chemoradiation followed by consolidation treatment with immunotherapy that was discontinued after 6 cycles secondary to disease progression. The patient is started on treatment with Lumakras (Sotorasib) 960 mg p.o. daily for 1 months but he continues to have disease progression and intolerance. The patient started systemic chemotherapy with carboplatin for AUC of 5, Alimta 500 Mg/M2 and Keytruda 200 Mg IV every 3 weeks status post 3 cycles.  He is tolerating the treatment well except for fatigue secondary to chemotherapy-induced anemia requiring PRBCs transfusion. The patient had repeat CT scan of the chest, abdomen pelvis performed recently.  I personally and independently reviewed the scan images and discussed the  results with the patient and his wife. His scan showed improvement of his disease with decrease in the size of the liver lesions. I had a lengthy discussion with the patient and his wife today again about his current condition and prognosis as well as the treatment options.  I gave the patient again the option of palliative care and hospice if needed especially with the intolerance of his treatment versus proceeding with additional treatment and adjusting his dose of the chemotherapy. The patient is interested in continuing his current treatment. I will reduce the dose of carboplatin to AUC of 4 and Alimta 400 Mg/M2 starting from cycle #4. We will see the patient back for follow-up visit in 3 weeks for evaluation before starting cycle #5 which will be the first cycle of maintenance therapy with Alimta and Keytruda. For pain management the patient will continue with his current pain medication. He was advised to call immediately if he has any other concerning symptoms in the interval.  The total time spent in the appointment was 30 minutes. Disclaimer: This note was dictated with voice recognition software. Similar sounding words can inadvertently be transcribed and may be missed upon review. Eilleen Kempf, MD 04/01/21

## 2021-03-31 ENCOUNTER — Telehealth: Payer: Self-pay | Admitting: Medical Oncology

## 2021-03-31 DIAGNOSIS — I4891 Unspecified atrial fibrillation: Secondary | ICD-10-CM | POA: Diagnosis not present

## 2021-03-31 DIAGNOSIS — D6869 Other thrombophilia: Secondary | ICD-10-CM | POA: Diagnosis not present

## 2021-03-31 DIAGNOSIS — I10 Essential (primary) hypertension: Secondary | ICD-10-CM | POA: Diagnosis not present

## 2021-03-31 DIAGNOSIS — C3491 Malignant neoplasm of unspecified part of right bronchus or lung: Secondary | ICD-10-CM | POA: Diagnosis not present

## 2021-03-31 NOTE — Telephone Encounter (Signed)
Wife stated pt is unable to sit up for 3-4 hour treatment .  She requests all his tx appt  on  10/26 and future be scheduled in a bed.

## 2021-04-01 ENCOUNTER — Encounter: Payer: Self-pay | Admitting: Internal Medicine

## 2021-04-01 ENCOUNTER — Other Ambulatory Visit: Payer: Self-pay

## 2021-04-01 ENCOUNTER — Inpatient Hospital Stay: Payer: Medicare Other

## 2021-04-01 ENCOUNTER — Inpatient Hospital Stay: Payer: Medicare Other | Admitting: Dietician

## 2021-04-01 ENCOUNTER — Inpatient Hospital Stay (HOSPITAL_BASED_OUTPATIENT_CLINIC_OR_DEPARTMENT_OTHER): Payer: Medicare Other | Admitting: Physician Assistant

## 2021-04-01 VITALS — BP 134/82 | HR 50 | Temp 96.6°F | Resp 16 | Ht 74.0 in | Wt 134.0 lb

## 2021-04-01 DIAGNOSIS — C3491 Malignant neoplasm of unspecified part of right bronchus or lung: Secondary | ICD-10-CM

## 2021-04-01 DIAGNOSIS — G4700 Insomnia, unspecified: Secondary | ICD-10-CM | POA: Diagnosis not present

## 2021-04-01 DIAGNOSIS — Z79899 Other long term (current) drug therapy: Secondary | ICD-10-CM | POA: Diagnosis not present

## 2021-04-01 DIAGNOSIS — Z5111 Encounter for antineoplastic chemotherapy: Secondary | ICD-10-CM | POA: Diagnosis not present

## 2021-04-01 DIAGNOSIS — C3411 Malignant neoplasm of upper lobe, right bronchus or lung: Secondary | ICD-10-CM | POA: Diagnosis not present

## 2021-04-01 DIAGNOSIS — D649 Anemia, unspecified: Secondary | ICD-10-CM | POA: Diagnosis not present

## 2021-04-01 DIAGNOSIS — I4891 Unspecified atrial fibrillation: Secondary | ICD-10-CM | POA: Diagnosis not present

## 2021-04-01 DIAGNOSIS — Z5112 Encounter for antineoplastic immunotherapy: Secondary | ICD-10-CM

## 2021-04-01 DIAGNOSIS — K769 Liver disease, unspecified: Secondary | ICD-10-CM | POA: Diagnosis not present

## 2021-04-01 DIAGNOSIS — Z7901 Long term (current) use of anticoagulants: Secondary | ICD-10-CM | POA: Diagnosis not present

## 2021-04-01 DIAGNOSIS — F32A Depression, unspecified: Secondary | ICD-10-CM | POA: Diagnosis not present

## 2021-04-01 LAB — CMP (CANCER CENTER ONLY)
ALT: <5 U/L (ref 0–44)
AST: 13 U/L — ABNORMAL LOW (ref 15–41)
Albumin: 2.7 g/dL — ABNORMAL LOW (ref 3.5–5.0)
Alkaline Phosphatase: 98 U/L (ref 38–126)
Anion gap: 11 (ref 5–15)
BUN: 6 mg/dL — ABNORMAL LOW (ref 8–23)
CO2: 22 mmol/L (ref 22–32)
Calcium: 8.6 mg/dL — ABNORMAL LOW (ref 8.9–10.3)
Chloride: 103 mmol/L (ref 98–111)
Creatinine: 0.63 mg/dL (ref 0.61–1.24)
GFR, Estimated: >60 mL/min (ref >60)
Glucose, Bld: 81 mg/dL (ref 70–99)
Potassium: 3.9 mmol/L (ref 3.5–5.1)
Sodium: 136 mmol/L (ref 135–145)
Total Bilirubin: 0.5 mg/dL (ref 0.3–1.2)
Total Protein: 6.1 g/dL — ABNORMAL LOW (ref 6.5–8.1)

## 2021-04-01 LAB — CBC WITH DIFFERENTIAL (CANCER CENTER ONLY)
Abs Immature Granulocytes: 0.02 10*3/uL (ref 0.00–0.07)
Basophils Absolute: 0 10*3/uL (ref 0.0–0.1)
Basophils Relative: 1 %
Eosinophils Absolute: 0 10*3/uL (ref 0.0–0.5)
Eosinophils Relative: 0 %
HCT: 28.8 % — ABNORMAL LOW (ref 39.0–52.0)
Hemoglobin: 9.7 g/dL — ABNORMAL LOW (ref 13.0–17.0)
Immature Granulocytes: 1 %
Lymphocytes Relative: 16 %
Lymphs Abs: 0.4 10*3/uL — ABNORMAL LOW (ref 0.7–4.0)
MCH: 31.1 pg (ref 26.0–34.0)
MCHC: 33.7 g/dL (ref 30.0–36.0)
MCV: 92.3 fL (ref 80.0–100.0)
Monocytes Absolute: 0.8 10*3/uL (ref 0.1–1.0)
Monocytes Relative: 29 %
Neutro Abs: 1.4 10*3/uL — ABNORMAL LOW (ref 1.7–7.7)
Neutrophils Relative %: 53 %
Platelet Count: 262 10*3/uL (ref 150–400)
RBC: 3.12 MIL/uL — ABNORMAL LOW (ref 4.22–5.81)
RDW: 18.7 % — ABNORMAL HIGH (ref 11.5–15.5)
WBC Count: 2.7 10*3/uL — ABNORMAL LOW (ref 4.0–10.5)
nRBC: 0 % (ref 0.0–0.2)

## 2021-04-01 LAB — TSH: TSH: 3.196 u[IU]/mL (ref 0.320–4.118)

## 2021-04-01 MED ORDER — SODIUM CHLORIDE 0.9 % IV SOLN: Freq: Once | INTRAVENOUS | Status: AC

## 2021-04-01 MED ORDER — PALONOSETRON HCL INJECTION 0.25 MG/5ML
0.2500 mg | Freq: Once | INTRAVENOUS | Status: AC
  Administered 2021-04-01: 0.25 mg via INTRAVENOUS
  Filled 2021-04-01: qty 5

## 2021-04-01 MED ORDER — SODIUM CHLORIDE 0.9 % IV SOLN
10.0000 mg | Freq: Once | INTRAVENOUS | Status: AC
  Administered 2021-04-01: 10 mg via INTRAVENOUS
  Filled 2021-04-01: qty 10

## 2021-04-01 MED ORDER — SODIUM CHLORIDE 0.9 % IV SOLN
200.0000 mg | Freq: Once | INTRAVENOUS | Status: AC
  Administered 2021-04-01: 200 mg via INTRAVENOUS
  Filled 2021-04-01: qty 8

## 2021-04-01 MED ORDER — SODIUM CHLORIDE 0.9 % IV SOLN
350.0000 mg | Freq: Once | INTRAVENOUS | Status: AC
  Administered 2021-04-01: 350 mg via INTRAVENOUS
  Filled 2021-04-01: qty 35

## 2021-04-01 MED ORDER — SODIUM CHLORIDE 0.9 % IV SOLN
150.0000 mg | Freq: Once | INTRAVENOUS | Status: AC
  Administered 2021-04-01: 150 mg via INTRAVENOUS
  Filled 2021-04-01: qty 150

## 2021-04-01 MED ORDER — SODIUM CHLORIDE 0.9 % IV SOLN
400.0000 mg/m2 | Freq: Once | INTRAVENOUS | Status: AC
  Administered 2021-04-01: 700 mg via INTRAVENOUS
  Filled 2021-04-01: qty 20

## 2021-04-01 NOTE — Patient Instructions (Signed)
[facility]  Discharge Instructions: Thank you for choosing [facility] to provide your oncology and hematology care.   If you have a lab appointment with the [facility], please go directly to the [facility] and check in at the registration area.   Wear comfortable clothing and clothing appropriate for easy access to any Portacath or PICC line.   We strive to give you quality time with your provider. You may need to reschedule your appointment if you arrive late (15 or more minutes).  Arriving late affects you and other patients whose appointments are after yours.  Also, if you miss three or more appointments without notifying the office, you may be dismissed from the clinic at the provider's discretion.      For prescription refill requests, have your pharmacy contact our office and allow 72 hours for refills to be completed.    Today you received the following chemotherapy and/or immunotherapy agents: Keytruda, Alimta, Carboplatin   To help prevent nausea and vomiting after your treatment, we encourage you to take your nausea medication as directed.  BELOW ARE SYMPTOMS THAT SHOULD BE REPORTED IMMEDIATELY: *FEVER GREATER THAN 100.4 F (38 C) OR HIGHER *CHILLS OR SWEATING *NAUSEA AND VOMITING THAT IS NOT CONTROLLED WITH YOUR NAUSEA MEDICATION *UNUSUAL SHORTNESS OF BREATH *UNUSUAL BRUISING OR BLEEDING *URINARY PROBLEMS (pain or burning when urinating, or frequent urination) *BOWEL PROBLEMS (unusual diarrhea, constipation, pain near the anus) TENDERNESS IN MOUTH AND THROAT WITH OR WITHOUT PRESENCE OF ULCERS (sore throat, sores in mouth, or a toothache) UNUSUAL RASH, SWELLING OR PAIN  UNUSUAL VAGINAL DISCHARGE OR ITCHING   Items with * indicate a potential emergency and should be followed up as soon as possible or go to the Emergency Department if any problems should occur.  Please show the CHEMOTHERAPY ALERT CARD or IMMUNOTHERAPY ALERT  CARD at check-in to the Emergency Department and triage nurse.  Should you have questions after your visit or need to cancel or reschedule your appointment, please contact [facility]  Dept: [phone]  and follow the prompts.  Office hours are 8:00 a.m. to 4:30 p.m. Monday - Friday. Please note that voicemails left after 4:00 p.m. may not be returned until the following business day.  We are closed weekends and major holidays. You have access to a nurse at all times for urgent questions. Please call the main number to the clinic Dept: [phone] and follow the prompts.   For any non-urgent questions, you may also contact your provider using MyChart. We now offer e-Visits for anyone 30 and older to request care online for non-urgent symptoms. For details visit [email or web address].   Also download the MyChart app! Go to the app store, search "MyChart", open the app, select [facility], and log in with your MyChart username and password.  Due to Covid, a mask is required upon entering the hospital/clinic. If you do not have a mask, one will be given to you upon arrival. For doctor visits, patients may have 1 support person aged 21 or older with them. For treatment visits, patients cannot have anyone with them due to current Covid guidelines and our immunocompromised population.   Vitamin B12 Injection What is this medication? Vitamin B12 (VAHY tuh min B12) prevents and treats low vitamin B12 levels in your body. It is used in people who do not get enough vitamin B12 from their diet or when their digestive tract does not absorb enough. Vitamin B12 plays  an important role in maintaining the health of your nervous system and red blood cells. This medicine may be used for other purposes; ask your health care provider or pharmacist if you have questions. COMMON BRAND NAME(S): B-12 Compliance Kit, B-12 Injection Kit, Cyomin, Dodex, LA-12, Nutri-Twelve, Physicians EZ Use  B-12, Primabalt What should I tell my care team before I take this medication? They need to know if you have any of these conditions: Kidney disease Leber's disease Megaloblastic anemia An unusual or allergic reaction to cyanocobalamin, cobalt, other medications, foods, dyes, or preservatives Pregnant or trying to get pregnant Breast-feeding How should I use this medication? This medication is injected into a muscle or deeply under the skin. It is usually given in a clinic or care team's office. However, your care team may teach you how to inject yourself. Follow all instructions. Talk to your care team about the use of this medication in children. Special care may be needed. Overdosage: If you think you have taken too much of this medicine contact a poison control center or emergency room at once. NOTE: This medicine is only for you. Do not share this medicine with others. What if I miss a dose? If you are given your dose at a clinic or care team's office, call to reschedule your appointment. If you give your own injections, and you miss a dose, take it as soon as you can. If it is almost time for your next dose, take only that dose. Do not take double or extra doses. What may interact with this medication? Colchicine Heavy alcohol intake This list may not describe all possible interactions. Give your health care provider a list of all the medicines, herbs, non-prescription drugs, or dietary supplements you use. Also tell them if you smoke, drink alcohol, or use illegal drugs. Some items may interact with your medicine. What should I watch for while using this medication? Visit your care team regularly. You may need blood work done while you are taking this medication. You may need to follow a special diet. Talk to your care team. Limit your alcohol intake and avoid smoking to get the best benefit. What side effects may I notice from receiving this medication? Side effects that you should  report to your care team as soon as possible: Allergic reactions-skin rash, itching, hives, swelling of the face, lips, tongue, or throat Swelling of the ankles, hands, or feet Trouble breathing Side effects that usually do not require medical attention (report to your care team if they continue or are bothersome): Diarrhea This list may not describe all possible side effects. Call your doctor for medical advice about side effects. You may report side effects to FDA at 1-800-FDA-1088. Where should I keep my medication? Keep out of the reach of children. Store at room temperature between 15 and 30 degrees C (59 and 85 degrees F). Protect from light. Throw away any unused medication after the expiration date. NOTE: This sheet is a summary. It may not cover all possible information. If you have questions about this medicine, talk to your doctor, pharmacist, or health care provider.  2022 Elsevier/Gold Standard (2020-07-14 11:47:06)

## 2021-04-01 NOTE — Progress Notes (Signed)
Nutrition Follow-up:  Patient receiving carboplatin/pemetrexed/keytruda for metastatic lung cancer.   Met with patient and wife during infusion. Patient sitting up in bed eating a sandwich and cheese puffs at visit. He reports his appetite has greatly improved. Patient reports he did not start appetite stimulant. He is eating 3 meals/daily and usually has a big bowl of ice cream for dessert. Patient is not drinking nutrition supplements currently. Patient recalls salmon, broccoli, potatoes for supper last night. He has been eating ribs, BBQ, fish, vegetables, sandwiches, eggs, cereal, chips, candy. Patient denies nausea, vomiting, diarrhea. Patient reports improved constipation, takes Miralax as needed.    Medications: reviewed   Labs: BUN 6  Anthropometrics: Weight 134 lb today increased from 133 lb 4.8 oz on 10/4 and 132 lb 0.9 oz on 9/27  9/28 - 128 lb 14.4 oz 8/24 - 132 lb 12.8 oz   MALNUTRITION DIAGNOSIS: Severe malnutrition ongoing   INTERVENTION:  Continue strategies for increasing calories and protein Encouraged high calorie, high protein foods to promote weight gain Continued support and encouragement provided    MONITORING, EVALUATION, GOAL: weight trends, intake   NEXT VISIT: Wednesday November 16 during infusion

## 2021-04-01 NOTE — Progress Notes (Signed)
Per Cassie, PA, ok to treat with ANC 1.4.

## 2021-04-02 ENCOUNTER — Other Ambulatory Visit: Payer: Medicare Other

## 2021-04-02 ENCOUNTER — Encounter: Payer: Self-pay | Admitting: Internal Medicine

## 2021-04-08 ENCOUNTER — Other Ambulatory Visit: Payer: Self-pay | Admitting: Physician Assistant

## 2021-04-08 DIAGNOSIS — G893 Neoplasm related pain (acute) (chronic): Secondary | ICD-10-CM

## 2021-04-08 MED ORDER — MORPHINE SULFATE ER 30 MG PO TBCR: 30.0000 mg | EXTENDED_RELEASE_TABLET | Freq: Three times a day (TID) | ORAL | 0 refills | Status: DC | PRN

## 2021-04-09 ENCOUNTER — Inpatient Hospital Stay: Payer: Medicare Other | Attending: Physician Assistant

## 2021-04-09 ENCOUNTER — Other Ambulatory Visit: Payer: Self-pay

## 2021-04-09 DIAGNOSIS — D649 Anemia, unspecified: Secondary | ICD-10-CM

## 2021-04-09 DIAGNOSIS — Z5111 Encounter for antineoplastic chemotherapy: Secondary | ICD-10-CM | POA: Insufficient documentation

## 2021-04-09 DIAGNOSIS — Z79899 Other long term (current) drug therapy: Secondary | ICD-10-CM | POA: Insufficient documentation

## 2021-04-09 DIAGNOSIS — C787 Secondary malignant neoplasm of liver and intrahepatic bile duct: Secondary | ICD-10-CM | POA: Diagnosis not present

## 2021-04-09 DIAGNOSIS — C3411 Malignant neoplasm of upper lobe, right bronchus or lung: Secondary | ICD-10-CM | POA: Insufficient documentation

## 2021-04-09 DIAGNOSIS — C3491 Malignant neoplasm of unspecified part of right bronchus or lung: Secondary | ICD-10-CM

## 2021-04-09 LAB — CBC WITH DIFFERENTIAL (CANCER CENTER ONLY)
Abs Immature Granulocytes: 0.01 10*3/uL (ref 0.00–0.07)
Basophils Absolute: 0 10*3/uL (ref 0.0–0.1)
Basophils Relative: 1 %
Eosinophils Absolute: 0 10*3/uL (ref 0.0–0.5)
Eosinophils Relative: 0 %
HCT: 25.4 % — ABNORMAL LOW (ref 39.0–52.0)
Hemoglobin: 8.7 g/dL — ABNORMAL LOW (ref 13.0–17.0)
Immature Granulocytes: 0 %
Lymphocytes Relative: 14 %
Lymphs Abs: 0.3 10*3/uL — ABNORMAL LOW (ref 0.7–4.0)
MCH: 31 pg (ref 26.0–34.0)
MCHC: 34.3 g/dL (ref 30.0–36.0)
MCV: 90.4 fL (ref 80.0–100.0)
Monocytes Absolute: 0.2 10*3/uL (ref 0.1–1.0)
Monocytes Relative: 8 %
Neutro Abs: 1.8 10*3/uL (ref 1.7–7.7)
Neutrophils Relative %: 77 %
Platelet Count: 126 10*3/uL — ABNORMAL LOW (ref 150–400)
RBC: 2.81 MIL/uL — ABNORMAL LOW (ref 4.22–5.81)
RDW: 17.3 % — ABNORMAL HIGH (ref 11.5–15.5)
WBC Count: 2.3 10*3/uL — ABNORMAL LOW (ref 4.0–10.5)
nRBC: 0 % (ref 0.0–0.2)

## 2021-04-09 LAB — CMP (CANCER CENTER ONLY)
ALT: 6 U/L (ref 0–44)
AST: 16 U/L (ref 15–41)
Albumin: 2.8 g/dL — ABNORMAL LOW (ref 3.5–5.0)
Alkaline Phosphatase: 98 U/L (ref 38–126)
Anion gap: 10 (ref 5–15)
BUN: 13 mg/dL (ref 8–23)
CO2: 23 mmol/L (ref 22–32)
Calcium: 8.6 mg/dL — ABNORMAL LOW (ref 8.9–10.3)
Chloride: 100 mmol/L (ref 98–111)
Creatinine: 0.59 mg/dL — ABNORMAL LOW (ref 0.61–1.24)
GFR, Estimated: >60 mL/min (ref >60)
Glucose, Bld: 86 mg/dL (ref 70–99)
Potassium: 4.1 mmol/L (ref 3.5–5.1)
Sodium: 133 mmol/L — ABNORMAL LOW (ref 135–145)
Total Bilirubin: 0.8 mg/dL (ref 0.3–1.2)
Total Protein: 6.3 g/dL — ABNORMAL LOW (ref 6.5–8.1)

## 2021-04-09 LAB — SAMPLE TO BLOOD BANK

## 2021-04-15 ENCOUNTER — Ambulatory Visit: Payer: Medicare Other

## 2021-04-15 ENCOUNTER — Telehealth: Payer: Self-pay

## 2021-04-15 ENCOUNTER — Ambulatory Visit: Payer: Medicare Other | Admitting: Internal Medicine

## 2021-04-15 ENCOUNTER — Other Ambulatory Visit: Payer: Self-pay

## 2021-04-15 ENCOUNTER — Inpatient Hospital Stay: Payer: Medicare Other

## 2021-04-15 DIAGNOSIS — Z5111 Encounter for antineoplastic chemotherapy: Secondary | ICD-10-CM | POA: Diagnosis not present

## 2021-04-15 DIAGNOSIS — C3491 Malignant neoplasm of unspecified part of right bronchus or lung: Secondary | ICD-10-CM

## 2021-04-15 DIAGNOSIS — D649 Anemia, unspecified: Secondary | ICD-10-CM

## 2021-04-15 LAB — CBC WITH DIFFERENTIAL (CANCER CENTER ONLY)
Abs Immature Granulocytes: 0.02 10*3/uL (ref 0.00–0.07)
Basophils Absolute: 0 10*3/uL (ref 0.0–0.1)
Basophils Relative: 0 %
Eosinophils Absolute: 0 10*3/uL (ref 0.0–0.5)
Eosinophils Relative: 0 %
HCT: 21.1 % — ABNORMAL LOW (ref 39.0–52.0)
Hemoglobin: 7.3 g/dL — ABNORMAL LOW (ref 13.0–17.0)
Immature Granulocytes: 1 %
Lymphocytes Relative: 13 %
Lymphs Abs: 0.4 10*3/uL — ABNORMAL LOW (ref 0.7–4.0)
MCH: 31.5 pg (ref 26.0–34.0)
MCHC: 34.6 g/dL (ref 30.0–36.0)
MCV: 90.9 fL (ref 80.0–100.0)
Monocytes Absolute: 0.4 10*3/uL (ref 0.1–1.0)
Monocytes Relative: 15 %
Neutro Abs: 2.1 10*3/uL (ref 1.7–7.7)
Neutrophils Relative %: 71 %
Platelet Count: 85 10*3/uL — ABNORMAL LOW (ref 150–400)
RBC: 2.32 MIL/uL — ABNORMAL LOW (ref 4.22–5.81)
RDW: 17 % — ABNORMAL HIGH (ref 11.5–15.5)
Smear Review: NORMAL
WBC Count: 3 10*3/uL — ABNORMAL LOW (ref 4.0–10.5)
nRBC: 0 % (ref 0.0–0.2)

## 2021-04-15 LAB — CMP (CANCER CENTER ONLY)
ALT: 5 U/L (ref 0–44)
AST: 14 U/L — ABNORMAL LOW (ref 15–41)
Albumin: 2.9 g/dL — ABNORMAL LOW (ref 3.5–5.0)
Alkaline Phosphatase: 101 U/L (ref 38–126)
Anion gap: 9 (ref 5–15)
BUN: 9 mg/dL (ref 8–23)
CO2: 23 mmol/L (ref 22–32)
Calcium: 8.4 mg/dL — ABNORMAL LOW (ref 8.9–10.3)
Chloride: 101 mmol/L (ref 98–111)
Creatinine: 0.66 mg/dL (ref 0.61–1.24)
GFR, Estimated: >60 mL/min (ref >60)
Glucose, Bld: 96 mg/dL (ref 70–99)
Potassium: 3.8 mmol/L (ref 3.5–5.1)
Sodium: 133 mmol/L — ABNORMAL LOW (ref 135–145)
Total Bilirubin: 0.4 mg/dL (ref 0.3–1.2)
Total Protein: 6.4 g/dL — ABNORMAL LOW (ref 6.5–8.1)

## 2021-04-15 LAB — SAMPLE TO BLOOD BANK

## 2021-04-16 ENCOUNTER — Other Ambulatory Visit: Payer: Self-pay

## 2021-04-16 ENCOUNTER — Ambulatory Visit: Payer: Medicare Other

## 2021-04-16 ENCOUNTER — Inpatient Hospital Stay (HOSPITAL_BASED_OUTPATIENT_CLINIC_OR_DEPARTMENT_OTHER): Payer: Medicare Other | Admitting: Internal Medicine

## 2021-04-16 VITALS — BP 121/72 | HR 54 | Temp 97.0°F | Resp 19 | Ht 74.0 in | Wt 132.1 lb

## 2021-04-16 DIAGNOSIS — C3491 Malignant neoplasm of unspecified part of right bronchus or lung: Secondary | ICD-10-CM | POA: Diagnosis not present

## 2021-04-16 DIAGNOSIS — Z5111 Encounter for antineoplastic chemotherapy: Secondary | ICD-10-CM

## 2021-04-16 DIAGNOSIS — D649 Anemia, unspecified: Secondary | ICD-10-CM

## 2021-04-16 DIAGNOSIS — Z5112 Encounter for antineoplastic immunotherapy: Secondary | ICD-10-CM

## 2021-04-16 LAB — PREPARE RBC (CROSSMATCH)

## 2021-04-16 MED ORDER — DIPHENHYDRAMINE HCL 25 MG PO CAPS
25.0000 mg | ORAL_CAPSULE | Freq: Once | ORAL | Status: AC
  Administered 2021-04-16: 25 mg via ORAL

## 2021-04-16 MED ORDER — ACETAMINOPHEN 325 MG PO TABS
650.0000 mg | ORAL_TABLET | Freq: Once | ORAL | Status: AC
  Administered 2021-04-16: 650 mg via ORAL

## 2021-04-16 MED ORDER — SODIUM CHLORIDE 0.9% IV SOLUTION
250.0000 mL | Freq: Once | INTRAVENOUS | Status: AC
  Administered 2021-04-16: 250 mL via INTRAVENOUS

## 2021-04-16 NOTE — Patient Instructions (Signed)
Blood Transfusion, Adult, Care After This sheet gives you information about how to care for yourself after your procedure. Your doctor may also give you more specific instructions. If you have problems or questions, contact your doctor. What can I expect after the procedure? After the procedure, it is common to have: Bruising and soreness at the IV site. A headache. Follow these instructions at home: Insertion site care   Follow instructions from your doctor about how to take care of your insertion site. This is where an IV tube was put into your vein. Make sure you: Wash your hands with soap and water before and after you change your bandage (dressing). If you cannot use soap and water, use hand sanitizer. Change your bandage as told by your doctor. Check your insertion site every day for signs of infection. Check for: Redness, swelling, or pain. Bleeding from the site. Warmth. Pus or a bad smell. General instructions Take over-the-counter and prescription medicines only as told by your doctor. Rest as told by your doctor. Go back to your normal activities as told by your doctor. Keep all follow-up visits as told by your doctor. This is important. Contact a doctor if: You have itching or red, swollen areas of skin (hives). You feel worried or nervous (anxious). You feel weak after doing your normal activities. You have redness, swelling, warmth, or pain around the insertion site. You have blood coming from the insertion site, and the blood does not stop with pressure. You have pus or a bad smell coming from the insertion site. Get help right away if: You have signs of a serious reaction. This may be coming from an allergy or the body's defense system (immune system). Signs include: Trouble breathing or shortness of breath. Swelling of the face or feeling warm (flushed). Fever or chills. Head, chest, or back pain. Dark pee (urine) or blood in the pee. Widespread rash. Fast  heartbeat. Feeling dizzy or light-headed. You may receive your blood transfusion in an outpatient setting. If so, you will be told whom to contact to report any reactions. These symptoms may be an emergency. Do not wait to see if the symptoms will go away. Get medical help right away. Call your local emergency services (911 in the [place] Do not drive yourself to the hospital. Summary Bruising and soreness at the IV site are common. Check your insertion site every day for signs of infection. Rest as told by your doctor. Go back to your normal activities as told by your doctor. Get help right away if you have signs of a serious reaction. This information is not intended to replace advice given to you by your health care provider. Make sure you discuss any questions you have with your health care provider. Document Revised: 09/18/2020 Document Reviewed: 11/16/2018 [employer] Patient Education  2022 [employer]  

## 2021-04-16 NOTE — Progress Notes (Signed)
Christopher Harding [phone]   [phone]  OFFICE PROGRESS NOTE  [place]oir[place], Dibas, MD [address] [place] [address]  DIAGNOSIS:  Metastatic non-small cell lung cancer initially diagnosed as stage IIIA/B (T3, N1/N2, M0) non-small cell lung cancer, adenocarcinoma.  He presented with a right upper lobe lung mass with direct extension across the major fissure into the superior segment of the right lower lobe and ipsilateral hilar lymphadenopathy.  There is questionable subcarinal lymphadenopathy versus esophageal hypermetabolism this was evaluated by gastroenterology with upper endoscopy on 01/30/2020 and there was no mass lesion or malignancy in that area. The patient was diagnosed in May 2021.    Biomarker Findings Microsatellite status - MS-Stable Tumor Mutational Burden - 9 Muts/Mb Genomic Findings For a complete list of the genes assayed, please refer to the Appendix. KRAS G12C CDKN2A/B [id number] S12* NKX2-1 amplification - equivocal? PMS2 R3Q TP53 P3f*38 7 Disease relevant genes with no reportable alterations: ALK, BRAF, EGFR, ERBB2, MET, RET, ROS1   PDL1: 70%  REPEAT MOLECULAR STUDIES BY CARIS ON THE LIVER BIOPSY:  Results with Therapy Associations BIOMARKER METHOD ANALYTE RESULT THERAPY ASSOCIATION BIOMARKER LEVEL* . alectinib, ceritinib, crizotinib, lorlatinib Level 1 . IHC Protein Negative  0 brigatinib Level 2 . ALK Seq RNA-Tumor Fusion Not Detected LACK OF BENEFIT alectinib, brigatinib, ceritinib, crizotinib, lorlatinib Level 2 .BRAF Seq DNA-Tumor Mutation Not Detected LACK OF BENEFIT dabrafenib and trametinib combination therapy, vemurafenib Level 2 .EGFR Seq DNA-Tumor Mutation Not Detected LACK OF BENEFIT erlotinib, gefitinib Level 2 KRAS Seq DNA-Tumor Mutation Not Detected LACK OF BENEFIT sotorasib Level 2 .RET Seq RNA-Tumor Fusion Not Detected LACK OF BENEFIT pralsetinib, selpercatinib Level  2 .ROS1 Seq RNA-Tumor Fusion Not Detected LACK OF BENEFIT ceritinib, crizotinib, entrectinib, lorlatinib Level 2 . CNA-Seq DNA-Tumor Amplification Not Detected .MET Seq RNA-Tumor Variant Transcript Not Detected LACK OFBENEFIT crizotinib Level 3  PRIOR THERAPY:  1) Weekly concurrent chemoradiation with carboplatin for an AUC of 2 and paclitaxel 45 mg/m.  First dose expected on 11/26/2019. Status post 7 cycles.   Last dose was given 01/07/2020 with partial response. 2) Consolidation treatment with immunotherapy with Imfinzi 1500 mg IV every 4 weeks.  First dose 02/19/2020. Status post 6 cycles.  Discontinued secondary to disease progression. 3) Lumakras (Sotorasib) 960 mg p.o. daily.  First dose started December 12, 2020.  Status post 1 months of treatment discontinued secondary to disease progression in the liver    CURRENT THERAPY: Systemic chemotherapy with carboplatin for AUC of 5, Alimta 500 Mg/M2 and Keytruda 200 Mg IV every 3 weeks.  First dose 01/22/2021.  Status post 4 cycles.  Starting from cycle #5 he will be on maintenance treatment with Alimta and Keytruda every 3 weeks.  INTERVAL HISTORY: Christopher [id number]. male returns to the clinic today for follow-up visit accompanied by his wife.  The patient continues to complain of increasing fatigue weakness as well as confusion.  He denied having any current chest pain, shortness of breath, cough or hemoptysis.  He denied having any fever or chills.  He has no nausea, vomiting, diarrhea or constipation.  He has no headache or visual changes.  The patient had CT scan of the abdomen pelvis on March 27, 2021 that showed improvement of his disease.  He is here today for evaluation and repeat blood work showed further decline in his hemoglobin and hematocrit consistent with the chemotherapy-induced anemia.  The patient and his wife had several questions and  they are here for discussion of his prognosis and treatment options.  MEDICAL HISTORY: Past  Medical History:  Diagnosis Date   A-fib [facility])    s/p DCCV 12/07/18   Cancer [facility])    Depression    situational   Dysrhythmia    Hypertension    Persistent atrial fibrillation ([place]) 11/23/2018   Persistent atrial fibrillation   Tobacco abuse 11/23/2018    ALLERGIES:  has No Known Allergies.  MEDICATIONS:  Current Outpatient Medications  Medication Sig Dispense Refill   ALPRAZolam (XANAX) 0.25 MG tablet Take 0.25 mg by mouth 2 (two) times daily as needed.     folic acid (FOLVITE) 1 MG tablet Take 1 tablet (1 mg total) by mouth daily. 30 tablet 4   megestrol (MEGACE ES) 625 MG/5ML suspension Take 5 mLs (625 mg total) by mouth daily. (Patient not taking: Reported on 03/01/2021) 150 mL 0   metoprolol tartrate (LOPRESSOR) 50 MG tablet Take 50 mg by mouth 2 (two) times daily with a meal.     mirtazapine (REMERON) 30 MG tablet TAKE 1 TABLET BY MOUTH AT BEDTIME. 90 tablet 1   morphine (MS CONTIN) 30 MG 12 hr tablet Take 1 tablet (30 mg total) by mouth 3 (three) times daily as needed for pain. 60 tablet 0   oxyCODONE (OXY IR/ROXICODONE) 5 MG immediate release tablet Take 1 tablet (5 mg total) by mouth 3 (three) times daily as needed for severe pain. (Patient not taking: Reported on 03/01/2021) 30 tablet 0   polyethylene glycol (MIRALAX / GLYCOLAX) 17 g packet Take 17 g by mouth daily.     prochlorperazine (COMPAZINE) 10 MG tablet Take 1 tablet (10 mg total) by mouth every 6 (six) hours as needed for nausea or vomiting. 30 tablet 0   XARELTO 20 MG TABS tablet Take 20 mg by mouth daily.     No current facility-administered medications for this visit.    SURGICAL HISTORY:  Past Surgical History:  Procedure Laterality Date   CARDIOVERSION N/A 12/07/2018   Procedure: CARDIOVERSION;  Surgeon: Josue Hector, MD;  Location: [place];  Service: Cardiovascular;  Laterality: N/A;   FINE NEEDLE ASPIRATION  10/30/2019   Procedure: FINE NEEDLE ASPIRATION (FNA) LINEAR;  Surgeon: Candee Furbish, MD;   Location: [facility] ENDOSCOPY;  Service: Pulmonary;;   INGUINAL HERNIA REPAIR Right 01/25/2019   Procedure: RIGHT INGUINAL HERNIA REPAIR WITH MESH;  Surgeon: Coralie Keens, MD;  Location: [place];  Service: General;  Laterality: Right;  TAP BLOCK   INSERTION OF MESH Right 01/25/2019   Procedure: Insertion Of Mesh;  Surgeon: Coralie Keens, MD;  Location: [place];  Service: General;  Laterality: Right;   VIDEO BRONCHOSCOPY WITH ENDOBRONCHIAL ULTRASOUND N/A 10/30/2019   Procedure: VIDEO BRONCHOSCOPY WITH ENDOBRONCHIAL ULTRASOUND;  Surgeon: Candee Furbish, MD;  Location: [facility] ENDOSCOPY;  Service: Pulmonary;  Laterality: N/A;    REVIEW OF SYSTEMS:  Constitutional: positive for fatigue Eyes: negative Ears, nose, mouth, throat, and face: negative Respiratory: negative Cardiovascular: negative Gastrointestinal: negative Genitourinary:negative Integument/breast: negative Hematologic/lymphatic: negative Musculoskeletal:negative Neurological: negative Behavioral/Psych: negative Endocrine: negative Allergic/Immunologic: negative   PHYSICAL EXAMINATION: General appearance: alert, cooperative, fatigued, and no distress Head: Normocephalic, without obvious abnormality, atraumatic Neck: no adenopathy, no JVD, supple, symmetrical, trachea midline, and thyroid not enlarged, symmetric, no tenderness/mass/nodules Lymph nodes: Cervical, supraclavicular, and axillary nodes normal. Resp: clear to auscultation bilaterally Back: symmetric, no curvature. ROM normal. No CVA tenderness. Cardio: regular rate and rhythm, S1, S2 normal, no murmur, click, rub or gallop  GI: soft, non-tender; bowel sounds normal; no masses,  no organomegaly Extremities: extremities normal, atraumatic, no cyanosis or edema Neurologic: Alert and oriented X 3, normal strength and tone. Normal symmetric reflexes. Normal coordination and gait  ECOG PERFORMANCE STATUS: 1 - Symptomatic but completely ambulatory  Blood pressure 121/72, pulse  (!) 54, temperature (!) 97 F (36.1 C), temperature source Tympanic, resp. rate 19, height _0  (1.88 m), weight 132 lb 1.6 oz (59.9 kg), SpO2 100 %.  LABORATORY DATA: Lab Results  Component Value Date   WBC 3.0 (L) 04/15/2021   HGB 7.3 (L) 04/15/2021   HCT 21.1 (L) 04/15/2021   MCV 90.9 04/15/2021   PLT 85 (L) 04/15/2021      Chemistry      Component Value Date/Time   NA 133 (L) 04/15/2021 0928   NA 129 (L) 11/24/2018 1520   K 3.8 04/15/2021 0928   CL 101 04/15/2021 0928   CO2 23 04/15/2021 0928   BUN 9 04/15/2021 0928   BUN 8 11/24/2018 1520   CREATININE 0.66 04/15/2021 0928      Component Value Date/Time   CALCIUM 8.4 (L) 04/15/2021 0928   ALKPHOS 101 04/15/2021 0928   AST 14 (L) 04/15/2021 0928   ALT 5 04/15/2021 0928   BILITOT 0.4 04/15/2021 0928       RADIOGRAPHIC STUDIES: CT Abdomen Pelvis W Contrast  Result Date: 03/30/2021 CLINICAL DATA:  Fall non-small cell lung cancer. EXAM: CT ABDOMEN AND PELVIS WITH CONTRAST TECHNIQUE: Multidetector CT imaging of the abdomen and pelvis was performed using the standard protocol following bolus administration of intravenous contrast. CONTRAST:  67m OMNIPAQUE IOHEXOL 350 MG/ML SOLN COMPARISON:  CT 01/12/2021 FINDINGS: Lower chest: Lung bases are clear. Small to moderate RIGHT pleural effusion increased from prior Hepatobiliary: Large infiltrative mass in the LEFT hepatic lobe measures 7.4 x 4.5 cm compared with 8.9 by 6.1 cm. Mild biliary duct dilatation in the lateral segment of the LEFT hepatic lobe related to the mass. No change from prior. Several satellite lesions adjacent to the dominant mass. For example lesion central left hepatic lobe measuring 13 mm (image 15/2) compared to 16 mm no new lesions are present. Lesion in the inferior tip of the RIGHT hepatic lobe measuring 20 mm (image 30/5/coronal) compares to 20 mm. Periportal lymph nodes described on comparison damage decreased. For example lymph node in the gastrohepatic  ligament measuring 9 mm short axis (image 21/2) decreased from 19 mm. No new adenopathy. Pancreas: Pancreas is normal. No ductal dilatation. No pancreatic inflammation. Spleen: Normal spleen Adrenals/urinary tract: Adrenal glands and kidneys are normal. The ureters and bladder normal. Stomach/Bowel: Stomach, small bowel, and cecum are normal. The colon and rectosigmoid colon are normal. Moderate volume stool throughout the colon. Vascular/Lymphatic: Abdominal aorta is normal caliber. No periportal or retroperitoneal adenopathy. No pelvic adenopathy. Reproductive: Prostate unremarkable Other: No free fluid. Musculoskeletal: Sclerotic endplate changes with subchondral lucencies at L4-L5 are unchanged in favored chronic degenerative change. IMPRESSION: 1. Interval increase in small to moderate RIGHT pleural effusion. 2. Interval decrease in size of dominant mass in the LEFT hepatic lobe. No new lesions are present. 3. Interval decrease in size of periportal lymph nodes. 4. No evidence of metastatic carcinomatosis or peritoneal disease. 5. Moderate volume stool throughout the colon suggest constipation. Electronically Signed   By: SSuzy BouchardM.D.   On: 03/30/2021 19:05     ASSESSMENT AND PLAN: This is a very pleasant 70years old white male with metastatic non-small cell lung  cancer initially diagnosed as stage IIIA/B (T3, N1/N2, M0) non-small cell lung cancer, adenocarcinoma presented with right upper lobe lung mass with direct extension across the major fissure into the superior segment of the right lower lobe and ipsilateral hilar lymphadenopathy as well as questionable subcarinal lymphadenopathy.  The patient had evidence for disease metastasis confirmed in June 2022. The patient completed a course of concurrent chemoradiation with weekly carboplatin and paclitaxel status post 7 cycles.  He tolerated this treatment well with no concerning adverse effect except for mild sore throat and mouth. The patient  had partial response to this treatment. The patient is currently undergoing a course of consolidation treatment with immunotherapy with Imfinzi 1500 mg IV every 4 weeks status post 6 cycles.  He has been tolerating his treatment well with no concerning adverse effects. His last CT scan of the chest several months ago showed no concerning findings for disease progression in the chest but the patient has a highly suspicious liver lesion concerning for metastatic disease.  The patient underwent ultrasound-guided core biopsy of this lesion. The final [facility] to [facility] showed no evidence of malignancy and there was suspicious inflammatory process of biliary origin. He has been on observation and repeat CT scan of the chest, abdomen pelvis performed few weeks ago showed further increase in the size of the suspicious liver metastasis.  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 He has no interest in systemic chemotherapy.  The patient is started treatment with Lumakras (Sotorasib) 960 mg p.o. daily 4 days ago and has been tolerating this treatment well. He continues to have increasing fatigue and weakness as well as lack of appetite and weight loss.  He also has significant pain in the epigastric area. 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 The patient started treatment with Lumakras (Sotorasib) 960 mg p.o. daily status post 1  months.  He tolerated the treatment well except for fatigue and occasional diarrhea. He had repeat CT scan of the chest, abdomen pelvis performed recently. I personally and independently reviewed the scan images and discussed the result and showed the images to the patient and his wife. Unfortunately his scan showed further disease progression in the liver with enlargement of the dominant mass and development of new lesions in the liver.  His treatment with Lumakras (Sotorasib) was discontinued secondary to disease progression. The patient is currently undergoing palliative systemic chemotherapy with carboplatin for AUC of 5, Alimta 500 Mg/M2 and Keytruda 200 Mg IV every 3 weeks.  Status post 4 cycles.  He has been tolerating his treatment well except for the fatigue from the chemotherapy-induced anemia. He had CT scan of the abdomen pelvis on March 30, 2021 that showed further improvement of his disease especially in the liver. I recommended for the patient to continue his current treatment with maintenance Alimta and Keytruda and he will start cycle #5 next week.  I also discussed with the patient and his wife the option of palliative care and hospice referral but  they are still interested in treatment. For the chemotherapy-induced anemia, will arrange for the patient to receive 2 units of PRBCs transfusion today. For the lack of appetite and weight loss, he will continue on Megace ES 625 mg p.o. daily. For the dysphagia, he is followed by gastroenterology and receives dilatation of the esophagus as needed.. For the constipation, the patient will continue on stool softener and MiraLAX on as-needed basis. The patient was advised to call immediately if he has any other concerning symptoms in the interval. The patient voices understanding of current disease status and treatment options and is in agreement with the current care plan.  All questions were answered. The patient knows to call the clinic  with any problems, questions or concerns. We can certainly see the patient much sooner if necessary.  Disclaimer: This note was dictated with voice recognition software. Similar sounding words can inadvertently be transcribed and may not be corrected upon review.

## 2021-04-17 LAB — TYPE AND SCREEN
ABO/RH(D): A NEG
Antibody Screen: NEGATIVE
Unit division: 0
Unit division: 0

## 2021-04-17 LAB — BPAM RBC
Blood Product Expiration Date: 202212032359
Blood Product Expiration Date: 202212102359
ISSUE DATE / TIME: 202211101229
ISSUE DATE / TIME: 202211101229
Unit Type and Rh: 600
Unit Type and Rh: 600

## 2021-04-19 NOTE — Progress Notes (Signed)
[facility] OFFICE PROGRESS NOTE  Koirala, Dibas, MD [address] [address]  DIAGNOSIS: Metastatic non-small cell lung cancer initially diagnosed as stage IIIA/B (T3, N1/N2, M0) non-small cell lung cancer, adenocarcinoma.  He presented with a right upper lobe lung mass with direct extension across the major fissure into the superior segment of the right lower lobe and ipsilateral hilar lymphadenopathy.  There is questionable subcarinal lymphadenopathy versus esophageal hypermetabolism this was evaluated by gastroenterology with upper endoscopy on 01/30/2020 and there was no mass lesion or malignancy in that area. The patient was diagnosed in May 2021.     Biomarker Findings Microsatellite status - MS-Stable Tumor Mutational Burden - 9 Muts/Mb Genomic Findings For a complete list of the genes assayed, please refer to the Appendix. KRAS G12C CDKN2A/B p16INK4a S12* NKX2-1 amplification - equivocal? PMS2 R3Q TP53 P43f*38 7 Disease relevant genes with no reportable alterations: ALK, BRAF, EGFR, ERBB2, MET, RET, ROS1   PDL1: 70%  REPEAT MOLECULAR STUDIES BY [facility] ON THE LIVER BIOPSY:  Results with Therapy Associations BIOMARKER METHOD ANALYTE RESULT THERAPY ASSOCIATION BIOMARKER LEVEL* . alectinib, ceritinib, crizotinib, lorlatinib Level 1 . IHC Protein Negative  0 brigatinib Level 2 . ALK Seq RNA-Tumor Fusion Not Detected LACK OF BENEFIT alectinib, brigatinib, ceritinib, crizotinib, lorlatinib Level 2 .BRAF Seq DNA-Tumor Mutation Not Detected LACK OF BENEFIT dabrafenib and trametinib combination therapy, vemurafenib Level 2 .EGFR Seq DNA-Tumor Mutation Not Detected LACK OF BENEFIT erlotinib, gefitinib Level 2 KRAS Seq DNA-Tumor Mutation Not Detected LACK OF BENEFIT sotorasib Level 2 .RET Seq RNA-Tumor Fusion Not Detected LACK OF BENEFIT pralsetinib, selpercatinib Level 2 .ROS1 Seq RNA-Tumor Fusion Not Detected LACK OF BENEFIT  ceritinib, crizotinib, entrectinib, lorlatinib Level 2 . CNA-Seq DNA-Tumor Amplification Not Detected .MET Seq RNA-Tumor Variant Transcript Not Detected LACK OFBENEFIT crizotinib Level 3    PRIOR THERAPY: 1) Weekly concurrent chemoradiation with carboplatin for an AUC of 2 and paclitaxel 45 mg/m.  First dose expected on 11/26/2019. Status post 7 cycles.   Last dose was given 01/07/2020 with partial response. 2) Consolidation treatment with immunotherapy with Imfinzi 1500 mg IV every 4 weeks.  First dose 02/19/2020. Status post 6 cycles.  Discontinued secondary to disease progression. 3) Lumakras (Sotorasib) 960 mg p.o. daily.  First dose started December 12, 2020.  Status post 1 months of treatment discontinued secondary to disease progression in the liver  CURRENT THERAPY: Systemic chemotherapy with carboplatin for AUC of 5, Alimta 500 Mg/M2 and Keytruda 200 Mg IV every 3 weeks.  First dose 01/22/2021.  Status post 4 cycles. Starting from cycle #5, the patient will be on maintenance alimta and [place]   INTERVAL HISTORY: RLEGACY LACIVITA70y.o. male returns to the clinic today for a follow-up visit accompanied by his wife. He is currently undergoing palliative systemic chemotherapy and he has been having a challenging time with treatment.  They actually met with hospice recently to consider transitioning to hospice care. They met with Dr. MJulien Nordmannagain last week to discuss prognosis. Dr. MJulien Nordmanndiscussed his options, including palliative/hospice care. The patient and his wife decided to proceed with treatment.   The patient did get a blood transfusion last week with 2 units of blood. He noticed some improvement in his generalized weakness and fatigue with blood, although he continues to have fatigue and generalized weakness.  For pain, he takes MS contin 30 mg every 8-12 hours. He denies any fever, chills, or night sweats. He didn't start megace for his  decreased appetite due to the side effect profile  and concern for blood clot. His appetite has improved and they stated he eats well. He is scheduled to see a member of the nutritionist team while in the infusion room today. His weight is stable. He denies any nausea or vomiting.  He denies diarrhea or constipation recently.  He has a mild cough.  Denies any hemoptysis or chest pain.  He reports dyspnea on exertion.  He feels like sometimes his breathing is more labored in the morning but notices that Xanax helps.  He has been having issues with anxiety and panic attacks which is managed by his PCP.  He denies any headache or visual changes.  Denies rashes or skin changes. Denies recent alcohol or tylenol use. Denies abdominal pain. He is here for evaluation before starting maintenance.  He is here today for evaluation to review his scan results before considering starting cycle #5.     MEDICAL HISTORY: Past Medical History:  Diagnosis Date   A-fib [facility])    s/p DCCV 12/07/18   Cancer [facility])    Depression    situational   Dysrhythmia    Hypertension    Persistent atrial fibrillation ([place]) 11/23/2018   Persistent atrial fibrillation   Tobacco abuse 11/23/2018    ALLERGIES:  has No Known Allergies.  MEDICATIONS:  Current Outpatient Medications  Medication Sig Dispense Refill   lidocaine-prilocaine (EMLA) cream Apply 1 application topically as needed. 30 g 2   ALPRAZolam (XANAX) 0.25 MG tablet Take 0.25 mg by mouth 2 (two) times daily as needed.     folic acid (FOLVITE) 1 MG tablet Take 1 tablet (1 mg total) by mouth daily. 30 tablet 4   metoprolol tartrate (LOPRESSOR) 50 MG tablet Take 50 mg by mouth 2 (two) times daily with a meal.     morphine (MS CONTIN) 30 MG 12 hr tablet Take 1 tablet (30 mg total) by mouth 3 (three) times daily as needed for pain. 60 tablet 0   oxyCODONE (OXY IR/ROXICODONE) 5 MG immediate release tablet Take 1 tablet (5 mg total) by mouth 3 (three) times daily as needed for severe pain. (Patient not taking: Reported on  03/01/2021) 30 tablet 0   polyethylene glycol (MIRALAX / GLYCOLAX) 17 g packet Take 17 g by mouth daily.     prochlorperazine (COMPAZINE) 10 MG tablet Take 1 tablet (10 mg total) by mouth every 6 (six) hours as needed for nausea or vomiting. 30 tablet 0   XARELTO 20 MG TABS tablet Take 20 mg by mouth daily.     No current facility-administered medications for this visit.    SURGICAL HISTORY:  Past Surgical History:  Procedure Laterality Date   CARDIOVERSION N/A 12/07/2018   Procedure: CARDIOVERSION;  Surgeon: Josue Hector, MD;  Location: [facility];  Service: Cardiovascular;  Laterality: N/A;   FINE NEEDLE ASPIRATION  10/30/2019   Procedure: FINE NEEDLE ASPIRATION (FNA) LINEAR;  Surgeon: Candee Furbish, MD;  Location: [facility] ENDOSCOPY;  Service: Pulmonary;;   INGUINAL HERNIA REPAIR Right 01/25/2019   Procedure: RIGHT INGUINAL HERNIA REPAIR WITH MESH;  Surgeon: Coralie Keens, MD;  Location: [facility];  Service: General;  Laterality: Right;  TAP BLOCK   INSERTION OF MESH Right 01/25/2019   Procedure: Insertion Of Mesh;  Surgeon: Coralie Keens, MD;  Location: [facility];  Service: General;  Laterality: Right;   VIDEO BRONCHOSCOPY WITH ENDOBRONCHIAL ULTRASOUND N/A 10/30/2019   Procedure: VIDEO BRONCHOSCOPY WITH ENDOBRONCHIAL ULTRASOUND;  Surgeon: Ina Homes  C, MD;  Location: [facility] ENDOSCOPY;  Service: Pulmonary;  Laterality: N/A;    REVIEW OF SYSTEMS:   Constitutional: Positive for fatigue.  Negative for chills, fever and unexpected weight change.  HENT: Negative for mouth sores, nosebleeds, sore throat and trouble swallowing.   Eyes: Negative for eye problems and icterus.  Respiratory: Positive for dyspnea on exertion.  Positive for mild cough. Negative for  hemoptysis and wheezing.   Cardiovascular: Negative for chest pain and leg swelling.  Gastrointestinal:  Negative for abdominal pain, diarrhea, constipation, nausea and vomiting.  Genitourinary: Negative for bladder incontinence, difficulty  urinating, dysuria, frequency and hematuria.   Musculoskeletal: Negative for back pain, gait problem, neck pain and neck stiffness.  Skin: Negative for itching and rash.  Neurological: Positive for generalized weakness. Negative for dizziness, extremity weakness, gait problem, headaches, light-headedness and seizures.  Hematological: Negative for adenopathy. Does not bruise/bleed easily.  Psychiatric/Behavioral: Negative for confusion, depression and sleep disturbance. The patient is not nervous/anxious.     PHYSICAL EXAMINATION:  Blood pressure (!) 146/91, pulse (!) 50, temperature (!) 97.3 F (36.3 C), temperature source Tympanic, weight 134 lb 4.8 oz (60.9 kg), SpO2 100 %.  ECOG PERFORMANCE STATUS: 2-3  Physical Exam  Constitutional: Oriented to person, place, and time and thin appearing male. No distress. HENT:  Head: Normocephalic and atraumatic.  Mouth/Throat: Oropharynx is clear and moist. No oropharyngeal exudate.  Eyes: Conjunctivae are normal. Right eye exhibits no discharge. Left eye exhibits no discharge. No scleral icterus.  Neck: Normal range of motion. Neck supple.  Cardiovascular: Bradycardic, regular rhythm, normal heart sounds and intact distal pulses.   Pulmonary/Chest: Effort normal and breath sounds normal. No respiratory distress. No wheezes. No rales.  Abdominal: Soft. Bowel sounds are normal. Exhibits no distension and no mass. There is no tenderness.  Musculoskeletal: Normal range of motion. Exhibits no edema.  Lymphadenopathy:    No cervical adenopathy.  Neurological: Alert and oriented to person, place, and time. Exhibits muscle wasting. Examined in the wheelchair.  Skin: Skin is warm and dry. No rash noted. Not diaphoretic. No erythema. No pallor.  Psychiatric: Mood, memory and judgment normal.  Vitals reviewed.  LABORATORY DATA: Lab Results  Component Value Date   WBC 3.7 (L) 04/22/2021   HGB 11.5 (L) 04/22/2021   HCT 35.5 (L) 04/22/2021   MCV 94.9  04/22/2021   PLT 208 04/22/2021      Chemistry      Component Value Date/Time   NA 132 (L) 04/22/2021 1105   NA 129 (L) 11/24/2018 1520   K 3.9 04/22/2021 1105   CL 101 04/22/2021 1105   CO2 20 (L) 04/22/2021 1105   BUN 9 04/22/2021 1105   BUN 8 11/24/2018 1520   CREATININE 0.66 04/22/2021 1105      Component Value Date/Time   CALCIUM 8.8 (L) 04/22/2021 1105   ALKPHOS 433 (H) 04/22/2021 1105   AST 108 (H) 04/22/2021 1105   ALT 80 (H) 04/22/2021 1105   BILITOT 3.0 (H) 04/22/2021 1105       RADIOGRAPHIC STUDIES:  CT Abdomen Pelvis W Contrast  Result Date: 03/30/2021 CLINICAL DATA:  Fall non-small cell lung cancer. EXAM: CT ABDOMEN AND PELVIS WITH CONTRAST TECHNIQUE: Multidetector CT imaging of the abdomen and pelvis was performed using the standard protocol following bolus administration of intravenous contrast. CONTRAST:  57m OMNIPAQUE IOHEXOL 350 MG/ML SOLN COMPARISON:  CT 01/12/2021 FINDINGS: Lower chest: Lung bases are clear. Small to moderate RIGHT pleural effusion increased from prior Hepatobiliary: Large  infiltrative mass in the LEFT hepatic lobe measures 7.4 x 4.5 cm compared with 8.9 by 6.1 cm. Mild biliary duct dilatation in the lateral segment of the LEFT hepatic lobe related to the mass. No change from prior. Several satellite lesions adjacent to the dominant mass. For example lesion central left hepatic lobe measuring 13 mm (image 15/2) compared to 16 mm no new lesions are present. Lesion in the inferior tip of the RIGHT hepatic lobe measuring 20 mm (image 30/5/coronal) compares to 20 mm. Periportal lymph nodes described on comparison damage decreased. For example lymph node in the gastrohepatic ligament measuring 9 mm short axis (image 21/2) decreased from 19 mm. No new adenopathy. Pancreas: Pancreas is normal. No ductal dilatation. No pancreatic inflammation. Spleen: Normal spleen Adrenals/urinary tract: Adrenal glands and kidneys are normal. The ureters and bladder  normal. Stomach/Bowel: Stomach, small bowel, and cecum are normal. The colon and rectosigmoid colon are normal. Moderate volume stool throughout the colon. Vascular/Lymphatic: Abdominal aorta is normal caliber. No periportal or retroperitoneal adenopathy. No pelvic adenopathy. Reproductive: Prostate unremarkable Other: No free fluid. Musculoskeletal: Sclerotic endplate changes with subchondral lucencies at L4-L5 are unchanged in favored chronic degenerative change. IMPRESSION: 1. Interval increase in small to moderate RIGHT pleural effusion. 2. Interval decrease in size of dominant mass in the LEFT hepatic lobe. No new lesions are present. 3. Interval decrease in size of periportal lymph nodes. 4. No evidence of metastatic carcinomatosis or peritoneal disease. 5. Moderate volume stool throughout the colon suggest constipation. Electronically Signed   By: Suzy Bouchard M.D.   On: 03/30/2021 19:05     ASSESSMENT/PLAN:  This is a very pleasant 70 year old Caucasian male diagnosed presently with metastatic non-small cell lung cancer which was initially diagnosed as a stage IIIa/B (T3, N1/N2, M0) non-small cell lung cancer, adenocarcinoma. He initially presented with a right upper lobe lung mass with direct extension across the major fissure into the superior segment of the right upper lobe and ipsilateral hilar lymphadenopathy as well as questionable subcarinal lymphadenopathy.  The patient had evidence of metastatic disease confirmed in June 2022.  The patient is positive for a K-ras G12C mutation which was used in the second line setting.   The patient initially underwent treatment with concurrent chemoradiation with weekly carboplatin for AUC of 2 and paclitaxel 45 mg per metered square.  He is status post 7 cycles.  He tolerated treatment well without any concerning adverse side effects except for mild sore throat. And a partial response to treatment.  He then underwent consolidation immunotherapy with  Imfinzi 1500 mg IV every 4 weeks.  He is status post 6 cycles.  He tolerated this treatment well but it was discontinued due to evidence of disease progression.  The patient had developed a suspicious liver lesion concerning for metastatic disease.  This was biopsied under ultrasound guidance and the final pathology was read at [facility] in addition to [facility] which showed no evidence of malignancy and there was suspicious inflammatory process of the biliary origin.  The patient has been on observation but the liver lesion continue to increase in size.  He underwent another ultrasound-guided biopsy of the liver. 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 His molecular studies from the liver biopsy showed no actionable mutations and it is unclear the discrepancy between the previous lung biopsy molecular study as well as the liver biopsy molecular studies except that the new lesion in the  liver is consistent with poorly differentiated squamous cell carcinoma compared to the initial lung biopsy that was adenocarcinoma at that time.   The patient was then started on second line targeted treatment with Lumakras 960 mg p.o. daily.  He was on this for 1 month but repeat imaging showed evidence of progressive disease in the liver.  Therefore, this was discontinued.  The patient is currently undergoing palliative systemic chemotherapy with carboplatin for an AUC of 5, Alimta 500 mg per metered squared, and Keytruda 200 mg IV every 3 weeks.  He is status post 4 cycles. Starting from today, cycle #5, the patient will begin maintenance alimta and [place]. We will continue to keep his dose of alimta at 400 mg/m2 which will hopefully improve his pancytopenia.   Labs were reviewed which show mild elevated LFTs. Reviewed with Dr. Julien Nordmann. We will hold [place] with this cycle of treatment. The patient  denies alcohol, tylenol, or other new medications use. He denies abdominal pain. We will monitor his labs closely weekly for resolution. If he has stability in his blood counts, moving forward, we will only perform labs once every 3 weeks on the day of treatment. Hopefully he will no longer require blood transfusions and he has significant improvement in his Hbg today; however, I will arrange for a sample to blood bank to be performed weekly just in case.   The patient will proceed with cycle 5 today as scheduled with single agent dose reduced alimta 400 mg/m2.   We will see him back for follow-up visit in 3 weeks for evaluation before starting cycle #6.     He will continue taking MS Contin for pain control.    Continue to follow with his primary care provider regarding his panic attacks.  He is scheduled for a port-a-cath placement on Friday. Will arrange flush appointments moving forward. I prescribed EMLA cream to his pharmacy and reviewed how to use this. Discussed with Dr. Julien Nordmann who believes it is ok to have the port-a-cath placed on 04/24/21 as scheduled.   The patient was advised to call immediately if he has any concerning symptoms in the interval. The patient voices understanding of current disease status and treatment options and is in agreement with the current care plan. All questions were answered. The patient knows to call the clinic with any problems, questions or concerns. We can certainly see the patient much sooner if necessary    Orders Placed This Encounter  Procedures   Sample to Blood Bank    Standing Status:   Standing    Number of Occurrences:   4    Standing Expiration Date:   04/22/2022     The total time spent in the appointment was 30-39 minutes.   Brayleigh Rybacki L Daulton Harbaugh, PA-C 04/22/21

## 2021-04-20 ENCOUNTER — Other Ambulatory Visit: Payer: Self-pay | Admitting: Medical Oncology

## 2021-04-20 ENCOUNTER — Telehealth (HOSPITAL_COMMUNITY): Payer: Self-pay

## 2021-04-20 DIAGNOSIS — I878 Other specified disorders of veins: Secondary | ICD-10-CM

## 2021-04-20 NOTE — Telephone Encounter (Signed)
Called to schedule port placement, no answer, left vm. AW  

## 2021-04-21 ENCOUNTER — Encounter: Payer: Self-pay | Admitting: Internal Medicine

## 2021-04-21 NOTE — Telephone Encounter (Signed)
Error

## 2021-04-22 ENCOUNTER — Inpatient Hospital Stay (HOSPITAL_BASED_OUTPATIENT_CLINIC_OR_DEPARTMENT_OTHER): Payer: Medicare Other | Admitting: Physician Assistant

## 2021-04-22 ENCOUNTER — Inpatient Hospital Stay: Payer: Medicare Other | Admitting: Dietician

## 2021-04-22 ENCOUNTER — Other Ambulatory Visit: Payer: Self-pay

## 2021-04-22 ENCOUNTER — Inpatient Hospital Stay: Payer: Medicare Other

## 2021-04-22 VITALS — BP 146/91 | HR 50 | Temp 97.3°F | Wt 134.3 lb

## 2021-04-22 DIAGNOSIS — Z5111 Encounter for antineoplastic chemotherapy: Secondary | ICD-10-CM

## 2021-04-22 DIAGNOSIS — D649 Anemia, unspecified: Secondary | ICD-10-CM

## 2021-04-22 DIAGNOSIS — C3491 Malignant neoplasm of unspecified part of right bronchus or lung: Secondary | ICD-10-CM

## 2021-04-22 DIAGNOSIS — C3490 Malignant neoplasm of unspecified part of unspecified bronchus or lung: Secondary | ICD-10-CM | POA: Diagnosis not present

## 2021-04-22 LAB — CBC WITH DIFFERENTIAL (CANCER CENTER ONLY)
Abs Immature Granulocytes: 0.02 10*3/uL (ref 0.00–0.07)
Basophils Absolute: 0 10*3/uL (ref 0.0–0.1)
Basophils Relative: 1 %
Eosinophils Absolute: 0 10*3/uL (ref 0.0–0.5)
Eosinophils Relative: 0 %
HCT: 35.5 % — ABNORMAL LOW (ref 39.0–52.0)
Hemoglobin: 11.5 g/dL — ABNORMAL LOW (ref 13.0–17.0)
Immature Granulocytes: 1 %
Lymphocytes Relative: 11 %
Lymphs Abs: 0.4 10*3/uL — ABNORMAL LOW (ref 0.7–4.0)
MCH: 30.7 pg (ref 26.0–34.0)
MCHC: 32.4 g/dL (ref 30.0–36.0)
MCV: 94.9 fL (ref 80.0–100.0)
Monocytes Absolute: 1.2 10*3/uL — ABNORMAL HIGH (ref 0.1–1.0)
Monocytes Relative: 32 %
Neutro Abs: 2 10*3/uL (ref 1.7–7.7)
Neutrophils Relative %: 55 %
Platelet Count: 208 10*3/uL (ref 150–400)
RBC: 3.74 MIL/uL — ABNORMAL LOW (ref 4.22–5.81)
RDW: 17.5 % — ABNORMAL HIGH (ref 11.5–15.5)
WBC Count: 3.7 10*3/uL — ABNORMAL LOW (ref 4.0–10.5)
nRBC: 0 % (ref 0.0–0.2)

## 2021-04-22 LAB — CMP (CANCER CENTER ONLY)
ALT: 80 U/L — ABNORMAL HIGH (ref 0–44)
AST: 108 U/L — ABNORMAL HIGH (ref 15–41)
Albumin: 3 g/dL — ABNORMAL LOW (ref 3.5–5.0)
Alkaline Phosphatase: 433 U/L — ABNORMAL HIGH (ref 38–126)
Anion gap: 11 (ref 5–15)
BUN: 9 mg/dL (ref 8–23)
CO2: 20 mmol/L — ABNORMAL LOW (ref 22–32)
Calcium: 8.8 mg/dL — ABNORMAL LOW (ref 8.9–10.3)
Chloride: 101 mmol/L (ref 98–111)
Creatinine: 0.66 mg/dL (ref 0.61–1.24)
GFR, Estimated: >60 mL/min (ref >60)
Glucose, Bld: 83 mg/dL (ref 70–99)
Potassium: 3.9 mmol/L (ref 3.5–5.1)
Sodium: 132 mmol/L — ABNORMAL LOW (ref 135–145)
Total Bilirubin: 3 mg/dL — ABNORMAL HIGH (ref 0.3–1.2)
Total Protein: 6.9 g/dL (ref 6.5–8.1)

## 2021-04-22 LAB — TSH: TSH: 2.264 u[IU]/mL (ref 0.320–4.118)

## 2021-04-22 LAB — SAMPLE TO BLOOD BANK

## 2021-04-22 MED ORDER — SODIUM CHLORIDE 0.9 % IV SOLN: Freq: Once | INTRAVENOUS | Status: AC

## 2021-04-22 MED ORDER — SODIUM CHLORIDE 0.9 % IV SOLN
400.0000 mg/m2 | Freq: Once | INTRAVENOUS | Status: AC
  Administered 2021-04-22: 700 mg via INTRAVENOUS
  Filled 2021-04-22: qty 20

## 2021-04-22 MED ORDER — LIDOCAINE-PRILOCAINE 2.5-2.5 % EX CREA: 1.0000 "application " | TOPICAL_CREAM | CUTANEOUS | 2 refills | Status: AC | PRN

## 2021-04-22 MED ORDER — PROCHLORPERAZINE MALEATE 10 MG PO TABS
10.0000 mg | ORAL_TABLET | Freq: Once | ORAL | Status: AC
  Administered 2021-04-22: 10 mg via ORAL
  Filled 2021-04-22: qty 1

## 2021-04-22 NOTE — Progress Notes (Signed)
Liver enzymes elevated.  MD aware.  No Keytruda today.

## 2021-04-22 NOTE — Progress Notes (Signed)
Nutrition Follow-up:  Patient receiving maintenance alimta/keytruda for metastatic lung cancer. Keytruda held this cycle due to elevated LFTs.   Patient eating a sandwich and cheetos this afternoon at visit. He reports strong appetite and eating a variety of foods. Wife reports patient could clear a half gallon of ice cream in a night. Patient and wife are looking forward to family coming to town for the Thanksgiving Holiday. He reports having more energy, denies nutrition impact symptoms.   Medications: reviewed  Labs: Na 132, AST 108, ALT 80, Alkaline Phosphatase 433  Anthropometrics: Weight 134 lb 4.8 oz today stable. Patient weighed 134 lb on 10/26 and 133 lb 4.8 oz on 10/4  9/27 - 132 lb 0.9 oz 8/24 - 132 lb 12.8 oz    MALNUTRITION DIAGNOSIS: Severe malnutrition ongoing   INTERVENTION:  Continue eating high calorie, high protein foods to promote weight gain   MONITORING, EVALUATION, GOAL: weight trends, intake   NEXT VISIT: Wednesday, December 7 during infusion

## 2021-04-22 NOTE — Patient Instructions (Signed)
[facility] ONCOLOGY  Discharge Instructions: Thank you for choosing [facility] to provide your oncology and hematology care.   If you have a lab appointment with the [facility], please go directly to the Center and check in at the registration area.   Wear comfortable clothing and clothing appropriate for easy access to any Portacath or PICC line.   We strive to give you quality time with your provider. You may need to reschedule your appointment if you arrive late (15 or more minutes).  Arriving late affects you and other patients whose appointments are after yours.  Also, if you miss three or more appointments without notifying the office, you may be dismissed from the clinic at the provider's discretion.      For prescription refill requests, have your pharmacy contact our office and allow 72 hours for refills to be completed.    Today you received the following chemotherapy and/or immunotherapy agents Alimta      To help prevent nausea and vomiting after your treatment, we encourage you to take your nausea medication as directed.  BELOW ARE SYMPTOMS THAT SHOULD BE REPORTED IMMEDIATELY: *FEVER GREATER THAN 100.4 F (38 C) OR HIGHER *CHILLS OR SWEATING *NAUSEA AND VOMITING THAT IS NOT CONTROLLED WITH YOUR NAUSEA MEDICATION *UNUSUAL SHORTNESS OF BREATH *UNUSUAL BRUISING OR BLEEDING *URINARY PROBLEMS (pain or burning when urinating, or frequent urination) *BOWEL PROBLEMS (unusual diarrhea, constipation, pain near the anus) TENDERNESS IN MOUTH AND THROAT WITH OR WITHOUT PRESENCE OF ULCERS (sore throat, sores in mouth, or a toothache) UNUSUAL RASH, SWELLING OR PAIN  UNUSUAL VAGINAL DISCHARGE OR ITCHING   Items with * indicate a potential emergency and should be followed up as soon as possible or go to the Emergency Department if any problems should occur.  Please show the CHEMOTHERAPY ALERT CARD or IMMUNOTHERAPY ALERT CARD at check-in to the  Emergency Department and triage nurse.  Should you have questions after your visit or need to cancel or reschedule your appointment, please contact [facility]  Dept: [phone]  and follow the prompts.  Office hours are 8:00 a.m. to 4:30 p.m. Monday - Friday. Please note that voicemails left after 4:00 p.m. may not be returned until the following business day.  We are closed weekends and major holidays. You have access to a nurse at all times for urgent questions. Please call the main number to the clinic Dept: [phone] and follow the prompts.   For any non-urgent questions, you may also contact your provider using MyChart. We now offer e-Visits for anyone 1 and older to request care online for non-urgent symptoms. For details visit [email or web address].   Also download the MyChart app! Go to the app store, search "MyChart", open the app, select [facility], and log in with your MyChart username and password.  Due to Covid, a mask is required upon entering the hospital/clinic. If you do not have a mask, one will be given to you upon arrival. For doctor visits, patients may have 1 support person aged 6 or older with them. For treatment visits, patients cannot have anyone with them due to current Covid guidelines and our immunocompromised population.

## 2021-04-23 ENCOUNTER — Other Ambulatory Visit: Payer: Self-pay | Admitting: Radiology

## 2021-04-23 ENCOUNTER — Telehealth: Payer: Self-pay | Admitting: Medical Oncology

## 2021-04-23 NOTE — Telephone Encounter (Signed)
Wife said to cancel port -he had chemo yesterday and is too tired to get port this week.

## 2021-04-24 ENCOUNTER — Telehealth: Payer: Self-pay | Admitting: Internal Medicine

## 2021-04-24 ENCOUNTER — Ambulatory Visit (HOSPITAL_COMMUNITY): Admission: RE | Admit: 2021-04-24 | Payer: Medicare Other | Source: Ambulatory Visit

## 2021-04-24 NOTE — Telephone Encounter (Signed)
Scheduled per 11/16 los, patient has been called and notified.

## 2021-04-27 ENCOUNTER — Other Ambulatory Visit: Payer: Self-pay | Admitting: Internal Medicine

## 2021-04-27 ENCOUNTER — Telehealth: Payer: Self-pay

## 2021-04-27 NOTE — Telephone Encounter (Signed)
Pts wife called to request a refill of Morphine to be sent to the pharmacy. She has called a few days in advance because of the holiday but will not pick it up until he needs it.  She also wanted [place] to know that pt c/o lower abdominal pain over the weekend and feeling some chest tightness while coughing up mucus. Pts wife started giving him Mucinex for the last 3 days and he has felt improvement because he is no long coughing as much and no longer feels his chest is tight.

## 2021-04-28 ENCOUNTER — Other Ambulatory Visit: Payer: Self-pay | Admitting: Internal Medicine

## 2021-04-28 DIAGNOSIS — G893 Neoplasm related pain (acute) (chronic): Secondary | ICD-10-CM

## 2021-04-28 MED ORDER — MORPHINE SULFATE ER 30 MG PO TBCR: 30.0000 mg | EXTENDED_RELEASE_TABLET | Freq: Three times a day (TID) | ORAL | 0 refills | Status: AC | PRN

## 2021-04-29 ENCOUNTER — Other Ambulatory Visit: Payer: Self-pay

## 2021-04-29 ENCOUNTER — Telehealth: Payer: Self-pay

## 2021-04-29 ENCOUNTER — Inpatient Hospital Stay: Payer: Medicare Other

## 2021-04-29 ENCOUNTER — Other Ambulatory Visit: Payer: Medicare Other | Admitting: *Deleted

## 2021-04-29 ENCOUNTER — Telehealth: Payer: Self-pay | Admitting: Medical Oncology

## 2021-04-29 DIAGNOSIS — Z515 Encounter for palliative care: Secondary | ICD-10-CM

## 2021-04-29 DIAGNOSIS — C3491 Malignant neoplasm of unspecified part of right bronchus or lung: Secondary | ICD-10-CM

## 2021-04-29 NOTE — Telephone Encounter (Signed)
Pt has decided to move forward with Hospice. I have submitted a referral to [facility] and called and spoke with Horris Latino to submit a verbal request as well.

## 2021-04-29 NOTE — Telephone Encounter (Signed)
Hospice-Wife called and said [facility] is ready for Hospice services. He said "I can't do the treatment anymore. " "Poor mobility, decline in eating -no longer interested in TV.Discolored on end of fingers. He did not know what his pills were for and then asked "is it time for my pills?"  Hospice referral placed.

## 2021-05-06 ENCOUNTER — Inpatient Hospital Stay: Payer: Medicare Other

## 2021-05-06 ENCOUNTER — Other Ambulatory Visit: Payer: Medicare Other

## 2021-05-07 DEATH — deceased

## 2021-05-08 ENCOUNTER — Ambulatory Visit (HOSPITAL_COMMUNITY): Payer: Medicare Other

## 2021-05-11 NOTE — Progress Notes (Signed)
[facility] RN NOTE  PATIENT NAME: Christopher Harding DOB: 1950-08-18 MRN: [id number]  PRIMARY CARE PROVIDER: Lujean Amel, MD  RESPONSIBLE PARTY: Soyla Dryer (wife) Acct ID - Guarantor Home Phone Work Phone Relationship Acct Type  [id number] [name], [id number] [phone]  Self P/F     [address] [place], [place], [place] [address]   Due to the COVID-19 crisis, this virtual check-in visit was done via telephone from my office and it was initiated and consent by this patient and or family.  PLAN OF CARE and INTERVENTION:  ADVANCE CARE PLANNING/GOALS OF CARE: Goal is for patient to remain in the home with his family. Wife is ready to transition patient to hospice for end-of-life.  PATIENT/CAREGIVER EDUCATION Explained hospice services DISEASE STATUS: Virtual check-in visit completed with patient's wife, Christopher Harding, via telephone. Patient can be heard in the background. Patient is opting to no longer receive chemotherapy due to side effects. He only has 2 infusions left but does not want them. He was supposed to go in for lab work today but was too weak to do so. He is not moving around well. He is only taking in bites and sips at this point. He is taking Morphine ER 30 mg TID and Xanax every 8 hours for symptom management, but wife says he still can not get settled or rest well. She is ready to transition patient to hospice care in the home. She currently has family members over for the holidays and is thankful she has their support. I contacted and left a message with Dr. Worthy Flank RN. She returned my call and stated that they have faxed over hospice orders this morning. Hospice admission visit and scheduled for 05/01/21 at [facility]. Wife is Patent attorney.   CODE STATUS: DNR ADVANCED DIRECTIVES: Y MOST FORM: yes PPS: 20%   (Duration of visit and documentation 20 minutes)   Daryl Eastern, RN BSN

## 2021-05-11 NOTE — Progress Notes (Signed)
[facility] RN NOTE  PATIENT NAME: Christopher Harding DOB: 1951-02-25 MRN: [id number]  PRIMARY CARE PROVIDER: Lujean Amel, MD  RESPONSIBLE PARTY: Soyla Dryer Acct ID - Guarantor Home Phone Work Phone Relationship Acct Type  [id number] DALEY, [phone]  Self P/F     [place], [place], [place] [address]   Covid-19 Pre-screening Negative  PLAN OF CARE and INTERVENTION:  ADVANCE CARE PLANNING/GOALS OF CARE: Goal is for patient to remain at home with his wife. Patient has a DNR and a MOST form. PATIENT/CAREGIVER EDUCATION: Explained palliative care services, symptom management DISEASE STATUS: Joint initial visit made with LCSW, M. Lonon. Met with patient and his wife, Christopher Harding, in their home. 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 He is alert and oriented x 4 and able to engage in appropriate conversation. He denies any pain or discomfort at this time. He continues to receive chemotherapy and immunotherapy infusions. He currently wants to continue treatments despite the fact he feels more fatigued and weak the first few days afterwards. He has a good appetite. He is on a soft diet. He denies dysphagia at this time and is taking his medications without difficulty. He is independent with ADLs. He is ambulatory without the use of assistive devices. He uses a wheelchair when travelling to appointments. He does become short of breath at times with exertion. He does have issues sleeping at night and often wakes up around 2am and is agitated and hungry. He is taking Xanax to help with these symptoms. They both agree to future visits from palliative care. He does not feel that he needs regular visits at this time, but knows we are available when needed.   HISTORY OF PRESENT ILLNESS:  This is a 70 yo male with a diagnosis of metastatic non-small cell lung cancer. Palliative care team was asked to follow patient for additional  support, goals of care and complex decision making.  CODE STATUS: DNR ADVANCED DIRECTIVES: Y MOST FORM: yes PPS: 50%   (Duration of visit and documentation 75 minutes)   Daryl Eastern, RN BSN

## 2021-05-13 ENCOUNTER — Ambulatory Visit: Payer: Medicare Other

## 2021-05-13 ENCOUNTER — Other Ambulatory Visit: Payer: Medicare Other

## 2021-05-13 ENCOUNTER — Ambulatory Visit: Payer: Medicare Other | Admitting: Internal Medicine

## 2021-05-13 ENCOUNTER — Encounter: Payer: Medicare Other | Admitting: Dietician

## 2021-05-18 ENCOUNTER — Ambulatory Visit: Payer: Medicare Other | Admitting: Student

## 2021-05-20 ENCOUNTER — Other Ambulatory Visit: Payer: Medicare Other

## 2021-05-27 ENCOUNTER — Other Ambulatory Visit: Payer: Medicare Other

## 2021-06-03 ENCOUNTER — Ambulatory Visit: Payer: Medicare Other | Admitting: Physician Assistant

## 2021-06-03 ENCOUNTER — Other Ambulatory Visit: Payer: Medicare Other

## 2021-06-03 ENCOUNTER — Ambulatory Visit: Payer: Medicare Other

## 2021-06-10 ENCOUNTER — Other Ambulatory Visit: Payer: Medicare Other

## 2021-06-17 ENCOUNTER — Other Ambulatory Visit: Payer: Medicare Other

## 2021-06-24 ENCOUNTER — Other Ambulatory Visit: Payer: Medicare Other

## 2021-06-24 ENCOUNTER — Ambulatory Visit: Payer: Medicare Other | Admitting: Internal Medicine

## 2021-06-24 ENCOUNTER — Ambulatory Visit: Payer: Medicare Other

## 2021-06-28 ENCOUNTER — Other Ambulatory Visit: Payer: Self-pay | Admitting: Internal Medicine

## 2021-06-28 DIAGNOSIS — Z5111 Encounter for antineoplastic chemotherapy: Secondary | ICD-10-CM

## 2021-06-28 DIAGNOSIS — R112 Nausea with vomiting, unspecified: Secondary | ICD-10-CM

## 2023-04-27 NOTE — Telephone Encounter (Signed)
Telephone call  

## 2024-05-11 ENCOUNTER — Encounter (HOSPITAL_COMMUNITY): Payer: Self-pay | Admitting: Surgery
